# Patient Record
Sex: Male | Born: 1990 | Race: Black or African American | Hispanic: No | Marital: Single | Smoking: Former smoker
Health system: Southern US, Community
[De-identification: ages and names within clinical notes are randomized; demographics above are authoritative.]

## PROBLEM LIST (undated history)

## (undated) ENCOUNTER — Emergency Department (HOSPITAL_COMMUNITY): Admission: EM | Payer: No Typology Code available for payment source

## (undated) ENCOUNTER — Emergency Department (HOSPITAL_COMMUNITY): Admission: EM | Discharge status: Against Medical Advice | Payer: Medicaid Other

## (undated) DIAGNOSIS — J45909 Unspecified asthma, uncomplicated: Secondary | ICD-10-CM

## (undated) DIAGNOSIS — Z87828 Personal history of other (healed) physical injury and trauma: Secondary | ICD-10-CM

## (undated) DIAGNOSIS — E119 Type 2 diabetes mellitus without complications: Secondary | ICD-10-CM

## (undated) DIAGNOSIS — I10 Essential (primary) hypertension: Secondary | ICD-10-CM

## (undated) DIAGNOSIS — T148XXA Other injury of unspecified body region, initial encounter: Secondary | ICD-10-CM

## (undated) DIAGNOSIS — W3400XA Accidental discharge from unspecified firearms or gun, initial encounter: Secondary | ICD-10-CM

## (undated) DIAGNOSIS — H409 Unspecified glaucoma: Secondary | ICD-10-CM

## (undated) DIAGNOSIS — S065X9A Traumatic subdural hemorrhage with loss of consciousness of unspecified duration, initial encounter: Secondary | ICD-10-CM

## (undated) DIAGNOSIS — G35 Multiple sclerosis: Secondary | ICD-10-CM

---
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
## 2011-04-25 LAB — GLUCOSE, CAPILLARY: Glucose-Capillary: 94

## 2011-04-26 LAB — RAPID STREP SCREEN (MED CTR MEBANE ONLY): Streptococcus, Group A Screen (Direct): NEGATIVE

## 2012-04-26 ENCOUNTER — Encounter (HOSPITAL_COMMUNITY): Payer: Self-pay | Admitting: *Deleted

## 2012-04-26 ENCOUNTER — Emergency Department (HOSPITAL_COMMUNITY)
Admission: EM | Admit: 2012-04-26 | Discharge: 2012-04-26 | Discharge status: Against Medical Advice | Payer: Medicaid Other | Attending: Emergency Medicine | Admitting: Emergency Medicine

## 2012-04-26 DIAGNOSIS — R209 Unspecified disturbances of skin sensation: Secondary | ICD-10-CM | POA: Insufficient documentation

## 2012-04-26 DIAGNOSIS — S51809A Unspecified open wound of unspecified forearm, initial encounter: Secondary | ICD-10-CM | POA: Insufficient documentation

## 2012-04-26 DIAGNOSIS — E119 Type 2 diabetes mellitus without complications: Secondary | ICD-10-CM | POA: Insufficient documentation

## 2012-04-26 DIAGNOSIS — IMO0002 Reserved for concepts with insufficient information to code with codable children: Secondary | ICD-10-CM

## 2012-04-26 MED ORDER — LIDOCAINE-EPINEPHRINE-TETRACAINE (LET) SOLUTION
3.0000 mL | Freq: Once | NASAL | Status: DC
  Filled 2012-04-26: qty 3

## 2012-04-26 NOTE — ED Provider Notes (Signed)
Medical screening examination/treatment/procedure(s) were performed by non-physician practitioner and as supervising physician I was immediately available for consultation/collaboration.   Vickye Astorino Y. Gavino Fouch, MD 04/26/12 1411 
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
## 2012-04-26 NOTE — ED Notes (Signed)
Pt walked out of room. Stating he does not want to wait for papers. Provider aware. Pt amb without problem. Left with no signs of distress.
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
## 2012-04-30 ENCOUNTER — Encounter (HOSPITAL_COMMUNITY): Payer: Self-pay | Admitting: *Deleted

## 2012-04-30 ENCOUNTER — Emergency Department (INDEPENDENT_AMBULATORY_CARE_PROVIDER_SITE_OTHER)
Admission: EM | Admit: 2012-04-30 | Discharge: 2012-04-30 | Disposition: A | Discharge status: Home / Self Care | Payer: Medicaid Other | Source: Home / Self Care | Attending: Family Medicine | Admitting: Family Medicine

## 2012-04-30 ENCOUNTER — Emergency Department (HOSPITAL_COMMUNITY)
Admission: EM | Admit: 2012-04-30 | Discharge: 2012-04-30 | Discharge status: Absent without leave | Payer: Self-pay | Source: Home / Self Care

## 2012-04-30 DIAGNOSIS — S41109A Unspecified open wound of unspecified upper arm, initial encounter: Secondary | ICD-10-CM

## 2012-04-30 DIAGNOSIS — S41119A Laceration without foreign body of unspecified upper arm, initial encounter: Secondary | ICD-10-CM

## 2012-04-30 HISTORY — DX: Essential (primary) hypertension: I10

## 2012-04-30 HISTORY — DX: Unspecified glaucoma: H40.9

## 2012-04-30 NOTE — ED Notes (Signed)
Skin flap from avulsion removed per Dr. Artis Flock after local anesthetic administration.
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
## 2012-05-03 ENCOUNTER — Encounter (HOSPITAL_COMMUNITY): Payer: Self-pay | Admitting: Radiology

## 2012-05-03 ENCOUNTER — Emergency Department (HOSPITAL_COMMUNITY): Payer: Medicaid Other

## 2012-05-03 ENCOUNTER — Other Ambulatory Visit: Payer: Self-pay

## 2012-05-03 ENCOUNTER — Emergency Department (HOSPITAL_COMMUNITY)
Admission: EM | Admit: 2012-05-03 | Discharge: 2012-05-04 | Disposition: A | Discharge status: Home / Self Care | Payer: Medicaid Other | Attending: Emergency Medicine | Admitting: Emergency Medicine

## 2012-05-03 DIAGNOSIS — M542 Cervicalgia: Secondary | ICD-10-CM | POA: Insufficient documentation

## 2012-05-03 DIAGNOSIS — M549 Dorsalgia, unspecified: Secondary | ICD-10-CM | POA: Insufficient documentation

## 2012-05-03 DIAGNOSIS — Z23 Encounter for immunization: Secondary | ICD-10-CM | POA: Insufficient documentation

## 2012-05-03 DIAGNOSIS — E119 Type 2 diabetes mellitus without complications: Secondary | ICD-10-CM | POA: Insufficient documentation

## 2012-05-03 DIAGNOSIS — I10 Essential (primary) hypertension: Secondary | ICD-10-CM | POA: Insufficient documentation

## 2012-05-03 DIAGNOSIS — M25519 Pain in unspecified shoulder: Secondary | ICD-10-CM | POA: Insufficient documentation

## 2012-05-03 DIAGNOSIS — T07XXXA Unspecified multiple injuries, initial encounter: Secondary | ICD-10-CM

## 2012-05-03 LAB — GLUCOSE, CAPILLARY: Glucose-Capillary: 68 mg/dL — ABNORMAL LOW (ref 70–99)

## 2012-05-03 LAB — COMPREHENSIVE METABOLIC PANEL
ALT: 19 U/L (ref 0–53)
AST: 24 U/L (ref 0–37)
Albumin: 4.6 g/dL (ref 3.5–5.2)
CO2: 26 mEq/L (ref 19–32)
Chloride: 103 mEq/L (ref 96–112)
Glucose, Bld: 77 mg/dL (ref 70–99)
Total Protein: 8.1 g/dL (ref 6.0–8.3)

## 2012-05-03 LAB — POCT I-STAT, CHEM 8
Calcium, Ion: 1.16 mmol/L (ref 1.12–1.23)
Creatinine, Ser: 1.2 mg/dL (ref 0.50–1.35)
Glucose, Bld: 78 mg/dL (ref 70–99)
Hemoglobin: 18.4 g/dL — ABNORMAL HIGH (ref 13.0–17.0)
Sodium: 142 mEq/L (ref 135–145)
TCO2: 28 mmol/L (ref 0–100)

## 2012-05-03 LAB — PROTIME-INR
INR: 1.28 (ref 0.00–1.49)
Prothrombin Time: 15.7 seconds — ABNORMAL HIGH (ref 11.6–15.2)

## 2012-05-03 LAB — CBC
MCH: 25.6 pg — ABNORMAL LOW (ref 26.0–34.0)
MCV: 77.6 fL — ABNORMAL LOW (ref 78.0–100.0)
RDW: 13.9 % (ref 11.5–15.5)
WBC: 13.6 10*3/uL — ABNORMAL HIGH (ref 4.0–10.5)

## 2012-05-03 LAB — SAMPLE TO BLOOD BANK

## 2012-05-03 LAB — LACTIC ACID, PLASMA: Lactic Acid, Venous: 1.5 mmol/L (ref 0.5–2.2)

## 2012-05-03 MED ORDER — TETANUS-DIPHTHERIA TOXOIDS TD 5-2 LFU IM INJ
0.5000 mL | INJECTION | Freq: Once | INTRAMUSCULAR | Status: AC
  Administered 2012-05-03: 0.5 mL via INTRAMUSCULAR
  Filled 2012-05-03: qty 0.5

## 2012-05-03 MED ORDER — CEPHALEXIN 500 MG PO CAPS: 500.0000 mg | ORAL_CAPSULE | Freq: Two times a day (BID) | ORAL | Status: AC

## 2012-05-03 MED ORDER — ONDANSETRON HCL 4 MG/2ML IJ SOLN
4.0000 mg | Freq: Once | INTRAMUSCULAR | Status: AC
  Administered 2012-05-03: 4 mg via INTRAVENOUS
  Filled 2012-05-03: qty 2

## 2012-05-03 MED ORDER — IOHEXOL 300 MG/ML  SOLN
80.0000 mL | Freq: Once | INTRAMUSCULAR | Status: AC | PRN
  Administered 2012-05-03: 80 mL via INTRAVENOUS

## 2012-05-03 MED ORDER — TRAMADOL HCL 50 MG PO TABS: 50.0000 mg | ORAL_TABLET | Freq: Four times a day (QID) | ORAL | Status: DC | PRN

## 2012-05-03 MED ORDER — FENTANYL CITRATE 0.05 MG/ML IJ SOLN
25.0000 ug | Freq: Once | INTRAMUSCULAR | Status: AC
  Administered 2012-05-03: 25 ug via INTRAVENOUS
  Filled 2012-05-03: qty 2

## 2012-05-03 MED ORDER — CEFAZOLIN SODIUM 1-5 GM-% IV SOLN
1.0000 g | Freq: Once | INTRAVENOUS | Status: AC
  Administered 2012-05-03: 1 g via INTRAVENOUS
  Filled 2012-05-03: qty 50

## 2012-05-03 NOTE — ED Notes (Signed)
RN assisted pt to CT

## 2012-05-03 NOTE — Progress Notes (Signed)
Responded to trauma page. Determined that chaplain support was not needed at this time.
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
## 2012-05-04 LAB — CDS SEROLOGY

## 2012-05-05 ENCOUNTER — Emergency Department (INDEPENDENT_AMBULATORY_CARE_PROVIDER_SITE_OTHER)
Admission: EM | Admit: 2012-05-05 | Discharge: 2012-05-05 | Disposition: A | Discharge status: Home / Self Care | Payer: Medicaid Other | Source: Home / Self Care

## 2012-05-05 ENCOUNTER — Encounter (HOSPITAL_COMMUNITY): Payer: Self-pay | Admitting: *Deleted

## 2012-05-05 DIAGNOSIS — S21209A Unspecified open wound of unspecified back wall of thorax without penetration into thoracic cavity, initial encounter: Secondary | ICD-10-CM

## 2012-05-05 DIAGNOSIS — S21219A Laceration without foreign body of unspecified back wall of thorax without penetration into thoracic cavity, initial encounter: Secondary | ICD-10-CM

## 2012-05-05 HISTORY — DX: Other injury of unspecified body region, initial encounter: T14.8XXA

## 2012-05-05 MED ORDER — HYDROCODONE-ACETAMINOPHEN 5-325 MG PO TABS: 2.0000 | ORAL_TABLET | ORAL | Status: DC | PRN
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
## 2012-05-05 NOTE — ED Provider Notes (Signed)
Medical screening examination/treatment/procedure(s) were performed by resident physician or non-physician practitioner and as supervising physician I was immediately available for consultation/collaboration.   Bless Lisenby DOUGLAS MD.    Dhilan Brauer D Aviyanna Colbaugh, MD 05/05/12 1737 
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
## 2012-07-26 ENCOUNTER — Emergency Department (HOSPITAL_COMMUNITY)
Admission: EM | Admit: 2012-07-26 | Discharge: 2012-07-26 | Disposition: A | Discharge status: Home / Self Care | Payer: Medicaid Other | Attending: Emergency Medicine | Admitting: Emergency Medicine

## 2012-07-26 ENCOUNTER — Encounter (HOSPITAL_COMMUNITY): Payer: Self-pay | Admitting: Adult Health

## 2012-07-26 DIAGNOSIS — IMO0002 Reserved for concepts with insufficient information to code with codable children: Secondary | ICD-10-CM

## 2012-07-26 DIAGNOSIS — W268XXA Contact with other sharp object(s), not elsewhere classified, initial encounter: Secondary | ICD-10-CM | POA: Insufficient documentation

## 2012-07-26 DIAGNOSIS — F17200 Nicotine dependence, unspecified, uncomplicated: Secondary | ICD-10-CM | POA: Insufficient documentation

## 2012-07-26 DIAGNOSIS — E119 Type 2 diabetes mellitus without complications: Secondary | ICD-10-CM | POA: Insufficient documentation

## 2012-07-26 DIAGNOSIS — S61409A Unspecified open wound of unspecified hand, initial encounter: Secondary | ICD-10-CM | POA: Insufficient documentation

## 2012-07-26 DIAGNOSIS — Y929 Unspecified place or not applicable: Secondary | ICD-10-CM | POA: Insufficient documentation

## 2012-07-26 DIAGNOSIS — Y9339 Activity, other involving climbing, rappelling and jumping off: Secondary | ICD-10-CM | POA: Insufficient documentation

## 2012-07-26 DIAGNOSIS — I10 Essential (primary) hypertension: Secondary | ICD-10-CM | POA: Insufficient documentation

## 2012-07-26 DIAGNOSIS — Z79899 Other long term (current) drug therapy: Secondary | ICD-10-CM | POA: Insufficient documentation

## 2012-07-26 MED ORDER — IBUPROFEN 800 MG PO TABS
800.0000 mg | ORAL_TABLET | Freq: Once | ORAL | Status: AC
  Administered 2012-07-26: 800 mg via ORAL
  Filled 2012-07-26: qty 1

## 2012-07-26 MED ORDER — CEPHALEXIN 500 MG PO CAPS: 500.0000 mg | ORAL_CAPSULE | Freq: Four times a day (QID) | ORAL | Status: DC
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
## 2013-04-03 ENCOUNTER — Encounter (HOSPITAL_COMMUNITY): Payer: Self-pay | Admitting: Emergency Medicine

## 2013-04-03 ENCOUNTER — Emergency Department (HOSPITAL_COMMUNITY)
Admission: EM | Admit: 2013-04-03 | Discharge: 2013-04-04 | Disposition: A | Discharge status: Home / Self Care | Payer: Medicaid Other | Attending: Emergency Medicine | Admitting: Emergency Medicine

## 2013-04-03 DIAGNOSIS — F17200 Nicotine dependence, unspecified, uncomplicated: Secondary | ICD-10-CM | POA: Insufficient documentation

## 2013-04-03 DIAGNOSIS — I10 Essential (primary) hypertension: Secondary | ICD-10-CM | POA: Insufficient documentation

## 2013-04-03 DIAGNOSIS — Z8669 Personal history of other diseases of the nervous system and sense organs: Secondary | ICD-10-CM | POA: Insufficient documentation

## 2013-04-03 DIAGNOSIS — E119 Type 2 diabetes mellitus without complications: Secondary | ICD-10-CM | POA: Insufficient documentation

## 2013-04-03 DIAGNOSIS — Z87828 Personal history of other (healed) physical injury and trauma: Secondary | ICD-10-CM | POA: Insufficient documentation

## 2013-04-03 DIAGNOSIS — M25569 Pain in unspecified knee: Secondary | ICD-10-CM | POA: Insufficient documentation

## 2013-04-03 DIAGNOSIS — Z79899 Other long term (current) drug therapy: Secondary | ICD-10-CM | POA: Insufficient documentation

## 2013-04-03 DIAGNOSIS — R531 Weakness: Secondary | ICD-10-CM

## 2013-04-03 DIAGNOSIS — M25561 Pain in right knee: Secondary | ICD-10-CM

## 2013-04-03 MED ORDER — IBUPROFEN 600 MG PO TABS: 600.0000 mg | ORAL_TABLET | Freq: Three times a day (TID) | ORAL | Status: DC | PRN

## 2013-04-03 NOTE — ED Notes (Signed)
Physician d/c patient but patient didn't wait around to sign documentation, patient wanted medication other than ibuprofen 600 mg,
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
## 2013-04-27 ENCOUNTER — Emergency Department (HOSPITAL_COMMUNITY)
Admission: EM | Admit: 2013-04-27 | Discharge: 2013-04-27 | Disposition: A | Discharge status: Home / Self Care | Payer: Medicaid Other | Attending: Emergency Medicine | Admitting: Emergency Medicine

## 2013-04-27 ENCOUNTER — Encounter (HOSPITAL_COMMUNITY): Payer: Self-pay | Admitting: *Deleted

## 2013-04-27 DIAGNOSIS — F17200 Nicotine dependence, unspecified, uncomplicated: Secondary | ICD-10-CM | POA: Insufficient documentation

## 2013-04-27 DIAGNOSIS — Z8669 Personal history of other diseases of the nervous system and sense organs: Secondary | ICD-10-CM | POA: Insufficient documentation

## 2013-04-27 DIAGNOSIS — E119 Type 2 diabetes mellitus without complications: Secondary | ICD-10-CM | POA: Insufficient documentation

## 2013-04-27 DIAGNOSIS — Z79899 Other long term (current) drug therapy: Secondary | ICD-10-CM | POA: Insufficient documentation

## 2013-04-27 DIAGNOSIS — IMO0001 Reserved for inherently not codable concepts without codable children: Secondary | ICD-10-CM | POA: Insufficient documentation

## 2013-04-27 DIAGNOSIS — I10 Essential (primary) hypertension: Secondary | ICD-10-CM | POA: Insufficient documentation

## 2013-04-27 DIAGNOSIS — M25561 Pain in right knee: Secondary | ICD-10-CM

## 2013-04-27 DIAGNOSIS — Z87828 Personal history of other (healed) physical injury and trauma: Secondary | ICD-10-CM | POA: Insufficient documentation

## 2013-04-27 DIAGNOSIS — G8911 Acute pain due to trauma: Secondary | ICD-10-CM | POA: Insufficient documentation

## 2013-04-27 DIAGNOSIS — M25569 Pain in unspecified knee: Secondary | ICD-10-CM | POA: Insufficient documentation

## 2013-04-27 MED ORDER — MELOXICAM 7.5 MG PO TABS: 15.0000 mg | ORAL_TABLET | Freq: Every day | ORAL | Status: DC

## 2013-04-27 MED ORDER — KETOROLAC TROMETHAMINE 60 MG/2ML IM SOLN
60.0000 mg | Freq: Once | INTRAMUSCULAR | Status: AC
  Administered 2013-04-27: 60 mg via INTRAMUSCULAR
  Filled 2013-04-27: qty 2
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
## 2013-04-28 NOTE — ED Provider Notes (Signed)
Medical screening examination/treatment/procedure(s) were performed by non-physician practitioner and as supervising physician I was immediately available for consultation/collaboration.   [employer]an Base M Justice Milliron, MD 04/28/13 1239 

## 2013-09-22 DIAGNOSIS — W3400XA Accidental discharge from unspecified firearms or gun, initial encounter: Secondary | ICD-10-CM

## 2013-09-22 HISTORY — PX: FEMORAL-POPLITEAL BYPASS GRAFT: SHX937

## 2013-09-22 HISTORY — PX: FASCIOTOMY: SHX132

## 2013-09-22 HISTORY — DX: Accidental discharge from unspecified firearms or gun, initial encounter: W34.00XA

## 2013-09-22 HISTORY — PX: FASCIOTOMY CLOSURE: SHX5829

## 2013-09-24 ENCOUNTER — Inpatient Hospital Stay (HOSPITAL_COMMUNITY)
Admission: EM | Admit: 2013-09-24 | Discharge: 2013-10-03 | DRG: 908 | Disposition: A | Discharge status: Home / Self Care | Payer: Medicaid Other | Attending: General Surgery | Admitting: General Surgery

## 2013-09-24 ENCOUNTER — Encounter (HOSPITAL_COMMUNITY): Payer: Self-pay | Admitting: Emergency Medicine

## 2013-09-24 ENCOUNTER — Encounter (HOSPITAL_COMMUNITY): Admission: EM | Disposition: A | Discharge status: Home / Self Care | Payer: Self-pay | Source: Home / Self Care

## 2013-09-24 ENCOUNTER — Inpatient Hospital Stay (HOSPITAL_COMMUNITY): Payer: Medicaid Other | Admitting: Anesthesiology

## 2013-09-24 ENCOUNTER — Emergency Department (HOSPITAL_COMMUNITY): Payer: Medicaid Other

## 2013-09-24 ENCOUNTER — Encounter (HOSPITAL_COMMUNITY): Payer: Medicaid Other | Admitting: Anesthesiology

## 2013-09-24 DIAGNOSIS — S71109A Unspecified open wound, unspecified thigh, initial encounter: Secondary | ICD-10-CM

## 2013-09-24 DIAGNOSIS — D62 Acute posthemorrhagic anemia: Secondary | ICD-10-CM | POA: Diagnosis not present

## 2013-09-24 DIAGNOSIS — F17200 Nicotine dependence, unspecified, uncomplicated: Secondary | ICD-10-CM | POA: Diagnosis present

## 2013-09-24 DIAGNOSIS — E119 Type 2 diabetes mellitus without complications: Secondary | ICD-10-CM | POA: Diagnosis present

## 2013-09-24 DIAGNOSIS — W3400XA Accidental discharge from unspecified firearms or gun, initial encounter: Secondary | ICD-10-CM

## 2013-09-24 DIAGNOSIS — S75009A Unspecified injury of femoral artery, unspecified leg, initial encounter: Principal | ICD-10-CM | POA: Diagnosis present

## 2013-09-24 DIAGNOSIS — F209 Schizophrenia, unspecified: Secondary | ICD-10-CM | POA: Diagnosis present

## 2013-09-24 DIAGNOSIS — T148XXA Other injury of unspecified body region, initial encounter: Secondary | ICD-10-CM | POA: Diagnosis present

## 2013-09-24 DIAGNOSIS — S71009A Unspecified open wound, unspecified hip, initial encounter: Secondary | ICD-10-CM

## 2013-09-24 DIAGNOSIS — F1210 Cannabis abuse, uncomplicated: Secondary | ICD-10-CM | POA: Diagnosis present

## 2013-09-24 DIAGNOSIS — G5791 Unspecified mononeuropathy of right lower limb: Secondary | ICD-10-CM | POA: Diagnosis present

## 2013-09-24 DIAGNOSIS — I10 Essential (primary) hypertension: Secondary | ICD-10-CM | POA: Diagnosis present

## 2013-09-24 HISTORY — PX: FEMORAL-POPLITEAL BYPASS GRAFT: SHX937

## 2013-09-24 HISTORY — PX: FASCIOTOMY: SHX132

## 2013-09-24 HISTORY — DX: Accidental discharge from unspecified firearms or gun, initial encounter: W34.00XA

## 2013-09-24 LAB — PROTIME-INR
INR: 1.48 (ref 0.00–1.49)
Prothrombin Time: 17.5 seconds — ABNORMAL HIGH (ref 11.6–15.2)

## 2013-09-24 LAB — CBG MONITORING, ED: Glucose-Capillary: 106 mg/dL — ABNORMAL HIGH (ref 70–99)

## 2013-09-24 LAB — CBC
HCT: 44.4 % (ref 39.0–52.0)
Hemoglobin: 15 g/dL (ref 13.0–17.0)
MCH: 26.6 pg (ref 26.0–34.0)
MCHC: 33.8 g/dL (ref 30.0–36.0)
MCV: 78.9 fL (ref 78.0–100.0)
PLATELETS: 345 10*3/uL (ref 150–400)
RBC: 5.63 MIL/uL (ref 4.22–5.81)
RDW: 13.5 % (ref 11.5–15.5)
WBC: 13.3 10*3/uL — ABNORMAL HIGH (ref 4.0–10.5)

## 2013-09-24 LAB — COMPREHENSIVE METABOLIC PANEL
ALBUMIN: 4.1 g/dL (ref 3.5–5.2)
ALK PHOS: 59 U/L (ref 39–117)
ALT: 16 U/L (ref 0–53)
AST: 24 U/L (ref 0–37)
BILIRUBIN TOTAL: 0.4 mg/dL (ref 0.3–1.2)
BUN: 9 mg/dL (ref 6–23)
CHLORIDE: 105 meq/L (ref 96–112)
CO2: 23 meq/L (ref 19–32)
Calcium: 9.2 mg/dL (ref 8.4–10.5)
Creatinine, Ser: 1.19 mg/dL (ref 0.50–1.35)
GFR calc Af Amer: >90 mL/min (ref >90)
GFR calc non Af Amer: 86 mL/min — ABNORMAL LOW (ref >90)
Glucose, Bld: 134 mg/dL — ABNORMAL HIGH (ref 70–99)
POTASSIUM: 3.9 meq/L (ref 3.7–5.3)
Sodium: 144 mEq/L (ref 137–147)
Total Protein: 7.2 g/dL (ref 6.0–8.3)

## 2013-09-24 LAB — CDS SEROLOGY

## 2013-09-24 LAB — SAMPLE TO BLOOD BANK

## 2013-09-24 SURGERY — BYPASS GRAFT FEMORAL-POPLITEAL ARTERY
Anesthesia: General | Site: Leg Upper | Laterality: Right

## 2013-09-24 MED ORDER — SODIUM CHLORIDE 0.9 % IV SOLN
1000.0000 mL | INTRAVENOUS | Status: DC
  Administered 2013-09-24: 1000 mL via INTRAVENOUS

## 2013-09-24 MED ORDER — NEOSTIGMINE METHYLSULFATE 1 MG/ML IJ SOLN
INTRAMUSCULAR | Status: AC
  Filled 2013-09-24: qty 10

## 2013-09-24 MED ORDER — ONDANSETRON HCL 4 MG/2ML IJ SOLN: 4.0000 mg | Freq: Four times a day (QID) | INTRAMUSCULAR | Status: DC | PRN

## 2013-09-24 MED ORDER — HEPARIN SODIUM (PORCINE) 5000 UNIT/ML IJ SOLN
INTRAMUSCULAR | Status: DC | PRN
  Administered 2013-09-24: 22:00:00

## 2013-09-24 MED ORDER — MIDAZOLAM HCL 5 MG/5ML IJ SOLN
INTRAMUSCULAR | Status: DC | PRN
  Administered 2013-09-24: 2 mg via INTRAVENOUS

## 2013-09-24 MED ORDER — ARTIFICIAL TEARS OP OINT
TOPICAL_OINTMENT | OPHTHALMIC | Status: DC | PRN
  Administered 2013-09-24: 1 via OPHTHALMIC

## 2013-09-24 MED ORDER — 0.9 % SODIUM CHLORIDE (POUR BTL) OPTIME
TOPICAL | Status: DC | PRN
  Administered 2013-09-24: 1000 mL

## 2013-09-24 MED ORDER — SODIUM CHLORIDE 0.9 % IV SOLN
INTRAVENOUS | Status: DC | PRN
  Administered 2013-09-24: 22:00:00 via INTRAVENOUS

## 2013-09-24 MED ORDER — DEXTROSE 5 % IV SOLN
INTRAVENOUS | Status: DC | PRN
  Administered 2013-09-24: 22:00:00 via INTRAVENOUS

## 2013-09-24 MED ORDER — PHENYLEPHRINE 40 MCG/ML (10ML) SYRINGE FOR IV PUSH (FOR BLOOD PRESSURE SUPPORT)
PREFILLED_SYRINGE | INTRAVENOUS | Status: AC
  Filled 2013-09-24: qty 10

## 2013-09-24 MED ORDER — HYDROMORPHONE 0.3 MG/ML IV SOLN
INTRAVENOUS | Status: DC
  Administered 2013-09-25: 03:00:00 via INTRAVENOUS
  Administered 2013-09-25: 2.7 mg via INTRAVENOUS
  Administered 2013-09-25: 18:00:00 via INTRAVENOUS
  Administered 2013-09-25: 1.5 mg via INTRAVENOUS
  Administered 2013-09-25: 2.1 mg via INTRAVENOUS
  Administered 2013-09-25: 0.9 mg via INTRAVENOUS
  Administered 2013-09-26: 3.9 mg via INTRAVENOUS
  Administered 2013-09-26: 05:00:00 via INTRAVENOUS
  Administered 2013-09-26: 2.1 mg via INTRAVENOUS
  Filled 2013-09-24 (×2): qty 25

## 2013-09-24 MED ORDER — CEFAZOLIN SODIUM-DEXTROSE 2-3 GM-% IV SOLR
INTRAVENOUS | Status: DC | PRN
  Administered 2013-09-24: 2 g via INTRAVENOUS

## 2013-09-24 MED ORDER — LACTATED RINGERS IV SOLN
INTRAVENOUS | Status: DC | PRN
  Administered 2013-09-24 – 2013-09-25 (×2): via INTRAVENOUS

## 2013-09-24 MED ORDER — SODIUM CHLORIDE 0.9 % IV SOLN
10.0000 mg | INTRAVENOUS | Status: DC | PRN
  Administered 2013-09-24: 15 ug/min via INTRAVENOUS

## 2013-09-24 MED ORDER — SODIUM CHLORIDE 0.9 % IJ SOLN: 9.0000 mL | INTRAMUSCULAR | Status: DC | PRN

## 2013-09-24 MED ORDER — LIDOCAINE HCL (CARDIAC) 20 MG/ML IV SOLN
INTRAVENOUS | Status: AC
  Filled 2013-09-24: qty 5

## 2013-09-24 MED ORDER — FENTANYL CITRATE 0.05 MG/ML IJ SOLN
50.0000 ug | Freq: Once | INTRAMUSCULAR | Status: AC
  Administered 2013-09-24: 50 ug via INTRAVENOUS
  Filled 2013-09-24: qty 2

## 2013-09-24 MED ORDER — PROPOFOL 10 MG/ML IV BOLUS
INTRAVENOUS | Status: DC | PRN
  Administered 2013-09-24: 200 mg via INTRAVENOUS

## 2013-09-24 MED ORDER — ALBUMIN HUMAN 5 % IV SOLN
INTRAVENOUS | Status: DC | PRN
  Administered 2013-09-24: 23:00:00 via INTRAVENOUS

## 2013-09-24 MED ORDER — FENTANYL CITRATE 0.05 MG/ML IJ SOLN
INTRAMUSCULAR | Status: AC
  Filled 2013-09-24: qty 5

## 2013-09-24 MED ORDER — THROMBIN 20000 UNITS EX SOLR
CUTANEOUS | Status: AC
  Filled 2013-09-24: qty 20000

## 2013-09-24 MED ORDER — PANTOPRAZOLE SODIUM 40 MG PO TBEC
40.0000 mg | DELAYED_RELEASE_TABLET | Freq: Every day | ORAL | Status: DC
  Administered 2013-09-25 – 2013-09-26 (×2): 40 mg via ORAL
  Filled 2013-09-24 (×2): qty 1

## 2013-09-24 MED ORDER — SODIUM CHLORIDE 0.9 % IV SOLN
1000.0000 mL | Freq: Once | INTRAVENOUS | Status: AC
  Administered 2013-09-24: 1000 mL via INTRAVENOUS

## 2013-09-24 MED ORDER — ONDANSETRON HCL 4 MG/2ML IJ SOLN
INTRAMUSCULAR | Status: DC | PRN
  Administered 2013-09-24: 4 mg via INTRAVENOUS

## 2013-09-24 MED ORDER — FENTANYL CITRATE 0.05 MG/ML IJ SOLN
INTRAMUSCULAR | Status: DC | PRN
Start: 2013-09-24 — End: 2013-09-25
  Administered 2013-09-24: 100 ug via INTRAVENOUS
  Administered 2013-09-24 – 2013-09-25 (×6): 50 ug via INTRAVENOUS

## 2013-09-24 MED ORDER — NALOXONE HCL 0.4 MG/ML IJ SOLN: 0.4000 mg | INTRAMUSCULAR | Status: DC | PRN

## 2013-09-24 MED ORDER — LACTATED RINGERS IV SOLN: INTRAVENOUS | Status: DC | PRN

## 2013-09-24 MED ORDER — GLYCOPYRROLATE 0.2 MG/ML IJ SOLN
INTRAMUSCULAR | Status: AC
  Filled 2013-09-24: qty 3

## 2013-09-24 MED ORDER — ENOXAPARIN SODIUM 40 MG/0.4ML ~~LOC~~ SOLN
40.0000 mg | SUBCUTANEOUS | Status: DC
  Administered 2013-09-25 – 2013-09-26 (×2): 40 mg via SUBCUTANEOUS
  Filled 2013-09-24 (×3): qty 0.4

## 2013-09-24 MED ORDER — HEPARIN SODIUM (PORCINE) 1000 UNIT/ML IJ SOLN
INTRAMUSCULAR | Status: DC | PRN
  Administered 2013-09-24: 2000 [IU] via INTRAVENOUS
  Administered 2013-09-24: 7000 [IU] via INTRAVENOUS

## 2013-09-24 MED ORDER — TETANUS-DIPHTH-ACELL PERTUSSIS 5-2.5-18.5 LF-MCG/0.5 IM SUSP
0.5000 mL | Freq: Once | INTRAMUSCULAR | Status: DC
  Filled 2013-09-24: qty 0.5

## 2013-09-24 MED ORDER — DEXTROSE-NACL 5-0.9 % IV SOLN
INTRAVENOUS | Status: DC
  Administered 2013-09-25: 05:00:00 via INTRAVENOUS

## 2013-09-24 MED ORDER — FENTANYL CITRATE 0.05 MG/ML IJ SOLN: 50.0000 ug | Freq: Once | INTRAMUSCULAR | Status: DC

## 2013-09-24 MED ORDER — ONDANSETRON HCL 4 MG PO TABS: 4.0000 mg | ORAL_TABLET | Freq: Four times a day (QID) | ORAL | Status: DC | PRN

## 2013-09-24 MED ORDER — CEFAZOLIN SODIUM-DEXTROSE 2-3 GM-% IV SOLR
INTRAVENOUS | Status: AC
  Filled 2013-09-24: qty 50

## 2013-09-24 MED ORDER — ARTIFICIAL TEARS OP OINT
TOPICAL_OINTMENT | OPHTHALMIC | Status: AC
  Filled 2013-09-24: qty 3.5

## 2013-09-24 MED ORDER — PANTOPRAZOLE SODIUM 40 MG IV SOLR
40.0000 mg | Freq: Every day | INTRAVENOUS | Status: DC
  Filled 2013-09-24 (×3): qty 40

## 2013-09-24 MED ORDER — MIDAZOLAM HCL 2 MG/2ML IJ SOLN
INTRAMUSCULAR | Status: AC
  Filled 2013-09-24: qty 2

## 2013-09-24 MED ORDER — IOHEXOL 350 MG/ML SOLN
100.0000 mL | Freq: Once | INTRAVENOUS | Status: AC | PRN
  Administered 2013-09-24: 100 mL via INTRAVENOUS

## 2013-09-24 MED ORDER — SUCCINYLCHOLINE CHLORIDE 20 MG/ML IJ SOLN
INTRAMUSCULAR | Status: DC | PRN
  Administered 2013-09-24: 120 mg via INTRAVENOUS

## 2013-09-24 MED ORDER — ROCURONIUM BROMIDE 100 MG/10ML IV SOLN
INTRAVENOUS | Status: DC | PRN
  Administered 2013-09-24 (×2): 20 mg via INTRAVENOUS
  Administered 2013-09-24: 30 mg via INTRAVENOUS
  Administered 2013-09-24: 20 mg via INTRAVENOUS

## 2013-09-24 SURGICAL SUPPLY — 84 items
ADH SKN CLS APL DERMABOND .7 (GAUZE/BANDAGES/DRESSINGS) ×2
ADH SKN CLS LQ APL DERMABOND (GAUZE/BANDAGES/DRESSINGS) ×4
BAG ISL DRAPE 18X18 STRL (DRAPES) ×2
BAG ISOLATION DRAPE 18X18 (DRAPES) IMPLANT
BANDAGE ELASTIC 4 VELCRO ST LF (GAUZE/BANDAGES/DRESSINGS) IMPLANT
BANDAGE ESMARK 6X9 LF (GAUZE/BANDAGES/DRESSINGS) IMPLANT
BNDG CMPR 9X6 STRL LF SNTH (GAUZE/BANDAGES/DRESSINGS)
BNDG ESMARK 6X9 LF (GAUZE/BANDAGES/DRESSINGS)
CANISTER SUCTION 2500CC (MISCELLANEOUS) ×4 IMPLANT
CATH EMB 3FR 80CM (CATHETERS) ×4 IMPLANT
CATH EMB 4FR 80CM (CATHETERS) ×2 IMPLANT
CLIP TI MEDIUM 24 (CLIP) ×4 IMPLANT
CLIP TI WIDE RED SMALL 24 (CLIP) ×4 IMPLANT
CONNECTOR Y ATS VAC SYSTEM (MISCELLANEOUS) ×2 IMPLANT
COVER PROBE W GEL 5X96 (DRAPES) ×2 IMPLANT
COVER SURGICAL LIGHT HANDLE (MISCELLANEOUS) ×4 IMPLANT
CUFF TOURNIQUET SINGLE 24IN (TOURNIQUET CUFF) IMPLANT
CUFF TOURNIQUET SINGLE 34IN LL (TOURNIQUET CUFF) IMPLANT
CUFF TOURNIQUET SINGLE 44IN (TOURNIQUET CUFF) IMPLANT
DERMABOND ADHESIVE PROPEN (GAUZE/BANDAGES/DRESSINGS) ×4
DERMABOND ADVANCED (GAUZE/BANDAGES/DRESSINGS) ×2
DERMABOND ADVANCED .7 DNX12 (GAUZE/BANDAGES/DRESSINGS) ×2 IMPLANT
DERMABOND ADVANCED .7 DNX6 (GAUZE/BANDAGES/DRESSINGS) ×4 IMPLANT
DRAIN CHANNEL 15F RND FF W/TCR (WOUND CARE) IMPLANT
DRAPE INCISE IOBAN 66X45 STRL (DRAPES) ×2 IMPLANT
DRAPE ISOLATION BAG 18X18 (DRAPES) ×2
DRAPE WARM FLUID 44X44 (DRAPE) ×4 IMPLANT
DRAPE X-RAY CASS 24X20 (DRAPES) IMPLANT
DRSG COVADERM 4X10 (GAUZE/BANDAGES/DRESSINGS) IMPLANT
DRSG COVADERM 4X8 (GAUZE/BANDAGES/DRESSINGS) ×4 IMPLANT
DRSG VAC ATS LRG SENSATRAC (GAUZE/BANDAGES/DRESSINGS) ×2 IMPLANT
DRSG VAC ATS MED SENSATRAC (GAUZE/BANDAGES/DRESSINGS) ×3 IMPLANT
ELECT REM PT RETURN 9FT ADLT (ELECTROSURGICAL) ×4
ELECTRODE REM PT RTRN 9FT ADLT (ELECTROSURGICAL) ×2 IMPLANT
EVACUATOR SILICONE 100CC (DRAIN) IMPLANT
GLOVE BIO SURGEON STRL SZ 6.5 (GLOVE) ×6 IMPLANT
GLOVE BIO SURGEON STRL SZ7.5 (GLOVE) ×2 IMPLANT
GLOVE BIO SURGEONS STRL SZ 6.5 (GLOVE) ×3
GLOVE BIOGEL PI IND STRL 6.5 (GLOVE) IMPLANT
GLOVE BIOGEL PI IND STRL 7.0 (GLOVE) IMPLANT
GLOVE BIOGEL PI IND STRL 7.5 (GLOVE) ×2 IMPLANT
GLOVE BIOGEL PI INDICATOR 6.5 (GLOVE) ×2
GLOVE BIOGEL PI INDICATOR 7.0 (GLOVE) ×4
GLOVE BIOGEL PI INDICATOR 7.5 (GLOVE) ×10
GLOVE SURG SS PI 7.5 STRL IVOR (GLOVE) ×10 IMPLANT
GOWN STRL REUS W/ TWL LRG LVL3 (GOWN DISPOSABLE) ×4 IMPLANT
GOWN STRL REUS W/ TWL XL LVL3 (GOWN DISPOSABLE) ×2 IMPLANT
GOWN STRL REUS W/TWL LRG LVL3 (GOWN DISPOSABLE) ×8
GOWN STRL REUS W/TWL XL LVL3 (GOWN DISPOSABLE) ×4
HEMOSTAT SNOW SURGICEL 2X4 (HEMOSTASIS) IMPLANT
KIT BASIN OR (CUSTOM PROCEDURE TRAY) ×4 IMPLANT
KIT ROOM TURNOVER OR (KITS) ×4 IMPLANT
MARKER GRAFT CORONARY BYPASS (MISCELLANEOUS) IMPLANT
NS IRRIG 1000ML POUR BTL (IV SOLUTION) ×8 IMPLANT
PACK PERIPHERAL VASCULAR (CUSTOM PROCEDURE TRAY) ×4 IMPLANT
PAD ARMBOARD 7.5X6 YLW CONV (MISCELLANEOUS) ×8 IMPLANT
PAD NEG PRESSURE SENSATRAC (MISCELLANEOUS) ×4 IMPLANT
PADDING CAST COTTON 6X4 STRL (CAST SUPPLIES) ×2 IMPLANT
SET COLLECT BLD 21X3/4 12 (NEEDLE) IMPLANT
SPONGE LAP 18X18 X RAY DECT (DISPOSABLE) ×4 IMPLANT
STAPLER VISISTAT (STAPLE) ×2 IMPLANT
STOPCOCK 4 WAY LG BORE MALE ST (IV SETS) IMPLANT
SUT ETHILON 3 0 PS 1 (SUTURE) IMPLANT
SUT PROLENE 5 0 C 1 24 (SUTURE) ×4 IMPLANT
SUT PROLENE 6 0 BV (SUTURE) ×14 IMPLANT
SUT PROLENE 7 0 BV 1 (SUTURE) IMPLANT
SUT SILK 2 0 SH (SUTURE) ×4 IMPLANT
SUT SILK 3 0 (SUTURE) ×4
SUT SILK 3-0 18XBRD TIE 12 (SUTURE) IMPLANT
SUT VIC AB 2-0 CT1 27 (SUTURE) ×16
SUT VIC AB 2-0 CT1 TAPERPNT 27 (SUTURE) ×4 IMPLANT
SUT VIC AB 3-0 SH 27 (SUTURE) ×8
SUT VIC AB 3-0 SH 27X BRD (SUTURE) ×4 IMPLANT
SUT VICRYL 4-0 PS2 18IN ABS (SUTURE) ×11 IMPLANT
SYR 3ML LL SCALE MARK (SYRINGE) ×2 IMPLANT
SYR 5ML LL (SYRINGE) ×2 IMPLANT
SYR TB 1ML LUER SLIP (SYRINGE) ×2 IMPLANT
TOWEL OR 17X24 6PK STRL BLUE (TOWEL DISPOSABLE) ×8 IMPLANT
TOWEL OR 17X26 10 PK STRL BLUE (TOWEL DISPOSABLE) ×8 IMPLANT
TRAY FOLEY CATH 16FRSI W/METER (SET/KITS/TRAYS/PACK) ×4 IMPLANT
TUBING EXTENTION W/L.L. (IV SETS) IMPLANT
UNDERPAD 30X30 INCONTINENT (UNDERPADS AND DIAPERS) ×4 IMPLANT
WATER STERILE IRR 1000ML POUR (IV SOLUTION) ×4 IMPLANT
YANKAUER SUCT BULB TIP NO VENT (SUCTIONS) ×3 IMPLANT

## 2013-09-24 NOTE — H&P (Signed)
a 

## 2013-09-24 NOTE — ED Notes (Signed)
Pt to OR.

## 2013-09-24 NOTE — Progress Notes (Signed)
Discussed case with Dr Myra Gianotti and Dr Lajoyce Corners. Pt getting CTA of RLE.  May have nerve injury as well by exam.  Will need nerve conduction studies at a later time to see if nerve contused  Or disrupted.  If disrupted,  Would need repair at later time by hand surgeon or eqivalent.  Will take 6 months to heal if contusion.
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
## 2013-09-24 NOTE — Anesthesia Procedure Notes (Signed)
Procedure Name: Intubation Date/Time: 09/24/2013 9:41 PM Performed by: Wray KearnsFOLEY, Khalis Hittle A Pre-anesthesia Checklist: Patient identified, Timeout performed, Emergency Drugs available, Suction available and Patient being monitored Patient Re-evaluated:Patient Re-evaluated prior to inductionOxygen Delivery Method: Circle system utilized Preoxygenation: Pre-oxygenation with 100% oxygen Intubation Type: IV induction, Rapid sequence and Cricoid Pressure applied Laryngoscope Size: Mac and 4 Grade View: Grade I Tube type: Oral Tube size: 7.5 mm Number of attempts: 1 Airway Equipment and Method: Stylet Placement Confirmation: ETT inserted through vocal cords under direct vision,  breath sounds checked- equal and bilateral and positive ETCO2 Secured at: 23 cm Tube secured with: Tape Dental Injury: Teeth and Oropharynx as per pre-operative assessment
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
## 2013-09-24 NOTE — Anesthesia Preprocedure Evaluation (Addendum)
Anesthesia Evaluation  Patient identified by MRN, date of birth, ID band Patient awake  General Assessment Comment:Pt beligerent  Reviewed: Allergy & Precautions, H&P , NPO status , Patient's Chart, lab work & pertinent test results  History of Anesthesia Complications Negative for: history of anesthetic complications  Airway Mallampati: II TM Distance: >3 FB Neck ROM: Full    Dental  (+) Teeth Intact, Dental Advisory Given   Pulmonary Current Smoker,  breath sounds clear to auscultation  Pulmonary exam normal       Cardiovascular hypertension, Pt. on medications Rhythm:Regular Rate:Normal     Neuro/Psych negative neurological ROS     GI/Hepatic negative GI ROS, (+)     substance abuse  alcohol use and marijuana use,   Endo/Other  diabetes (glu 106), Oral Hypoglycemic AgentsMorbid obesity  Renal/GU negative Renal ROS     Musculoskeletal   Abdominal (+) + obese,   Peds  Hematology   Anesthesia Other Findings   Reproductive/Obstetrics                          Anesthesia Physical Anesthesia Plan  ASA: III and emergent  Anesthesia Plan: General   Post-op Pain Management:    Induction: Intravenous, Rapid sequence and Cricoid pressure planned  Airway Management Planned: Oral ETT  Additional Equipment:   Intra-op Plan:   Post-operative Plan: Extubation in OR  Informed Consent: I have reviewed the patients History and Physical, chart, labs and discussed the procedure including the risks, benefits and alternatives for the proposed anesthesia with the patient or authorized representative who has indicated his/her understanding and acceptance.   Dental advisory given  Plan Discussed with: CRNA and Surgeon  Anesthesia Plan Comments: (Plan routine monitors, GETA with RSI)        Anesthesia Quick Evaluation
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
## 2013-09-24 NOTE — ED Notes (Signed)
Pt return from ct scan. Pt alert x4 respirations easy non labored
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
## 2013-09-25 DIAGNOSIS — I10 Essential (primary) hypertension: Secondary | ICD-10-CM

## 2013-09-25 DIAGNOSIS — E119 Type 2 diabetes mellitus without complications: Secondary | ICD-10-CM

## 2013-09-25 LAB — GLUCOSE, CAPILLARY
Glucose-Capillary: 92 mg/dL (ref 70–99)
Glucose-Capillary: 105 mg/dL — ABNORMAL HIGH (ref 70–99)
Glucose-Capillary: 102 mg/dL — ABNORMAL HIGH (ref 70–99)
Glucose-Capillary: 101 mg/dL — ABNORMAL HIGH (ref 70–99)

## 2013-09-25 LAB — COMPREHENSIVE METABOLIC PANEL
ALBUMIN: 3.2 g/dL — AB (ref 3.5–5.2)
ALT: 10 U/L (ref 0–53)
AST: 19 U/L (ref 0–37)
Alkaline Phosphatase: 40 U/L (ref 39–117)
BILIRUBIN TOTAL: 0.5 mg/dL (ref 0.3–1.2)
BUN: 8 mg/dL (ref 6–23)
CHLORIDE: 107 meq/L (ref 96–112)
CO2: 26 mEq/L (ref 19–32)
CREATININE: 0.96 mg/dL (ref 0.50–1.35)
Calcium: 8.3 mg/dL — ABNORMAL LOW (ref 8.4–10.5)
GFR calc Af Amer: >90 mL/min (ref >90)
Glucose, Bld: 115 mg/dL — ABNORMAL HIGH (ref 70–99)
Potassium: 4.3 mEq/L (ref 3.7–5.3)
Sodium: 143 mEq/L (ref 137–147)
Total Protein: 5.3 g/dL — ABNORMAL LOW (ref 6.0–8.3)

## 2013-09-25 LAB — CBC
HEMATOCRIT: 30.8 % — AB (ref 39.0–52.0)
Hemoglobin: 10.3 g/dL — ABNORMAL LOW (ref 13.0–17.0)
MCH: 26.3 pg (ref 26.0–34.0)
MCHC: 33.4 g/dL (ref 30.0–36.0)
MCV: 78.6 fL (ref 78.0–100.0)
Platelets: 216 10*3/uL (ref 150–400)
RBC: 3.92 MIL/uL — ABNORMAL LOW (ref 4.22–5.81)
RDW: 13.4 % (ref 11.5–15.5)
WBC: 17.1 10*3/uL — AB (ref 4.0–10.5)

## 2013-09-25 LAB — MRSA PCR SCREENING: MRSA by PCR: NEGATIVE

## 2013-09-25 MED ORDER — GLYCOPYRROLATE 0.2 MG/ML IJ SOLN
INTRAMUSCULAR | Status: DC | PRN
  Administered 2013-09-25: 0.6 mg via INTRAVENOUS

## 2013-09-25 MED ORDER — MEPERIDINE HCL 25 MG/ML IJ SOLN: 6.2500 mg | INTRAMUSCULAR | Status: DC | PRN

## 2013-09-25 MED ORDER — HYDROMORPHONE HCL PF 1 MG/ML IJ SOLN: 0.2500 mg | INTRAMUSCULAR | Status: DC | PRN

## 2013-09-25 MED ORDER — OXYCODONE HCL 5 MG/5ML PO SOLN: 5.0000 mg | Freq: Once | ORAL | Status: AC | PRN

## 2013-09-25 MED ORDER — SODIUM CHLORIDE 0.9 % IV SOLN
1000.0000 mL | INTRAVENOUS | Status: DC
  Administered 2013-09-25 – 2013-09-26 (×3): 1000 mL via INTRAVENOUS

## 2013-09-25 MED ORDER — HYDROMORPHONE 0.3 MG/ML IV SOLN
INTRAVENOUS | Status: AC
  Filled 2013-09-25: qty 25

## 2013-09-25 MED ORDER — PROMETHAZINE HCL 25 MG/ML IJ SOLN: 6.2500 mg | INTRAMUSCULAR | Status: DC | PRN

## 2013-09-25 MED ORDER — OXYCODONE HCL 5 MG PO TABS
10.0000 mg | ORAL_TABLET | ORAL | Status: DC | PRN
  Administered 2013-09-25 – 2013-09-27 (×8): 10 mg via ORAL
  Filled 2013-09-25 (×8): qty 2

## 2013-09-25 MED ORDER — OXYCODONE HCL 5 MG PO TABS
5.0000 mg | ORAL_TABLET | Freq: Once | ORAL | Status: AC | PRN
  Administered 2013-09-25: 5 mg via ORAL
  Filled 2013-09-25: qty 1

## 2013-09-25 MED ORDER — MIDAZOLAM HCL 2 MG/2ML IJ SOLN: 0.5000 mg | Freq: Once | INTRAMUSCULAR | Status: DC | PRN

## 2013-09-25 MED ORDER — NEOSTIGMINE METHYLSULFATE 1 MG/ML IJ SOLN
INTRAMUSCULAR | Status: DC | PRN
  Administered 2013-09-25: 5 mg via INTRAVENOUS

## 2013-09-25 MED ORDER — LIDOCAINE HCL (CARDIAC) 20 MG/ML IV SOLN
INTRAVENOUS | Status: AC
  Filled 2013-09-25: qty 5

## 2013-09-25 NOTE — Progress Notes (Signed)
UR completed.  Gavriela Cashin, RN BSN M[facility] CCM Trauma/Neuro ICU Case Manager [phone]  
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
## 2013-09-25 NOTE — Anesthesia Postprocedure Evaluation (Signed)
  Anesthesia Post-op Note  Patient: Victor [name]  Procedure(s) Performed: Procedure(s) with comments: BYPASS GRAFT FEMORAL-POPLITEAL ARTERY (Right) - Exposure of right common Femoral Artery, Harvesting of left saphenous Vein.  Right Superficial Artery Bypass with vein. FASCIOTOMY (Right) - four compartment Fasciotomy.  Patient Location: PACU  Anesthesia Type:General  Level of Consciousness: sedated, patient cooperative and responds to stimulation  Airway and Oxygen Therapy: Patient Spontanous Breathing and Patient connected to nasal cannula oxygen  Post-op Pain: none  Post-op Assessment: Post-op Vital signs reviewed, Patient's Cardiovascular Status Stable, Respiratory Function Stable, Patent Airway, No signs of Nausea or vomiting and Pain level controlled  Post-op Vital Signs: Reviewed and stable  Complications: No apparent anesthesia complications
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
## 2013-09-25 NOTE — Transfer of Care (Signed)
Immediate Anesthesia Transfer of Care Note  Patient: Victor [name]  Procedure(s) Performed: Procedure(s) with comments: BYPASS GRAFT FEMORAL-POPLITEAL ARTERY (Right) - Exposure of right common Femoral Artery, Harvesting of left saphenous Vein.  Right Superficial Artery Bypass with vein. FASCIOTOMY (Right) - four compartment Fasciotomy.  Patient Location: PACU  Anesthesia Type:General  Level of Consciousness: sedated, patient cooperative and responds to stimulation  Airway & Oxygen Therapy: Patient Spontanous Breathing and Patient connected to face mask oxygen  Post-op Assessment: Report given to PACU RN and Post -op Vital signs reviewed and stable  Post vital signs: Reviewed and stable  Complications: No apparent anesthesia complications
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
## 2013-09-26 ENCOUNTER — Encounter (HOSPITAL_COMMUNITY): Payer: Self-pay | Admitting: Surgery

## 2013-09-26 DIAGNOSIS — S75009A Unspecified injury of femoral artery, unspecified leg, initial encounter: Secondary | ICD-10-CM

## 2013-09-26 DIAGNOSIS — D62 Acute posthemorrhagic anemia: Secondary | ICD-10-CM

## 2013-09-26 DIAGNOSIS — R7309 Other abnormal glucose: Secondary | ICD-10-CM

## 2013-09-26 LAB — GLUCOSE, CAPILLARY
GLUCOSE-CAPILLARY: 102 mg/dL — AB (ref 70–99)
GLUCOSE-CAPILLARY: 106 mg/dL — AB (ref 70–99)
GLUCOSE-CAPILLARY: 114 mg/dL — AB (ref 70–99)
Glucose-Capillary: 90 mg/dL (ref 70–99)
Glucose-Capillary: 104 mg/dL — ABNORMAL HIGH (ref 70–99)

## 2013-09-26 LAB — POCT I-STAT 7, (LYTES, BLD GAS, ICA,H+H)
Acid-base deficit: 1 mmol/L (ref 0.0–2.0)
Bicarbonate: 25.1 mEq/L — ABNORMAL HIGH (ref 20.0–24.0)
Calcium, Ion: 1.22 mmol/L (ref 1.12–1.23)
HEMATOCRIT: 38 % — AB (ref 39.0–52.0)
HEMOGLOBIN: 12.9 g/dL — AB (ref 13.0–17.0)
O2 Saturation: 100 %
PCO2 ART: 43.9 mmHg (ref 35.0–45.0)
PH ART: 7.362 (ref 7.350–7.450)
PO2 ART: 189 mmHg — AB (ref 80.0–100.0)
Potassium: 4 meq/L (ref 3.7–5.3)
SODIUM: 141 meq/L (ref 137–147)
TCO2: 26 mmol/L (ref 0–100)

## 2013-09-26 LAB — POCT I-STAT GLUCOSE
GLUCOSE: 107 mg/dL — AB (ref 70–99)
OPERATOR ID: 147011

## 2013-09-26 MED ORDER — HYDROMORPHONE HCL PF 1 MG/ML IJ SOLN
0.5000 mg | INTRAMUSCULAR | Status: DC | PRN
  Administered 2013-09-26 (×2): 0.5 mg via INTRAVENOUS
  Filled 2013-09-26 (×4): qty 1

## 2013-09-26 MED ORDER — INSULIN ASPART 100 UNIT/ML ~~LOC~~ SOLN
0.0000 [IU] | Freq: Three times a day (TID) | SUBCUTANEOUS | Status: DC
  Administered 2013-09-27: 2 [IU] via SUBCUTANEOUS

## 2013-09-26 MED ORDER — TIZANIDINE HCL 4 MG PO TABS
4.0000 mg | ORAL_TABLET | Freq: Three times a day (TID) | ORAL | Status: DC | PRN
  Administered 2013-09-27 – 2013-09-29 (×3): 4 mg via ORAL
  Filled 2013-09-26 (×3): qty 1

## 2013-09-26 MED ORDER — LORAZEPAM 2 MG/ML IJ SOLN
1.0000 mg | Freq: Four times a day (QID) | INTRAMUSCULAR | Status: DC | PRN
  Administered 2013-09-27 – 2013-09-30 (×2): 1 mg via INTRAVENOUS
  Filled 2013-09-26 (×2): qty 1

## 2013-09-26 MED ORDER — SODIUM CHLORIDE 0.9 % IV SOLN
1000.0000 mL | INTRAVENOUS | Status: DC
Start: 2013-09-26 — End: 2013-09-27

## 2013-09-26 MED ORDER — HYDROMORPHONE HCL PF 1 MG/ML IJ SOLN
1.0000 mg | INTRAMUSCULAR | Status: DC | PRN
  Administered 2013-09-26 – 2013-09-27 (×2): 1 mg via INTRAVENOUS
  Filled 2013-09-26 (×2): qty 1

## 2013-09-26 MED ORDER — INSULIN ASPART 100 UNIT/ML ~~LOC~~ SOLN
0.0000 [IU] | Freq: Every day | SUBCUTANEOUS | Status: DC
Start: 2013-09-26 — End: 2013-09-30
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
## 2013-09-26 NOTE — Progress Notes (Signed)
1035: DC Dilaudid PCA. Last received dosage is 2.1mg  on patient history. Waste in syringe of 19.34ml placed in sink and syringe placed in sharps, witnessed by Domingo Dimes, RN. Pt given 2 tab oxy prior to DC per pt request. Will continue to monitor patient.
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
## 2013-09-26 NOTE — ED Provider Notes (Signed)
I saw and evaluated the patient, reviewed the resident's note and I agree with the findings and plan.   EKG Interpretation   Date/Time:  Tuesday September 24 2013 18:48:21 EST Ventricular Rate:  90 PR Interval:  175 QRS Duration: 87 QT Interval:  331 QTC Calculation: 405 R Axis:   82 Text Interpretation:  Sinus rhythm Probable left atrial enlargement  Borderline T abnormalities, inferior leads Borderline ST elevation,  anterior leads ED PHYSICIAN INTERPRETATION AVAILABLE IN [facility]  Confirmed by TEST, Record ([id number]) on 09/26/2013 7:31:14 AM        Candyce Churn III, MD 09/26/13 [phone]

## 2013-09-27 ENCOUNTER — Encounter (HOSPITAL_COMMUNITY): Payer: Self-pay | Admitting: General Practice

## 2013-09-27 DIAGNOSIS — I10 Essential (primary) hypertension: Secondary | ICD-10-CM | POA: Insufficient documentation

## 2013-09-27 DIAGNOSIS — F209 Schizophrenia, unspecified: Secondary | ICD-10-CM | POA: Insufficient documentation

## 2013-09-27 DIAGNOSIS — E119 Type 2 diabetes mellitus without complications: Secondary | ICD-10-CM | POA: Insufficient documentation

## 2013-09-27 LAB — CBC
HCT: 27.8 % — ABNORMAL LOW (ref 39.0–52.0)
HEMOGLOBIN: 9.3 g/dL — AB (ref 13.0–17.0)
MCH: 26.2 pg (ref 26.0–34.0)
MCHC: 33.5 g/dL (ref 30.0–36.0)
MCV: 78.3 fL (ref 78.0–100.0)
Platelets: 177 10*3/uL (ref 150–400)
RBC: 3.55 MIL/uL — ABNORMAL LOW (ref 4.22–5.81)
RDW: 13 % (ref 11.5–15.5)
WBC: 12.5 10*3/uL — ABNORMAL HIGH (ref 4.0–10.5)

## 2013-09-27 LAB — GLUCOSE, CAPILLARY
GLUCOSE-CAPILLARY: 103 mg/dL — AB (ref 70–99)
GLUCOSE-CAPILLARY: 105 mg/dL — AB (ref 70–99)
Glucose-Capillary: 90 mg/dL (ref 70–99)
Glucose-Capillary: 196 mg/dL — ABNORMAL HIGH (ref 70–99)

## 2013-09-27 LAB — BASIC METABOLIC PANEL
BUN: 3 mg/dL — ABNORMAL LOW (ref 6–23)
CO2: 28 mEq/L (ref 19–32)
Calcium: 8.6 mg/dL (ref 8.4–10.5)
Chloride: 101 mEq/L (ref 96–112)
Creatinine, Ser: 0.92 mg/dL (ref 0.50–1.35)
GLUCOSE: 89 mg/dL (ref 70–99)
Potassium: 3.6 mEq/L — ABNORMAL LOW (ref 3.7–5.3)
SODIUM: 139 meq/L (ref 137–147)

## 2013-09-27 MED ORDER — OXYCODONE HCL 5 MG PO TABS
10.0000 mg | ORAL_TABLET | ORAL | Status: DC | PRN
  Administered 2013-09-27 – 2013-10-03 (×25): 20 mg via ORAL
  Filled 2013-09-27 (×25): qty 4

## 2013-09-27 MED ORDER — HYDROMORPHONE HCL PF 1 MG/ML IJ SOLN
0.5000 mg | INTRAMUSCULAR | Status: DC | PRN
  Administered 2013-09-28 – 2013-10-02 (×14): 0.5 mg via INTRAVENOUS
  Filled 2013-09-27 (×15): qty 1

## 2013-09-27 MED ORDER — PREGABALIN 75 MG PO CAPS
75.0000 mg | ORAL_CAPSULE | Freq: Two times a day (BID) | ORAL | Status: DC
  Administered 2013-09-27 – 2013-10-03 (×12): 75 mg via ORAL
  Filled 2013-09-27: qty 3
  Filled 2013-09-27: qty 1
  Filled 2013-09-27 (×10): qty 3

## 2013-09-27 MED ORDER — DOCUSATE SODIUM 100 MG PO CAPS
100.0000 mg | ORAL_CAPSULE | Freq: Two times a day (BID) | ORAL | Status: DC
  Administered 2013-09-27 – 2013-09-29 (×4): 100 mg via ORAL
  Filled 2013-09-27 (×5): qty 1

## 2013-09-27 MED ORDER — NAPROXEN 500 MG PO TABS
500.0000 mg | ORAL_TABLET | Freq: Two times a day (BID) | ORAL | Status: DC
  Administered 2013-09-27 – 2013-10-03 (×13): 500 mg via ORAL
  Filled 2013-09-27 (×17): qty 1

## 2013-09-27 MED ORDER — POLYETHYLENE GLYCOL 3350 17 G PO PACK
17.0000 g | PACK | Freq: Every day | ORAL | Status: DC
  Administered 2013-09-28 – 2013-09-30 (×3): 17 g via ORAL
  Filled 2013-09-27 (×8): qty 1

## 2013-09-27 MED ORDER — PALIPERIDONE ER 6 MG PO TB24
6.0000 mg | ORAL_TABLET | Freq: Every day | ORAL | Status: DC
  Administered 2013-09-27 – 2013-10-03 (×7): 6 mg via ORAL
  Filled 2013-09-27 (×7): qty 1

## 2013-09-27 MED ORDER — HYDROMORPHONE HCL PF 1 MG/ML IJ SOLN
1.0000 mg | Freq: Once | INTRAMUSCULAR | Status: AC
  Administered 2013-09-27: 1 mg via INTRAVENOUS

## 2013-09-27 MED ORDER — ENOXAPARIN SODIUM 30 MG/0.3ML ~~LOC~~ SOLN
30.0000 mg | Freq: Two times a day (BID) | SUBCUTANEOUS | Status: DC
  Administered 2013-09-27 – 2013-10-03 (×11): 30 mg via SUBCUTANEOUS
  Filled 2013-09-27 (×17): qty 0.3
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
## 2013-09-27 NOTE — Progress Notes (Signed)
Patient has good pulses in his right foot.  Leg is swollen.    This patient has been seen and I agree with the findings and treatment plan.  Marta LamasJames O. Gae BonWyatt, III, MD, FACS [phone] (pager) [phone] (direct pager) Trauma Surgeon
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
## 2013-09-27 NOTE — H&P (Signed)
Patient refusing to finish History as he states he is tired

## 2013-09-27 NOTE — Progress Notes (Signed)
PT Cancellation Note  Patient Details Name: Victor [name] MRN: [id number] DOB: 11-23-1990   Cancelled Treatment:    Reason Eval/Treat Not Completed: Pain limiting ability to participate. Patient refused therapy this session despite multiple attempts to encourage patient. Patient stated that he was in too much pain. Pain meds already given. MD made aware   Fredrich Birks 09/27/2013, 11:05 AM
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
## 2013-09-27 NOTE — Consult Note (Addendum)
WOC wound consult note Reason for Consult: Consult requested to change vac dressing to 2 fasciotomy sites on right leg with VVS.  Arranged time of 1130 for procedure and pt medicated at 1100.  Pt states he will not allow dressing to be removed or any procedure to be performed "until he receives a lot more pain medicine."  Physician held up in OR and WOC awaiting rescheduling of event.   Wound type: Full thickness to medial and lateral right leg post-op fasciotomy sites. Drainage (amount, consistency, odor) Large amt red drainage in 2 separate vac cannisters Cammie Mcgee MSN, RN, Rober Minion, [place], CNS [phone]  VVS team arrived at 1330.  Pt medicated with extra pain meds.  Reluctant to allow dressing change but it was performed successfully once medication was effective.  Large amt pain as evidenced by pt yelling.   Medial wound beefy red with exposed muscles and tendons; 27X7X1cm Lateral wound beefy red with exposed muscles and tendons; 27X6X1.5cm One vac cannister with total of 400cc, another vac cannister with total of 250cc.  New canisters applied to both vac machines. Periwound: Generalized edema to right leg Dressing procedure/placement/frequency: Applied Mepitel contact layer and 3 pieces of black foam to each site to 61mm cont suction.  Previous  Dressing had some old clotted blood.  Good seal obtained to all areas.  Pt does not appear uncomfortable after dressing change completed, on the phone and talking without distress.  VVS team at bedside to visualize wounds, plan to close in OR early next week. Please re-consult if further assistance is needed or another vac dressing needs to be performed before pt goes to the OR.  Thank-you,  Cammie Mcgee MSN, RN, CWOCN, [place], CNS [phone]

## 2013-09-27 NOTE — Progress Notes (Signed)
2330 Patient c/o severe pain in right leg unrelieved by oxycodone given 2247. Dr. Janee Morn notify and orders received. 2341 Dilaudid 1mg  given. Will continue to monitor.

## 2013-09-28 LAB — CBC
HEMATOCRIT: 26.2 % — AB (ref 39.0–52.0)
Hemoglobin: 8.7 g/dL — ABNORMAL LOW (ref 13.0–17.0)
MCH: 26.1 pg (ref 26.0–34.0)
MCHC: 33.2 g/dL (ref 30.0–36.0)
MCV: 78.7 fL (ref 78.0–100.0)
PLATELETS: 194 10*3/uL (ref 150–400)
RBC: 3.33 MIL/uL — AB (ref 4.22–5.81)
RDW: 13.4 % (ref 11.5–15.5)
WBC: 7.5 10*3/uL (ref 4.0–10.5)

## 2013-09-28 LAB — GLUCOSE, CAPILLARY
GLUCOSE-CAPILLARY: 93 mg/dL (ref 70–99)
Glucose-Capillary: 98 mg/dL (ref 70–99)
Glucose-Capillary: 109 mg/dL — ABNORMAL HIGH (ref 70–99)

## 2013-09-28 LAB — HEMOGLOBIN A1C
Hgb A1c MFr Bld: 5.2 % (ref <5.7)
Mean Plasma Glucose: 103 mg/dL (ref <117)
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
## 2013-09-28 NOTE — Progress Notes (Signed)
Patient ID: Victor [name], male   DOB: [birth year], 23 y.o.   MRN: [id number] Palpable right dorsalis pedis pulse. VAC dressings intact. States he was more pain medication. Seems very comfortable.

## 2013-09-29 LAB — CBC
HEMATOCRIT: 28.9 % — AB (ref 39.0–52.0)
Hemoglobin: 9.3 g/dL — ABNORMAL LOW (ref 13.0–17.0)
MCH: 25.6 pg — ABNORMAL LOW (ref 26.0–34.0)
MCHC: 32.2 g/dL (ref 30.0–36.0)
MCV: 79.6 fL (ref 78.0–100.0)
PLATELETS: 285 10*3/uL (ref 150–400)
RBC: 3.63 MIL/uL — ABNORMAL LOW (ref 4.22–5.81)
RDW: 13.5 % (ref 11.5–15.5)
WBC: 9.3 10*3/uL (ref 4.0–10.5)

## 2013-09-29 LAB — GLUCOSE, CAPILLARY
Glucose-Capillary: 92 mg/dL (ref 70–99)
Glucose-Capillary: 89 mg/dL (ref 70–99)
Glucose-Capillary: 106 mg/dL — ABNORMAL HIGH (ref 70–99)
Glucose-Capillary: 102 mg/dL — ABNORMAL HIGH (ref 70–99)

## 2013-09-29 NOTE — Progress Notes (Signed)
Pt refused all 2200 medications.  He also refused to have his blood sugar check and vitals taken.  States "I am doing things on my own time".
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
## 2013-09-30 DIAGNOSIS — S81809A Unspecified open wound, unspecified lower leg, initial encounter: Secondary | ICD-10-CM

## 2013-09-30 DIAGNOSIS — S81009A Unspecified open wound, unspecified knee, initial encounter: Secondary | ICD-10-CM

## 2013-09-30 DIAGNOSIS — S91009A Unspecified open wound, unspecified ankle, initial encounter: Secondary | ICD-10-CM

## 2013-09-30 LAB — GLUCOSE, CAPILLARY
GLUCOSE-CAPILLARY: 86 mg/dL (ref 70–99)
Glucose-Capillary: 113 mg/dL — ABNORMAL HIGH (ref 70–99)

## 2013-09-30 MED ORDER — BISACODYL 5 MG PO TBEC
10.0000 mg | DELAYED_RELEASE_TABLET | Freq: Every day | ORAL | Status: DC
  Administered 2013-09-30 – 2013-10-02 (×2): 10 mg via ORAL
  Filled 2013-09-30 (×5): qty 2

## 2013-09-30 MED ORDER — MORPHINE SULFATE ER 15 MG PO TBCR
15.0000 mg | EXTENDED_RELEASE_TABLET | Freq: Two times a day (BID) | ORAL | Status: DC
  Administered 2013-09-30 – 2013-10-01 (×3): 15 mg via ORAL
  Filled 2013-09-30 (×3): qty 1

## 2013-09-30 MED ORDER — DOCUSATE SODIUM 100 MG PO CAPS
200.0000 mg | ORAL_CAPSULE | Freq: Two times a day (BID) | ORAL | Status: DC
  Administered 2013-09-30 – 2013-10-02 (×4): 200 mg via ORAL
  Filled 2013-09-30 (×6): qty 2
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
## 2013-09-30 NOTE — Clinical Social Work Note (Signed)
Clinical Social Worker had signed off, however new consult was placed stating that patient was now homeless.  CSW spoke with patient at bedside providing options for homeless shelters and the ability to remain inside with 24 hour supervision.  Patient states "oh no, I will not be going to a homeless shelter."  Patient states that he will be contacting several family members tonight to make arrangements.  CSW to follow up with patient tomorrow following the OR to determine patient plans.  CSW remains available for support as needed.  Macario GoldsJesse Sharon Rubis, [place] [phone]

## 2013-10-01 ENCOUNTER — Inpatient Hospital Stay (HOSPITAL_COMMUNITY): Payer: Medicaid Other | Admitting: Anesthesiology

## 2013-10-01 ENCOUNTER — Encounter (HOSPITAL_COMMUNITY): Payer: Medicaid Other | Admitting: Anesthesiology

## 2013-10-01 ENCOUNTER — Encounter (HOSPITAL_COMMUNITY): Payer: Self-pay | Admitting: Anesthesiology

## 2013-10-01 ENCOUNTER — Encounter (HOSPITAL_COMMUNITY): Admission: EM | Disposition: A | Discharge status: Home / Self Care | Payer: Self-pay | Source: Home / Self Care

## 2013-10-01 DIAGNOSIS — G5791 Unspecified mononeuropathy of right lower limb: Secondary | ICD-10-CM | POA: Diagnosis present

## 2013-10-01 HISTORY — PX: COMPLEX WOUND CLOSURE: SHX6446

## 2013-10-01 LAB — CREATININE, SERUM: Creatinine, Ser: 0.93 mg/dL (ref 0.50–1.35)

## 2013-10-01 SURGERY — COMPLEX CLOSURE, WOUND
Anesthesia: General | Site: Leg Lower | Laterality: Right

## 2013-10-01 MED ORDER — ARTIFICIAL TEARS OP OINT
TOPICAL_OINTMENT | OPHTHALMIC | Status: DC | PRN
  Administered 2013-10-01: 1 via OPHTHALMIC

## 2013-10-01 MED ORDER — PROMETHAZINE HCL 25 MG/ML IJ SOLN: 6.2500 mg | INTRAMUSCULAR | Status: DC | PRN

## 2013-10-01 MED ORDER — MIDAZOLAM HCL 5 MG/5ML IJ SOLN
INTRAMUSCULAR | Status: DC | PRN
  Administered 2013-10-01: 2 mg via INTRAVENOUS

## 2013-10-01 MED ORDER — FENTANYL CITRATE 0.05 MG/ML IJ SOLN
INTRAMUSCULAR | Status: AC
  Filled 2013-10-01: qty 5

## 2013-10-01 MED ORDER — PROPOFOL 10 MG/ML IV BOLUS
INTRAVENOUS | Status: AC
  Filled 2013-10-01: qty 20

## 2013-10-01 MED ORDER — LIDOCAINE HCL (CARDIAC) 20 MG/ML IV SOLN
INTRAVENOUS | Status: AC
  Filled 2013-10-01: qty 5

## 2013-10-01 MED ORDER — SUCCINYLCHOLINE CHLORIDE 20 MG/ML IJ SOLN
INTRAMUSCULAR | Status: DC | PRN
  Administered 2013-10-01: 110 mg via INTRAVENOUS

## 2013-10-01 MED ORDER — HYDROMORPHONE HCL PF 1 MG/ML IJ SOLN
INTRAMUSCULAR | Status: AC
  Filled 2013-10-01: qty 1

## 2013-10-01 MED ORDER — ROCURONIUM BROMIDE 50 MG/5ML IV SOLN
INTRAVENOUS | Status: AC
  Filled 2013-10-01: qty 1

## 2013-10-01 MED ORDER — PROPOFOL 10 MG/ML IV BOLUS
INTRAVENOUS | Status: DC | PRN
  Administered 2013-10-01: 200 mg via INTRAVENOUS

## 2013-10-01 MED ORDER — 0.9 % SODIUM CHLORIDE (POUR BTL) OPTIME
TOPICAL | Status: DC | PRN
  Administered 2013-10-01: 1000 mL

## 2013-10-01 MED ORDER — ONDANSETRON HCL 4 MG/2ML IJ SOLN
INTRAMUSCULAR | Status: DC | PRN
  Administered 2013-10-01: 4 mg via INTRAVENOUS

## 2013-10-01 MED ORDER — MEPERIDINE HCL 25 MG/ML IJ SOLN
6.2500 mg | INTRAMUSCULAR | Status: DC | PRN
Start: 2013-10-01 — End: 2013-10-01

## 2013-10-01 MED ORDER — LIDOCAINE HCL (CARDIAC) 20 MG/ML IV SOLN
INTRAVENOUS | Status: DC | PRN
  Administered 2013-10-01: 100 mg via INTRAVENOUS

## 2013-10-01 MED ORDER — LACTATED RINGERS IV SOLN
INTRAVENOUS | Status: DC
  Administered 2013-10-01: 13:00:00 via INTRAVENOUS

## 2013-10-01 MED ORDER — BACITRACIN ZINC 500 UNIT/GM EX OINT
TOPICAL_OINTMENT | CUTANEOUS | Status: AC
  Filled 2013-10-01: qty 15

## 2013-10-01 MED ORDER — KETOROLAC TROMETHAMINE 30 MG/ML IJ SOLN: 15.0000 mg | Freq: Once | INTRAMUSCULAR | Status: DC | PRN

## 2013-10-01 MED ORDER — FENTANYL CITRATE 0.05 MG/ML IJ SOLN
INTRAMUSCULAR | Status: DC | PRN
Start: 2013-10-01 — End: 2013-10-01
  Administered 2013-10-01: 100 ug via INTRAVENOUS

## 2013-10-01 MED ORDER — CEFAZOLIN SODIUM-DEXTROSE 2-3 GM-% IV SOLR
2.0000 g | Freq: Once | INTRAVENOUS | Status: AC
  Administered 2013-10-01: 2 g via INTRAVENOUS
  Filled 2013-10-01 (×2): qty 50

## 2013-10-01 MED ORDER — LACTATED RINGERS IV SOLN
INTRAVENOUS | Status: DC | PRN
  Administered 2013-10-01: 13:00:00 via INTRAVENOUS

## 2013-10-01 MED ORDER — MIDAZOLAM HCL 2 MG/2ML IJ SOLN
INTRAMUSCULAR | Status: AC
  Filled 2013-10-01: qty 2

## 2013-10-01 MED ORDER — FENTANYL CITRATE 0.05 MG/ML IJ SOLN
25.0000 ug | INTRAMUSCULAR | Status: DC | PRN
  Administered 2013-10-01: 50 ug via INTRAVENOUS

## 2013-10-01 MED ORDER — MORPHINE SULFATE ER 30 MG PO TBCR
30.0000 mg | EXTENDED_RELEASE_TABLET | Freq: Two times a day (BID) | ORAL | Status: DC
  Administered 2013-10-01: 30 mg via ORAL
  Filled 2013-10-01: qty 2

## 2013-10-01 MED ORDER — MAGNESIUM CITRATE PO SOLN
1.0000 | Freq: Once | ORAL | Status: AC
  Administered 2013-10-02: 1 via ORAL
  Filled 2013-10-01: qty 296

## 2013-10-01 MED ORDER — FENTANYL CITRATE 0.05 MG/ML IJ SOLN
INTRAMUSCULAR | Status: AC
  Filled 2013-10-01: qty 2

## 2013-10-01 MED ORDER — BACITRACIN ZINC 500 UNIT/GM EX OINT
TOPICAL_OINTMENT | CUTANEOUS | Status: DC | PRN
  Administered 2013-10-01: 1 via TOPICAL

## 2013-10-01 MED ORDER — MIDAZOLAM HCL 2 MG/2ML IJ SOLN: 0.5000 mg | Freq: Once | INTRAMUSCULAR | Status: DC | PRN

## 2013-10-01 SURGICAL SUPPLY — 33 items
BANDAGE GAUZE ELAST BULKY 4 IN (GAUZE/BANDAGES/DRESSINGS) ×4 IMPLANT
CANISTER SUCTION 2500CC (MISCELLANEOUS) ×3 IMPLANT
CHLORAPREP W/TINT 26ML (MISCELLANEOUS) ×1 IMPLANT
COVER SURGICAL LIGHT HANDLE (MISCELLANEOUS) ×3 IMPLANT
DRAPE EXTREMITY T 121X128X90 (DRAPE) ×3 IMPLANT
DRAPE PROXIMA HALF (DRAPES) ×3 IMPLANT
DRAPE UTILITY 15X26 W/TAPE STR (DRAPE) ×6 IMPLANT
ELECT REM PT RETURN 9FT ADLT (ELECTROSURGICAL) ×3
ELECTRODE REM PT RTRN 9FT ADLT (ELECTROSURGICAL) ×1 IMPLANT
GLOVE BIO SURGEON STRL SZ8 (GLOVE) ×5 IMPLANT
GLOVE BIOGEL PI IND STRL 6.5 (GLOVE) ×1 IMPLANT
GLOVE BIOGEL PI IND STRL 8 (GLOVE) ×2 IMPLANT
GLOVE BIOGEL PI INDICATOR 6.5 (GLOVE) ×2
GLOVE BIOGEL PI INDICATOR 8 (GLOVE) ×4
GLOVE SURG SS PI 6.5 STRL IVOR (GLOVE) ×2 IMPLANT
GOWN STRL REUS W/ TWL LRG LVL3 (GOWN DISPOSABLE) ×1 IMPLANT
GOWN STRL REUS W/ TWL XL LVL3 (GOWN DISPOSABLE) ×1 IMPLANT
GOWN STRL REUS W/TWL LRG LVL3 (GOWN DISPOSABLE) ×3
GOWN STRL REUS W/TWL XL LVL3 (GOWN DISPOSABLE) ×6
KIT BASIN OR (CUSTOM PROCEDURE TRAY) ×3 IMPLANT
KIT ROOM TURNOVER OR (KITS) ×3 IMPLANT
MARKER SKIN DUAL TIP RULER LAB (MISCELLANEOUS) ×3 IMPLANT
NS IRRIG 1000ML POUR BTL (IV SOLUTION) ×3 IMPLANT
PACK GENERAL/GYN (CUSTOM PROCEDURE TRAY) ×3 IMPLANT
PAD ARMBOARD 7.5X6 YLW CONV (MISCELLANEOUS) ×6 IMPLANT
SPONGE GAUZE 4X4 12PLY (GAUZE/BANDAGES/DRESSINGS) ×9 IMPLANT
STOCKINETTE IMPERVIOUS 9X36 MD (GAUZE/BANDAGES/DRESSINGS) ×1 IMPLANT
SUT ETHILON 2 0 PSLX (SUTURE) ×18 IMPLANT
TAPE CLOTH SURG 4X10 WHT LF (GAUZE/BANDAGES/DRESSINGS) ×3 IMPLANT
TOWEL OR 17X24 6PK STRL BLUE (TOWEL DISPOSABLE) ×3 IMPLANT
TOWEL OR 17X26 10 PK STRL BLUE (TOWEL DISPOSABLE) ×3 IMPLANT
UNDERPAD 30X30 INCONTINENT (UNDERPADS AND DIAPERS) ×3 IMPLANT
WATER STERILE IRR 1000ML POUR (IV SOLUTION) IMPLANT

## 2013-10-01 NOTE — Progress Notes (Signed)
Pt refusing all laxatives and stool softeners. States, "I don't want to go to the bathroom. My leg hurts."

## 2013-10-01 NOTE — Progress Notes (Signed)
Report given to angel rn as caregiver 

## 2013-10-01 NOTE — Anesthesia Postprocedure Evaluation (Signed)
Anesthesia Post Note  Patient: Victor [name]  Procedure(s) Performed: Procedure(s) (LRB): COMPLETE CLOSURE OF RLE FASIOTOMIES (Right)  Anesthesia type: GA  Patient location: PACU  Post pain: Pain level controlled  Post assessment: Post-op Vital signs reviewed  Last Vitals:  Filed Vitals:   10/01/13 1500  BP: 141/72  Pulse: 113  Temp: 36.7 C  Resp: 14    Post vital signs: Reviewed  Level of consciousness: sedated  Complications: No apparent anesthesia complications
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
## 2013-10-01 NOTE — Preoperative (Signed)
Beta Blockers   Reason not to administer Beta Blockers:Not Applicable 

## 2013-10-01 NOTE — Progress Notes (Signed)
To OR for attempted closure of RLE fasciotomies. Procedure/risks/benefits D/W him and he agrees. He relates he will be staying with his aunt at D/C. Patient examined and I agree with the assessment and plan  Violeta Gelinas, MD, MPH, FACS Trauma: [phone] General Surgery: [phone]  10/01/2013 10:55 AM

## 2013-10-01 NOTE — Anesthesia Preprocedure Evaluation (Signed)
Anesthesia Evaluation  Patient identified by MRN, date of birth, ID band Patient awake  General Assessment Comment:Pt beligerent  Reviewed: Allergy & Precautions, H&P , NPO status , Patient's Chart, lab work & pertinent test results  History of Anesthesia Complications Negative for: history of anesthetic complications  Airway Mallampati: II TM Distance: >3 FB Neck ROM: Full    Dental  (+) Teeth Intact, Dental Advisory Given   Pulmonary Current Smoker,  breath sounds clear to auscultation  Pulmonary exam normal       Cardiovascular hypertension, Pt. on medications + Peripheral Vascular Disease Rhythm:Regular Rate:Normal     Neuro/Psych negative neurological ROS     GI/Hepatic negative GI ROS, (+)     substance abuse  alcohol use and marijuana use,   Endo/Other  diabetes, Oral Hypoglycemic AgentsMorbid obesity  Renal/GU negative Renal ROS     Musculoskeletal   Abdominal (+) + obese,   Peds  Hematology  (+) anemia ,   Anesthesia Other Findings   Reproductive/Obstetrics                           Anesthesia Physical  Anesthesia Plan  ASA: III and emergent  Anesthesia Plan: General   Post-op Pain Management:    Induction: Intravenous, Rapid sequence and Cricoid pressure planned  Airway Management Planned: Oral ETT  Additional Equipment:   Intra-op Plan:   Post-operative Plan: Extubation in OR  Informed Consent: I have reviewed the patients History and Physical, chart, labs and discussed the procedure including the risks, benefits and alternatives for the proposed anesthesia with the patient or authorized representative who has indicated his/her understanding and acceptance.   Dental advisory given  Plan Discussed with: CRNA and Surgeon  Anesthesia Plan Comments: (Plan routine monitors, GETA with RSI)        Anesthesia Quick Evaluation
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
## 2013-10-01 NOTE — Transfer of Care (Signed)
Immediate Anesthesia Transfer of Care Note  Patient: Victor [name]  Procedure(s) Performed: Procedure(s) with comments: COMPLETE CLOSURE OF RLE FASIOTOMIES (Right) - removal of staples to right upper thigh  Patient Location: PACU  Anesthesia Type:General  Level of Consciousness: oriented, sedated, patient cooperative and responds to stimulation  Airway & Oxygen Therapy: Patient Spontanous Breathing and Patient connected to nasal cannula oxygen  Post-op Assessment: Report given to PACU RN, Post -op Vital signs reviewed and stable, Patient moving all extremities and Patient moving all extremities X 4  Post vital signs: Reviewed and stable  Complications: No apparent anesthesia complications

## 2013-10-01 NOTE — Progress Notes (Signed)
Pt refusing IVF. NSL.

## 2013-10-02 DIAGNOSIS — T79A29A Traumatic compartment syndrome of unspecified lower extremity, initial encounter: Secondary | ICD-10-CM

## 2013-10-02 MED ORDER — MORPHINE SULFATE ER 30 MG PO TBCR
30.0000 mg | EXTENDED_RELEASE_TABLET | Freq: Three times a day (TID) | ORAL | Status: DC
  Administered 2013-10-02 – 2013-10-03 (×4): 30 mg via ORAL
  Filled 2013-10-02 (×4): qty 2

## 2013-10-02 MED ORDER — BACITRACIN ZINC 500 UNIT/GM EX OINT
TOPICAL_OINTMENT | Freq: Two times a day (BID) | CUTANEOUS | Status: DC
  Administered 2013-10-02: 15.5556 via TOPICAL
  Administered 2013-10-02 – 2013-10-03 (×2): via TOPICAL
  Filled 2013-10-02: qty 28.35
  Filled 2013-10-02: qty 15

## 2013-10-02 NOTE — Progress Notes (Signed)
PT Cancellation Note  Patient Details Name: Victor [name] MRN: [id number] DOB: 01-Sep-1990   Cancelled Treatment:    Reason Eval/Treat Not Completed: Other (comment) (patient just finished with OT, will see patient in early am)   Fabio Asa 10/02/2013, 4:21 PM Charlotte Crumb, PT DPT  [phone]

## 2013-10-02 NOTE — Progress Notes (Signed)
Doing better. Adjusting pain meds. Possibly will be able to go home with aunt later today vs in AM. Patient examined and I agree with the assessment and plan  Violeta Gelinas, MD, MPH, FACS Trauma: [phone] General Surgery: [phone]  10/02/2013 11:11 AM
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
## 2013-10-02 NOTE — Progress Notes (Signed)
UR completed. No HH needs anticipated. Pt with Medicaid coverage so should be able to get all prescribed medications.   Carlyle Lipa, RN BSN MHA CCM Trauma/Neuro ICU Case Manager [phone]
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
## 2013-10-02 NOTE — Progress Notes (Addendum)
[facility]  Subjective  - Stil painful, but feels better when it is elevated.   Objective 129/64 93 98 F (36.7 C) (Oral) 19 98%  Intake/Output Summary (Last 24 hours) at 10/02/13 1123 Last data filed at 10/02/13 0900  Gross per 24 hour  Intake    860 ml  Output   1750 ml  Net   -890 ml    Palpable DP pulses Decreased edema.  AROM of toes and ankle minimal secondary to pain. Groin wounds are healing well. Dry dressing in place over calf.  Assessment/Planning: POD #8 #1: Right common femoral artery exposure  #2: Repair of right femoral artery injury with interposition contralateral reverse saphenous vein graft  #3: Thrombectomy right femoral-popliteal artery  #4: 4 compartment fasciotomy  Liz MaladyBurke E Thompson, MD POD #1 DELAYED PRIMARY CLOSURE OF RLE FASIOTOMIES    Clinton GallantCOLLINS, EMMA [facility] Hospital[facility] 10/02/2013 11:23 AM --  Laboratory Lab Results: No results found for this basename: WBC, HGB, HCT, PLT,  in the last 72 hours BMET  Recent Labs  10/01/13 0324  CREATININE 0.93    COAG Lab Results  Component Value Date   INR 1.48 09/24/2013   INR 1.28 05/03/2012   No results found for this basename: PTT      I agree with the above.  I appreciate general surgery's assistance with fasciotomy closure yesterday, as well as the staple removal.  The patient will possibly be discharged home today.  He'll have followup in my office in one month with a ultrasound to evaluate his bypass.  Durene CalWells Komal Stangelo

## 2013-10-03 ENCOUNTER — Encounter (HOSPITAL_COMMUNITY): Payer: Self-pay | Admitting: General Surgery

## 2013-10-03 ENCOUNTER — Telehealth: Payer: Self-pay | Admitting: Surgery

## 2013-10-03 MED ORDER — PALIPERIDONE ER 6 MG PO TB24: 6.0000 mg | ORAL_TABLET | ORAL | Status: DC

## 2013-10-03 MED ORDER — BISACODYL 5 MG PO TBEC: 10.0000 mg | DELAYED_RELEASE_TABLET | Freq: Every day | ORAL | Status: DC

## 2013-10-03 MED ORDER — PREGABALIN 150 MG PO CAPS: 150.0000 mg | ORAL_CAPSULE | Freq: Two times a day (BID) | ORAL | Status: DC

## 2013-10-03 MED ORDER — OXYCODONE-ACETAMINOPHEN 10-325 MG PO TABS: 1.0000 | ORAL_TABLET | ORAL | Status: DC | PRN

## 2013-10-03 MED ORDER — NAPROXEN 500 MG PO TABS: 500.0000 mg | ORAL_TABLET | Freq: Two times a day (BID) | ORAL | Status: DC

## 2013-10-03 MED ORDER — DOCUSATE SODIUM 250 MG PO CAPS: 250.0000 mg | ORAL_CAPSULE | Freq: Two times a day (BID) | ORAL | Status: DC

## 2013-10-03 MED ORDER — MORPHINE SULFATE ER 30 MG PO TBCR: 30.0000 mg | EXTENDED_RELEASE_TABLET | Freq: Three times a day (TID) | ORAL | Status: DC

## 2013-10-03 MED ORDER — BACITRACIN ZINC 500 UNIT/GM EX OINT: TOPICAL_OINTMENT | Freq: Two times a day (BID) | CUTANEOUS | Status: DC

## 2013-10-03 MED ORDER — POLYETHYLENE GLYCOL 3350 17 G PO PACK: 17.0000 g | PACK | Freq: Every day | ORAL | Status: DC

## 2013-10-03 NOTE — Discharge Instructions (Signed)
Wash wounds daily in shower with soap and water. Do not soak. Apply antibiotic ointment twice daily and as needed to keep moist. Cover with dry dressing.
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
## 2013-10-03 NOTE — Progress Notes (Signed)
Spoke with patient yesterday regarding need for DME at d/c. He did not wish to have a 3-in-1 BSC but felt that he would need the rolling walker. It was ordered and called into [facility] for delivery to pt room for 10/03/2013 discharge. He confirmed to me again that his aunt was going to be taking him home at d/c.   Was notified today by PA that pt wants to have a HHRN to check his wounds now and assist as able with dressing change. When I went to pt to discuss choice, agency selection and confirm his discharge location, he said that the dressing change "can't be that hard" and then pt was able to repeat to me the instructions for his wound care without any prompting. I discussed signs and symptoms of infection with him and advised him to call the MD if he noticed any of these.  He stated understanding.  I then updated the PA that the pt had declined the [facility]  Within approximately an hour, the pt's RN came to me to state that the patient is once again saying that he has nowhere to go at discharge and that he needs the [facility] to assist him. I returned to the room and confirmed that he would not be discharging with his aunt as previously discussed.  He said that this was true.  I explained to him that we would provide transportation for him to a local shelter and arrange for the [facility] to see him there. He asked if they would be able to accomodate him with his injury and I told him they would.  [facility] currently is full, but [facility] in [place] can accept the patient and they have an elevator in the facility that will allow pt easier mobility.  Pt must arrive by 3pm.  Will provide pt with a letter documenting that he will have to be allowed to remain indoors during the day because of his injury.
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
## 2013-10-03 NOTE — Telephone Encounter (Signed)
Message copied by Fredrich BirksMILLIKAN, DANA P on Thu Oct 03, 2013  3:20 PM ------      Message from: Lorin MercyMCCHESNEY, MARILYN K      Created: Thu Oct 03, 2013 10:45 AM      Regarding: schedule                   ----- Message -----         From: Dara LordsSamantha J Rhyne, PA-C         Sent: 10/03/2013  10:38 AM           To: Vvs Charge Pool            Make that 4 weeks with Dr. Myra GianottiBrabham with an arterial duplex of bypass.            Thanks,      Lelon MastSamantha ------

## 2013-10-03 NOTE — Progress Notes (Signed)
D/C Patient examined and I agree with the assessment and plan  Violeta Gelinas, MD, MPH, FACS Trauma: [phone] General Surgery: [phone]  10/03/2013 12:15 PM
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
## 2013-10-03 NOTE — Clinical Social Work Note (Signed)
Clinical Social Worker was following for potential homelessness.  Patient has made arrangements to discharge home with his aunt today.  CM working with patient regarding medications and equipment.  No further social work needs identified.   Clinical Social Worker will sign off for now as social work intervention is no longer needed. Please consult [place] again if new need arises.  Macario Golds, [place] [phone]
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
## 2013-10-03 NOTE — Progress Notes (Signed)
Discharge instructions reviewed with pt and pt's aunt at the bedside. All medications reviewed and Rx's given. Care of the surgical site and signs and symptoms of infection reviewed. Pt ready for discharge.

## 2013-10-03 NOTE — Progress Notes (Signed)
Message left for Victor [name] Case Manager that pt's Victor [name] has arrived on the unit and now wants to take him home with her. Pt no longer wants to go to [employer].  Obtained this information from the Aunt:   [facility] [address]  [name] [place]  [phone]  Also in the home Paula Compton

## 2013-10-03 NOTE — Telephone Encounter (Signed)
I have tried to reach this patient regarding his follow up appointment on home, cell and aunts home #. Cannot reach by phone.

## 2013-10-03 NOTE — Discharge Summary (Signed)
Lynasia Meloche, MD, MPH, FACS Trauma: [phone] General Surgery: [phone]  

## 2013-10-09 ENCOUNTER — Ambulatory Visit (INDEPENDENT_AMBULATORY_CARE_PROVIDER_SITE_OTHER): Payer: Medicaid Other | Admitting: Orthopedic Surgery

## 2013-10-09 VITALS — BP 134/82 | HR 142 | Temp 99.0°F | Resp 18

## 2013-10-09 DIAGNOSIS — S75009A Unspecified injury of femoral artery, unspecified leg, initial encounter: Secondary | ICD-10-CM

## 2013-10-09 MED ORDER — OXYCODONE-ACETAMINOPHEN 10-325 MG PO TABS: 1.0000 | ORAL_TABLET | ORAL | Status: DC | PRN
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
## 2013-10-16 ENCOUNTER — Ambulatory Visit (INDEPENDENT_AMBULATORY_CARE_PROVIDER_SITE_OTHER): Payer: Medicaid Other | Admitting: General Surgery

## 2013-10-16 ENCOUNTER — Encounter (INDEPENDENT_AMBULATORY_CARE_PROVIDER_SITE_OTHER): Payer: Self-pay

## 2013-10-16 VITALS — BP 138/80 | HR 78 | Temp 98.5°F | Ht 68.0 in | Wt 227.0 lb

## 2013-10-16 DIAGNOSIS — S75009A Unspecified injury of femoral artery, unspecified leg, initial encounter: Secondary | ICD-10-CM

## 2013-10-16 MED ORDER — OXYCODONE-ACETAMINOPHEN 10-325 MG PO TABS: 1.0000 | ORAL_TABLET | Freq: Four times a day (QID) | ORAL | Status: DC | PRN

## 2013-10-16 MED ORDER — GABAPENTIN 100 MG PO CAPS: 100.0000 mg | ORAL_CAPSULE | Freq: Three times a day (TID) | ORAL | Status: DC

## 2013-10-16 NOTE — Progress Notes (Signed)
Subjective: wound check and suture removal     Patient ID: Victor [name], male   DOB: [birth year], 23 y.o.   MRN: [id number]  HPI Victor [name] presents today for a wound check and suture removal.  He tells me he is due to see Dr. Myra GianottiBrabham on the 13th of April.  He continues to have pain to RLE.  His insurance will not cover the lyrica. He has been taking percocet for pain.  He is nearly out of Rx.  Last Rx was  Given 1 week ago.  He has not been showering and cleansing the wound. The dressing is being changed every other day.  Review of Systems  Constitutional: Negative for fever and chills.  Skin: Positive for wound. Negative for color change, pallor and rash.       Objective:   Physical Exam  Skin: Skin is warm and dry. No rash noted. No erythema. No pallor.  RLQ medial and lateral wound, sutures were removed with difficulties.  There was yellow slough and mild swelling.  No erythema or purulent drainage.         Assessment:     S/p closure of RLE fasciotomies     Plan:     i will start him on gapapentin 100mg  po tid.  Medication risks, benefits and therapeutic alternatives were discussed.  i have refilled his pain medication, decreased to 40 tablets.  He was advised to take naproxen and tylenol and minimize use of narcotics.  He was advised to cleanse the wound daily in the shower with mild soap and water.  He was asked to start daily dressing changes with non adherent dressing.  Follow up in 3 weeks for a wound check.  Ermelinda Eckert, ANP-BC

## 2013-10-16 NOTE — Patient Instructions (Signed)
Shower every day and cleanse the leg with mild soap and water  Change dressing once daily

## 2013-10-21 ENCOUNTER — Other Ambulatory Visit: Payer: Self-pay | Admitting: *Deleted

## 2013-10-21 DIAGNOSIS — Z48812 Encounter for surgical aftercare following surgery on the circulatory system: Secondary | ICD-10-CM

## 2013-10-21 DIAGNOSIS — W3400XA Accidental discharge from unspecified firearms or gun, initial encounter: Secondary | ICD-10-CM

## 2013-10-29 ENCOUNTER — Telehealth (HOSPITAL_COMMUNITY): Payer: Self-pay

## 2013-10-29 MED ORDER — OXYCODONE-ACETAMINOPHEN 5-325 MG PO TABS: 1.0000 | ORAL_TABLET | ORAL | Status: DC | PRN

## 2013-10-29 NOTE — Telephone Encounter (Signed)
Discussed case with Dr. Janee Morn who approved 1 more rx at lower dose, wrote for Perc 5, #60. Left message on VM of HHRN with instructions where to pick up.

## 2013-10-31 ENCOUNTER — Other Ambulatory Visit: Payer: Self-pay | Admitting: Surgery

## 2013-10-31 DIAGNOSIS — S75009A Unspecified injury of femoral artery, unspecified leg, initial encounter: Secondary | ICD-10-CM

## 2013-10-31 DIAGNOSIS — Z48812 Encounter for surgical aftercare following surgery on the circulatory system: Secondary | ICD-10-CM

## 2013-11-01 ENCOUNTER — Telehealth (INDEPENDENT_AMBULATORY_CARE_PROVIDER_SITE_OTHER): Payer: Self-pay

## 2013-11-01 ENCOUNTER — Encounter: Payer: Self-pay | Admitting: Surgery

## 2013-11-01 NOTE — Telephone Encounter (Signed)
[facility] called to get verbal order for them to go out today for one more wound check and to makes sure patient has any supplies he needs for dressing changes. They will call back if any other HH visits need to be arranged.

## 2013-11-04 ENCOUNTER — Encounter: Payer: Self-pay | Admitting: Surgery

## 2013-11-04 ENCOUNTER — Ambulatory Visit (HOSPITAL_COMMUNITY)
Admission: RE | Admit: 2013-11-04 | Discharge: 2013-11-04 | Disposition: A | Discharge status: Home / Self Care | Payer: Medicaid Other | Source: Ambulatory Visit | Attending: Surgery | Admitting: Surgery

## 2013-11-04 ENCOUNTER — Ambulatory Visit (INDEPENDENT_AMBULATORY_CARE_PROVIDER_SITE_OTHER): Payer: Medicaid Other | Admitting: Surgery

## 2013-11-04 VITALS — BP 140/78 | HR 56 | Ht 68.0 in | Wt 231.0 lb

## 2013-11-04 DIAGNOSIS — T148XXA Other injury of unspecified body region, initial encounter: Secondary | ICD-10-CM

## 2013-11-04 DIAGNOSIS — W3400XA Accidental discharge from unspecified firearms or gun, initial encounter: Secondary | ICD-10-CM

## 2013-11-04 DIAGNOSIS — Z48812 Encounter for surgical aftercare following surgery on the circulatory system: Secondary | ICD-10-CM | POA: Insufficient documentation

## 2013-11-04 DIAGNOSIS — S75009A Unspecified injury of femoral artery, unspecified leg, initial encounter: Secondary | ICD-10-CM | POA: Insufficient documentation

## 2013-11-04 DIAGNOSIS — X58XXXA Exposure to other specified factors, initial encounter: Secondary | ICD-10-CM | POA: Insufficient documentation

## 2013-11-04 NOTE — Addendum Note (Signed)
Addended by: Adria Dill L on: 11/04/2013 12:38 PM   Modules accepted: Orders
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
## 2013-11-06 ENCOUNTER — Other Ambulatory Visit (INDEPENDENT_AMBULATORY_CARE_PROVIDER_SITE_OTHER): Payer: Self-pay | Admitting: General Surgery

## 2013-11-06 ENCOUNTER — Encounter (INDEPENDENT_AMBULATORY_CARE_PROVIDER_SITE_OTHER): Payer: Self-pay

## 2013-11-06 ENCOUNTER — Ambulatory Visit (INDEPENDENT_AMBULATORY_CARE_PROVIDER_SITE_OTHER): Payer: Medicaid Other | Admitting: General Surgery

## 2013-11-06 VITALS — BP 128/80 | HR 78 | Temp 97.4°F | Resp 16 | Ht 68.0 in | Wt 231.0 lb

## 2013-11-06 DIAGNOSIS — S75009A Unspecified injury of femoral artery, unspecified leg, initial encounter: Secondary | ICD-10-CM

## 2013-11-06 DIAGNOSIS — S81809A Unspecified open wound, unspecified lower leg, initial encounter: Secondary | ICD-10-CM

## 2013-11-06 DIAGNOSIS — S81801A Unspecified open wound, right lower leg, initial encounter: Secondary | ICD-10-CM

## 2013-11-06 DIAGNOSIS — Z9889 Other specified postprocedural states: Secondary | ICD-10-CM

## 2013-11-06 DIAGNOSIS — W3400XA Accidental discharge from unspecified firearms or gun, initial encounter: Secondary | ICD-10-CM

## 2013-11-06 DIAGNOSIS — T148XXA Other injury of unspecified body region, initial encounter: Secondary | ICD-10-CM

## 2013-11-06 MED ORDER — TRAMADOL HCL 50 MG PO TABS: 50.0000 mg | ORAL_TABLET | Freq: Three times a day (TID) | ORAL | Status: DC | PRN

## 2013-11-06 MED ORDER — NAPROXEN 500 MG PO TABS: 500.0000 mg | ORAL_TABLET | Freq: Two times a day (BID) | ORAL | Status: DC

## 2013-11-06 MED ORDER — GABAPENTIN 100 MG PO CAPS: 100.0000 mg | ORAL_CAPSULE | Freq: Three times a day (TID) | ORAL | Status: DC

## 2013-11-06 NOTE — Patient Instructions (Signed)
You should increase your Neurontin from 100mg  three times a day to 200mg  three times a day for 1 week.  After 1 week increase your Neurontin to 300mg  three times a day (for a total of 900mg  daily).  This will help with your nerves and pain.  Also please continue to take your Naproxen.  Will allow low dose Ultram pain medication for pain only as needed.    You are encouraged to get in the shower and clean the wounds well.  Change the dressing everyday.
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
## 2013-11-08 ENCOUNTER — Telehealth (HOSPITAL_COMMUNITY): Payer: Self-pay

## 2013-11-08 NOTE — Telephone Encounter (Signed)
The patient states that he has medicaid and therefore we can provide a Rx for crutches.  He stated he wants a yes or no question and did not allow for further questioning such as, does he want the prescription and how to pick up.  He can call back at his convenience as he wishes.    Lisle Skillman, ANP-BC

## 2013-11-12 ENCOUNTER — Telehealth (INDEPENDENT_AMBULATORY_CARE_PROVIDER_SITE_OTHER): Payer: Self-pay
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
## 2013-11-13 ENCOUNTER — Telehealth (HOSPITAL_COMMUNITY): Payer: Self-pay

## 2013-11-13 NOTE — Telephone Encounter (Signed)
No answer, could not leave message. 

## 2013-11-20 ENCOUNTER — Telehealth (INDEPENDENT_AMBULATORY_CARE_PROVIDER_SITE_OTHER): Payer: Self-pay | Admitting: *Deleted

## 2013-11-20 NOTE — Telephone Encounter (Signed)
LM for pt to return my call.  Michael left a RX with me for pt's crutches and pt just needs to be advised to come here to the office and pick it up!  Thanks!  Victorino Dike

## 2013-11-21 ENCOUNTER — Telehealth (INDEPENDENT_AMBULATORY_CARE_PROVIDER_SITE_OTHER): Payer: Self-pay | Admitting: *Deleted

## 2013-11-21 NOTE — Telephone Encounter (Signed)
I finally got in touch with pt and he is aware of the message below.  Thanks!  Victor [name]

## 2013-11-21 NOTE — Telephone Encounter (Signed)
Wrote rx for crutches, CMA to call patient to have him pick up at [facility].
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
## 2013-11-27 ENCOUNTER — Ambulatory Visit (INDEPENDENT_AMBULATORY_CARE_PROVIDER_SITE_OTHER): Payer: Medicaid Other | Admitting: General Surgery

## 2013-11-27 ENCOUNTER — Encounter (INDEPENDENT_AMBULATORY_CARE_PROVIDER_SITE_OTHER): Payer: Self-pay

## 2013-11-27 VITALS — BP 130/74 | HR 80 | Temp 97.8°F | Resp 16 | Ht 68.0 in | Wt 223.0 lb

## 2013-11-27 DIAGNOSIS — T8189XA Other complications of procedures, not elsewhere classified, initial encounter: Secondary | ICD-10-CM

## 2013-11-27 NOTE — Progress Notes (Signed)
Subjective: wound check   Patient ID: Victor [name], male DOB: 1990/10/29, 23 y.o. MRN: [id number]   HPI  Mr. Mor presents today for a wound check.  He continues to have pain to RLE.  He does not have any wound care supplies, he was provided with some until he can [facility].  He is mobilizing.  He complains of drainage to RLE, lateral aspect.  Non purulent.     Review of Systems  Constitutional: Negative for fever and chills.  Skin: Positive for wound. Negative for color change, pallor and rash.   Objective:   Physical Exam  Skin: Skin is warm and dry. No rash noted. No erythema. No pallor.  RLQ medial wound is healing quite nicely.  RLE lateral wound, the inferior aspect remains open, crusting and clear drainage is noted.  He has edema without calf tenderness.      Assessment:   S/p closure of RLE fasciotomies    Plan:   He understands narcotics will no longer be prescribed.  He may continue to use naproxen and gabapentin.  He will likely have ongoing pain and needs to establish care with a PCP, which we have discussed in the past.  The medial wound he may leave open to air.  The lateral wound, he will continue daily dressing changes.  I would recommend compression stockings to help with the swelling.  Follow up in 6 weeks for a wound check  Jabez Molner, ANP-BC

## 2013-11-27 NOTE — Patient Instructions (Signed)
The wounds are healing, it is going to take some time for the to close up due to the amount of swelling. It would help if you bought compression stockings that go up to your knees to help with the swelling. You may continue to experience pain in the extremity.  You were taking oxycodone after your surgery.  This is taken short term and therefore you were taken off this.  Now, you may take naproxen twice daily as needed for the pain.  I would recommend you taking gabapentin 2 tablets(100mg ) 3 times per day for the nerve type pain.  This medication can be taken for extended periods.  We cannot continue to prescribe this for you.  You need to establish a primary care doctor who can manage your pain.  Wound care: Continue to cleanse your leg in the shower with soap and warm water.  Apply a dressing once daily Wear compression stockings as much as you can tolerate to help minimize the swelling.

## 2013-12-07 ENCOUNTER — Encounter (HOSPITAL_COMMUNITY): Payer: Self-pay | Admitting: Emergency Medicine

## 2013-12-07 ENCOUNTER — Emergency Department (HOSPITAL_COMMUNITY)
Admission: EM | Admit: 2013-12-07 | Discharge: 2013-12-08 | Disposition: A | Discharge status: Home / Self Care | Payer: Medicaid Other | Source: Home / Self Care | Attending: Emergency Medicine | Admitting: Emergency Medicine

## 2013-12-07 DIAGNOSIS — R112 Nausea with vomiting, unspecified: Secondary | ICD-10-CM

## 2013-12-07 LAB — COMPREHENSIVE METABOLIC PANEL
ALBUMIN: 4 g/dL (ref 3.5–5.2)
ALT: 10 U/L (ref 0–53)
AST: 16 U/L (ref 0–37)
Alkaline Phosphatase: 89 U/L (ref 39–117)
BILIRUBIN TOTAL: 0.2 mg/dL — AB (ref 0.3–1.2)
BUN: 10 mg/dL (ref 6–23)
CO2: 24 mEq/L (ref 19–32)
Calcium: 9.9 mg/dL (ref 8.4–10.5)
Chloride: 102 mEq/L (ref 96–112)
Creatinine, Ser: 0.91 mg/dL (ref 0.50–1.35)
GFR calc Af Amer: >90 mL/min (ref >90)
GFR calc non Af Amer: >90 mL/min (ref >90)
Glucose, Bld: 109 mg/dL — ABNORMAL HIGH (ref 70–99)
Potassium: 3.6 mEq/L — ABNORMAL LOW (ref 3.7–5.3)
SODIUM: 144 meq/L (ref 137–147)
TOTAL PROTEIN: 7.9 g/dL (ref 6.0–8.3)

## 2013-12-07 LAB — CBC WITH DIFFERENTIAL/PLATELET
BASOS PCT: 0 % (ref 0–1)
Basophils Absolute: 0 10*3/uL (ref 0.0–0.1)
EOS ABS: 0.1 10*3/uL (ref 0.0–0.7)
Eosinophils Relative: 1 % (ref 0–5)
HCT: 46.7 % (ref 39.0–52.0)
HEMOGLOBIN: 14.8 g/dL (ref 13.0–17.0)
LYMPHS PCT: 24 % (ref 12–46)
Lymphs Abs: 1.6 10*3/uL (ref 0.7–4.0)
MCH: 23.6 pg — AB (ref 26.0–34.0)
MCHC: 31.7 g/dL (ref 30.0–36.0)
MCV: 74.6 fL — ABNORMAL LOW (ref 78.0–100.0)
MONO ABS: 0.3 10*3/uL (ref 0.1–1.0)
Monocytes Relative: 5 % (ref 3–12)
NEUTROS PCT: 70 % (ref 43–77)
Neutro Abs: 4.8 10*3/uL (ref 1.7–7.7)
PLATELETS: 268 10*3/uL (ref 150–400)
RBC: 6.26 MIL/uL — ABNORMAL HIGH (ref 4.22–5.81)
RDW: 15 % (ref 11.5–15.5)
WBC: 6.8 10*3/uL (ref 4.0–10.5)

## 2013-12-07 LAB — LIPASE, BLOOD: Lipase: 25 U/L (ref 11–59)

## 2013-12-07 MED ORDER — ONDANSETRON HCL 4 MG/2ML IJ SOLN
4.0000 mg | Freq: Once | INTRAMUSCULAR | Status: AC
  Administered 2013-12-08: 4 mg via INTRAVENOUS
  Filled 2013-12-07: qty 2

## 2013-12-07 MED ORDER — SODIUM CHLORIDE 0.9 % IV BOLUS (SEPSIS)
1000.0000 mL | Freq: Once | INTRAVENOUS | Status: AC
  Administered 2013-12-08: 1000 mL via INTRAVENOUS

## 2013-12-07 MED ORDER — ONDANSETRON 4 MG PO TBDP
8.0000 mg | ORAL_TABLET | Freq: Once | ORAL | Status: AC
  Administered 2013-12-07: 8 mg via ORAL
  Filled 2013-12-07: qty 2

## 2013-12-07 NOTE — ED Notes (Signed)
Pt reports vomiting for two days. Pt is unable to keep anything in stomach. Pt denies fevers at home, denies diarrhea

## 2013-12-07 NOTE — ED Notes (Signed)
Patient states that he is unable to give urine sample at this time. 
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
## 2013-12-08 ENCOUNTER — Emergency Department (HOSPITAL_COMMUNITY): Payer: Medicaid Other

## 2013-12-08 ENCOUNTER — Encounter (HOSPITAL_COMMUNITY): Payer: Self-pay | Admitting: Emergency Medicine

## 2013-12-08 ENCOUNTER — Encounter (HOSPITAL_COMMUNITY): Payer: Self-pay | Admitting: *Deleted

## 2013-12-08 ENCOUNTER — Emergency Department (HOSPITAL_COMMUNITY)
Admission: EM | Admit: 2013-12-08 | Discharge: 2013-12-08 | Disposition: A | Discharge status: Home / Self Care | Payer: Medicaid Other | Source: Home / Self Care | Attending: Emergency Medicine | Admitting: Emergency Medicine

## 2013-12-08 DIAGNOSIS — R112 Nausea with vomiting, unspecified: Secondary | ICD-10-CM

## 2013-12-08 LAB — URINALYSIS, ROUTINE W REFLEX MICROSCOPIC
GLUCOSE, UA: NEGATIVE mg/dL
HGB URINE DIPSTICK: NEGATIVE
KETONES UR: 40 mg/dL — AB
Leukocytes, UA: NEGATIVE
Nitrite: NEGATIVE
PH: 6 (ref 5.0–8.0)
Protein, ur: 30 mg/dL — AB
SPECIFIC GRAVITY, URINE: 1.029 (ref 1.005–1.030)
Urobilinogen, UA: 1 mg/dL (ref 0.0–1.0)

## 2013-12-08 LAB — URINE MICROSCOPIC-ADD ON

## 2013-12-08 LAB — I-STAT CG4 LACTIC ACID, ED: Lactic Acid, Venous: 1.51 mmol/L (ref 0.5–2.2)

## 2013-12-08 MED ORDER — CEPHALEXIN 500 MG PO CAPS: 500.0000 mg | ORAL_CAPSULE | Freq: Four times a day (QID) | ORAL | Status: DC

## 2013-12-08 MED ORDER — DEXTROSE 5 % IV SOLN
1.0000 g | Freq: Once | INTRAVENOUS | Status: AC
  Administered 2013-12-08: 1 g via INTRAVENOUS
  Filled 2013-12-08: qty 10

## 2013-12-08 MED ORDER — PROMETHAZINE HCL 25 MG PO TABS
25.0000 mg | ORAL_TABLET | Freq: Once | ORAL | Status: AC
  Administered 2013-12-08: 25 mg via ORAL
  Filled 2013-12-08: qty 1

## 2013-12-08 MED ORDER — ONDANSETRON HCL 4 MG PO TABS: 4.0000 mg | ORAL_TABLET | Freq: Four times a day (QID) | ORAL | Status: DC

## 2013-12-08 MED ORDER — POTASSIUM CHLORIDE CRYS ER 20 MEQ PO TBCR
40.0000 meq | EXTENDED_RELEASE_TABLET | Freq: Once | ORAL | Status: AC
  Administered 2013-12-08: 40 meq via ORAL
  Filled 2013-12-08: qty 2

## 2013-12-08 MED ORDER — PROMETHAZINE HCL 25 MG PO TABS: 25.0000 mg | ORAL_TABLET | Freq: Four times a day (QID) | ORAL | Status: DC | PRN

## 2013-12-08 NOTE — ED Notes (Signed)
Pt sleeping then woke up to tell me that he wanted some gingerale and that his chest hurts.  Dr Dierdre Highman just walked out of the room

## 2013-12-08 NOTE — ED Notes (Addendum)
Patient just discharged from ED at 0800 today, seen for nausea and vomiting, presenting again with same and states he feels weak and feels like he cant walk straight

## 2013-12-08 NOTE — ED Notes (Signed)
Pt. Refusing rectal temperature. Dr. Dierdre Highman notified
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
## 2013-12-08 NOTE — Discharge Instructions (Signed)
Nausea and Vomiting Nausea means you feel sick to your stomach. Throwing up (vomiting) is a reflex where stomach contents come out of your mouth. HOME CARE   Take medicine as told by your doctor.  Do not force yourself to eat. However, you do need to drink fluids.  If you feel like eating, eat a normal diet as told by your doctor.  Eat rice, wheat, potatoes, bread, lean meats, yogurt, fruits, and vegetables.  Avoid high-fat foods.  Drink enough fluids to keep your pee (urine) clear or pale yellow.  Ask your doctor how to replace body fluid losses (rehydrate). Signs of body fluid loss (dehydration) include:  Feeling very thirsty.  Dry lips and mouth.  Feeling dizzy.  Dark pee.  Peeing less than normal.  Feeling confused.  Fast breathing or heart rate. GET HELP RIGHT AWAY IF:   You have blood in your throw up.  You have black or bloody poop (stool).  You have a bad headache or stiff neck.  You feel confused.  You have bad belly (abdominal) pain.  You have chest pain or trouble breathing.  You do not pee at least once every 8 hours.  You have cold, clammy skin.  You keep throwing up after 24 to 48 hours.  You have a fever. MAKE SURE YOU:   Understand these instructions.  Will watch your condition.  Will get help right away if you are not doing well or get worse. Document Released: 12/28/2007 Document Revised: 10/03/2011 Document Reviewed: 12/10/2010 [facility] Patient Information 2014 [place], [place].  Take Phenergan along with the Zofran. Followup with your regular Dr. in the next few days. Return for any new or worse symptoms.

## 2013-12-08 NOTE — ED Notes (Signed)
Patient states he is now having chest pain and cant hardly walk straight

## 2013-12-08 NOTE — ED Notes (Signed)
Dr. Zackowski at bedside  

## 2013-12-08 NOTE — Discharge Instructions (Signed)

## 2013-12-08 NOTE — ED Notes (Signed)
Pt c/o chest pain 
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
## 2013-12-08 NOTE — ED Notes (Signed)
Pt. Tolerating oral fluids.

## 2013-12-08 NOTE — ED Notes (Signed)
Pt refused rectal temperature 

## 2013-12-10 ENCOUNTER — Encounter (HOSPITAL_COMMUNITY): Payer: Self-pay | Admitting: Emergency Medicine

## 2013-12-10 ENCOUNTER — Observation Stay (HOSPITAL_COMMUNITY)
Admission: EM | Admit: 2013-12-10 | Discharge: 2013-12-11 | Disposition: A | Discharge status: Home / Self Care | Payer: Medicaid Other | Source: Home / Self Care | Attending: Emergency Medicine | Admitting: Emergency Medicine

## 2013-12-10 DIAGNOSIS — R1110 Vomiting, unspecified: Secondary | ICD-10-CM | POA: Diagnosis present

## 2013-12-10 DIAGNOSIS — R1115 Cyclical vomiting syndrome unrelated to migraine: Secondary | ICD-10-CM

## 2013-12-10 DIAGNOSIS — F1210 Cannabis abuse, uncomplicated: Secondary | ICD-10-CM

## 2013-12-10 LAB — COMPREHENSIVE METABOLIC PANEL
ALBUMIN: 4.3 g/dL (ref 3.5–5.2)
ALK PHOS: 95 U/L (ref 39–117)
ALT: 11 U/L (ref 0–53)
AST: 17 U/L (ref 0–37)
BUN: 9 mg/dL (ref 6–23)
CO2: 26 mEq/L (ref 19–32)
Calcium: 9.9 mg/dL (ref 8.4–10.5)
Chloride: 99 mEq/L (ref 96–112)
Creatinine, Ser: 1.03 mg/dL (ref 0.50–1.35)
GFR calc Af Amer: >90 mL/min (ref >90)
GFR calc non Af Amer: >90 mL/min (ref >90)
Glucose, Bld: 84 mg/dL (ref 70–99)
POTASSIUM: 3.8 meq/L (ref 3.7–5.3)
SODIUM: 143 meq/L (ref 137–147)
TOTAL PROTEIN: 8.3 g/dL (ref 6.0–8.3)
Total Bilirubin: 0.4 mg/dL (ref 0.3–1.2)

## 2013-12-10 LAB — CBC WITH DIFFERENTIAL/PLATELET
BASOS PCT: 1 % (ref 0–1)
Basophils Absolute: 0 10*3/uL (ref 0.0–0.1)
Eosinophils Absolute: 0.2 10*3/uL (ref 0.0–0.7)
Eosinophils Relative: 3 % (ref 0–5)
HEMATOCRIT: 49.2 % (ref 39.0–52.0)
HEMOGLOBIN: 15.7 g/dL (ref 13.0–17.0)
LYMPHS PCT: 39 % (ref 12–46)
Lymphs Abs: 2.9 10*3/uL (ref 0.7–4.0)
MCH: 23.8 pg — ABNORMAL LOW (ref 26.0–34.0)
MCHC: 31.9 g/dL (ref 30.0–36.0)
MCV: 74.5 fL — ABNORMAL LOW (ref 78.0–100.0)
MONO ABS: 0.8 10*3/uL (ref 0.1–1.0)
MONOS PCT: 10 % (ref 3–12)
NEUTROS ABS: 3.6 10*3/uL (ref 1.7–7.7)
Neutrophils Relative %: 48 % (ref 43–77)
Platelets: 290 10*3/uL (ref 150–400)
RBC: 6.6 MIL/uL — ABNORMAL HIGH (ref 4.22–5.81)
RDW: 15.2 % (ref 11.5–15.5)
WBC: 7.5 10*3/uL (ref 4.0–10.5)

## 2013-12-10 MED ORDER — SODIUM CHLORIDE 0.9 % IV BOLUS (SEPSIS)
1000.0000 mL | INTRAVENOUS | Status: AC
  Administered 2013-12-10: 1000 mL via INTRAVENOUS

## 2013-12-10 MED ORDER — ONDANSETRON 4 MG PO TBDP
8.0000 mg | ORAL_TABLET | Freq: Once | ORAL | Status: AC
  Administered 2013-12-10: 8 mg via ORAL
  Filled 2013-12-10: qty 2

## 2013-12-10 MED ORDER — PROMETHAZINE HCL 25 MG/ML IJ SOLN
25.0000 mg | Freq: Once | INTRAMUSCULAR | Status: AC
  Administered 2013-12-10: 25 mg via INTRAVENOUS
  Filled 2013-12-10 (×2): qty 1

## 2013-12-10 NOTE — ED Notes (Signed)
Pt presents with ongoing generalized abdominal pain. PT has been seen here twice for the same. Denies pain or symptoms have changed since 5/17.  States he didn't fill prescriptions. When asked why not pt states "well I don't think I can afford the co pay. I never went though to check." Pt states pain is 7/10. Pt denies any relief with Zofran ODT., states "it dissolved under my tongue and then I just threw it up." VSS on monitor. Pt in NAD. Mucous membranes moist.

## 2013-12-10 NOTE — ED Notes (Addendum)
He states hes been vomiting for 6 days and can not tolerate any oral intake at home. Denies pain. He states he was just here for the same complaint but he doesn't remember what they told him to do after discharge
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
## 2013-12-11 ENCOUNTER — Observation Stay (HOSPITAL_COMMUNITY): Payer: Medicaid Other

## 2013-12-11 ENCOUNTER — Inpatient Hospital Stay (HOSPITAL_COMMUNITY)
Admission: EM | Admit: 2013-12-11 | Discharge: 2013-12-12 | DRG: 885 | Disposition: A | Discharge status: Home / Self Care | Payer: Medicaid Other | Attending: Emergency Medicine | Admitting: Emergency Medicine

## 2013-12-11 ENCOUNTER — Emergency Department (HOSPITAL_COMMUNITY): Payer: Medicaid Other

## 2013-12-11 ENCOUNTER — Encounter (HOSPITAL_COMMUNITY): Payer: Self-pay | Admitting: Emergency Medicine

## 2013-12-11 DIAGNOSIS — H409 Unspecified glaucoma: Secondary | ICD-10-CM | POA: Diagnosis present

## 2013-12-11 DIAGNOSIS — F209 Schizophrenia, unspecified: Secondary | ICD-10-CM

## 2013-12-11 DIAGNOSIS — F29 Unspecified psychosis not due to a substance or known physiological condition: Secondary | ICD-10-CM

## 2013-12-11 DIAGNOSIS — F259 Schizoaffective disorder, unspecified: Secondary | ICD-10-CM | POA: Diagnosis not present

## 2013-12-11 DIAGNOSIS — F411 Generalized anxiety disorder: Secondary | ICD-10-CM | POA: Diagnosis present

## 2013-12-11 DIAGNOSIS — I10 Essential (primary) hypertension: Secondary | ICD-10-CM | POA: Diagnosis present

## 2013-12-11 DIAGNOSIS — E119 Type 2 diabetes mellitus without complications: Secondary | ICD-10-CM | POA: Diagnosis present

## 2013-12-11 DIAGNOSIS — Z888 Allergy status to other drugs, medicaments and biological substances status: Secondary | ICD-10-CM | POA: Diagnosis not present

## 2013-12-11 DIAGNOSIS — R1110 Vomiting, unspecified: Secondary | ICD-10-CM | POA: Diagnosis present

## 2013-12-11 DIAGNOSIS — F4320 Adjustment disorder, unspecified: Secondary | ICD-10-CM | POA: Diagnosis present

## 2013-12-11 DIAGNOSIS — F3289 Other specified depressive episodes: Secondary | ICD-10-CM | POA: Diagnosis present

## 2013-12-11 DIAGNOSIS — G589 Mononeuropathy, unspecified: Secondary | ICD-10-CM | POA: Diagnosis present

## 2013-12-11 DIAGNOSIS — S81009A Unspecified open wound, unspecified knee, initial encounter: Secondary | ICD-10-CM

## 2013-12-11 DIAGNOSIS — F17200 Nicotine dependence, unspecified, uncomplicated: Secondary | ICD-10-CM | POA: Diagnosis present

## 2013-12-11 DIAGNOSIS — F329 Major depressive disorder, single episode, unspecified: Secondary | ICD-10-CM | POA: Diagnosis present

## 2013-12-11 DIAGNOSIS — R45851 Suicidal ideations: Secondary | ICD-10-CM

## 2013-12-11 DIAGNOSIS — R443 Hallucinations, unspecified: Secondary | ICD-10-CM | POA: Diagnosis not present

## 2013-12-11 DIAGNOSIS — R45850 Homicidal ideations: Secondary | ICD-10-CM | POA: Diagnosis not present

## 2013-12-11 DIAGNOSIS — Z79899 Other long term (current) drug therapy: Secondary | ICD-10-CM

## 2013-12-11 DIAGNOSIS — L0390 Cellulitis, unspecified: Secondary | ICD-10-CM | POA: Insufficient documentation

## 2013-12-11 DIAGNOSIS — F4325 Adjustment disorder with mixed disturbance of emotions and conduct: Secondary | ICD-10-CM

## 2013-12-11 DIAGNOSIS — R112 Nausea with vomiting, unspecified: Secondary | ICD-10-CM

## 2013-12-11 DIAGNOSIS — R440 Auditory hallucinations: Secondary | ICD-10-CM

## 2013-12-11 DIAGNOSIS — S81809A Unspecified open wound, unspecified lower leg, initial encounter: Secondary | ICD-10-CM | POA: Diagnosis present

## 2013-12-11 DIAGNOSIS — D649 Anemia, unspecified: Secondary | ICD-10-CM

## 2013-12-11 DIAGNOSIS — S91009A Unspecified open wound, unspecified ankle, initial encounter: Secondary | ICD-10-CM

## 2013-12-11 LAB — RAPID URINE DRUG SCREEN, HOSP PERFORMED
AMPHETAMINES: NOT DETECTED
AMPHETAMINES: NOT DETECTED
BARBITURATES: NOT DETECTED
BENZODIAZEPINES: NOT DETECTED
Barbiturates: NOT DETECTED
Benzodiazepines: NOT DETECTED
Cocaine: NOT DETECTED
Cocaine: NOT DETECTED
OPIATES: NOT DETECTED
Opiates: NOT DETECTED
TETRAHYDROCANNABINOL: POSITIVE — AB
Tetrahydrocannabinol: POSITIVE — AB

## 2013-12-11 LAB — URINE MICROSCOPIC-ADD ON

## 2013-12-11 LAB — COMPREHENSIVE METABOLIC PANEL
ALBUMIN: 3.5 g/dL (ref 3.5–5.2)
ALT: 9 U/L (ref 0–53)
AST: 16 U/L (ref 0–37)
Alkaline Phosphatase: 80 U/L (ref 39–117)
BUN: 11 mg/dL (ref 6–23)
CALCIUM: 9.1 mg/dL (ref 8.4–10.5)
CO2: 28 mEq/L (ref 19–32)
Chloride: 104 mEq/L (ref 96–112)
Creatinine, Ser: 0.98 mg/dL (ref 0.50–1.35)
GFR calc Af Amer: >90 mL/min (ref >90)
GFR calc non Af Amer: >90 mL/min (ref >90)
Glucose, Bld: 81 mg/dL (ref 70–99)
Potassium: 3.8 mEq/L (ref 3.7–5.3)
Sodium: 143 mEq/L (ref 137–147)
Total Bilirubin: 0.3 mg/dL (ref 0.3–1.2)
Total Protein: 7.1 g/dL (ref 6.0–8.3)

## 2013-12-11 LAB — ETHANOL: Alcohol, Ethyl (B): <11 mg/dL (ref 0–11)

## 2013-12-11 LAB — CBC
HCT: 44.5 % (ref 39.0–52.0)
Hemoglobin: 13.8 g/dL (ref 13.0–17.0)
MCH: 22.8 pg — AB (ref 26.0–34.0)
MCHC: 31 g/dL (ref 30.0–36.0)
MCV: 73.6 fL — ABNORMAL LOW (ref 78.0–100.0)
PLATELETS: 256 10*3/uL (ref 150–400)
RBC: 6.05 MIL/uL — ABNORMAL HIGH (ref 4.22–5.81)
RDW: 14.9 % (ref 11.5–15.5)
WBC: 6.2 10*3/uL (ref 4.0–10.5)

## 2013-12-11 LAB — CBG MONITORING, ED: GLUCOSE-CAPILLARY: 84 mg/dL (ref 70–99)

## 2013-12-11 LAB — SALICYLATE LEVEL: Salicylate Lvl: <2 mg/dL — ABNORMAL LOW (ref 2.8–20.0)

## 2013-12-11 LAB — URINALYSIS, ROUTINE W REFLEX MICROSCOPIC
GLUCOSE, UA: NEGATIVE mg/dL
Hgb urine dipstick: NEGATIVE
Ketones, ur: 40 mg/dL — AB
Leukocytes, UA: NEGATIVE
Nitrite: NEGATIVE
PH: 6.5 (ref 5.0–8.0)
Protein, ur: 30 mg/dL — AB
Specific Gravity, Urine: 1.031 — ABNORMAL HIGH (ref 1.005–1.030)
Urobilinogen, UA: 1 mg/dL (ref 0.0–1.0)

## 2013-12-11 LAB — ACETAMINOPHEN LEVEL: Acetaminophen (Tylenol), Serum: <15 ug/mL (ref 10–30)

## 2013-12-11 MED ORDER — KETOROLAC TROMETHAMINE 30 MG/ML IJ SOLN
30.0000 mg | Freq: Once | INTRAMUSCULAR | Status: AC
  Administered 2013-12-11: 30 mg via INTRAVENOUS
  Filled 2013-12-11: qty 1

## 2013-12-11 MED ORDER — ONDANSETRON HCL 4 MG/2ML IJ SOLN
4.0000 mg | Freq: Once | INTRAMUSCULAR | Status: AC
  Administered 2013-12-11: 4 mg via INTRAVENOUS
  Filled 2013-12-11: qty 2

## 2013-12-11 MED ORDER — LORAZEPAM 2 MG/ML IJ SOLN
INTRAMUSCULAR | Status: AC
  Administered 2013-12-11: 1 mg via INTRAVENOUS
  Filled 2013-12-11: qty 1

## 2013-12-11 MED ORDER — GABAPENTIN 100 MG PO CAPS
100.0000 mg | ORAL_CAPSULE | Freq: Three times a day (TID) | ORAL | Status: DC
  Administered 2013-12-11 (×2): 100 mg via ORAL
  Filled 2013-12-11 (×5): qty 1

## 2013-12-11 MED ORDER — SODIUM CHLORIDE 0.9 % IV SOLN
INTRAVENOUS | Status: DC
Start: 2013-12-11 — End: 2013-12-11
  Administered 2013-12-11: 03:00:00 via INTRAVENOUS

## 2013-12-11 MED ORDER — STERILE WATER FOR INJECTION IJ SOLN
INTRAMUSCULAR | Status: AC
  Administered 2013-12-11: 11:00:00
  Filled 2013-12-11: qty 10

## 2013-12-11 MED ORDER — LORAZEPAM 2 MG/ML IJ SOLN
1.0000 mg | Freq: Once | INTRAMUSCULAR | Status: AC
  Administered 2013-12-11: 1 mg via INTRAVENOUS

## 2013-12-11 MED ORDER — ZIPRASIDONE MESYLATE 20 MG IM SOLR
20.0000 mg | Freq: Once | INTRAMUSCULAR | Status: AC
  Administered 2013-12-11: 20 mg via INTRAMUSCULAR

## 2013-12-11 MED ORDER — PALIPERIDONE ER 6 MG PO TB24
6.0000 mg | ORAL_TABLET | ORAL | Status: DC
  Administered 2013-12-12: 6 mg via ORAL
  Filled 2013-12-11 (×2): qty 1

## 2013-12-11 MED ORDER — ZIPRASIDONE MESYLATE 20 MG IM SOLR: 20.0000 mg | INTRAMUSCULAR | Status: DC | PRN

## 2013-12-11 MED ORDER — ONDANSETRON HCL 4 MG PO TABS: 4.0000 mg | ORAL_TABLET | Freq: Four times a day (QID) | ORAL | Status: DC

## 2013-12-11 MED ORDER — ZIPRASIDONE MESYLATE 20 MG IM SOLR
INTRAMUSCULAR | Status: AC
  Administered 2013-12-11: 20 mg via INTRAMUSCULAR
  Filled 2013-12-11: qty 20

## 2013-12-11 MED ORDER — PROMETHAZINE HCL 25 MG PO TABS: 25.0000 mg | ORAL_TABLET | Freq: Four times a day (QID) | ORAL | Status: DC | PRN

## 2013-12-11 MED ORDER — IBUPROFEN 800 MG PO TABS
800.0000 mg | ORAL_TABLET | Freq: Four times a day (QID) | ORAL | Status: DC | PRN
  Filled 2013-12-11: qty 1

## 2013-12-11 MED ORDER — ACETAMINOPHEN 325 MG PO TABS: 650.0000 mg | ORAL_TABLET | Freq: Four times a day (QID) | ORAL | Status: DC | PRN

## 2013-12-11 MED ORDER — LORAZEPAM 2 MG/ML IJ SOLN: 1.0000 mg | Freq: Once | INTRAMUSCULAR | Status: DC
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
## 2013-12-11 NOTE — Discharge Instructions (Signed)
Marijuana Abuse and Chemical Dependency WHEN IS DRUG USE A PROBLEM? Problems related to drug use usually begin with abuse of the substance and lead to dependency.  Abuse is repeated use of a drug with recurrent and significant negative consequences. Abuse happens anytime drug use is interfering with normal living activities including:   Failure to fulfill major obligations at work, school or home (poor work Systems analyst, missing work or school and/or neglecting children and home).  Engaging in activities that are physically dangerous (driving a car or doing recreational activities such as swimming or rock climbing) while under the effects of the drug.  Recurrent drug-related legal problems (arrests for disorderly conduct or assault and battery).  Recurrent social or interpersonal problems caused or increased by the effects of the drug (arguments with family or friends, or physical fights). Dependency has two parts.   You first develop an emotional/psychological dependence. Psychological dependence develops when your mind tells you that the drug is needed. You come to believe it helps you cope with life.  This is usually followed by physical dependence which has developed when continuing increases of drugs are required to get the same feeling or "high." This may result in:  Withdrawal symptoms such as shakes or tremors.  The substance being over a longer period of time than intended.  An ongoing desire, or unsuccessful effort to, cut down or control the use.  Greater amounts of time spent getting the drug, using the drug or recovering from the effects of the drug.  Important social, work or interests and activities are given up or reduced because or drug use.  Substance is used despite knowledge of ongoing physical (ulcers) or psychological (depression) problems. SIGNS OF CHEMICAL DEPENDENCY:  Friends or family say there is a problem.  Fighting when using drugs.  Having blackouts  (not remembering what you do while using).  Feel sick from using drugs but continue using.  Lie about use or amounts of drugs used.  Need drugs to get you going.  Need drugs to relate to people or feel comfortable in social situations.  Use drugs to forget problems. A "yes" answered to any of the above signs of chemical dependency indicates there are problems. The longer the use of drugs continues, the greater the problems will become. If there is a family history of drug or alcohol use it is best not to experiment with drugs. Experimentation leads to tolerance. Addiction is followed by dependency where drugs are now needed not just to get high but to feel normal. Addiction cannot be cured but it can be stopped. This often requires outside help and the care of professionals. Treatment centers are listed in the yellow pages under: Cocaine, Narcotics, and Alcoholics anonymous. Most hospitals and clinics can refer you to a specialized care center. WHAT IS MARIJUANA? Marijuana is a plant which grows wild all over the world. The plant contains many chemicals but the active ingredient of the plant is THC (tetrahydrocannabinol). This is responsible for the "high" perceived by people using the drug. HOW IS MARIJUANA USED? Marijuana is smoked, eaten in brownies or any other food, and drank as a tea. WHAT ARE THE EFFECTS OF MARIJUANA? Marijuana is a nervous system depressant which slows the thinking process. Because of this effect, users think marijuana has a calming effect. Actually what happens is the air carrying tubules in the lung become relaxed and allow more oxygen to enter. This causes the user to feel high. The blood pressure falls so less blood  reaches the brain and the heart speeds up. As the effects wear off the user becomes depressed. Some people become very paranoid during use. They feel as though people are out to get them. Periodic use can interfere with performance at school or work.  Generally Marijuana use does not develop into a physical dependence, but it is very habit forming. Marijuana is also seen as a gateway to use of harder drugs. Strong habits such as using Marijuana, as with all drugs and addictions, can only be helped by stopping use of all chemicals. This is hard but may save your life.  OTHER HEALTH RISKS OF MARIJUANA AND DRUG USE ARE: The increased possibility of getting AIDS or hepatitis (liver inflammation).  HOW TO STAY DRUG FREE ONCE YOU HAVE QUIT USING:  Develop healthy activities and form friends who do not use drugs.  Stay away from the drug scene.  Tell the those who want you to use drugs you have other, better things to do.  Have ready excuses available about why you cannot use.  Attend 12-Step Meetings for support from other recovering people. FOR MORE HELP OR INFORMATION CONTACT YOUR LOCAL CAREGIVER, CLINIC, [place]. Document Released: 07/08/2000 Document Revised: 11/05/2012 Document Reviewed: 08/08/2007 [facility] Patient Information 2014 [place].  Cyclic Vomiting Syndrome Cyclic vomiting syndrome is a benign condition in which patients experience bouts or cycles of severe nausea and vomiting that last for hours or even days. The bouts of nausea and vomiting alternate with longer periods of no symptoms and generally good health. Cyclic vomiting syndrome occurs mostly in children, but can affect adults. CAUSES  [facility] has no known cause. Each episode is typically similar to the previous ones. The episodes tend to:   Start at about the same time of day.  Last the same length of time.  Present the same symptoms at the same level of intensity. Cyclic vomiting syndrome can begin at any age in children and adults. Cyclic vomiting syndrome usually starts between the ages of 3 and 7 years. In adults, episodes tend to occur less often than they do in children, but they last longer. Furthermore, the events or situations that trigger episodes  in adults cannot always be pinpointed as easily as they can in children. There are 4 phases of cyclic vomiting syndrome: 1. Prodrome. The prodrome phase signals that an episode of nausea and vomiting is about to begin. This phase can last from just a few minutes to several hours. This phase is often marked by belly (abdominal) pain. Sometimes taking medicine early in the prodrome phase can stop an episode in progress. However, sometimes there is no warning. A person may simply wake up in the middle of the night or early morning and begin vomiting. 2. Episode. The episode phase consists of:  Severe vomiting.  Nausea.  Gagging (retching). 3. Recovery. The recovery phase begins when the nausea and vomiting stop. Healthy color, appetite, and energy return. 4. Symptom-free interval. The symptom-free interval phase is the period between episodes when no symptoms are present. TRIGGERS Episodes can be triggered by an infection or event. Examples of triggers include:  Infections.  Colds, allergies, sinus problems, and the flu.  Eating certain foods such as chocolate or cheese.  Foods with monosodium glutamate (MSG) or preservatives.  Fast foods.  Pre-packaged foods.  Foods with low nutritional value (junk foods).  Overeating.  Eating just before going to bed.  Hot weather.  Dehydration.  Not enough sleep or poor sleep quality.  Physical  exhaustion.  Menstruation.  Motion sickness.  Emotional stress (school or home difficulties).  Excitement or stress. SYMPTOMS  The main symptoms of cyclic vomiting syndrome are:  Severe vomiting.  Nausea.  Gagging (retching). Episodes usually begin at night or the first thing in the morning. Episodes may include vomiting or retching up to 5 or 6 times an hour during the worst of the episode. Episodes usually last anywhere from 1 to 4 days. Episodes can last for up to 10 days. Other symptoms  include:  Paleness.  Exhaustion.  Listlessness.  Abdominal pain.  Loose stools or diarrhea. Sometimes the nausea and vomiting are so severe that a person appears to be almost unconscious. Sensitivity to light, headache, fever, dizziness, may also accompany an episode. In addition, the vomiting may cause drooling and excessive thirst. Drinking water usually leads to more vomiting, though the water can dilute the acid in the vomit, making the episode a little less painful. Continuous vomiting can lead to dehydration, which means that the body has lost excessive water and salts. DIAGNOSIS  Cyclic vomiting syndrome is hard to diagnose because there are no clear tests to identify it. A caregiver must diagnose cyclic vomiting syndrome by looking at symptoms and medical history. A caregiver must exclude more common diseases or disorders that can also cause nausea and vomiting. Also, diagnosis takes time because caregivers need to identify a pattern or cycle to the vomiting. TREATMENT  Cyclic vomiting syndrome cannot be cured. Treatment varies, but people with cyclic vomiting syndrome should get plenty of rest and sleep and take medications that prevent, stop, or lessen the vomiting episodes and other symptoms. People whose episodes are frequent and long-lasting may be treated during the symptom-free intervals in an effort to prevent or ease future episodes. The symptom-free phase is a good time to eliminate anything known to trigger an episode. For example, if episodes are brought on by stress or excitement, this period is the time to find ways to reduce stress and stay calm. If sinus problems or allergies cause episodes, those conditions should be treated. The triggers listed above should be avoided or prevented. Because of the similarities between migraine and cyclic vomiting syndrome, caregivers treat some people with severe cyclic vomiting syndrome with drugs that are also used for migraine headaches.  The drugs are designed to:  Prevent episodes.  Reduce their frequency.  Lessen their severity. HOME CARE INSTRUCTIONS Once a vomiting episode begins, treatment is supportive. It helps to stay in bed and sleep in a dark, quiet room. Severe nausea and vomiting may require hospitalization and intravenous (IV) fluids to prevent dehydration. Relaxing medications (sedatives) may help if the nausea continues. Sometimes, during the prodrome phase, it is possible to stop an episode from happening altogether. Only take over-the-counter or prescription medicines for pain, discomfort or fever as directed by your caregiver. Do not give aspirin to children. During the recovery phase, drinking water and replacing lost electrolytes (salts in the blood) are very important. Electrolytes are salts that the body needs to function well and stay healthy. Symptoms during the recovery phase can vary. Some people find that their appetites return to normal immediately, while others need to begin by drinking clear liquids and then move slowly to solid food. RELATED COMPLICATIONS The severe vomiting that defines cyclic vomiting syndrome is a risk factor for several complications:  Dehydration Vomiting causes the body to lose water quickly.  Electrolyte imbalance Vomiting also causes the body to lose the important salts it needs to  keep working properly. °· Peptic esophagitis The tube that connects the mouth to the stomach (esophagus) becomes injured from the stomach acid that comes up with the vomit. °· Hematemesis The esophagus becomes irritated and bleeds, so blood mixes with the vomit. °· Mallory-Weiss tear The lower end of the esophagus may tear open or the stomach may bruise from vomiting or retching. °· Tooth decay The acid in the vomit can hurt the teeth by corroding the tooth enamel. °SEEK MEDICAL CARE IF: °You have questions or problems. °Document Released: 09/19/2001 Document Revised: 10/03/2011 Document Reviewed:  10/18/2010 °[employer]® Patient Information ©2014 [employer]. ° °

## 2013-12-11 NOTE — ED Notes (Signed)
While patient was awake this tech reminded patient about urine sample. Patient acknowledged this tech and stated he would try. Urnal at the bedside.

## 2013-12-11 NOTE — Consult Note (Signed)
ATTENDING ADDENDUM:  I personally reviewed patient's record, examined the patient, and formulated the following assessment and plan:  Inferior aspect of L lateral leg wound with open granulation tissue bed.  No sign of wound infection or cellulitis.  Wash wound with soap and water.  Cover with xeroform gauze and a dry dressing.  Change BID.
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
## 2013-12-11 NOTE — ED Notes (Addendum)
Pt very upset yelling and screaming at staff Dr. Blinda Leatherwood is giving med to pt to allow pt to calm down will attempt to collect blood when pt is calm

## 2013-12-11 NOTE — ED Provider Notes (Addendum)
Medical screening examination/treatment/procedure(s) were performed by non-physician practitioner and as supervising physician I was immediately available for consultation/collaboration.   EKG Interpretation None       Sunnie Nielsen, MD 12/11/13 0257  PT still symptomatic, IV zofran repeated, discussed with hospitalist on call, DR Toniann Fail, requesting Ct brain and will evaluate for admit.  After evaluation, recommended if tolerates oral fluids and CT scan negative, does not require admission.   Symptoms may be due to marijuana abuse/cyclic vomiting syndrome. Patient has not filled prescriptions from previous visits. He was encouraged to fill Phenergan on the 4 dollar list and followup with his primary care physician.   Medical screening examination/treatment/procedure(s) were conducted as a shared visit with non-physician practitioner(s) and myself.  I personally evaluated the patient during the encounter.    Sunnie Nielsen, MD 12/11/13 0700

## 2013-12-11 NOTE — ED Notes (Signed)
Patient transported to X-ray 

## 2013-12-11 NOTE — ED Notes (Signed)
Pt was made aware of urine sample. States he can not go at this time. Will encourage pt to try again. Rn notifited

## 2013-12-11 NOTE — ED Notes (Signed)
Pt became very angry when asked for blood and to change.  Pt yelling and threatening.  Dr. Blinda LeatherwoodPollina brought into room and verbal orders for 20 mg of geodon and 1 mg of ativan given.

## 2013-12-11 NOTE — ED Notes (Signed)
Patient is alsleep and sitter at the bedside. This tech will attempt to gain urine when patient wakes.

## 2013-12-11 NOTE — ED Notes (Signed)
Admitting physician at bedside

## 2013-12-11 NOTE — ED Notes (Signed)
Pt sleeping will attempt to obtain urine when pt wakes up
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
## 2013-12-11 NOTE — ED Notes (Signed)
Per ED secretary Clydie BraunKaren Pt not to be admitted.

## 2013-12-11 NOTE — ED Notes (Signed)
Patient belongings are in locker 26 in [place], Patient has been wanded by security along with belongings.
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
## 2013-12-11 NOTE — ED Notes (Signed)
Surgical PA reports patient does not need surgery or to be admitted. No saline lock IV needed at this time and has not been inserted.

## 2013-12-11 NOTE — ED Notes (Signed)
Pt resting.

## 2013-12-12 NOTE — [facility] Counselor (Addendum)
TC to family friend Margaretmary Dys [phone]. Quintella Reichert says that pt has been well behaved while living w/ [place] for the past month. She says, "Aayden has been a little depressed because he feels passed around". Quintella Reichert says pt hasn't mentioned SI. She is unsure whether pt on probation or parole but she sts she hasn't been contacted by any type of officer. She reports she took him to [employer] and [employer] stated pt has had four different payees. She says K[place]on hasn't seen his guardian since Oct and that guardian moved out of town. Aiken sts she doesn't know the name of guardian or payee. Aiken sts she is encouraging pt to get his GED. She reports DSS can't be his payee anymore unless DSS is court ordered. She sts DSS reports he should have new Medicaid card in the next 7 days. Aiken sts pt can come back and live with him.   Evette Cristal, [place] Assessment Counselor

## 2013-12-12 NOTE — Consult Note (Signed)
Patient discussed and I agree with this note

## 2013-12-17 ENCOUNTER — Telehealth (HOSPITAL_COMMUNITY): Payer: Self-pay

## 2013-12-20 ENCOUNTER — Other Ambulatory Visit: Payer: Medicaid Other

## 2013-12-31 ENCOUNTER — Emergency Department (HOSPITAL_COMMUNITY): Payer: Medicaid Other

## 2013-12-31 ENCOUNTER — Inpatient Hospital Stay (HOSPITAL_COMMUNITY): Payer: Medicaid Other | Admitting: Anesthesiology

## 2013-12-31 ENCOUNTER — Encounter (HOSPITAL_COMMUNITY): Payer: Medicaid Other | Admitting: Anesthesiology

## 2013-12-31 ENCOUNTER — Inpatient Hospital Stay (HOSPITAL_COMMUNITY)
Admission: EM | Admit: 2013-12-31 | Discharge: 2014-01-27 | DRG: 003 | Disposition: A | Discharge status: Skilled Nursing Facility | Payer: Medicaid Other | Attending: Neurosurgery | Admitting: Neurosurgery

## 2013-12-31 ENCOUNTER — Encounter (HOSPITAL_COMMUNITY): Admission: EM | Disposition: A | Discharge status: Skilled Nursing Facility | Payer: Self-pay | Source: Home / Self Care

## 2013-12-31 ENCOUNTER — Inpatient Hospital Stay (HOSPITAL_COMMUNITY): Payer: Medicaid Other

## 2013-12-31 ENCOUNTER — Encounter (HOSPITAL_COMMUNITY): Payer: Self-pay | Admitting: Radiology

## 2013-12-31 DIAGNOSIS — B952 Enterococcus as the cause of diseases classified elsewhere: Secondary | ICD-10-CM | POA: Diagnosis present

## 2013-12-31 DIAGNOSIS — E861 Hypovolemia: Secondary | ICD-10-CM | POA: Diagnosis not present

## 2013-12-31 DIAGNOSIS — S82209A Unspecified fracture of shaft of unspecified tibia, initial encounter for closed fracture: Secondary | ICD-10-CM | POA: Diagnosis present

## 2013-12-31 DIAGNOSIS — I10 Essential (primary) hypertension: Secondary | ICD-10-CM | POA: Diagnosis present

## 2013-12-31 DIAGNOSIS — I498 Other specified cardiac arrhythmias: Secondary | ICD-10-CM | POA: Diagnosis present

## 2013-12-31 DIAGNOSIS — IMO0002 Reserved for concepts with insufficient information to code with codable children: Secondary | ICD-10-CM | POA: Diagnosis present

## 2013-12-31 DIAGNOSIS — D62 Acute posthemorrhagic anemia: Secondary | ICD-10-CM | POA: Diagnosis present

## 2013-12-31 DIAGNOSIS — R4020 Unspecified coma: Secondary | ICD-10-CM | POA: Diagnosis not present

## 2013-12-31 DIAGNOSIS — S0280XA Fracture of other specified skull and facial bones, unspecified side, initial encounter for closed fracture: Secondary | ICD-10-CM | POA: Diagnosis present

## 2013-12-31 DIAGNOSIS — S2232XA Fracture of one rib, left side, initial encounter for closed fracture: Secondary | ICD-10-CM

## 2013-12-31 DIAGNOSIS — G9340 Encephalopathy, unspecified: Secondary | ICD-10-CM | POA: Diagnosis present

## 2013-12-31 DIAGNOSIS — E872 Acidosis, unspecified: Secondary | ICD-10-CM | POA: Diagnosis present

## 2013-12-31 DIAGNOSIS — S12600A Unspecified displaced fracture of seventh cervical vertebra, initial encounter for closed fracture: Secondary | ICD-10-CM | POA: Diagnosis present

## 2013-12-31 DIAGNOSIS — I4729 Other ventricular tachycardia: Secondary | ICD-10-CM | POA: Diagnosis not present

## 2013-12-31 DIAGNOSIS — I472 Ventricular tachycardia, unspecified: Secondary | ICD-10-CM | POA: Diagnosis not present

## 2013-12-31 DIAGNOSIS — S065X9A Traumatic subdural hemorrhage with loss of consciousness of unspecified duration, initial encounter: Secondary | ICD-10-CM | POA: Diagnosis not present

## 2013-12-31 DIAGNOSIS — E119 Type 2 diabetes mellitus without complications: Secondary | ICD-10-CM | POA: Diagnosis present

## 2013-12-31 DIAGNOSIS — R45851 Suicidal ideations: Secondary | ICD-10-CM | POA: Diagnosis not present

## 2013-12-31 DIAGNOSIS — S27329A Contusion of lung, unspecified, initial encounter: Secondary | ICD-10-CM | POA: Diagnosis present

## 2013-12-31 DIAGNOSIS — F17200 Nicotine dependence, unspecified, uncomplicated: Secondary | ICD-10-CM | POA: Diagnosis present

## 2013-12-31 DIAGNOSIS — S065XAA Traumatic subdural hemorrhage with loss of consciousness status unknown, initial encounter: Secondary | ICD-10-CM | POA: Diagnosis present

## 2013-12-31 DIAGNOSIS — J9600 Acute respiratory failure, unspecified whether with hypoxia or hypercapnia: Secondary | ICD-10-CM | POA: Diagnosis present

## 2013-12-31 DIAGNOSIS — S02609A Fracture of mandible, unspecified, initial encounter for closed fracture: Secondary | ICD-10-CM

## 2013-12-31 DIAGNOSIS — S0266XA Fracture of symphysis of mandible, initial encounter for closed fracture: Secondary | ICD-10-CM | POA: Diagnosis present

## 2013-12-31 DIAGNOSIS — Z9911 Dependence on respirator [ventilator] status: Secondary | ICD-10-CM | POA: Diagnosis not present

## 2013-12-31 DIAGNOSIS — I6200 Nontraumatic subdural hemorrhage, unspecified: Secondary | ICD-10-CM | POA: Diagnosis present

## 2013-12-31 DIAGNOSIS — G936 Cerebral edema: Secondary | ICD-10-CM | POA: Diagnosis present

## 2013-12-31 DIAGNOSIS — S82202A Unspecified fracture of shaft of left tibia, initial encounter for closed fracture: Secondary | ICD-10-CM

## 2013-12-31 DIAGNOSIS — S2239XA Fracture of one rib, unspecified side, initial encounter for closed fracture: Secondary | ICD-10-CM | POA: Diagnosis present

## 2013-12-31 DIAGNOSIS — N390 Urinary tract infection, site not specified: Secondary | ICD-10-CM | POA: Diagnosis not present

## 2013-12-31 DIAGNOSIS — S82402A Unspecified fracture of shaft of left fibula, initial encounter for closed fracture: Secondary | ICD-10-CM

## 2013-12-31 DIAGNOSIS — S0292XA Unspecified fracture of facial bones, initial encounter for closed fracture: Secondary | ICD-10-CM

## 2013-12-31 DIAGNOSIS — F209 Schizophrenia, unspecified: Secondary | ICD-10-CM | POA: Diagnosis present

## 2013-12-31 HISTORY — DX: Personal history of other (healed) physical injury and trauma: Z87.828

## 2013-12-31 HISTORY — DX: Traumatic subdural hemorrhage with loss of consciousness of unspecified duration, initial encounter: S06.5X9A

## 2013-12-31 HISTORY — DX: Essential (primary) hypertension: I10

## 2013-12-31 HISTORY — DX: Type 2 diabetes mellitus without complications: E11.9

## 2013-12-31 HISTORY — PX: CRANIOTOMY: SHX93

## 2013-12-31 HISTORY — DX: Traumatic subdural hemorrhage with loss of consciousness status unknown, initial encounter: S06.5XAA

## 2013-12-31 LAB — BLOOD GAS, ARTERIAL
ACID-BASE DEFICIT: 2.5 mmol/L — AB (ref 0.0–2.0)
ACID-BASE DEFICIT: 0.3 mmol/L (ref 0.0–2.0)
Acid-base deficit: 3.6 mmol/L — ABNORMAL HIGH (ref 0.0–2.0)
BICARBONATE: 21.4 meq/L (ref 20.0–24.0)
Bicarbonate: 22 mEq/L (ref 20.0–24.0)
Bicarbonate: 23.6 mEq/L (ref 20.0–24.0)
DRAWN BY: 24480
DRAWN BY: 244801
DRAWN BY: 244801
FIO2: 0.6 %
FIO2: 0.5 %
FIO2: 0.5 %
LHR: 14 {breaths}/min
LHR: 14 {breaths}/min
MECHVT: 500 mL
O2 SAT: 99.5 %
O2 Saturation: 99 %
O2 Saturation: 99.7 %
PATIENT TEMPERATURE: 98.6
PCO2 ART: 38.7 mmHg (ref 35.0–45.0)
PCO2 ART: 42.8 mmHg (ref 35.0–45.0)
PEEP/CPAP: 5 cmH2O
PEEP: 5 cmH2O
PEEP: 5 cmH2O
PH ART: 7.321 — AB (ref 7.350–7.450)
PO2 ART: 163 mmHg — AB (ref 80.0–100.0)
Patient temperature: 98.6
Patient temperature: 98.6
RATE: 16 resp/min
TCO2: 23.1 mmol/L (ref 0–100)
TCO2: 22.8 mmol/L (ref 0–100)
TCO2: 24.8 mmol/L (ref 0–100)
VT: 620 mL
VT: 500 mL
pCO2 arterial: 37.5 mmHg (ref 35.0–45.0)
pH, Arterial: 7.373 (ref 7.350–7.450)
pH, Arterial: 7.416 (ref 7.350–7.450)
pO2, Arterial: 105 mmHg — ABNORMAL HIGH (ref 80.0–100.0)
pO2, Arterial: 200 mmHg — ABNORMAL HIGH (ref 80.0–100.0)

## 2013-12-31 LAB — I-STAT CHEM 8, ED
BUN: 10 mg/dL (ref 6–23)
CREATININE: 1.1 mg/dL (ref 0.50–1.35)
Calcium, Ion: 1.15 mmol/L (ref 1.12–1.23)
Chloride: 103 mEq/L (ref 96–112)
GLUCOSE: 119 mg/dL — AB (ref 70–99)
HCT: 50 % (ref 39.0–52.0)
Hemoglobin: 17 g/dL (ref 13.0–17.0)
Potassium: 3.8 mEq/L (ref 3.7–5.3)
Sodium: 144 mEq/L (ref 137–147)
TCO2: 26 mmol/L (ref 0–100)

## 2013-12-31 LAB — CBC
HCT: 39.8 % (ref 39.0–52.0)
HCT: 34.7 % — ABNORMAL LOW (ref 39.0–52.0)
Hemoglobin: 12.8 g/dL — ABNORMAL LOW (ref 13.0–17.0)
Hemoglobin: 11.1 g/dL — ABNORMAL LOW (ref 13.0–17.0)
MCH: 23.6 pg — AB (ref 26.0–34.0)
MCH: 23.1 pg — ABNORMAL LOW (ref 26.0–34.0)
MCHC: 32.2 g/dL (ref 30.0–36.0)
MCHC: 32 g/dL (ref 30.0–36.0)
MCV: 73.3 fL — ABNORMAL LOW (ref 78.0–100.0)
MCV: 72.3 fL — AB (ref 78.0–100.0)
PLATELETS: 238 10*3/uL (ref 150–400)
Platelets: 211 10*3/uL (ref 150–400)
RBC: 5.43 MIL/uL (ref 4.22–5.81)
RBC: 4.8 MIL/uL (ref 4.22–5.81)
RDW: 15.5 % (ref 11.5–15.5)
RDW: 15.4 % (ref 11.5–15.5)
WBC: 21.3 10*3/uL — ABNORMAL HIGH (ref 4.0–10.5)
WBC: 18.6 10*3/uL — AB (ref 4.0–10.5)

## 2013-12-31 LAB — COMPREHENSIVE METABOLIC PANEL
ALT: 46 U/L (ref 0–53)
AST: 97 U/L — ABNORMAL HIGH (ref 0–37)
Albumin: 3.6 g/dL (ref 3.5–5.2)
Alkaline Phosphatase: 81 U/L (ref 39–117)
BILIRUBIN TOTAL: 0.2 mg/dL — AB (ref 0.3–1.2)
BUN: 10 mg/dL (ref 6–23)
CALCIUM: 8.5 mg/dL (ref 8.4–10.5)
CO2: 18 meq/L — AB (ref 19–32)
CREATININE: 1.03 mg/dL (ref 0.50–1.35)
Chloride: 101 mEq/L (ref 96–112)
GFR calc Af Amer: >90 mL/min (ref >90)
GLUCOSE: 162 mg/dL — AB (ref 70–99)
Potassium: 4.1 mEq/L (ref 3.7–5.3)
Sodium: 137 mEq/L (ref 137–147)
Total Protein: 6.6 g/dL (ref 6.0–8.3)

## 2013-12-31 LAB — BASIC METABOLIC PANEL
BUN: 9 mg/dL (ref 6–23)
CHLORIDE: 105 meq/L (ref 96–112)
CO2: 21 mEq/L (ref 19–32)
Calcium: 8.3 mg/dL — ABNORMAL LOW (ref 8.4–10.5)
Creatinine, Ser: 1 mg/dL (ref 0.50–1.35)
GFR calc Af Amer: >90 mL/min (ref >90)
GLUCOSE: 193 mg/dL — AB (ref 70–99)
Potassium: 2.9 mEq/L — CL (ref 3.7–5.3)
Sodium: 141 mEq/L (ref 137–147)

## 2013-12-31 LAB — TYPE AND SCREEN
ABO/RH(D): B POS
ANTIBODY SCREEN: NEGATIVE
UNIT DIVISION: 0
Unit division: 0

## 2013-12-31 LAB — PREPARE FRESH FROZEN PLASMA
UNIT DIVISION: 0
Unit division: 0

## 2013-12-31 LAB — I-STAT ARTERIAL BLOOD GAS, ED
Acid-base deficit: 2 mmol/L (ref 0.0–2.0)
BICARBONATE: 24.8 meq/L — AB (ref 20.0–24.0)
O2 Saturation: 100 %
PH ART: 7.298 — AB (ref 7.350–7.450)
PO2 ART: 248 mmHg — AB (ref 80.0–100.0)
Patient temperature: 98.6
TCO2: 26 mmol/L (ref 0–100)
pCO2 arterial: 50.6 mmHg — ABNORMAL HIGH (ref 35.0–45.0)

## 2013-12-31 LAB — CDS SEROLOGY

## 2013-12-31 LAB — PROTIME-INR
INR: 1.59 — ABNORMAL HIGH (ref 0.00–1.49)
Prothrombin Time: 18.5 seconds — ABNORMAL HIGH (ref 11.6–15.2)

## 2013-12-31 LAB — ABO/RH: ABO/RH(D): B POS

## 2013-12-31 LAB — I-STAT CG4 LACTIC ACID, ED: Lactic Acid, Venous: 3.14 mmol/L — ABNORMAL HIGH (ref 0.5–2.2)

## 2013-12-31 LAB — MRSA PCR SCREENING: MRSA by PCR: NEGATIVE

## 2013-12-31 LAB — ETHANOL

## 2013-12-31 SURGERY — CRANIOTOMY HEMATOMA EVACUATION SUBDURAL
Anesthesia: General | Site: Head

## 2013-12-31 MED ORDER — FENTANYL CITRATE 0.05 MG/ML IJ SOLN
INTRAMUSCULAR | Status: AC
  Filled 2013-12-31: qty 5

## 2013-12-31 MED ORDER — POTASSIUM CHLORIDE 10 MEQ/100ML IV SOLN
10.0000 meq | INTRAVENOUS | Status: AC
  Administered 2013-12-31 (×4): 10 meq via INTRAVENOUS
  Filled 2013-12-31 (×4): qty 100

## 2013-12-31 MED ORDER — BACITRACIN ZINC 500 UNIT/GM EX OINT
TOPICAL_OINTMENT | CUTANEOUS | Status: DC | PRN
  Administered 2013-12-31: 1 via TOPICAL

## 2013-12-31 MED ORDER — ONDANSETRON HCL 4 MG/2ML IJ SOLN
4.0000 mg | INTRAMUSCULAR | Status: DC | PRN
  Administered 2014-01-16: 4 mg via INTRAVENOUS
  Filled 2013-12-31: qty 2

## 2013-12-31 MED ORDER — MIDAZOLAM HCL 2 MG/2ML IJ SOLN
INTRAMUSCULAR | Status: AC
  Filled 2013-12-31: qty 6

## 2013-12-31 MED ORDER — CEFAZOLIN SODIUM-DEXTROSE 2-3 GM-% IV SOLR
2.0000 g | Freq: Three times a day (TID) | INTRAVENOUS | Status: AC
  Administered 2013-12-31 (×2): 2 g via INTRAVENOUS
  Filled 2013-12-31 (×2): qty 50

## 2013-12-31 MED ORDER — 0.9 % SODIUM CHLORIDE (POUR BTL) OPTIME
TOPICAL | Status: DC | PRN
  Administered 2013-12-31: 1000 mL

## 2013-12-31 MED ORDER — ROCURONIUM BROMIDE 100 MG/10ML IV SOLN
INTRAVENOUS | Status: DC | PRN
  Administered 2013-12-31 (×2): 50 mg via INTRAVENOUS

## 2013-12-31 MED ORDER — PHENYLEPHRINE HCL 10 MG/ML IJ SOLN
10.0000 mg | INTRAVENOUS | Status: DC | PRN
  Administered 2013-12-31: 25 ug/min via INTRAVENOUS

## 2013-12-31 MED ORDER — FENTANYL CITRATE 0.05 MG/ML IJ SOLN
INTRAMUSCULAR | Status: DC | PRN
  Administered 2013-12-31: 50 ug via INTRAVENOUS
  Administered 2013-12-31 (×2): 100 ug via INTRAVENOUS

## 2013-12-31 MED ORDER — SUCCINYLCHOLINE CHLORIDE 20 MG/ML IJ SOLN
INTRAMUSCULAR | Status: AC | PRN
  Administered 2013-12-31: 150 mg via INTRAVENOUS

## 2013-12-31 MED ORDER — CHLORHEXIDINE GLUCONATE 0.12 % MT SOLN
15.0000 mL | Freq: Two times a day (BID) | OROMUCOSAL | Status: DC
Start: 2013-12-31 — End: 2014-01-27
  Administered 2013-12-31 – 2014-01-27 (×54): 15 mL via OROMUCOSAL
  Filled 2013-12-31 (×56): qty 15

## 2013-12-31 MED ORDER — PANTOPRAZOLE SODIUM 40 MG IV SOLR
40.0000 mg | Freq: Every day | INTRAVENOUS | Status: DC
  Administered 2013-12-31 – 2014-01-09 (×8): 40 mg via INTRAVENOUS
  Filled 2013-12-31 (×12): qty 40

## 2013-12-31 MED ORDER — ROCURONIUM BROMIDE 50 MG/5ML IV SOLN
INTRAVENOUS | Status: AC | PRN
  Administered 2013-12-31: 50 mg via INTRAVENOUS

## 2013-12-31 MED ORDER — FENTANYL CITRATE 0.05 MG/ML IJ SOLN
INTRAMUSCULAR | Status: AC
  Filled 2013-12-31: qty 4

## 2013-12-31 MED ORDER — ETOMIDATE 2 MG/ML IV SOLN
INTRAVENOUS | Status: AC
  Filled 2013-12-31: qty 20

## 2013-12-31 MED ORDER — PROPOFOL 10 MG/ML IV EMUL
5.0000 ug/kg/min | INTRAVENOUS | Status: DC
  Administered 2013-12-31 (×3): 30 ug/kg/min via INTRAVENOUS
  Administered 2013-12-31 – 2014-01-01 (×2): 27 ug/kg/min via INTRAVENOUS
  Administered 2014-01-01 (×2): 20 ug/kg/min via INTRAVENOUS
  Administered 2014-01-02 – 2014-01-03 (×3): 10 ug/kg/min via INTRAVENOUS
  Administered 2014-01-03 – 2014-01-04 (×2): 15 ug/kg/min via INTRAVENOUS
  Filled 2013-12-31 (×12): qty 100

## 2013-12-31 MED ORDER — PANTOPRAZOLE SODIUM 40 MG IV SOLR: 40.0000 mg | Freq: Every day | INTRAVENOUS | Status: DC

## 2013-12-31 MED ORDER — ETOMIDATE 2 MG/ML IV SOLN
INTRAVENOUS | Status: AC | PRN
  Administered 2013-12-31: 30 mg via INTRAVENOUS

## 2013-12-31 MED ORDER — SODIUM CHLORIDE 0.9 % IV SOLN
500.0000 mg | Freq: Two times a day (BID) | INTRAVENOUS | Status: DC
  Administered 2013-12-31 – 2014-01-09 (×19): 500 mg via INTRAVENOUS
  Filled 2013-12-31 (×21): qty 5

## 2013-12-31 MED ORDER — MANNITOL 25 % IV SOLN
25.0000 g | Freq: Once | INTRAVENOUS | Status: AC
  Administered 2013-12-31: 25 g via INTRAVENOUS
  Filled 2013-12-31: qty 100

## 2013-12-31 MED ORDER — ONDANSETRON HCL 4 MG PO TABS: 4.0000 mg | ORAL_TABLET | Freq: Four times a day (QID) | ORAL | Status: DC | PRN

## 2013-12-31 MED ORDER — SODIUM CHLORIDE 0.9 % IV SOLN
INTRAVENOUS | Status: DC | PRN
  Administered 2013-12-31 (×2): via INTRAVENOUS

## 2013-12-31 MED ORDER — BIOTENE DRY MOUTH MT LIQD
15.0000 mL | Freq: Four times a day (QID) | OROMUCOSAL | Status: DC
  Administered 2013-12-31 – 2014-01-27 (×106): 15 mL via OROMUCOSAL

## 2013-12-31 MED ORDER — PANTOPRAZOLE SODIUM 40 MG PO TBEC: 40.0000 mg | DELAYED_RELEASE_TABLET | Freq: Every day | ORAL | Status: DC

## 2013-12-31 MED ORDER — LABETALOL HCL 5 MG/ML IV SOLN: 10.0000 mg | INTRAVENOUS | Status: DC | PRN

## 2013-12-31 MED ORDER — CEFAZOLIN SODIUM-DEXTROSE 2-3 GM-% IV SOLR
INTRAVENOUS | Status: DC | PRN
  Administered 2013-12-31: 2 g via INTRAVENOUS

## 2013-12-31 MED ORDER — SODIUM CHLORIDE 0.9 % IV SOLN
INTRAVENOUS | Status: DC | PRN
  Administered 2013-12-31: 05:00:00 via INTRAVENOUS

## 2013-12-31 MED ORDER — MICROFIBRILLAR COLL HEMOSTAT EX PADS
MEDICATED_PAD | CUTANEOUS | Status: DC | PRN
  Administered 2013-12-31: 1 via TOPICAL

## 2013-12-31 MED ORDER — SODIUM CHLORIDE 0.9 % IV SOLN: INTRAVENOUS | Status: DC

## 2013-12-31 MED ORDER — LIDOCAINE-EPINEPHRINE 1 %-1:100000 IJ SOLN
INTRAMUSCULAR | Status: DC | PRN
Start: 2013-12-31 — End: 2013-12-31
  Administered 2013-12-31: 18 mL via INTRADERMAL

## 2013-12-31 MED ORDER — IOHEXOL 300 MG/ML  SOLN
100.0000 mL | Freq: Once | INTRAMUSCULAR | Status: AC | PRN
  Administered 2013-12-31: 100 mL via INTRAVENOUS

## 2013-12-31 MED ORDER — ONDANSETRON HCL 4 MG/2ML IJ SOLN: 4.0000 mg | Freq: Four times a day (QID) | INTRAMUSCULAR | Status: DC | PRN

## 2013-12-31 MED ORDER — ONDANSETRON HCL 4 MG PO TABS: 4.0000 mg | ORAL_TABLET | ORAL | Status: DC | PRN

## 2013-12-31 MED ORDER — MORPHINE SULFATE 4 MG/ML IJ SOLN: 4.0000 mg | INTRAMUSCULAR | Status: DC | PRN

## 2013-12-31 MED ORDER — METOPROLOL TARTRATE 1 MG/ML IV SOLN
5.0000 mg | INTRAVENOUS | Status: DC | PRN
  Administered 2013-12-31: 5 mg via INTRAVENOUS
  Filled 2013-12-31: qty 5

## 2013-12-31 MED ORDER — GELATIN ABSORBABLE MT POWD
OROMUCOSAL | Status: DC | PRN
  Administered 2013-12-31 (×2): via TOPICAL

## 2013-12-31 MED ORDER — PROMETHAZINE HCL 25 MG PO TABS
12.5000 mg | ORAL_TABLET | ORAL | Status: DC | PRN
  Administered 2014-01-11: 25 mg via ORAL
  Filled 2013-12-31: qty 1

## 2013-12-31 MED ORDER — ACETAMINOPHEN 160 MG/5ML PO SOLN
650.0000 mg | Freq: Four times a day (QID) | ORAL | Status: DC | PRN
  Administered 2013-12-31 – 2014-01-26 (×6): 650 mg
  Filled 2013-12-31 (×6): qty 20.3

## 2013-12-31 MED ORDER — POTASSIUM CHLORIDE IN NACL 20-0.9 MEQ/L-% IV SOLN
INTRAVENOUS | Status: DC
Start: 2013-12-31 — End: 2014-01-06
  Administered 2013-12-31 – 2014-01-06 (×15): via INTRAVENOUS
  Filled 2013-12-31 (×18): qty 1000

## 2013-12-31 MED ORDER — PROPOFOL 10 MG/ML IV BOLUS
INTRAVENOUS | Status: AC
  Filled 2013-12-31: qty 20

## 2013-12-31 MED ORDER — LIDOCAINE HCL (CARDIAC) 20 MG/ML IV SOLN
INTRAVENOUS | Status: AC
  Filled 2013-12-31: qty 5

## 2013-12-31 MED ORDER — MANNITOL 25 % IV SOLN
INTRAVENOUS | Status: DC | PRN
  Administered 2013-12-31: 25 g via INTRAVENOUS

## 2013-12-31 MED ORDER — SUCCINYLCHOLINE CHLORIDE 20 MG/ML IJ SOLN
INTRAMUSCULAR | Status: AC
  Filled 2013-12-31: qty 1

## 2013-12-31 MED ORDER — THROMBIN 20000 UNITS EX KIT
PACK | CUTANEOUS | Status: DC | PRN
  Administered 2013-12-31: 06:00:00 via TOPICAL

## 2013-12-31 MED ORDER — ROCURONIUM BROMIDE 50 MG/5ML IV SOLN
INTRAVENOUS | Status: AC
  Filled 2013-12-31: qty 2

## 2013-12-31 SURGICAL SUPPLY — 66 items
BANDAGE GAUZE 4  KLING STR (GAUZE/BANDAGES/DRESSINGS) IMPLANT
BIT DRILL WIRE PASS 1.3MM (BIT) IMPLANT
BLADE 10 SAFETY STRL DISP (BLADE) ×3 IMPLANT
BLADE SURG ROTATE 9660 (MISCELLANEOUS) ×2 IMPLANT
BRUSH SCRUB EZ PLAIN DRY (MISCELLANEOUS) ×1 IMPLANT
BUR ACORN 6.0 PRECISION (BURR) ×2 IMPLANT
BUR ACORN 6.0MM PRECISION (BURR) ×1
BUR ROUTER D-58 CRANI (BURR) ×2 IMPLANT
CANISTER SUCT 3000ML (MISCELLANEOUS) ×3 IMPLANT
CONT SPEC 4OZ CLIKSEAL STRL BL (MISCELLANEOUS) ×3 IMPLANT
CORDS BIPOLAR (ELECTRODE) ×3 IMPLANT
DRAIN SNY WOU 7FLT (WOUND CARE) IMPLANT
DRAPE SURG 17X23 STRL (DRAPES) ×2 IMPLANT
DRAPE SURG IRRIG POUCH 19X23 (DRAPES) IMPLANT
DRAPE WARM FLUID 44X44 (DRAPE) ×3 IMPLANT
DRILL WIRE PASS 1.3MM (BIT)
DURAPREP 6ML APPLICATOR 50/CS (WOUND CARE) ×3 IMPLANT
ELECT CAUTERY BLADE 6.4 (BLADE) ×3 IMPLANT
ELECT REM PT RETURN 9FT ADLT (ELECTROSURGICAL) ×3
ELECTRODE REM PT RTRN 9FT ADLT (ELECTROSURGICAL) ×1 IMPLANT
EVACUATOR 1/8 PVC DRAIN (DRAIN) IMPLANT
EVACUATOR SILICONE 100CC (DRAIN) IMPLANT
GAUZE SPONGE 4X4 16PLY XRAY LF (GAUZE/BANDAGES/DRESSINGS) IMPLANT
GLOVE BIO SURGEON STRL SZ7 (GLOVE) ×2 IMPLANT
GLOVE BIOGEL M 8.0 STRL (GLOVE) ×5 IMPLANT
GLOVE EXAM NITRILE LRG STRL (GLOVE) IMPLANT
GLOVE EXAM NITRILE MD LF STRL (GLOVE) IMPLANT
GLOVE EXAM NITRILE XL STR (GLOVE) IMPLANT
GLOVE EXAM NITRILE XS STR PU (GLOVE) IMPLANT
GLOVE INDICATOR 7.5 STRL GRN (GLOVE) ×2 IMPLANT
GOWN STRL REUS W/ TWL LRG LVL3 (GOWN DISPOSABLE) ×3 IMPLANT
GOWN STRL REUS W/ TWL XL LVL3 (GOWN DISPOSABLE) IMPLANT
GOWN STRL REUS W/TWL 2XL LVL3 (GOWN DISPOSABLE) IMPLANT
GOWN STRL REUS W/TWL LRG LVL3 (GOWN DISPOSABLE) ×9
GOWN STRL REUS W/TWL XL LVL3 (GOWN DISPOSABLE)
GRAFT DURAGEN MATRIX 2WX2L ×2 IMPLANT
HEMOSTAT SURGICEL 2X14 (HEMOSTASIS) ×3 IMPLANT
HOOK DURA (MISCELLANEOUS) ×3 IMPLANT
KIT BASIN OR (CUSTOM PROCEDURE TRAY) ×3 IMPLANT
KIT ROOM TURNOVER OR (KITS) ×3 IMPLANT
NDL HYPO 25X1 1.5 SAFETY (NEEDLE) IMPLANT
NEEDLE HYPO 25X1 1.5 SAFETY (NEEDLE) ×3 IMPLANT
NS IRRIG 1000ML POUR BTL (IV SOLUTION) ×3 IMPLANT
PACK CRANIOTOMY (CUSTOM PROCEDURE TRAY) ×3 IMPLANT
PAD ABD 8X10 STRL (GAUZE/BANDAGES/DRESSINGS) IMPLANT
PAD ARMBOARD 7.5X6 YLW CONV (MISCELLANEOUS) ×3 IMPLANT
PATTIES SURGICAL .5 X.5 (GAUZE/BANDAGES/DRESSINGS) IMPLANT
PATTIES SURGICAL .5 X3 (DISPOSABLE) IMPLANT
PATTIES SURGICAL 1X1 (DISPOSABLE) IMPLANT
PIN MAYFIELD SKULL DISP (PIN) IMPLANT
SPONGE GAUZE 4X4 12PLY (GAUZE/BANDAGES/DRESSINGS) ×4 IMPLANT
SPONGE NEURO XRAY DETECT 1X3 (DISPOSABLE) IMPLANT
SPONGE SURGIFOAM ABS GEL 100 (HEMOSTASIS) ×3 IMPLANT
STAPLER SKIN PROX WIDE 3.9 (STAPLE) ×3 IMPLANT
SUT NURALON 4 0 TR CR/8 (SUTURE) ×4 IMPLANT
SUT VIC AB 2-0 CP2 18 (SUTURE) ×6 IMPLANT
SYR 20ML ECCENTRIC (SYRINGE) ×3 IMPLANT
SYR CONTROL 10ML LL (SYRINGE) ×2 IMPLANT
TAPE CLOTH SURG 4X10 WHT LF (GAUZE/BANDAGES/DRESSINGS) ×4 IMPLANT
TOWEL OR 17X24 6PK STRL BLUE (TOWEL DISPOSABLE) ×3 IMPLANT
TOWEL OR 17X26 10 PK STRL BLUE (TOWEL DISPOSABLE) ×3 IMPLANT
TRAY FOLEY CATH 14FRSI W/METER (CATHETERS) IMPLANT
TUBE CONNECTING 12'X1/4 (SUCTIONS) ×1
TUBE CONNECTING 12X1/4 (SUCTIONS) ×2 IMPLANT
UNDERPAD 30X30 INCONTINENT (UNDERPADS AND DIAPERS) IMPLANT
WATER STERILE IRR 1000ML POUR (IV SOLUTION) ×3 IMPLANT
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
## 2013-12-31 NOTE — Progress Notes (Signed)
Patient ID: Victor [name], male   DOB: February 27, 1991, 23 y.o.   MRN: [id number] Sedated. Pupils . Both subdural drains working well. Has a fracture ot the lateral transverse process which DO NOT require surgical intervention
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
## 2013-12-31 NOTE — Plan of Care (Signed)
Problem: Consults Goal: Severe Traumatic Brain Injury Patient Education See Patient Education Module for education specifics. Outcome: Completed/Met Date Met:  12/31/13 Information provided to family (uncle and mother)

## 2013-12-31 NOTE — ED Notes (Signed)
Pt transported to CT ?
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
## 2013-12-31 NOTE — ED Notes (Signed)
Uncle contacted regarding pt being present in ED. States he is Enroute to ED
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
## 2013-12-31 NOTE — Progress Notes (Signed)
Patient transported to CT and back to ER without any complications.   RT will continue to monitor.

## 2013-12-31 NOTE — Anesthesia Postprocedure Evaluation (Signed)
  Anesthesia Post-op Note  Patient: Victor [name]  Procedure(s) Performed: Procedure(s): CRANIECTOMY HEMATOMA EVACUATION SUBDURAL, BONE FLAP PLACED IN ABDOMEN (N/A)  Patient Location: PACU and NICU  Anesthesia Type:General  Level of Consciousness: Patient remains intubated per anesthesia plan  Airway and Oxygen Therapy: Patient remains intubated per anesthesia plan  Post-op Pain: mild  Post-op Assessment: Post-op Vital signs reviewed  Post-op Vital Signs: Reviewed  Last Vitals:  Filed Vitals:   12/31/13 0748  BP: 135/62  Pulse: 77  Temp:   Resp: 15    Complications: No apparent anesthesia complications
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
## 2013-12-31 NOTE — Progress Notes (Addendum)
CRITICAL VALUE ALERT  Critical value received:  K = 2.9  Date of notification:  12/31/13  Time of notification:  1030.  Critical value read back: yes  Nurse who received alert:  Amie Critchley, RN  MD notified (1st page): Dr. Janee Morn  Time of first page:  1035.  MD notified (2nd page):  Time of second page:  Responding MD:  Dr. Janee Morn  Time MD responded:  1036.
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
## 2013-12-31 NOTE — Transfer of Care (Signed)
Immediate Anesthesia Transfer of Care Note  Patient: Victor [name]  Procedure(s) Performed: Procedure(s): CRANIECTOMY HEMATOMA EVACUATION SUBDURAL, BONE FLAP PLACED IN ABDOMEN (N/A)  Patient Location: PACU and NICU  Anesthesia Type:General  Level of Consciousness: obtunded  Airway & Oxygen Therapy: Patient remains intubated per anesthesia plan and Patient placed on Ventilator (see vital sign flow sheet for setting)  Post-op Assessment: Report given to PACU RN and Post -op Vital signs reviewed and stable  Post vital signs: Reviewed and stable  Complications: No apparent anesthesia complications
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
## 2013-12-31 NOTE — Progress Notes (Signed)
Patient transported to Neuro OR without any complications.

## 2013-12-31 NOTE — Progress Notes (Signed)
Patient ID: Victor [name], male   DOB: August 23, 1990, 23 y.o.   MRN: [id number] I spoke with his mother by phone and explained his injuries and condition. Violeta Gelinas, MD, MPH, FACS Trauma: [phone] General Surgery: [phone]

## 2013-12-31 NOTE — ED Notes (Signed)
Pt returned from CT °
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
## 2013-12-31 NOTE — Progress Notes (Signed)
UR completed.  Luara Faye, RN BSN MHA CCM Trauma/Neuro ICU Case Manager [phone]  

## 2013-12-31 NOTE — Progress Notes (Signed)
Orthopedic Tech Progress Note Patient Details:  Victor [name] 1991/04/12 [id number]  Ortho Devices Type of Ortho Device: Long leg splint;Stirrup splint Ortho Device/Splint Interventions: Application   Mickie Bail Cammer 12/31/2013, 12:20 PM

## 2014-01-01 ENCOUNTER — Inpatient Hospital Stay (HOSPITAL_COMMUNITY): Payer: Medicaid Other

## 2014-01-01 LAB — BASIC METABOLIC PANEL
BUN: 8 mg/dL (ref 6–23)
CALCIUM: 8.3 mg/dL — AB (ref 8.4–10.5)
CO2: 23 mEq/L (ref 19–32)
Chloride: 112 mEq/L (ref 96–112)
Creatinine, Ser: 1.06 mg/dL (ref 0.50–1.35)
GFR calc non Af Amer: >90 mL/min (ref >90)
GLUCOSE: 99 mg/dL (ref 70–99)
Potassium: 4.1 mEq/L (ref 3.7–5.3)
SODIUM: 148 meq/L — AB (ref 137–147)

## 2014-01-01 LAB — POCT I-STAT 7, (LYTES, BLD GAS, ICA,H+H)
ACID-BASE DEFICIT: 1 mmol/L (ref 0.0–2.0)
Bicarbonate: 22.2 mEq/L (ref 20.0–24.0)
Calcium, Ion: 1.09 mmol/L — ABNORMAL LOW (ref 1.12–1.23)
HEMATOCRIT: 37 % — AB (ref 39.0–52.0)
Hemoglobin: 12.6 g/dL — ABNORMAL LOW (ref 13.0–17.0)
O2 Saturation: 100 %
PH ART: 7.476 — AB (ref 7.350–7.450)
POTASSIUM: 2.8 meq/L — AB (ref 3.7–5.3)
Patient temperature: 35.7
SODIUM: 139 meq/L (ref 137–147)
TCO2: 23 mmol/L (ref 0–100)
pCO2 arterial: 29.8 mmHg — ABNORMAL LOW (ref 35.0–45.0)
pO2, Arterial: 429 mmHg — ABNORMAL HIGH (ref 80.0–100.0)

## 2014-01-01 LAB — BLOOD PRODUCT ORDER (VERBAL) VERIFICATION

## 2014-01-01 LAB — CBC
HEMATOCRIT: 30.7 % — AB (ref 39.0–52.0)
HEMOGLOBIN: 9.8 g/dL — AB (ref 13.0–17.0)
MCH: 23.3 pg — AB (ref 26.0–34.0)
MCHC: 31.9 g/dL (ref 30.0–36.0)
MCV: 72.9 fL — ABNORMAL LOW (ref 78.0–100.0)
Platelets: 168 10*3/uL (ref 150–400)
RBC: 4.21 MIL/uL — ABNORMAL LOW (ref 4.22–5.81)
RDW: 15.7 % — ABNORMAL HIGH (ref 11.5–15.5)
WBC: 13.4 10*3/uL — ABNORMAL HIGH (ref 4.0–10.5)

## 2014-01-01 LAB — BLOOD GAS, ARTERIAL
Acid-Base Excess: 0.1 mmol/L (ref 0.0–2.0)
Bicarbonate: 24.4 mEq/L — ABNORMAL HIGH (ref 20.0–24.0)
Drawn by: 331761
FIO2: 0.4 %
MECHVT: 500 mL
O2 Saturation: 98.8 %
PEEP/CPAP: 5 cmH2O
PO2 ART: 135 mmHg — AB (ref 80.0–100.0)
Patient temperature: 100.6
RATE: 14 resp/min
TCO2: 25.7 mmol/L (ref 0–100)
pCO2 arterial: 43.6 mmHg (ref 35.0–45.0)
pH, Arterial: 7.373 (ref 7.350–7.450)

## 2014-01-01 MED ORDER — FENTANYL CITRATE 0.05 MG/ML IJ SOLN: 50.0000 ug | Freq: Once | INTRAMUSCULAR | Status: DC

## 2014-01-01 MED ORDER — SODIUM CHLORIDE 0.9 % IJ SOLN
10.0000 mL | INTRAMUSCULAR | Status: DC | PRN
  Administered 2014-01-04: 10 mL

## 2014-01-01 MED ORDER — CEFAZOLIN SODIUM-DEXTROSE 2-3 GM-% IV SOLR
2.0000 g | Freq: Once | INTRAVENOUS | Status: AC
  Administered 2014-01-02: 2 g via INTRAVENOUS
  Filled 2014-01-01: qty 50

## 2014-01-01 MED ORDER — SODIUM CHLORIDE 0.9 % IV SOLN
500.0000 mL | Freq: Once | INTRAVENOUS | Status: AC
  Administered 2014-01-01: 500 mL via INTRAVENOUS

## 2014-01-01 MED ORDER — FENTANYL BOLUS VIA INFUSION
50.0000 ug | INTRAVENOUS | Status: DC | PRN
  Filled 2014-01-01: qty 100

## 2014-01-01 MED ORDER — PIVOT 1.5 CAL PO LIQD
1000.0000 mL | ORAL | Status: DC
  Administered 2014-01-01 – 2014-01-04 (×4): 1000 mL
  Filled 2014-01-01 (×6): qty 1000

## 2014-01-01 MED ORDER — SODIUM CHLORIDE 0.9 % IV SOLN
0.0000 ug/h | INTRAVENOUS | Status: DC
  Administered 2014-01-01 – 2014-01-04 (×3): 50 ug/h via INTRAVENOUS
  Administered 2014-01-07 – 2014-01-08 (×2): 100 ug/h via INTRAVENOUS
  Filled 2014-01-01 (×7): qty 50

## 2014-01-01 MED ORDER — SODIUM CHLORIDE 0.9 % IJ SOLN
10.0000 mL | Freq: Two times a day (BID) | INTRAMUSCULAR | Status: DC
  Administered 2014-01-01 – 2014-01-02 (×2): 10 mL
  Administered 2014-01-03 – 2014-01-04 (×2): 20 mL
  Administered 2014-01-05 – 2014-01-07 (×4): 10 mL
  Administered 2014-01-07: 20 mL
  Administered 2014-01-08 (×2): 10 mL
  Administered 2014-01-09: 15 mL
  Administered 2014-01-09 – 2014-01-10 (×2): 10 mL
  Administered 2014-01-10: 20 mL
  Administered 2014-01-11 – 2014-01-13 (×3): 10 mL

## 2014-01-01 NOTE — Progress Notes (Signed)
Patient ID: Victor [name], male   DOB: 09-28-1990, 23 y.o.   MRN: [id number] Both drains working. Decerebrate posture with downward gaze. Continue on the respitator. Plan ct head. 1i will be out of town. My partners to f/u

## 2014-01-01 NOTE — Progress Notes (Signed)
Follow up - Trauma and Critical Care  Patient Details:    Victor [name] is an 23 y.[name]. male.  Lines/tubes : Airway 8 mm (Active)  Secured at (cm) 26 cm 01/01/2014  8:16 AM  Measured From Lips 01/01/2014  8:16 AM  Secured Location Right 01/01/2014  8:16 AM  Secured By [employer] 01/01/2014  8:16 AM  Tube Holder Repositioned Yes 01/01/2014  8:16 AM  Cuff Pressure (cm H2O) 25 cm H2O 12/31/2013  3:25 PM  Site Condition Dry 01/01/2014  8:16 AM     Arterial Line 12/31/13 Left Radial (Active)  Site Assessment Clean;Dry;Intact 12/31/2013  8:00 PM  Line Status Pulsatile blood flow 12/31/2013  8:00 PM  Art Line Waveform Appropriate 12/31/2013  8:00 PM  Art Line Interventions Zeroed and calibrated;Leveled;Connections checked and tightened 12/31/2013  8:00 PM  Color/Movement/Sensation Capillary refill less than 3 sec 12/31/2013  8:00 PM     Closed System Drain 1 Right Other (Comment) Accordion (Hemovac) 10 Fr. (Active)  Site Description Unable to view 01/01/2014  8:00 AM  Dressing Status Clean;Dry 01/01/2014  8:00 AM  Drainage Appearance Bloody 01/01/2014  8:00 AM  Status To suction (Charged) 01/01/2014  8:00 AM  Intake (mL) 100 ml 12/31/2013  4:00 PM  Output (mL) 95 mL 01/01/2014  5:00 AM     Closed System Drain 2 Right Other (Comment) Accordion (Hemovac) 10 Fr. (Active)  Site Description Unable to view 01/01/2014  8:00 AM  Dressing Status Clean;Dry 01/01/2014  8:00 AM  Drainage Appearance Bloody 01/01/2014  8:00 AM  Status To suction (Charged) 01/01/2014  8:00 AM  Intake (mL) 25 ml 12/31/2013  4:00 PM  Output (mL) 20 mL 01/01/2014  5:00 AM     NG/OG Tube Orogastric 16 Fr. Right mouth (Active)  Placement Verification Auscultation 01/01/2014  8:00 AM  Site Assessment Clean;Dry 01/01/2014  8:00 AM  Status Suction-low intermittent 01/01/2014  8:00 AM  Drainage Appearance Brown 01/01/2014  8:00 AM  Output (mL) 175 mL 01/01/2014  5:00 AM     Urethral Catheter Les NT Straight-tip;Temperature probe 16 Fr. (Active)   Indication for Insertion or Continuance of Catheter Unstable critical patients (first 24-48 hours) 01/01/2014  8:00 AM  Site Assessment Clean;Intact;Dry 01/01/2014  8:00 AM  Catheter Maintenance Bag below level of bladder;Catheter secured;Drainage bag/tubing not touching floor;Insertion date on drainage bag;No dependent loops;Seal intact 01/01/2014  8:00 AM  Collection Container Standard drainage bag 12/31/2013  8:00 PM  Securement Method Leg strap 01/01/2014  8:00 AM  Urinary Catheter Interventions Unclamped 01/01/2014  8:00 AM  Output (mL) 50 mL 01/01/2014  6:00 AM    Microbiology/Sepsis markers: Results for orders placed during the hospital encounter of 12/31/13  MRSA PCR SCREENING     Status: None   Collection Time    12/31/13  7:35 AM      Result Value Ref Range Status   MRSA by PCR NEGATIVE  NEGATIVE Final   Comment:            The GeneXpert MRSA Assay (FDA     approved for NASAL specimens     only), is one component of a     comprehensive MRSA colonization     surveillance program. It is not     intended to diagnose MRSA     infection nor to guide or     monitor treatment for     MRSA infections.    Anti-infectives:  Anti-infectives   Start     Dose/Rate Route  Frequency Ordered Stop   12/31/13 0800  ceFAZolin (ANCEF) IVPB 2 g/50 mL premix     2 g 100 mL/hr over 30 Minutes Intravenous 3 times per day 12/31/13 0729 12/31/13 1656      Best Practice/Protocols:  VTE Prophylaxis: Mechanical GI Prophylaxis: Proton Pump Inhibitor Continous Sedation  Consults: Treatment Team:  Karn Cassis, MD Budd Palmer, MD    Events:  Subjective:    Overnight Issues: Patient posturing throughout the evening.  Objective:  Vital signs for last 24 hours: Temp:  [99.7 F (37.6 C)-102.4 F (39.1 C)] 100.4 F (38 C) (06/10 0800) Pulse Rate:  [95-124] 103 (06/10 0816) Resp:  [14-34] 17 (06/10 0816) BP: (90-142)/(44-84) 134/65 mmHg (06/10 0816) SpO2:  [89 %-100 %] 100 %  (06/10 0816) Arterial Line BP: (83-176)/(54-94) 127/63 mmHg (06/10 0800) FiO2 (%):  [40 %-50 %] 40 % (06/10 0816)  Hemodynamic parameters for last 24 hours:    Intake/Output from previous day: 06/09 0701 - 06/10 0700 In: 3911.9 [I.V.:3076.9; IV Piggyback:710] Out: 2760 [Urine:2270; Emesis/NG output:375; Drains:115]  Intake/Output this shift: Total I/[name] In: 112 [I.V.:112] Out: 40 [Urine:40]  Vent settings for last 24 hours: Vent Mode:  [-] PRVC FiO2 (%):  [40 %-50 %] 40 % Set Rate:  [14 bmp] 14 bmp Vt Set:  [500 mL] 500 mL PEEP:  [5 cmH20] 5 cmH20 Plateau Pressure:  [15 cmH20-18 cmH20] 16 cmH20  Physical Exam:  Neuro: posturing, eyes gazed downward and  Resp: clear to auscultation bilaterally GI: soft, nontender, BS WNL, no r/g Extremities: Injury to right leg is fine, and just needs to be cleaned.  Results for orders placed during the hospital encounter of 12/31/13 (from the past 24 hour(s))  CBC     Status: Abnormal   Collection Time    12/31/13 10:10 AM      Result Value Ref Range   WBC 18.6 (*) 4.0 - 10.5 K/uL   RBC 4.80  4.22 - 5.81 MIL/uL   Hemoglobin 11.1 (*) 13.0 - 17.0 g/dL   HCT 91.4 (*) 78.2 - 95.6 %   MCV 72.3 (*) 78.0 - 100.0 fL   MCH 23.1 (*) 26.0 - 34.0 pg   MCHC 32.0  30.0 - 36.0 g/dL   RDW 21.3  08.6 - 57.8 %   Platelets 211  150 - 400 K/uL  BLOOD GAS, ARTERIAL     Status: Abnormal   Collection Time    12/31/13 12:15 PM      Result Value Ref Range   FIO2 0.50     Delivery systems VENTILATOR     Mode PRESSURE REGULATED VOLUME CONTROL     VT 500.00     Rate 14.0     Peep/cpap 5.0     pH, Arterial 7.416  7.350 - 7.450   pCO2 arterial 37.5  35.0 - 45.0 mmHg   pO2, Arterial 200.0 (*) 80.0 - 100.0 mmHg   Bicarbonate 23.6  20.0 - 24.0 mEq/L   TCO2 24.8  0 - 100 mmol/L   Acid-base deficit 0.3  0.0 - 2.0 mmol/L   O2 Saturation 99.5     Patient temperature 98.6     Collection site A-LINE     Drawn by [phone]     Sample type ARTERIAL DRAW     Allens  test (pass/fail) PASS  PASS  BLOOD PRODUCT ORDER (VERBAL) VERIFICATION     Status: None   Collection Time    01/01/14 12:30 AM  Result Value Ref Range   Blood product order confirm MD AUTHORIZATION REQUESTED    BLOOD PRODUCT ORDER (VERBAL) VERIFICATION     Status: None   Collection Time    01/01/14 12:30 AM      Result Value Ref Range   Blood product order confirm MD AUTHORIZATION REQUESTED    CBC     Status: Abnormal   Collection Time    01/01/14  4:27 AM      Result Value Ref Range   WBC 13.4 (*) 4.0 - 10.5 K/uL   RBC 4.21 (*) 4.22 - 5.81 MIL/uL   Hemoglobin 9.8 (*) 13.0 - 17.0 g/dL   HCT 62.130.7 (*) 30.839.0 - 65.752.0 %   MCV 72.9 (*) 78.0 - 100.0 fL   MCH 23.3 (*) 26.0 - 34.0 pg   MCHC 31.9  30.0 - 36.0 g/dL   RDW 84.615.7 (*) 96.211.5 - 95.215.5 %   Platelets 168  150 - 400 K/uL  BASIC METABOLIC PANEL     Status: Abnormal   Collection Time    01/01/14  4:27 AM      Result Value Ref Range   Sodium 148 (*) 137 - 147 mEq/L   Potassium 4.1  3.7 - 5.3 mEq/L   Chloride 112  96 - 112 mEq/L   CO2 23  19 - 32 mEq/L   Glucose, Bld 99  70 - 99 mg/dL   BUN 8  6 - 23 mg/dL   Creatinine, Ser 8.411.06  0.50 - 1.35 mg/dL   Calcium 8.3 (*) 8.4 - 10.5 mg/dL   GFR calc non Af Amer >90  >90 mL/min   GFR calc Af Amer >90  >90 mL/min  BLOOD GAS, ARTERIAL     Status: Abnormal   Collection Time    01/01/14  5:01 AM      Result Value Ref Range   FIO2 0.40     Delivery systems VENTILATOR     Mode PRESSURE REGULATED VOLUME CONTROL     VT 500     Rate 14     Peep/cpap 5.0     pH, Arterial 7.373  7.350 - 7.450   pCO2 arterial 43.6  35.0 - 45.0 mmHg   pO2, Arterial 135.0 (*) 80.0 - 100.0 mmHg   Bicarbonate 24.4 (*) 20.0 - 24.0 mEq/L   TCO2 25.7  0 - 100 mmol/L   Acid-Base Excess 0.1  0.0 - 2.0 mmol/L   O2 Saturation 98.8     Patient temperature 100.6     Collection site ARTERIAL LINE     Drawn by [phone]     Sample type ARTERIAL DRAW       Assessment/Plan:   NEURO  Altered Mental Status:  coma and  posturing Trauma-CNS:  intracranial injury   Plan: CPM, add sedation.  PULM  No focal infiltrates.   Plan: CPM  CARDIO  Ventricular Tachycardia (non-sustained) and Sinus Tachycardia   Plan: No specific treatment outside of giving more potassium  RENAL  Oliguria (probably hypovolemia and mild)   Plan: BOlus with saline and possibly albumin  GI  No specific issues, start tube feedings.   Plan: Start trickle tube feedings  ID  No known infectious sources   Plan: CPM  HEME  Anemia acute blood loss anemia)   Plan: No blood for now, may need some for ORIF tomorrow of left leg  ENDO No known issues   Plan: CPM  Global Issues  Bad TBI.  Getting portable CT head  today, start trickle tube feedings today, to go to the OR tomorrow for left leg.  Bolus normal saline.    LOS: 1 day   Additional comments:I reviewed the patient's new clinical lab test results. cbc/bmet and I reviewed the patients new imaging test results. cxr  Critical Care Total Time*: 30 Minutes  Victor [name] 01/01/2014  *Care during the described time interval was provided by me and/or other providers on the critical care team.  I have reviewed this patient's available data, including medical history, events of note, physical examination and test results as part of my evaluation.

## 2014-01-01 NOTE — Anesthesia Postprocedure Evaluation (Signed)
  Anesthesia Post-op Note  Patient: Victor [name]  Procedure(s) Performed: Procedure(s): CRANIECTOMY HEMATOMA EVACUATION SUBDURAL, BONE FLAP PLACED IN ABDOMEN (N/A)  Patient Location: PACU and NICU  Anesthesia Type:General  Level of Consciousness: Patient remains intubated per anesthesia plan  Airway and Oxygen Therapy: Patient remains intubated per anesthesia plan  Post-op Pain: mild  Post-op Assessment: Post-op Vital signs reviewed  Post-op Vital Signs: Reviewed  Last Vitals:  Filed Vitals:   01/01/14 1400  BP: 114/60  Pulse: 93  Temp: 38 C  Resp: 15    Complications: No apparent anesthesia complications
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
## 2014-01-02 ENCOUNTER — Inpatient Hospital Stay (HOSPITAL_COMMUNITY): Payer: Medicaid Other

## 2014-01-02 ENCOUNTER — Encounter (HOSPITAL_COMMUNITY): Payer: Medicaid Other | Admitting: Anesthesiology

## 2014-01-02 ENCOUNTER — Encounter (HOSPITAL_COMMUNITY): Admission: EM | Disposition: A | Discharge status: Skilled Nursing Facility | Payer: Self-pay | Source: Home / Self Care

## 2014-01-02 ENCOUNTER — Inpatient Hospital Stay (HOSPITAL_COMMUNITY): Payer: Medicaid Other | Admitting: Anesthesiology

## 2014-01-02 ENCOUNTER — Encounter (HOSPITAL_COMMUNITY): Payer: Self-pay | Admitting: Anesthesiology

## 2014-01-02 DIAGNOSIS — D62 Acute posthemorrhagic anemia: Secondary | ICD-10-CM

## 2014-01-02 DIAGNOSIS — S27329A Contusion of lung, unspecified, initial encounter: Secondary | ICD-10-CM

## 2014-01-02 DIAGNOSIS — E46 Unspecified protein-calorie malnutrition: Secondary | ICD-10-CM

## 2014-01-02 DIAGNOSIS — J95821 Acute postprocedural respiratory failure: Secondary | ICD-10-CM

## 2014-01-02 DIAGNOSIS — S065XAA Traumatic subdural hemorrhage with loss of consciousness status unknown, initial encounter: Secondary | ICD-10-CM

## 2014-01-02 DIAGNOSIS — S065X9A Traumatic subdural hemorrhage with loss of consciousness of unspecified duration, initial encounter: Secondary | ICD-10-CM

## 2014-01-02 HISTORY — PX: TIBIA IM NAIL INSERTION: SHX2516

## 2014-01-02 LAB — CBC WITH DIFFERENTIAL/PLATELET
BASOS ABS: 0 10*3/uL (ref 0.0–0.1)
Basophils Relative: 0 % (ref 0–1)
EOS ABS: 0.5 10*3/uL (ref 0.0–0.7)
Eosinophils Relative: 4 % (ref 0–5)
HCT: 26.9 % — ABNORMAL LOW (ref 39.0–52.0)
Hemoglobin: 8.3 g/dL — ABNORMAL LOW (ref 13.0–17.0)
LYMPHS ABS: 1.1 10*3/uL (ref 0.7–4.0)
Lymphocytes Relative: 9 % — ABNORMAL LOW (ref 12–46)
MCH: 23.1 pg — ABNORMAL LOW (ref 26.0–34.0)
MCHC: 30.9 g/dL (ref 30.0–36.0)
MCV: 74.9 fL — AB (ref 78.0–100.0)
MONOS PCT: 7 % (ref 3–12)
Monocytes Absolute: 0.8 10*3/uL (ref 0.1–1.0)
NEUTROS ABS: 9.4 10*3/uL — AB (ref 1.7–7.7)
Neutrophils Relative %: 80 % — ABNORMAL HIGH (ref 43–77)
Platelets: 123 10*3/uL — ABNORMAL LOW (ref 150–400)
RBC: 3.59 MIL/uL — ABNORMAL LOW (ref 4.22–5.81)
RDW: 16.3 % — AB (ref 11.5–15.5)
WBC: 11.8 10*3/uL — ABNORMAL HIGH (ref 4.0–10.5)

## 2014-01-02 LAB — BASIC METABOLIC PANEL
BUN: 9 mg/dL (ref 6–23)
CALCIUM: 8.2 mg/dL — AB (ref 8.4–10.5)
CO2: 24 mEq/L (ref 19–32)
Chloride: 109 mEq/L (ref 96–112)
Creatinine, Ser: 0.98 mg/dL (ref 0.50–1.35)
GFR calc Af Amer: >90 mL/min (ref >90)
Glucose, Bld: 97 mg/dL (ref 70–99)
Potassium: 4.2 mEq/L (ref 3.7–5.3)
SODIUM: 145 meq/L (ref 137–147)

## 2014-01-02 LAB — TRIGLYCERIDES: Triglycerides: 109 mg/dL (ref <150)

## 2014-01-02 LAB — GLUCOSE, CAPILLARY
GLUCOSE-CAPILLARY: 97 mg/dL (ref 70–99)
Glucose-Capillary: 80 mg/dL (ref 70–99)

## 2014-01-02 SURGERY — INSERTION, INTRAMEDULLARY ROD, TIBIA
Anesthesia: General | Site: Leg Lower | Laterality: Left

## 2014-01-02 MED ORDER — SODIUM CHLORIDE 0.9 % IV SOLN
2500.0000 ug | INTRAVENOUS | Status: DC | PRN
  Administered 2014-01-02: 35 ug/h via INTRAVENOUS

## 2014-01-02 MED ORDER — HYDROMORPHONE HCL PF 1 MG/ML IJ SOLN: 0.2500 mg | INTRAMUSCULAR | Status: DC | PRN

## 2014-01-02 MED ORDER — FENTANYL CITRATE 0.05 MG/ML IJ SOLN
INTRAMUSCULAR | Status: DC | PRN
  Administered 2014-01-02 (×4): 50 ug via INTRAVENOUS
  Administered 2014-01-02: 100 ug via INTRAVENOUS
  Administered 2014-01-02: 50 ug via INTRAVENOUS

## 2014-01-02 MED ORDER — CEFAZOLIN SODIUM-DEXTROSE 2-3 GM-% IV SOLR
INTRAVENOUS | Status: DC | PRN
  Administered 2014-01-02: 2 g via INTRAVENOUS

## 2014-01-02 MED ORDER — ESMOLOL HCL 10 MG/ML IV SOLN
INTRAVENOUS | Status: DC | PRN
  Administered 2014-01-02: 20 mg via INTRAVENOUS

## 2014-01-02 MED ORDER — 0.9 % SODIUM CHLORIDE (POUR BTL) OPTIME
TOPICAL | Status: DC | PRN
  Administered 2014-01-02: 1000 mL

## 2014-01-02 MED ORDER — LACTATED RINGERS IV SOLN
INTRAVENOUS | Status: DC | PRN
  Administered 2014-01-02: 08:00:00 via INTRAVENOUS

## 2014-01-02 MED ORDER — PROMETHAZINE HCL 25 MG/ML IJ SOLN: 6.2500 mg | INTRAMUSCULAR | Status: DC | PRN

## 2014-01-02 MED ORDER — PROPOFOL INFUSION 10 MG/ML OPTIME
INTRAVENOUS | Status: DC | PRN
  Administered 2014-01-02: 10 ug/kg/min via INTRAVENOUS

## 2014-01-02 MED ORDER — ROCURONIUM BROMIDE 100 MG/10ML IV SOLN
INTRAVENOUS | Status: DC | PRN
  Administered 2014-01-02: 30 mg via INTRAVENOUS
  Administered 2014-01-02: 50 mg via INTRAVENOUS
  Administered 2014-01-02: 20 mg via INTRAVENOUS

## 2014-01-02 SURGICAL SUPPLY — 55 items
BANDAGE ELASTIC 4 VELCRO ST LF (GAUZE/BANDAGES/DRESSINGS) ×3 IMPLANT
BANDAGE ELASTIC 6 VELCRO ST LF (GAUZE/BANDAGES/DRESSINGS) ×3 IMPLANT
BANDAGE GAUZE ELAST BULKY 4 IN (GAUZE/BANDAGES/DRESSINGS) ×3 IMPLANT
BLADE SURG 10 STRL SS (BLADE) ×2 IMPLANT
BRUSH SCRUB DISP (MISCELLANEOUS) ×3 IMPLANT
CLOSURE WOUND 1/2 X4 (GAUZE/BANDAGES/DRESSINGS) ×1
COVER SURGICAL LIGHT HANDLE (MISCELLANEOUS) ×4 IMPLANT
DRAPE C-ARM 42X72 X-RAY (DRAPES) ×3 IMPLANT
DRAPE C-ARMOR (DRAPES) ×3 IMPLANT
DRAPE EXTREMITY T 121X128X90 (DRAPE) ×4 IMPLANT
DRAPE INCISE IOBAN 66X45 STRL (DRAPES) IMPLANT
DRAPE PROXIMA HALF (DRAPES) ×6 IMPLANT
DRAPE U-SHAPE 47X51 STRL (DRAPES) ×3 IMPLANT
DRSG ADAPTIC 3X8 NADH LF (GAUZE/BANDAGES/DRESSINGS) ×3 IMPLANT
ELECT REM PT RETURN 9FT ADLT (ELECTROSURGICAL) ×3
ELECTRODE REM PT RTRN 9FT ADLT (ELECTROSURGICAL) ×1 IMPLANT
EVACUATOR 1/8 PVC DRAIN (DRAIN) IMPLANT
GLOVE BIO SURGEON STRL SZ7.5 (GLOVE) ×3 IMPLANT
GLOVE BIO SURGEON STRL SZ8 (GLOVE) ×3 IMPLANT
GLOVE BIO SURGEON STRL SZ8.5 (GLOVE) ×3 IMPLANT
GLOVE BIOGEL PI IND STRL 7.5 (GLOVE) ×1 IMPLANT
GLOVE BIOGEL PI IND STRL 8 (GLOVE) ×1 IMPLANT
GLOVE BIOGEL PI INDICATOR 7.5 (GLOVE) ×2
GLOVE BIOGEL PI INDICATOR 8 (GLOVE) ×2
GOWN STRL REUS W/ TWL LRG LVL3 (GOWN DISPOSABLE) ×2 IMPLANT
GOWN STRL REUS W/ TWL XL LVL3 (GOWN DISPOSABLE) ×1 IMPLANT
GOWN STRL REUS W/TWL LRG LVL3 (GOWN DISPOSABLE) ×6
GOWN STRL REUS W/TWL XL LVL3 (GOWN DISPOSABLE) ×3
GUIDEWIRE BALL NOSE 80CM (WIRE) ×2 IMPLANT
KIT BASIN OR (CUSTOM PROCEDURE TRAY) ×3 IMPLANT
KIT ROOM TURNOVER OR (KITS) ×3 IMPLANT
NAIL TIBIAL 9MMX37.5CM (Nail) ×2 IMPLANT
PACK GENERAL/GYN (CUSTOM PROCEDURE TRAY) ×3 IMPLANT
PAD ABD 8X10 STRL (GAUZE/BANDAGES/DRESSINGS) ×3 IMPLANT
PAD ARMBOARD 7.5X6 YLW CONV (MISCELLANEOUS) ×4 IMPLANT
SCREW ACECAP 36MM (Screw) ×2 IMPLANT
SCREW ACECAP 42MM (Screw) ×4 IMPLANT
SCREW ACECAP 46MM (Screw) ×3 IMPLANT
SCREW PROXIMAL 4.5MMX18MM (Screw) ×2 IMPLANT
SCREW PROXIMAL DEPUY (Screw) ×6 IMPLANT
SCREW PRXML FT 55X5.5XNS TIB (Screw) ×1 IMPLANT
SCREW PRXML FT 65X5.5XNS CORT (Screw) IMPLANT
SPONGE GAUZE 4X4 12PLY (GAUZE/BANDAGES/DRESSINGS) ×3 IMPLANT
STAPLER VISISTAT 35W (STAPLE) ×3 IMPLANT
STRIP CLOSURE SKIN 1/2X4 (GAUZE/BANDAGES/DRESSINGS) ×2 IMPLANT
SUT ETHILON 3 0 PS 1 (SUTURE) ×4 IMPLANT
SUT VIC AB 0 CT1 27 (SUTURE) ×3
SUT VIC AB 0 CT1 27XBRD ANBCTR (SUTURE) IMPLANT
SUT VIC AB 2-0 CT1 27 (SUTURE) ×3
SUT VIC AB 2-0 CT1 TAPERPNT 27 (SUTURE) IMPLANT
SUT VIC AB 2-0 CT3 27 (SUTURE) IMPLANT
TOWEL OR 17X24 6PK STRL BLUE (TOWEL DISPOSABLE) ×3 IMPLANT
TOWEL OR 17X26 10 PK STRL BLUE (TOWEL DISPOSABLE) ×6 IMPLANT
TRAY FOLEY CATH 16FRSI W/METER (SET/KITS/TRAYS/PACK) ×1 IMPLANT
YANKAUER SUCT BULB TIP NO VENT (SUCTIONS) IMPLANT
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
## 2014-01-02 NOTE — Transfer of Care (Signed)
Immediate Anesthesia Transfer of Care Note  Patient: Victor [name]  Procedure(s) Performed: Procedure(s): INTRAMEDULLARY (IM) NAIL TIBIAL (Left)  Patient Location: PACU and ICU  Anesthesia Type:General  Level of Consciousness: unresponsive and Patient remains intubated per anesthesia plan  Airway & Oxygen Therapy: Patient remains intubated per anesthesia plan and Patient placed on Ventilator (see vital sign flow sheet for setting)  Post-op Assessment: Report given to PACU RN and Post -op Vital signs reviewed and stable  Post vital signs: Reviewed and stable  Complications: No apparent anesthesia complications
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
## 2014-01-02 NOTE — Progress Notes (Signed)
Orthopedic Tech Progress Note Patient Details:  Victor [name] 03-Jul-1991 [id number] Prafo Boot delivered to OR desk as ordered Ortho Devices Type of Ortho Device: Other (comment) Ortho Device/Splint Interventions: Ordered   [place] R Thompson 01/02/2014, 10:25 AM

## 2014-01-02 NOTE — Progress Notes (Signed)
Around 0300 nurse not able to get any movements on bilateral upper extremity to painful stimuli. Patient had posturing on all extremities before. Pupils are sluggish, unchanged from before. Vitals remained stable. MD paged. About half and hour later able to see some posturing on upper extrimities. MD made aware. No new orders at this time. Will continue to monitor.

## 2014-01-02 NOTE — Brief Op Note (Signed)
12/31/2013 - 01/02/2014  10:55 AM  PATIENT:  Victor [name]  23 y.o. male  PRE-OPERATIVE DIAGNOSIS:  Left Tibia Fracture  POST-OPERATIVE DIAGNOSIS:  Left Tibia Fracture  PROCEDURE:  Procedure(s): INTRAMEDULLARY (IM) NAIL TIBIAL (Left) with Biomet Versanail 9 x 375 mm statically locked nail and blocking screw  SURGEON:  Surgeon(s) and Role:    * Budd Palmer, MD - Primary  PHYSICIAN ASSISTANT: Montez Morita, PA-C  ANESTHESIA:   general  I/O:  Total I/O In: 600 [I.V.:600] Out: 255 [Urine:225; Blood:30]  SPECIMEN:  No Specimen  TOURNIQUET:  * No tourniquets in log *  DICTATION: .Other Dictation: Dictation Number [id number]

## 2014-01-02 NOTE — Progress Notes (Signed)
I have seen and examined the patient. I agree with the findings above.  Budd Palmer, MD 01/01/2014 9:34 AM

## 2014-01-02 NOTE — Progress Notes (Signed)
Report given to CRNA, patient escorted to surgery by OR team.

## 2014-01-02 NOTE — Consult Note (Signed)
I have seen and examined the patient. I agree with the findings above.  Left tibial shaft fracture with apex anterior and valgus. Head injury, extensor posturing. Good perfusion distally, compartments soft  Plan for IM nailing of the left tibia.  Have spoken twice with trauma service who recommends [place] proceeding with bone repair at this time, and they have discussed directly with Dr. Jeral Fruit.  Budd Palmer, MD 01/02/2014 10:52 AM

## 2014-01-02 NOTE — Anesthesia Preprocedure Evaluation (Addendum)
Anesthesia Evaluation  Patient identified by MRN, date of birth, ID band Patient awake    Reviewed: Allergy & Precautions, H&P , NPO status , Patient's Chart, lab work & pertinent test results  Airway Mallampati: II TM Distance: >3 FB Neck ROM: Limited    Dental no notable dental hx.    Pulmonary Current Smoker,  breath sounds clear to auscultation  Pulmonary exam normal       Cardiovascular hypertension, Rhythm:Regular Rate:Normal     Neuro/Psych negative neurological ROS  negative psych ROS   GI/Hepatic negative GI ROS, Neg liver ROS,   Endo/Other  diabetes  Renal/GU negative Renal ROS  negative genitourinary   Musculoskeletal negative musculoskeletal ROS (+)   Abdominal   Peds negative pediatric ROS (+)  Hematology  (+) anemia ,   Anesthesia Other Findings   Reproductive/Obstetrics negative OB ROS                          Anesthesia Physical Anesthesia Plan  ASA: III  Anesthesia Plan: General   Post-op Pain Management:    Induction: Intravenous  Airway Management Planned: Oral ETT  Additional Equipment:   Intra-op Plan:   Post-operative Plan: Possible Post-op intubation/ventilation  Informed Consent: I have reviewed the patients History and Physical, chart, labs and discussed the procedure including the risks, benefits and alternatives for the proposed anesthesia with the patient or authorized representative who has indicated his/her understanding and acceptance.   Dental advisory given  Plan Discussed with: CRNA and Surgeon  Anesthesia Plan Comments: (Patient currently intubated will return to ICU intubated)       Anesthesia Quick Evaluation

## 2014-01-02 NOTE — Progress Notes (Signed)
I attempted to speak with family postoperatively but not present in hospital in surgical waiting nor neuro ICU waiting.  Called numbers for both Victor [name], uncle and first contact at [phone], and also mother, Victor [name], at [phone].  My intent was to let them know that surgery went very well and that there were no complications.    Myrene GalasMichael Johnica Armwood, MD [facility] [phone] (p)

## 2014-01-03 ENCOUNTER — Inpatient Hospital Stay (HOSPITAL_COMMUNITY): Payer: Medicaid Other

## 2014-01-03 DIAGNOSIS — J9600 Acute respiratory failure, unspecified whether with hypoxia or hypercapnia: Secondary | ICD-10-CM | POA: Diagnosis present

## 2014-01-03 DIAGNOSIS — I6200 Nontraumatic subdural hemorrhage, unspecified: Secondary | ICD-10-CM

## 2014-01-03 LAB — BLOOD GAS, ARTERIAL
Acid-Base Excess: 2.5 mmol/L — ABNORMAL HIGH (ref 0.0–2.0)
Acid-Base Excess: 3 mmol/L — ABNORMAL HIGH (ref 0.0–2.0)
Bicarbonate: 27.2 mEq/L — ABNORMAL HIGH (ref 20.0–24.0)
Bicarbonate: 27.4 mEq/L — ABNORMAL HIGH (ref 20.0–24.0)
Drawn by: 41308
FIO2: 40 %
FIO2: 0.4 %
LHR: 14 {breaths}/min
LHR: 14 {breaths}/min
MECHVT: 500 mL
O2 SAT: 100 %
O2 Saturation: 100 %
PCO2 ART: 44.9 mmHg (ref 35.0–45.0)
PEEP/CPAP: 5 cmH2O
PEEP: 5 cmH2O
PH ART: 7.382 (ref 7.350–7.450)
PO2 ART: 164 mmHg — AB (ref 80.0–100.0)
Patient temperature: 98.6
Patient temperature: 98.6
TCO2: 28.6 mmol/L (ref 0–100)
TCO2: 28.8 mmol/L (ref 0–100)
VT: 550 mL
pCO2 arterial: 46.8 mmHg — ABNORMAL HIGH (ref 35.0–45.0)
pH, Arterial: 7.403 (ref 7.350–7.450)
pO2, Arterial: 156 mmHg — ABNORMAL HIGH (ref 80.0–100.0)

## 2014-01-03 LAB — CBC
HCT: 24.5 % — ABNORMAL LOW (ref 39.0–52.0)
HEMOGLOBIN: 7.7 g/dL — AB (ref 13.0–17.0)
MCH: 23.2 pg — AB (ref 26.0–34.0)
MCHC: 31.4 g/dL (ref 30.0–36.0)
MCV: 73.8 fL — AB (ref 78.0–100.0)
Platelets: 156 10*3/uL (ref 150–400)
RBC: 3.32 MIL/uL — ABNORMAL LOW (ref 4.22–5.81)
RDW: 16.1 % — ABNORMAL HIGH (ref 11.5–15.5)
WBC: 10.3 10*3/uL (ref 4.0–10.5)

## 2014-01-03 LAB — BASIC METABOLIC PANEL
BUN: 7 mg/dL (ref 6–23)
CALCIUM: 8.1 mg/dL — AB (ref 8.4–10.5)
CO2: 26 meq/L (ref 19–32)
CREATININE: 0.76 mg/dL (ref 0.50–1.35)
Chloride: 109 mEq/L (ref 96–112)
GFR calc Af Amer: >90 mL/min (ref >90)
GFR calc non Af Amer: >90 mL/min (ref >90)
GLUCOSE: 89 mg/dL (ref 70–99)
Potassium: 4.1 mEq/L (ref 3.7–5.3)
SODIUM: 144 meq/L (ref 137–147)

## 2014-01-03 LAB — GLUCOSE, CAPILLARY
GLUCOSE-CAPILLARY: 97 mg/dL (ref 70–99)
GLUCOSE-CAPILLARY: 97 mg/dL (ref 70–99)
Glucose-Capillary: 99 mg/dL (ref 70–99)
Glucose-Capillary: 99 mg/dL (ref 70–99)
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
## 2014-01-03 NOTE — Progress Notes (Signed)
UR completed.  Chiann Goffredo, RN BSN MHA CCM Trauma/Neuro ICU Case Manager [phone]  
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
## 2014-01-04 LAB — GLUCOSE, CAPILLARY
GLUCOSE-CAPILLARY: 107 mg/dL — AB (ref 70–99)
Glucose-Capillary: 106 mg/dL — ABNORMAL HIGH (ref 70–99)
Glucose-Capillary: 108 mg/dL — ABNORMAL HIGH (ref 70–99)

## 2014-01-04 LAB — CBC WITH DIFFERENTIAL/PLATELET
BASOS ABS: 0 10*3/uL (ref 0.0–0.1)
Basophils Relative: 0 % (ref 0–1)
EOS ABS: 0.6 10*3/uL (ref 0.0–0.7)
Eosinophils Relative: 7 % — ABNORMAL HIGH (ref 0–5)
HEMATOCRIT: 23.6 % — AB (ref 39.0–52.0)
Hemoglobin: 7.3 g/dL — ABNORMAL LOW (ref 13.0–17.0)
Lymphocytes Relative: 16 % (ref 12–46)
Lymphs Abs: 1.4 10*3/uL (ref 0.7–4.0)
MCH: 22.8 pg — ABNORMAL LOW (ref 26.0–34.0)
MCHC: 30.9 g/dL (ref 30.0–36.0)
MCV: 73.8 fL — ABNORMAL LOW (ref 78.0–100.0)
Monocytes Absolute: 0.7 10*3/uL (ref 0.1–1.0)
Monocytes Relative: 8 % (ref 3–12)
NEUTROS ABS: 5.9 10*3/uL (ref 1.7–7.7)
Neutrophils Relative %: 69 % (ref 43–77)
Platelets: 183 10*3/uL (ref 150–400)
RBC: 3.2 MIL/uL — ABNORMAL LOW (ref 4.22–5.81)
RDW: 16 % — ABNORMAL HIGH (ref 11.5–15.5)
WBC: 8.6 10*3/uL (ref 4.0–10.5)

## 2014-01-04 LAB — BASIC METABOLIC PANEL
BUN: 7 mg/dL (ref 6–23)
CO2: 28 mEq/L (ref 19–32)
CREATININE: 0.72 mg/dL (ref 0.50–1.35)
Calcium: 8.3 mg/dL — ABNORMAL LOW (ref 8.4–10.5)
Chloride: 108 mEq/L (ref 96–112)
GFR calc Af Amer: >90 mL/min (ref >90)
Glucose, Bld: 110 mg/dL — ABNORMAL HIGH (ref 70–99)
POTASSIUM: 3.8 meq/L (ref 3.7–5.3)
Sodium: 146 mEq/L (ref 137–147)

## 2014-01-04 MED ORDER — HYDRALAZINE HCL 20 MG/ML IJ SOLN: 10.0000 mg | Freq: Four times a day (QID) | INTRAMUSCULAR | Status: DC | PRN

## 2014-01-04 MED ORDER — METOPROLOL TARTRATE 25 MG/10 ML ORAL SUSPENSION
25.0000 mg | Freq: Two times a day (BID) | ORAL | Status: DC
  Administered 2014-01-04 – 2014-01-13 (×18): 25 mg
  Filled 2014-01-04 (×20): qty 10

## 2014-01-04 NOTE — Progress Notes (Signed)
Patient ID: Victor [name], male   DOB: June 16, 1991, 23 y.o.   MRN: [id number] Afeb, vss. No new neuro issues. Continues with posturing. Drains removed today. Continue present rx.
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
## 2014-01-04 NOTE — Anesthesia Postprocedure Evaluation (Signed)
  Anesthesia Post-op Note  Patient: Victor [name]  Procedure(s) Performed: Procedure(s) (LRB): INTRAMEDULLARY (IM) NAIL TIBIAL (Left)  Patient Location: PACU  Anesthesia Type: General  Level of Consciousness:   Airway and Oxygen Therapy:   Post-op Pain: mild  Post-op Assessment: Post-op Vital signs reviewed, Patient's Cardiovascular Status Stable, Respiratory Function Stable, taken to ICU intubated and sedated  Last Vitals:  Filed Vitals:   01/04/14 0700  BP: 135/71  Pulse: 78  Temp: 38 C  Resp: 14    Post-op Vital Signs: stable   Complications: No apparent anesthesia complications

## 2014-01-04 NOTE — Progress Notes (Signed)
     Subjective:  Patient intubated, sedated, GCS of 5 max per nursing.    Objective:   VITALS:   Filed Vitals:   01/04/14 0743 01/04/14 0800 01/04/14 0830 01/04/14 0900  BP: 136/72 139/79 149/83 142/82  Pulse: 81 81    Temp:   100 F (37.8 C) 100 F (37.8 C)  TempSrc:      Resp: 14 14 14 14   Height:      Weight:      SpO2: 100% 100%      Left leg brace intact.  Compartments soft. Foot in neutral dorsiflexion.    Lab Results  Component Value Date   WBC 8.6 01/04/2014   HGB 7.3* 01/04/2014   HCT 23.6* 01/04/2014   MCV 73.8* 01/04/2014   PLT 183 01/04/2014     Assessment/Plan: 2 Days Post-Op   Active Problems:   SDH (subdural hematoma)   Acute subdural hematoma   Acute respiratory failure   Left tibia fracture, s/p IM nail.  Mgmt per CCM.  Peg trach planned, and future jaw surgery.  NWB LLE, Handy to return on Monday.  Please call with questions.   Lysette Lindenbaum P 01/04/2014, 9:23 AM  Cell [phone]

## 2014-01-05 LAB — CBC
HCT: 24.9 % — ABNORMAL LOW (ref 39.0–52.0)
HEMOGLOBIN: 7.5 g/dL — AB (ref 13.0–17.0)
MCH: 22.3 pg — ABNORMAL LOW (ref 26.0–34.0)
MCHC: 30.1 g/dL (ref 30.0–36.0)
MCV: 73.9 fL — ABNORMAL LOW (ref 78.0–100.0)
Platelets: 217 10*3/uL (ref 150–400)
RBC: 3.37 MIL/uL — AB (ref 4.22–5.81)
RDW: 16.3 % — ABNORMAL HIGH (ref 11.5–15.5)
WBC: 10.6 10*3/uL — ABNORMAL HIGH (ref 4.0–10.5)

## 2014-01-05 LAB — PHOSPHORUS: PHOSPHORUS: 3.1 mg/dL (ref 2.3–4.6)

## 2014-01-05 LAB — BASIC METABOLIC PANEL
BUN: 9 mg/dL (ref 6–23)
CHLORIDE: 106 meq/L (ref 96–112)
CO2: 27 meq/L (ref 19–32)
Calcium: 8.6 mg/dL (ref 8.4–10.5)
Creatinine, Ser: 0.66 mg/dL (ref 0.50–1.35)
GFR calc Af Amer: >90 mL/min (ref >90)
GFR calc non Af Amer: >90 mL/min (ref >90)
Glucose, Bld: 108 mg/dL — ABNORMAL HIGH (ref 70–99)
Potassium: 4 mEq/L (ref 3.7–5.3)
SODIUM: 145 meq/L (ref 137–147)

## 2014-01-05 LAB — GLUCOSE, CAPILLARY
Glucose-Capillary: 109 mg/dL — ABNORMAL HIGH (ref 70–99)
Glucose-Capillary: 102 mg/dL — ABNORMAL HIGH (ref 70–99)
Glucose-Capillary: 107 mg/dL — ABNORMAL HIGH (ref 70–99)
Glucose-Capillary: 96 mg/dL (ref 70–99)

## 2014-01-05 LAB — MAGNESIUM: Magnesium: 1.8 mg/dL (ref 1.5–2.5)

## 2014-01-05 MED ORDER — PIVOT 1.5 CAL PO LIQD
1000.0000 mL | ORAL | Status: DC
  Administered 2014-01-05: 1000 mL
  Filled 2014-01-05 (×2): qty 1000
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
## 2014-01-05 NOTE — Progress Notes (Signed)
No new issues or events overnight.  Patient with slight low-grade fever. Otherwise vitals stable. Urine output good.  On exam patient will open his eyes to stimulus area and he does not appear aware at present. Pupils are equal and 4 mm bilaterally. They react symmetrically. Cough and gag reflex intact. Patient strongly purposeful with right upper and lower extremity. Left upper extremity extends to pain. Wounds clean and dry.  Making significant improvement. Continue supportive care. No new recommendations.
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
## 2014-01-06 ENCOUNTER — Inpatient Hospital Stay (HOSPITAL_COMMUNITY): Payer: Medicaid Other

## 2014-01-06 ENCOUNTER — Encounter (HOSPITAL_COMMUNITY): Payer: Self-pay | Admitting: Orthopedic Surgery

## 2014-01-06 DIAGNOSIS — IMO0002 Reserved for concepts with insufficient information to code with codable children: Secondary | ICD-10-CM | POA: Diagnosis present

## 2014-01-06 LAB — CBC
HEMATOCRIT: 24.3 % — AB (ref 39.0–52.0)
Hemoglobin: 7.5 g/dL — ABNORMAL LOW (ref 13.0–17.0)
MCH: 22.7 pg — ABNORMAL LOW (ref 26.0–34.0)
MCHC: 30.9 g/dL (ref 30.0–36.0)
MCV: 73.4 fL — AB (ref 78.0–100.0)
Platelets: 287 10*3/uL (ref 150–400)
RBC: 3.31 MIL/uL — ABNORMAL LOW (ref 4.22–5.81)
RDW: 16.4 % — ABNORMAL HIGH (ref 11.5–15.5)
WBC: 11.9 10*3/uL — ABNORMAL HIGH (ref 4.0–10.5)

## 2014-01-06 LAB — BASIC METABOLIC PANEL
BUN: 11 mg/dL (ref 6–23)
CALCIUM: 9.3 mg/dL (ref 8.4–10.5)
CHLORIDE: 105 meq/L (ref 96–112)
CO2: 29 meq/L (ref 19–32)
CREATININE: 0.68 mg/dL (ref 0.50–1.35)
GFR calc Af Amer: >90 mL/min (ref >90)
GFR calc non Af Amer: >90 mL/min (ref >90)
GLUCOSE: 117 mg/dL — AB (ref 70–99)
Potassium: 5.2 mEq/L (ref 3.7–5.3)
Sodium: 145 mEq/L (ref 137–147)

## 2014-01-06 MED ORDER — FREE WATER
200.0000 mL | Freq: Three times a day (TID) | Status: DC
  Administered 2014-01-06 – 2014-01-26 (×56): 200 mL

## 2014-01-06 MED ORDER — PIVOT 1.5 CAL PO LIQD
1000.0000 mL | ORAL | Status: DC
  Administered 2014-01-08 – 2014-01-09 (×3): 1000 mL
  Filled 2014-01-06 (×5): qty 1000

## 2014-01-06 MED ORDER — CEFAZOLIN SODIUM-DEXTROSE 2-3 GM-% IV SOLR
2.0000 g | INTRAVENOUS | Status: AC
  Administered 2014-01-07: 2 g via INTRAVENOUS
  Filled 2014-01-06: qty 50

## 2014-01-06 MED ORDER — PRO-STAT SUGAR FREE PO LIQD
30.0000 mL | Freq: Three times a day (TID) | ORAL | Status: DC
  Administered 2014-01-06 – 2014-01-08 (×4): 30 mL
  Administered 2014-01-08: 22:00:00
  Administered 2014-01-09 – 2014-01-10 (×4): 30 mL
  Filled 2014-01-06 (×13): qty 30

## 2014-01-06 MED ORDER — SODIUM CHLORIDE 0.9 % IV SOLN
INTRAVENOUS | Status: DC
  Administered 2014-01-06 – 2014-01-10 (×2): via INTRAVENOUS
  Filled 2014-01-06: qty 1000
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
## 2014-01-06 NOTE — Progress Notes (Signed)
Patient ID: Victor [name], male   DOB: 11-24-1990, 23 y.o.   MRN: [id number] I met with his uncle and updated him on the plan of care. I also discussed tracheostomy and EGD/PEG placement including the risks and benefits. He is agreeable and consent was obtained. Will hold TF at MN.  Georganna Skeans, MD, MPH, FACS Trauma: [phone] General Surgery: [phone]  01/06/2014 11:07 AM

## 2014-01-06 NOTE — Progress Notes (Signed)
Patient ID: Victor EvangelistDave XXXShaw, male   DOB: 1991/04/26, 23 y.o.   MRN: [id number] Afeb, vss No new neuro issues. Exam unchanged vs this past weekend.  Will recheck CT tomorrow am.

## 2014-01-07 ENCOUNTER — Encounter (HOSPITAL_COMMUNITY): Admission: EM | Disposition: A | Discharge status: Skilled Nursing Facility | Payer: Self-pay | Source: Home / Self Care

## 2014-01-07 ENCOUNTER — Inpatient Hospital Stay (HOSPITAL_COMMUNITY): Payer: Medicaid Other

## 2014-01-07 DIAGNOSIS — R633 Feeding difficulties, unspecified: Secondary | ICD-10-CM

## 2014-01-07 HISTORY — PX: PEG PLACEMENT: SHX5437

## 2014-01-07 HISTORY — PX: PERCUTANEOUS TRACHEOSTOMY: SHX5288

## 2014-01-07 LAB — CBC
HCT: 25.7 % — ABNORMAL LOW (ref 39.0–52.0)
Hemoglobin: 8 g/dL — ABNORMAL LOW (ref 13.0–17.0)
MCH: 22.7 pg — AB (ref 26.0–34.0)
MCHC: 31.1 g/dL (ref 30.0–36.0)
MCV: 73 fL — ABNORMAL LOW (ref 78.0–100.0)
PLATELETS: 358 10*3/uL (ref 150–400)
RBC: 3.52 MIL/uL — AB (ref 4.22–5.81)
RDW: 16.3 % — ABNORMAL HIGH (ref 11.5–15.5)
WBC: 15.5 10*3/uL — ABNORMAL HIGH (ref 4.0–10.5)

## 2014-01-07 LAB — BASIC METABOLIC PANEL
BUN: 13 mg/dL (ref 6–23)
CALCIUM: 9.2 mg/dL (ref 8.4–10.5)
CO2: 29 mEq/L (ref 19–32)
CREATININE: 0.61 mg/dL (ref 0.50–1.35)
Chloride: 100 mEq/L (ref 96–112)
GFR calc Af Amer: >90 mL/min (ref >90)
GLUCOSE: 102 mg/dL — AB (ref 70–99)
Potassium: 4.3 mEq/L (ref 3.7–5.3)
Sodium: 141 mEq/L (ref 137–147)

## 2014-01-07 SURGERY — CREATION, TRACHEOSTOMY, PERCUTANEOUS
Anesthesia: Choice | Site: Neck

## 2014-01-07 SURGERY — INSERTION, PEG TUBE
Anesthesia: Moderate Sedation | Laterality: Bilateral

## 2014-01-07 MED ORDER — MIDAZOLAM HCL 2 MG/2ML IJ SOLN
INTRAMUSCULAR | Status: AC
  Administered 2014-01-07: 8 mg via INTRAVENOUS
  Filled 2014-01-07: qty 6

## 2014-01-07 MED ORDER — MIDAZOLAM HCL 2 MG/2ML IJ SOLN
8.0000 mg | Freq: Once | INTRAMUSCULAR | Status: AC
  Administered 2014-01-07: 8 mg via INTRAVENOUS

## 2014-01-07 MED ORDER — LIDOCAINE-EPINEPHRINE 1.5 %-1:200,000 OPTIME - NO CHARGE
INTRAMUSCULAR | Status: DC | PRN
  Administered 2014-01-07: 3 mL via SUBCUTANEOUS

## 2014-01-07 MED ORDER — VECURONIUM BROMIDE 10 MG IV SOLR
20.0000 mg | Freq: Once | INTRAVENOUS | Status: AC
  Administered 2014-01-07: 20 mg via INTRAVENOUS
  Filled 2014-01-07: qty 20

## 2014-01-07 MED ORDER — VECURONIUM BROMIDE 10 MG IV SOLR
INTRAVENOUS | Status: AC
  Filled 2014-01-07: qty 10

## 2014-01-07 MED ORDER — VECURONIUM BROMIDE 10 MG IV SOLR
INTRAVENOUS | Status: AC
  Administered 2014-01-07: 20 mg via INTRAVENOUS
  Filled 2014-01-07: qty 10

## 2014-01-07 MED ORDER — MIDAZOLAM HCL 2 MG/2ML IJ SOLN
INTRAMUSCULAR | Status: AC
  Filled 2014-01-07: qty 2

## 2014-01-07 MED ORDER — SODIUM CHLORIDE 0.9 % IJ SOLN
INTRAMUSCULAR | Status: DC | PRN
  Administered 2014-01-07 (×2): 10 mL

## 2014-01-07 SURGICAL SUPPLY — 26 items
DRAPE PROXIMA HALF (DRAPES) ×6 IMPLANT
DRAPE UTILITY 15X26 W/TAPE STR (DRAPE) ×6 IMPLANT
ELECT CAUTERY BLADE 6.4 (BLADE) ×3 IMPLANT
ELECT REM PT RETURN 9FT ADLT (ELECTROSURGICAL) ×3
ELECTRODE REM PT RTRN 9FT ADLT (ELECTROSURGICAL) ×1 IMPLANT
GAUZE SPONGE 4X4 16PLY XRAY LF (GAUZE/BANDAGES/DRESSINGS) ×3 IMPLANT
GLOVE BIO SURGEON STRL SZ7.5 (GLOVE) ×3 IMPLANT
GLOVE BIO SURGEON STRL SZ8 (GLOVE) ×6 IMPLANT
GLOVE BIOGEL PI IND STRL 7.5 (GLOVE) IMPLANT
GLOVE BIOGEL PI IND STRL 8 (GLOVE) ×2 IMPLANT
GLOVE BIOGEL PI INDICATOR 7.5 (GLOVE) ×2
GLOVE BIOGEL PI INDICATOR 8 (GLOVE) ×4
GOWN STRL REUS W/ TWL LRG LVL3 (GOWN DISPOSABLE) ×1 IMPLANT
GOWN STRL REUS W/ TWL XL LVL3 (GOWN DISPOSABLE) ×1 IMPLANT
GOWN STRL REUS W/TWL LRG LVL3 (GOWN DISPOSABLE) ×3
GOWN STRL REUS W/TWL XL LVL3 (GOWN DISPOSABLE) ×6
INTRODUCER TRACH BLUE RHINO 6F (TUBING) ×3 IMPLANT
INTRODUCER TRACH BLUE RHINO 8F (TUBING) IMPLANT
PENCIL BUTTON HOLSTER BLD 10FT (ELECTRODE) ×3 IMPLANT
SPONGE INTESTINAL PEANUT (DISPOSABLE) ×5 IMPLANT
SUT SILK 3 0 SH CR/8 (SUTURE) ×3 IMPLANT
SUT VICRYL AB 3 0 TIES (SUTURE) ×3 IMPLANT
TOWEL OR 17X24 6PK STRL BLUE (TOWEL DISPOSABLE) ×3 IMPLANT
TUBE CONNECTING 12'X1/4 (SUCTIONS) ×1
TUBE CONNECTING 12X1/4 (SUCTIONS) ×2 IMPLANT
YANKAUER SUCT BULB TIP NO VENT (SUCTIONS) ×3 IMPLANT

## 2014-01-07 SURGICAL SUPPLY — 1 items: peg (Peg) ×2 IMPLANT

## 2014-01-07 NOTE — Progress Notes (Signed)
Patient ID: Victor [name], male   DOB: Oct 24, 1990, 23 y.o.   MRN: [id number] I spoke with his uncle at the bedside. Violeta Gelinas, MD, MPH, FACS Trauma: [phone] General Surgery: [phone]
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
## 2014-01-07 NOTE — Progress Notes (Signed)
Patient ID: Victor [name], male   DOB: 16-Mar-1991, 23 y.o.   MRN: [id number] Stable, some movement to pain. Last ct head was seen. Will remove the drains in am
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
## 2014-01-08 ENCOUNTER — Inpatient Hospital Stay (HOSPITAL_COMMUNITY): Payer: Medicaid Other

## 2014-01-08 ENCOUNTER — Encounter (INDEPENDENT_AMBULATORY_CARE_PROVIDER_SITE_OTHER): Payer: Medicaid Other

## 2014-01-08 ENCOUNTER — Encounter (HOSPITAL_COMMUNITY): Payer: Self-pay | Admitting: General Surgery

## 2014-01-08 LAB — BASIC METABOLIC PANEL
BUN: 12 mg/dL (ref 6–23)
CHLORIDE: 99 meq/L (ref 96–112)
CO2: 31 mEq/L (ref 19–32)
Calcium: 9 mg/dL (ref 8.4–10.5)
Creatinine, Ser: 0.59 mg/dL (ref 0.50–1.35)
GFR calc Af Amer: >90 mL/min (ref >90)
GFR calc non Af Amer: >90 mL/min (ref >90)
GLUCOSE: 98 mg/dL (ref 70–99)
Potassium: 4.2 mEq/L (ref 3.7–5.3)
Sodium: 139 mEq/L (ref 137–147)

## 2014-01-08 LAB — CBC
HCT: 25.8 % — ABNORMAL LOW (ref 39.0–52.0)
Hemoglobin: 7.8 g/dL — ABNORMAL LOW (ref 13.0–17.0)
MCH: 22 pg — ABNORMAL LOW (ref 26.0–34.0)
MCHC: 30.2 g/dL (ref 30.0–36.0)
MCV: 72.7 fL — ABNORMAL LOW (ref 78.0–100.0)
PLATELETS: 419 10*3/uL — AB (ref 150–400)
RBC: 3.55 MIL/uL — AB (ref 4.22–5.81)
RDW: 16.1 % — ABNORMAL HIGH (ref 11.5–15.5)
WBC: 13.5 10*3/uL — AB (ref 4.0–10.5)

## 2014-01-08 MED ORDER — CLONAZEPAM 0.1 MG/ML ORAL SUSPENSION: 0.2500 mg | Freq: Two times a day (BID) | ORAL | Status: DC

## 2014-01-08 MED ORDER — QUETIAPINE FUMARATE 25 MG PO TABS
25.0000 mg | ORAL_TABLET | Freq: Two times a day (BID) | ORAL | Status: DC
  Administered 2014-01-08 (×2): 25 mg
  Filled 2014-01-08 (×4): qty 1

## 2014-01-08 MED ORDER — CLONAZEPAM 0.5 MG PO TABS
0.2500 mg | ORAL_TABLET | Freq: Two times a day (BID) | ORAL | Status: DC
  Administered 2014-01-08 (×2): 0.25 mg
  Filled 2014-01-08 (×2): qty 1

## 2014-01-08 MED ORDER — HYDROCODONE-ACETAMINOPHEN 7.5-325 MG/15ML PO SOLN
10.0000 mL | ORAL | Status: DC | PRN
  Administered 2014-01-09 – 2014-01-13 (×12): 10 mL
  Filled 2014-01-08 (×12): qty 15
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
## 2014-01-08 NOTE — Progress Notes (Signed)
Craniotomy site with less swelling. Pulsatil. Sedated.ct head over the weekend

## 2014-01-08 NOTE — Progress Notes (Signed)
Transported pt to ct scan.  When finished placed pt back on same vent setting,  Uneventful transport
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
## 2014-01-09 LAB — BASIC METABOLIC PANEL
BUN: 15 mg/dL (ref 6–23)
CO2: 32 meq/L (ref 19–32)
Calcium: 9 mg/dL (ref 8.4–10.5)
Chloride: 101 mEq/L (ref 96–112)
Creatinine, Ser: 0.62 mg/dL (ref 0.50–1.35)
GFR calc Af Amer: >90 mL/min (ref >90)
GFR calc non Af Amer: >90 mL/min (ref >90)
GLUCOSE: 108 mg/dL — AB (ref 70–99)
POTASSIUM: 4.2 meq/L (ref 3.7–5.3)
SODIUM: 142 meq/L (ref 137–147)

## 2014-01-09 MED ORDER — QUETIAPINE FUMARATE 25 MG PO TABS
25.0000 mg | ORAL_TABLET | Freq: Three times a day (TID) | ORAL | Status: DC
  Administered 2014-01-09 – 2014-01-11 (×6): 25 mg
  Filled 2014-01-09 (×9): qty 1

## 2014-01-09 MED ORDER — FENTANYL CITRATE 0.05 MG/ML IJ SOLN
INTRAMUSCULAR | Status: AC
  Filled 2014-01-09: qty 2

## 2014-01-09 MED ORDER — LEVETIRACETAM 100 MG/ML PO SOLN
500.0000 mg | Freq: Two times a day (BID) | ORAL | Status: DC
  Administered 2014-01-09 – 2014-01-23 (×29): 500 mg
  Filled 2014-01-09 (×30): qty 5

## 2014-01-09 MED ORDER — CLONAZEPAM 0.5 MG PO TABS
0.2500 mg | ORAL_TABLET | Freq: Three times a day (TID) | ORAL | Status: DC
  Administered 2014-01-09 – 2014-01-11 (×6): 0.25 mg
  Filled 2014-01-09 (×7): qty 1

## 2014-01-09 MED ORDER — FENTANYL CITRATE 0.05 MG/ML IJ SOLN
50.0000 ug | INTRAMUSCULAR | Status: DC | PRN
  Administered 2014-01-09: 50 ug via INTRAVENOUS
  Administered 2014-01-10: 100 ug via INTRAVENOUS
  Administered 2014-01-10: 50 ug via INTRAVENOUS
  Administered 2014-01-10 (×3): 100 ug via INTRAVENOUS
  Administered 2014-01-10: 50 ug via INTRAVENOUS
  Administered 2014-01-10 – 2014-01-13 (×23): 100 ug via INTRAVENOUS
  Filled 2014-01-09 (×31): qty 2

## 2014-01-09 NOTE — Progress Notes (Signed)
Patient ID: Victor [name], male   DOB: 11-30-1990, 23 y.o.   MRN: [id number] Neuro unchanged. Not f/c. Ct head tomorrow

## 2014-01-09 NOTE — Progress Notes (Signed)
Patient ID: Victor [name], male   DOB: Dec 01, 1990, 23 y.o.   MRN: [id number] I updated his uncle at the bedside. Violeta Gelinas, MD, MPH, FACS Trauma: [phone] General Surgery: [phone]
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
## 2014-01-10 ENCOUNTER — Inpatient Hospital Stay (HOSPITAL_COMMUNITY): Payer: Medicaid Other

## 2014-01-10 DIAGNOSIS — R509 Fever, unspecified: Secondary | ICD-10-CM

## 2014-01-10 LAB — CBC
HCT: 26.8 % — ABNORMAL LOW (ref 39.0–52.0)
Hemoglobin: 8.1 g/dL — ABNORMAL LOW (ref 13.0–17.0)
MCH: 22 pg — AB (ref 26.0–34.0)
MCHC: 30.2 g/dL (ref 30.0–36.0)
MCV: 72.8 fL — ABNORMAL LOW (ref 78.0–100.0)
Platelets: 523 10*3/uL — ABNORMAL HIGH (ref 150–400)
RBC: 3.68 MIL/uL — ABNORMAL LOW (ref 4.22–5.81)
RDW: 16.1 % — ABNORMAL HIGH (ref 11.5–15.5)
WBC: 19.3 10*3/uL — ABNORMAL HIGH (ref 4.0–10.5)

## 2014-01-10 MED ORDER — POLYETHYLENE GLYCOL 3350 17 G PO PACK
17.0000 g | PACK | Freq: Every day | ORAL | Status: DC
  Administered 2014-01-10 – 2014-01-13 (×4): 17 g
  Filled 2014-01-10 (×14): qty 1

## 2014-01-10 MED ORDER — PANTOPRAZOLE SODIUM 40 MG PO PACK
40.0000 mg | PACK | Freq: Every day | ORAL | Status: DC
  Administered 2014-01-10 – 2014-01-27 (×18): 40 mg
  Filled 2014-01-10 (×19): qty 20

## 2014-01-10 MED ORDER — PIPERACILLIN-TAZOBACTAM 3.375 G IVPB
3.3750 g | Freq: Three times a day (TID) | INTRAVENOUS | Status: DC
  Administered 2014-01-10 – 2014-01-13 (×9): 3.375 g via INTRAVENOUS
  Filled 2014-01-10 (×12): qty 50

## 2014-01-10 MED ORDER — ENOXAPARIN SODIUM 40 MG/0.4ML ~~LOC~~ SOLN
40.0000 mg | SUBCUTANEOUS | Status: DC
  Administered 2014-01-10 – 2014-01-27 (×16): 40 mg via SUBCUTANEOUS
  Filled 2014-01-10 (×16): qty 0.4

## 2014-01-10 MED ORDER — VANCOMYCIN HCL IN DEXTROSE 1-5 GM/200ML-% IV SOLN
1000.0000 mg | Freq: Three times a day (TID) | INTRAVENOUS | Status: DC
  Administered 2014-01-10 – 2014-01-11 (×3): 1000 mg via INTRAVENOUS
  Filled 2014-01-10 (×5): qty 200

## 2014-01-10 MED ORDER — PIVOT 1.5 CAL PO LIQD
1000.0000 mL | ORAL | Status: DC
  Administered 2014-01-10 – 2014-01-13 (×5): 1000 mL
  Filled 2014-01-10 (×8): qty 1000

## 2014-01-10 MED ORDER — VANCOMYCIN HCL 10 G IV SOLR
2000.0000 mg | Freq: Once | INTRAVENOUS | Status: AC
  Administered 2014-01-10: 2000 mg via INTRAVENOUS
  Filled 2014-01-10 (×2): qty 2000

## 2014-01-10 NOTE — Progress Notes (Signed)
Patient ID: Victor [name], male   DOB: Nov 26, 1990, 23 y.o.   MRN: [id number] Ct postop, no new bleeding. Abdominal incision dry but he had some drainage. Ok to transfer to a step down unit

## 2014-01-10 NOTE — Progress Notes (Signed)
Received pre-screen request from OT for pt and have reviewed pt's case. At this time, pt's behavior is consistent with a Rancho Level II. He is not an appropriate candidate for acute rehab at this point. However, if pt shows behaviors consistent with Rancho Level III to IV, then could consider possible rehab consult.   Please contact an admission coordinator as pt progresses with therapies and if his Rancho Level improves. We will also follow pt's status in trauma rounds.  Thanks.  Juliann Mule, PT Rehabilitation Admissions Coordinator [phone]'
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
## 2014-01-10 NOTE — Progress Notes (Signed)
Patient ID: Victor [name] XXXShaw, male   DOB: August 14, 1990, 23 y.o.   MRN: [id number] OK for Lovenox per Dr. Jeral FruitBotero. Violeta GelinasBurke Dahna Hattabaugh, MD, MPH, FACS Trauma: [phone] General Surgery: [phone]
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
## 2014-01-10 NOTE — Progress Notes (Signed)
Patient tolerating trach collar well at this time.  RT will continue to allow patient to wean on trach collar as tolerated throughout the night.

## 2014-01-11 ENCOUNTER — Inpatient Hospital Stay (HOSPITAL_COMMUNITY): Payer: Medicaid Other

## 2014-01-11 LAB — BASIC METABOLIC PANEL
BUN: 16 mg/dL (ref 6–23)
CHLORIDE: 101 meq/L (ref 96–112)
CO2: 32 mEq/L (ref 19–32)
Calcium: 9.3 mg/dL (ref 8.4–10.5)
Creatinine, Ser: 0.58 mg/dL (ref 0.50–1.35)
GFR calc Af Amer: >90 mL/min (ref >90)
Glucose, Bld: 104 mg/dL — ABNORMAL HIGH (ref 70–99)
POTASSIUM: 4.5 meq/L (ref 3.7–5.3)
Sodium: 142 mEq/L (ref 137–147)

## 2014-01-11 LAB — CBC WITH DIFFERENTIAL/PLATELET
BASOS ABS: 0 10*3/uL (ref 0.0–0.1)
Basophils Relative: 0 % (ref 0–1)
EOS PCT: 5 % (ref 0–5)
Eosinophils Absolute: 0.9 10*3/uL — ABNORMAL HIGH (ref 0.0–0.7)
HCT: 28 % — ABNORMAL LOW (ref 39.0–52.0)
Hemoglobin: 8.5 g/dL — ABNORMAL LOW (ref 13.0–17.0)
Lymphocytes Relative: 9 % — ABNORMAL LOW (ref 12–46)
Lymphs Abs: 1.7 10*3/uL (ref 0.7–4.0)
MCH: 21.9 pg — ABNORMAL LOW (ref 26.0–34.0)
MCHC: 30.4 g/dL (ref 30.0–36.0)
MCV: 72.2 fL — ABNORMAL LOW (ref 78.0–100.0)
MONOS PCT: 9 % (ref 3–12)
Monocytes Absolute: 1.7 10*3/uL — ABNORMAL HIGH (ref 0.1–1.0)
Neutro Abs: 14.2 10*3/uL — ABNORMAL HIGH (ref 1.7–7.7)
Neutrophils Relative %: 77 % (ref 43–77)
PLATELETS: 619 10*3/uL — AB (ref 150–400)
RBC: 3.88 MIL/uL — AB (ref 4.22–5.81)
RDW: 16.1 % — ABNORMAL HIGH (ref 11.5–15.5)
WBC: 18.5 10*3/uL — AB (ref 4.0–10.5)

## 2014-01-11 LAB — VANCOMYCIN, TROUGH: VANCOMYCIN TR: 6.1 ug/mL — AB (ref 10.0–20.0)

## 2014-01-11 MED ORDER — QUETIAPINE FUMARATE 50 MG PO TABS
50.0000 mg | ORAL_TABLET | Freq: Three times a day (TID) | ORAL | Status: DC
  Administered 2014-01-11 – 2014-01-13 (×6): 50 mg
  Filled 2014-01-11 (×9): qty 1

## 2014-01-11 MED ORDER — VANCOMYCIN HCL IN DEXTROSE 1-5 GM/200ML-% IV SOLN
1000.0000 mg | Freq: Four times a day (QID) | INTRAVENOUS | Status: DC
  Administered 2014-01-11 – 2014-01-13 (×6): 1000 mg via INTRAVENOUS
  Filled 2014-01-11 (×10): qty 200

## 2014-01-11 MED ORDER — CLONAZEPAM 0.5 MG PO TABS
0.5000 mg | ORAL_TABLET | Freq: Three times a day (TID) | ORAL | Status: DC
  Administered 2014-01-11 – 2014-01-20 (×26): 0.5 mg
  Filled 2014-01-11 (×26): qty 1
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
## 2014-01-11 NOTE — Progress Notes (Signed)
ANTIBIOTIC CONSULT NOTE - FOLLOW UP  Pharmacy Consult for vancomycin Indication: empiric (fever, leukocytosis)  No Known Allergies  Patient Measurements: Height: 6' (182.9 cm) Weight: 210 lb 4.8 oz (95.391 kg) IBW/kg (Calculated) : 77.6   Vital Signs: Temp: 98 F (36.7 C) (06/20 2020) Temp src: Axillary (06/20 2020) BP: 150/89 mmHg (06/20 2020) Pulse Rate: 140 (06/20 2020) Intake/Output from previous day: 06/19 0701 - 06/20 0700 In: 4795.6 [I.V.:1220; NG/GT:2313.1; IV Piggyback:1012.5] Out: 2530 [Urine:2530] Intake/Output from this shift: Total I/O In: -  Out: 300 [Urine:300]  Labs:  Recent Labs  01/09/14 0615 01/10/14 0544 01/11/14 1050  WBC  --  19.3* 18.5*  HGB  --  8.1* 8.5*  PLT  --  523* 619*  CREATININE 0.62  --  0.58   Estimated Creatinine Clearance: 173.5 ml/min (by C-G formula based on Cr of 0.58).  Recent Labs  01/11/14 2000  VANCOTROUGH 6.1*     Microbiology: Recent Results (from the past 720 hour(s))  MRSA PCR SCREENING     Status: None   Collection Time    12/31/13  7:35 AM      Result Value Ref Range Status   MRSA by PCR NEGATIVE  NEGATIVE Final   Comment:            The GeneXpert MRSA Assay (FDA     approved for NASAL specimens     only), is one component of a     comprehensive MRSA colonization     surveillance program. It is not     intended to diagnose MRSA     infection nor to guide or     monitor treatment for     MRSA infections.  CULTURE, RESPIRATORY (NON-EXPECTORATED)     Status: None   Collection Time    01/10/14  8:55 AM      Result Value Ref Range Status   Specimen Description TRACHEAL ASPIRATE   Final   Special Requests Normal   Final   Gram Stain     Final   Value: ABUNDANT WBC PRESENT, PREDOMINANTLY PMN     FEW SQUAMOUS EPITHELIAL CELLS PRESENT     FEW GRAM POSITIVE RODS     FEW GRAM POSITIVE COCCI     IN PAIRS RARE GRAM NEGATIVE RODS   Culture     Final   Value: Culture reincubated for better growth   Performed at [facility]   Report Status PENDING   Incomplete  URINE CULTURE     Status: None   Collection Time    01/10/14  9:18 AM      Result Value Ref Range Status   Specimen Description URINE, RANDOM   Final   Special Requests Normal   Final   Culture  Setup Time     Final   Value: 01/10/2014 14:06     Performed at [facility] Count     Final   Value: >=100,000 COLONIES/ML     Performed at [facility]   Culture     Final   Value: GRAM NEGATIVE RODS     Performed at [facility]   Report Status PENDING   Incomplete  CULTURE, BLOOD (ROUTINE X 2)     Status: None   Collection Time    01/10/14 11:30 AM      Result Value Ref Range Status   Specimen Description BLOOD LEFT HAND   Final   Special Requests BOTTLES DRAWN AEROBIC AND ANAEROBIC  [facility]   Final   Culture  Setup Time     Final   Value: 01/10/2014 14:00     Performed at [facility]   Culture     Final   Value: GRAM POSITIVE COCCI IN CLUSTERS     Note: Gram Stain Report Called to,Read Back By and Verified With: Lily PeerHOMAS LILLEY 01/11/14 0640A FULKC     Performed at [facility]   Report Status PENDING   Incomplete  CULTURE, BLOOD (ROUTINE X 2)     Status: None   Collection Time    01/10/14 11:40 AM      Result Value Ref Range Status   Specimen Description BLOOD LEFT FOREARM   Final   Special Requests BOTTLES DRAWN AEROBIC AND ANAEROBIC 5CC   Final   Culture  Setup Time     Final   Value: 01/10/2014 14:00     Performed at [facility]   Culture     Final   Value:        BLOOD CULTURE RECEIVED NO GROWTH TO DATE CULTURE WILL BE HELD FOR 5 DAYS BEFORE ISSUING A FINAL NEGATIVE REPORT     Performed at [facility]   Report Status PENDING   Incomplete    Anti-infectives   Start     Dose/Rate Route Frequency Ordered Stop   01/10/14 2000  vancomycin (VANCOCIN) IVPB 1000 mg/200 mL premix     1,000 mg 200 mL/hr over 60 Minutes Intravenous Every 8 hours  01/10/14 0954     01/10/14 1100  vancomycin (VANCOCIN) 2,000 mg in sodium chloride 0.9 % 500 mL IVPB     2,000 mg 250 mL/hr over 120 Minutes Intravenous  Once 01/10/14 0950 01/10/14 1517   01/10/14 0930  piperacillin-tazobactam (ZOSYN) IVPB 3.375 g     3.375 g 12.5 mL/hr over 240 Minutes Intravenous Every 8 hours 01/10/14 0842     01/07/14 1000  ceFAZolin (ANCEF) IVPB 2 g/50 mL premix     2 g 100 mL/hr over 30 Minutes Intravenous 60 min pre-op 01/06/14 1109 01/07/14 1026   01/02/14 0800  [MAR Hold]  ceFAZolin (ANCEF) IVPB 2 g/50 mL premix     (On MAR Hold since 01/02/14 0819)   2 g 100 mL/hr over 30 Minutes Intravenous  Once 01/01/14 0935 01/02/14 0825   12/31/13 0800  ceFAZolin (ANCEF) IVPB 2 g/50 mL premix     2 g 100 mL/hr over 30 Minutes Intravenous 3 times per day 12/31/13 0729 12/31/13 1656      Assessment: Patient is a 23 y.o M on vancomycin and zosyn for broad coverage.  Vancomycin trough level now back subtherapeutic at 6.1.  Vanc 6/19 >> Zosyn 6/19 >>  6/19 BCx >>1/2 GPC 6/19 Ucx >> GNR 6/19 TA>> few GPR, GPC, GNR   Goal of Therapy:  Vancomycin trough level = 15-20  Plan:  1) change vancomycin to 1gm IV q6h 2) will recheck another level at steady state  Pham, Anh P 01/11/2014,9:05 PM

## 2014-01-11 NOTE — Progress Notes (Signed)
CRITICAL VALUE ALERT  Critical value received:  Gram Positive Cocci in Clusters cultured in anaerobic Blood Culture  Date of notification:  01/11/14  Time of notification:  0640  Critical value read back: Yes  Nurse who received alert: Lily Peerhomas Lilley   MD notified (1st page):  Violeta GelinasBurke Thompson  Time of first page:  [phone]  Responding MD:  Violeta GelinasBurke Thompson  Time MD responded:  [phone]

## 2014-01-12 LAB — CULTURE, RESPIRATORY W GRAM STAIN

## 2014-01-12 LAB — URINE CULTURE: SPECIAL REQUESTS: NORMAL

## 2014-01-12 LAB — URINALYSIS, ROUTINE W REFLEX MICROSCOPIC
Bilirubin Urine: NEGATIVE
Glucose, UA: NEGATIVE mg/dL
Hgb urine dipstick: NEGATIVE
Ketones, ur: NEGATIVE mg/dL
Leukocytes, UA: NEGATIVE
Nitrite: NEGATIVE
Protein, ur: NEGATIVE mg/dL
SPECIFIC GRAVITY, URINE: 1.028 (ref 1.005–1.030)
UROBILINOGEN UA: 1 mg/dL (ref 0.0–1.0)
pH: 7 (ref 5.0–8.0)

## 2014-01-12 LAB — CULTURE, BLOOD (ROUTINE X 2)

## 2014-01-12 LAB — BASIC METABOLIC PANEL
BUN: 18 mg/dL (ref 6–23)
CO2: 30 mEq/L (ref 19–32)
Calcium: 10.4 mg/dL (ref 8.4–10.5)
Chloride: 100 mEq/L (ref 96–112)
Creatinine, Ser: 0.73 mg/dL (ref 0.50–1.35)
GFR calc non Af Amer: >90 mL/min (ref >90)
GLUCOSE: 104 mg/dL — AB (ref 70–99)
POTASSIUM: 5.2 meq/L (ref 3.7–5.3)
Sodium: 144 mEq/L (ref 137–147)

## 2014-01-12 LAB — CBC
HCT: 32.9 % — ABNORMAL LOW (ref 39.0–52.0)
HEMOGLOBIN: 10 g/dL — AB (ref 13.0–17.0)
MCH: 22.3 pg — ABNORMAL LOW (ref 26.0–34.0)
MCHC: 30.4 g/dL (ref 30.0–36.0)
MCV: 73.4 fL — ABNORMAL LOW (ref 78.0–100.0)
Platelets: 826 10*3/uL — ABNORMAL HIGH (ref 150–400)
RBC: 4.48 MIL/uL (ref 4.22–5.81)
RDW: 16.5 % — ABNORMAL HIGH (ref 11.5–15.5)
WBC: 24.9 10*3/uL — ABNORMAL HIGH (ref 4.0–10.5)

## 2014-01-12 LAB — CULTURE, RESPIRATORY: SPECIAL REQUESTS: NORMAL

## 2014-01-12 NOTE — Progress Notes (Signed)
PICC D/Cd. Checking U/A. He F/C to squeeze hand and move thumb on R. Patient examined and I agree with the assessment and plan  Violeta Gelinas, MD, MPH, FACS Trauma: [phone] General Surgery: [phone]  01/12/2014 11:38 AM
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
## 2014-01-13 ENCOUNTER — Encounter (HOSPITAL_COMMUNITY): Payer: Self-pay | Admitting: *Deleted

## 2014-01-13 LAB — COMPREHENSIVE METABOLIC PANEL
ALK PHOS: 160 U/L — AB (ref 39–117)
ALT: 103 U/L — ABNORMAL HIGH (ref 0–53)
AST: 65 U/L — ABNORMAL HIGH (ref 0–37)
Albumin: 2.9 g/dL — ABNORMAL LOW (ref 3.5–5.2)
BUN: 18 mg/dL (ref 6–23)
CO2: 28 meq/L (ref 19–32)
Calcium: 9.5 mg/dL (ref 8.4–10.5)
Chloride: 98 mEq/L (ref 96–112)
Creatinine, Ser: 0.71 mg/dL (ref 0.50–1.35)
Glucose, Bld: 103 mg/dL — ABNORMAL HIGH (ref 70–99)
Potassium: 4.3 mEq/L (ref 3.7–5.3)
Sodium: 139 mEq/L (ref 137–147)
TOTAL PROTEIN: 8.3 g/dL (ref 6.0–8.3)
Total Bilirubin: 0.6 mg/dL (ref 0.3–1.2)

## 2014-01-13 LAB — CBC
HEMATOCRIT: 30.6 % — AB (ref 39.0–52.0)
HEMOGLOBIN: 9.4 g/dL — AB (ref 13.0–17.0)
MCH: 22.2 pg — AB (ref 26.0–34.0)
MCHC: 30.7 g/dL (ref 30.0–36.0)
MCV: 72.3 fL — ABNORMAL LOW (ref 78.0–100.0)
Platelets: 744 10*3/uL — ABNORMAL HIGH (ref 150–400)
RBC: 4.23 MIL/uL (ref 4.22–5.81)
RDW: 16.5 % — ABNORMAL HIGH (ref 11.5–15.5)
WBC: 16.3 10*3/uL — ABNORMAL HIGH (ref 4.0–10.5)

## 2014-01-13 LAB — VANCOMYCIN, TROUGH
VANCOMYCIN TR: 14.1 ug/mL (ref 10.0–20.0)
Vancomycin Tr: 25.8 ug/mL (ref 10.0–20.0)

## 2014-01-13 MED ORDER — QUETIAPINE FUMARATE 100 MG PO TABS
100.0000 mg | ORAL_TABLET | Freq: Three times a day (TID) | ORAL | Status: DC
  Administered 2014-01-13 – 2014-01-20 (×20): 100 mg
  Filled 2014-01-13 (×25): qty 1

## 2014-01-13 MED ORDER — HYDROCODONE-ACETAMINOPHEN 7.5-325 MG/15ML PO SOLN
20.0000 mL | ORAL | Status: DC | PRN
  Administered 2014-01-13 – 2014-01-25 (×20): 20 mL
  Filled 2014-01-13 (×22): qty 30

## 2014-01-13 MED ORDER — MORPHINE SULFATE 2 MG/ML IJ SOLN
2.0000 mg | INTRAMUSCULAR | Status: DC | PRN
  Administered 2014-01-16 – 2014-01-20 (×2): 2 mg via INTRAVENOUS
  Filled 2014-01-13 (×2): qty 1

## 2014-01-13 MED ORDER — BACITRACIN ZINC 500 UNIT/GM EX OINT
TOPICAL_OINTMENT | Freq: Two times a day (BID) | CUTANEOUS | Status: DC
  Administered 2014-01-13 – 2014-01-18 (×11): via TOPICAL
  Administered 2014-01-18: 1 via TOPICAL
  Administered 2014-01-19 (×2): via TOPICAL
  Administered 2014-01-20: 1 via TOPICAL
  Filled 2014-01-13: qty 28.35

## 2014-01-13 MED ORDER — PROPRANOLOL HCL 20 MG/5ML PO SOLN
40.0000 mg | Freq: Four times a day (QID) | ORAL | Status: DC
  Administered 2014-01-13 – 2014-01-27 (×56): 40 mg
  Filled 2014-01-13 (×64): qty 10

## 2014-01-13 MED ORDER — CIPROFLOXACIN IN D5W 400 MG/200ML IV SOLN
400.0000 mg | Freq: Two times a day (BID) | INTRAVENOUS | Status: DC
  Administered 2014-01-13 – 2014-01-20 (×14): 400 mg via INTRAVENOUS
  Filled 2014-01-13 (×16): qty 200

## 2014-01-13 NOTE — Progress Notes (Signed)
Critical Vanc trough called to pharmacy,  They will reorder Redraw

## 2014-01-13 NOTE — Progress Notes (Signed)
ANTIBIOTIC CONSULT NOTE - FOLLOW UP  Pharmacy Consult for vancomycin Indication: empiric (fever, leukocytosis)  No Known Allergies  Patient Measurements: Height: 6' (182.9 cm) Weight: 210 lb 4.8 oz (95.391 kg) IBW/kg (Calculated) : 77.6   Vital Signs: Temp: 98.4 F (36.9 C) (06/22 0351) Temp src: Oral (06/22 0351) BP: 137/89 mmHg (06/22 0450) Pulse Rate: 87 (06/22 0450) Intake/Output from previous day: 06/21 0701 - 06/22 0700 In: 2325 [I.V.:850; NG/GT:1475] Out: 2250 [Urine:2250] Intake/Output from this shift: Total I/O In: 865 [I.V.:250; NG/GT:615] Out: 975 [Urine:975]  Labs:  Recent Labs  01/11/14 1050 01/12/14 0330 01/13/14 0210  WBC 18.5* 24.9* 16.3*  HGB 8.5* 10.0* 9.4*  PLT 619* 826* 744*  CREATININE 0.58 0.73 0.71   Estimated Creatinine Clearance: 173.5 ml/min (by C-G formula based on Cr of 0.71).  Recent Labs  01/13/14 0210 01/13/14 0530  VANCOTROUGH 25.8* 14.1     Microbiology: Recent Results (from the past 720 hour(s))  MRSA PCR SCREENING     Status: None   Collection Time    12/31/13  7:35 AM      Result Value Ref Range Status   MRSA by PCR NEGATIVE  NEGATIVE Final   Comment:            The GeneXpert MRSA Assay (FDA     approved for NASAL specimens     only), is one component of a     comprehensive MRSA colonization     surveillance program. It is not     intended to diagnose MRSA     infection nor to guide or     monitor treatment for     MRSA infections.  CULTURE, RESPIRATORY (NON-EXPECTORATED)     Status: None   Collection Time    01/10/14  8:55 AM      Result Value Ref Range Status   Specimen Description TRACHEAL ASPIRATE   Final   Special Requests Normal   Final   Gram Stain     Final   Value: ABUNDANT WBC PRESENT, PREDOMINANTLY PMN     FEW SQUAMOUS EPITHELIAL CELLS PRESENT     FEW GRAM POSITIVE RODS     FEW GRAM POSITIVE COCCI     IN PAIRS RARE GRAM NEGATIVE RODS   Culture     Final   Value: MODERATE STREPTOCOCCUS,BETA  HEMOLYTIC NOT GROUP A     Performed at [facility]   Report Status 01/12/2014 FINAL   Final  URINE CULTURE     Status: None   Collection Time    01/10/14  9:18 AM      Result Value Ref Range Status   Specimen Description URINE, RANDOM   Final   Special Requests Normal   Final   Culture  Setup Time     Final   Value: 01/10/2014 14:06     Performed at [facility]   Colony Count     Final   Value: >=100,000 COLONIES/ML     Performed at [facility]   Culture     Final   Value: ENTEROBACTER CLOACAE     Performed at [facility]   Report Status 01/12/2014 FINAL   Final   Organism ID, Bacteria ENTEROBACTER CLOACAE   Final  CULTURE, BLOOD (ROUTINE X 2)     Status: None   Collection Time    01/10/14 11:30 AM      Result Value Ref Range Status   Specimen Description BLOOD LEFT HAND   Final  Special Requests BOTTLES DRAWN AEROBIC AND ANAEROBIC 2CC   Final   Culture  Setup Time     Final   Value: 01/10/2014 14:00     Performed at [facility]   Culture     Final   Value: STAPHYLOCOCCUS SPECIES (COAGULASE NEGATIVE)     Note: THE SIGNIFICANCE OF ISOLATING THIS ORGANISM FROM A SINGLE SET OF BLOOD CULTURES WHEN MULTIPLE SETS ARE DRAWN IS UNCERTAIN. PLEASE NOTIFY THE MICROBIOLOGY DEPARTMENT WITHIN ONE WEEK IF SPECIATION AND SENSITIVITIES ARE REQUIRED.     Note: Gram Stain Report Called to,Read Back By and Verified With: Lily Peer 01/11/14 0640A FULKC     Performed at [facility]   Report Status 01/12/2014 FINAL   Final  CULTURE, BLOOD (ROUTINE X 2)     Status: None   Collection Time    01/10/14 11:40 AM      Result Value Ref Range Status   Specimen Description BLOOD LEFT FOREARM   Final   Special Requests BOTTLES DRAWN AEROBIC AND ANAEROBIC 5CC   Final   Culture  Setup Time     Final   Value: 01/10/2014 14:00     Performed at [facility]   Culture     Final   Value:        BLOOD CULTURE RECEIVED NO GROWTH TO DATE CULTURE  WILL BE HELD FOR 5 DAYS BEFORE ISSUING A FINAL NEGATIVE REPORT     Performed at [facility]   Report Status PENDING   Incomplete    Anti-infectives   Start     Dose/Rate Route Frequency Ordered Stop   01/11/14 2200  vancomycin (VANCOCIN) IVPB 1000 mg/200 mL premix     1,000 mg 200 mL/hr over 60 Minutes Intravenous Every 6 hours 01/11/14 2121     01/10/14 2000  vancomycin (VANCOCIN) IVPB 1000 mg/200 mL premix  Status:  Discontinued     1,000 mg 200 mL/hr over 60 Minutes Intravenous Every 8 hours 01/10/14 0954 01/11/14 2121   01/10/14 1100  vancomycin (VANCOCIN) 2,000 mg in sodium chloride 0.9 % 500 mL IVPB     2,000 mg 250 mL/hr over 120 Minutes Intravenous  Once 01/10/14 0950 01/10/14 1517   01/10/14 0930  piperacillin-tazobactam (ZOSYN) IVPB 3.375 g     3.375 g 12.5 mL/hr over 240 Minutes Intravenous Every 8 hours 01/10/14 0842     01/07/14 1000  ceFAZolin (ANCEF) IVPB 2 g/50 mL premix     2 g 100 mL/hr over 30 Minutes Intravenous 60 min pre-op 01/06/14 1109 01/07/14 1026   01/02/14 0800  [MAR Hold]  ceFAZolin (ANCEF) IVPB 2 g/50 mL premix     (On MAR Hold since 01/02/14 0819)   2 g 100 mL/hr over 30 Minutes Intravenous  Once 01/01/14 0935 01/02/14 0825   12/31/13 0800  ceFAZolin (ANCEF) IVPB 2 g/50 mL premix     2 g 100 mL/hr over 30 Minutes Intravenous 3 times per day 12/31/13 0729 12/31/13 1656      Assessment: Patient is a 23 y.o M on vancomycin and zosyn for broad coverage.  Vancomycin trough level reported as 25.8 mg/L, was drawn early.  Repeat lab drawn at correct time 14.1 mg/L  Vanc 6/19 >> Zosyn 6/19 >>  6/19 BCx >>1/2 GPC 6/19 Ucx >> GNR 6/19 TA>> few GPR, GPC, GNR   Goal of Therapy:  Vancomycin trough level = 15-20  Plan:  1) cont vancomycin to 1gm IV q6h   Seay, Lora  Poteet 01/13/2014,6:17 AM

## 2014-01-13 NOTE — Progress Notes (Signed)
Patient more of a Rancho III today.  Will not respond to anything for me.  Stable and not storming currently.  This patient has been seen and I agree with the findings and treatment plan.  Marta Lamas. Gae Bon, MD, FACS [phone] (pager) [phone] (direct pager) Trauma Surgeon
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
## 2014-01-13 NOTE — Progress Notes (Signed)
Removed sutures from left leg per md orders
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
## 2014-01-13 NOTE — Progress Notes (Signed)
Neuro stable. Continue with the therapy  staples out in am
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
## 2014-01-14 ENCOUNTER — Encounter (HOSPITAL_COMMUNITY): Payer: Self-pay | Admitting: Certified Registered Nurse Anesthetist

## 2014-01-14 ENCOUNTER — Inpatient Hospital Stay (HOSPITAL_COMMUNITY): Payer: Medicaid Other | Admitting: Anesthesiology

## 2014-01-14 ENCOUNTER — Encounter (HOSPITAL_COMMUNITY): Payer: Medicaid Other | Admitting: Anesthesiology

## 2014-01-14 ENCOUNTER — Encounter (HOSPITAL_COMMUNITY): Admission: EM | Disposition: A | Discharge status: Skilled Nursing Facility | Payer: Self-pay | Source: Home / Self Care

## 2014-01-14 HISTORY — PX: ORIF MANDIBULAR FRACTURE: SHX2127

## 2014-01-14 LAB — CBC
HEMATOCRIT: 31.4 % — AB (ref 39.0–52.0)
Hemoglobin: 9.5 g/dL — ABNORMAL LOW (ref 13.0–17.0)
MCH: 22 pg — ABNORMAL LOW (ref 26.0–34.0)
MCHC: 30.3 g/dL (ref 30.0–36.0)
MCV: 72.9 fL — ABNORMAL LOW (ref 78.0–100.0)
PLATELETS: 684 10*3/uL — AB (ref 150–400)
RBC: 4.31 MIL/uL (ref 4.22–5.81)
RDW: 16.6 % — ABNORMAL HIGH (ref 11.5–15.5)
WBC: 13.8 10*3/uL — AB (ref 4.0–10.5)

## 2014-01-14 LAB — GLUCOSE, CAPILLARY
Glucose-Capillary: 94 mg/dL (ref 70–99)
Glucose-Capillary: 96 mg/dL (ref 70–99)

## 2014-01-14 SURGERY — OPEN REDUCTION INTERNAL FIXATION (ORIF) MANDIBULAR FRACTURE
Anesthesia: General | Site: Mouth | Laterality: Left

## 2014-01-14 MED ORDER — LIDOCAINE-EPINEPHRINE 1 %-1:100000 IJ SOLN
INTRAMUSCULAR | Status: DC | PRN
  Administered 2014-01-14: 10 mL

## 2014-01-14 MED ORDER — LACTATED RINGERS IV SOLN
INTRAVENOUS | Status: DC
  Administered 2014-01-16 – 2014-01-19 (×3): via INTRAVENOUS

## 2014-01-14 MED ORDER — 0.9 % SODIUM CHLORIDE (POUR BTL) OPTIME
TOPICAL | Status: DC | PRN
  Administered 2014-01-14: 1000 mL

## 2014-01-14 MED ORDER — LIDOCAINE-EPINEPHRINE 1 %-1:100000 IJ SOLN
INTRAMUSCULAR | Status: AC
  Filled 2014-01-14: qty 1

## 2014-01-14 MED ORDER — ONDANSETRON HCL 4 MG/2ML IJ SOLN: 4.0000 mg | Freq: Four times a day (QID) | INTRAMUSCULAR | Status: DC | PRN

## 2014-01-14 MED ORDER — ONDANSETRON HCL 4 MG/2ML IJ SOLN
INTRAMUSCULAR | Status: DC | PRN
  Administered 2014-01-14: 4 mg via INTRAVENOUS

## 2014-01-14 MED ORDER — FENTANYL CITRATE 0.05 MG/ML IJ SOLN
INTRAMUSCULAR | Status: DC | PRN
  Administered 2014-01-14 (×4): 25 ug via INTRAVENOUS
  Administered 2014-01-14: 50 ug via INTRAVENOUS

## 2014-01-14 MED ORDER — PHENYLEPHRINE HCL 10 MG/ML IJ SOLN
INTRAMUSCULAR | Status: DC | PRN
  Administered 2014-01-14: 80 ug via INTRAVENOUS
  Administered 2014-01-14 (×2): 40 ug via INTRAVENOUS
  Administered 2014-01-14: 80 ug via INTRAVENOUS
  Administered 2014-01-14: 120 ug via INTRAVENOUS

## 2014-01-14 MED ORDER — GLYCOPYRROLATE 0.2 MG/ML IJ SOLN
INTRAMUSCULAR | Status: DC | PRN
  Administered 2014-01-14: 0.4 mg via INTRAVENOUS

## 2014-01-14 MED ORDER — LACTATED RINGERS IV SOLN
INTRAVENOUS | Status: DC | PRN
  Administered 2014-01-14 (×2): via INTRAVENOUS

## 2014-01-14 MED ORDER — ONDANSETRON HCL 4 MG/2ML IJ SOLN
INTRAMUSCULAR | Status: AC
  Filled 2014-01-14: qty 2

## 2014-01-14 MED ORDER — ROCURONIUM BROMIDE 100 MG/10ML IV SOLN
INTRAVENOUS | Status: DC | PRN
  Administered 2014-01-14: 25 mg via INTRAVENOUS

## 2014-01-14 MED ORDER — NEOSTIGMINE METHYLSULFATE 10 MG/10ML IV SOLN
INTRAVENOUS | Status: DC | PRN
  Administered 2014-01-14: 3 mg via INTRAVENOUS

## 2014-01-14 MED ORDER — NEOSTIGMINE METHYLSULFATE 10 MG/10ML IV SOLN
INTRAVENOUS | Status: AC
  Filled 2014-01-14: qty 1

## 2014-01-14 MED ORDER — HYDROMORPHONE HCL PF 1 MG/ML IJ SOLN: 0.2500 mg | INTRAMUSCULAR | Status: DC | PRN

## 2014-01-14 MED ORDER — GLYCOPYRROLATE 0.2 MG/ML IJ SOLN
INTRAMUSCULAR | Status: AC
  Filled 2014-01-14: qty 2

## 2014-01-14 MED ORDER — OXYCODONE HCL 5 MG PO TABS
5.0000 mg | ORAL_TABLET | Freq: Once | ORAL | Status: DC | PRN
Start: 2014-01-14 — End: 2014-01-14

## 2014-01-14 MED ORDER — FENTANYL CITRATE 0.05 MG/ML IJ SOLN
INTRAMUSCULAR | Status: AC
  Filled 2014-01-14: qty 5

## 2014-01-14 MED ORDER — OXYCODONE HCL 5 MG/5ML PO SOLN: 5.0000 mg | Freq: Once | ORAL | Status: DC | PRN

## 2014-01-14 MED ORDER — BUPIVACAINE-EPINEPHRINE (PF) 0.5% -1:200000 IJ SOLN
INTRAMUSCULAR | Status: AC
Start: 2014-01-14 — End: 2014-01-14
  Filled 2014-01-14: qty 30

## 2014-01-14 MED ORDER — ROCURONIUM BROMIDE 50 MG/5ML IV SOLN
INTRAVENOUS | Status: AC
  Filled 2014-01-14: qty 1

## 2014-01-14 MED ORDER — LIDOCAINE HCL (CARDIAC) 20 MG/ML IV SOLN
INTRAVENOUS | Status: DC | PRN
  Administered 2014-01-14: 60 mg via INTRAVENOUS

## 2014-01-14 MED ORDER — OXYMETAZOLINE HCL 0.05 % NA SOLN
NASAL | Status: AC
  Filled 2014-01-14: qty 15

## 2014-01-14 SURGICAL SUPPLY — 59 items
ATTRACTOMAT 16X20 MAGNETIC DRP (DRAPES) ×3 IMPLANT
BAND DENTAL 1/4IN PULL MED (MISCELLANEOUS) IMPLANT
BAND DENTAL 3/16IN PULL HEAVY (MISCELLANEOUS) ×3 IMPLANT
BLADE 10 SAFETY STRL DISP (BLADE) ×3 IMPLANT
BLADE SURG 15 STRL LF DISP TIS (BLADE) ×2 IMPLANT
BLADE SURG 15 STRL SS (BLADE) ×6
BUR CROSS CUT (BURR)
BUR CROSS CUT FISSURE 1.6 (BURR) IMPLANT
BUR CROSS CUT FISSURE 1.6MM (BURR)
BUR RND FLUTED 2.5 (BURR) IMPLANT
BUR SRG MED 1.2XXCUT FSSR (BURR) IMPLANT
BUR SRG MED 1.6XXCUT FSSR (BURR) IMPLANT
BUR SURG 4X8 MED (BURR) IMPLANT
BURR SRG MED 1.2XXCUT FSSR (BURR)
BURR SRG MED 1.6XXCUT FSSR (BURR)
BURR SURG 4MMX8MM MEDIUM (BURR)
BURR SURG 4X8 MED (BURR)
CANISTER SUCTION 2500CC (MISCELLANEOUS) ×3 IMPLANT
CLEANER TIP ELECTROSURG 2X2 (MISCELLANEOUS) IMPLANT
COVER SURGICAL LIGHT HANDLE (MISCELLANEOUS) ×3 IMPLANT
DRESSING TELFA 8X3 (GAUZE/BANDAGES/DRESSINGS) IMPLANT
ELECT COATED BLADE 2.86 ST (ELECTRODE) ×3 IMPLANT
ELECT REM PT RETURN 9FT ADLT (ELECTROSURGICAL)
ELECTRODE REM PT RTRN 9FT ADLT (ELECTROSURGICAL) IMPLANT
GAUZE PACKING FOLDED 2  STR (GAUZE/BANDAGES/DRESSINGS) ×2
GAUZE PACKING FOLDED 2 STR (GAUZE/BANDAGES/DRESSINGS) ×1 IMPLANT
GLOVE BIOGEL PI IND STRL 6 (GLOVE) ×1 IMPLANT
GLOVE BIOGEL PI IND STRL 6.5 (GLOVE) IMPLANT
GLOVE BIOGEL PI IND STRL 7.5 (GLOVE) ×1 IMPLANT
GLOVE BIOGEL PI INDICATOR 6 (GLOVE) ×2
GLOVE BIOGEL PI INDICATOR 6.5 (GLOVE) ×2
GLOVE BIOGEL PI INDICATOR 7.5 (GLOVE) ×2
GLOVE ORTHO TXT STRL SZ7.5 (GLOVE) ×3 IMPLANT
GOWN STRL REUS W/ TWL LRG LVL3 (GOWN DISPOSABLE) ×2 IMPLANT
GOWN STRL REUS W/TWL LRG LVL3 (GOWN DISPOSABLE) ×6
KIT BASIN OR (CUSTOM PROCEDURE TRAY) ×3 IMPLANT
KIT ROOM TURNOVER OR (KITS) ×3 IMPLANT
NDL BLUNT 16X1.5 OR ONLY (NEEDLE) ×1 IMPLANT
NEEDLE BLUNT 16X1.5 OR ONLY (NEEDLE) ×3 IMPLANT
NEEDLE DENTAL 27 LONG (NEEDLE) ×6 IMPLANT
NS IRRIG 1000ML POUR BTL (IV SOLUTION) ×3 IMPLANT
PAD ARMBOARD 7.5X6 YLW CONV (MISCELLANEOUS) ×6 IMPLANT
PENCIL BUTTON HOLSTER BLD 10FT (ELECTRODE) IMPLANT
PLATE HYBRID MMF SM (Plate) ×4 IMPLANT
SCISSORS WIRE ANG 4 3/4 DISP (INSTRUMENTS) IMPLANT
SCREW LOCK SELFDRIL 2.0X8M MMF (Screw) ×34 IMPLANT
SPONGE GAUZE 4X4 12PLY (GAUZE/BANDAGES/DRESSINGS) IMPLANT
SUT CHROMIC 3 0 PS 2 (SUTURE) IMPLANT
SUT STEEL 0 (SUTURE)
SUT STEEL 0 18XMFL TIE 17 (SUTURE) ×1 IMPLANT
SUT STEEL 2 (SUTURE) ×3 IMPLANT
SYR 50ML SLIP (SYRINGE) ×3 IMPLANT
TOOTHBRUSH ADULT (PERSONAL CARE ITEMS) ×3 IMPLANT
TOWEL OR 17X24 6PK STRL BLUE (TOWEL DISPOSABLE) ×3 IMPLANT
TOWEL OR 17X26 10 PK STRL BLUE (TOWEL DISPOSABLE) ×3 IMPLANT
TRAY ENT MC OR (CUSTOM PROCEDURE TRAY) ×3 IMPLANT
TUBING IRRIGATION (MISCELLANEOUS) IMPLANT
WATER STERILE IRR 1000ML POUR (IV SOLUTION) ×3 IMPLANT
YANKAUER SUCT BULB TIP NO VENT (SUCTIONS) ×3 IMPLANT
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
## 2014-01-14 NOTE — Progress Notes (Signed)
Physical medicine and rehabilitation consult requested. Chart has been reviewed. Await completion of ORIF mandibular fracture today and follow up with formal rehabilitation consult
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
## 2014-01-14 NOTE — Anesthesia Procedure Notes (Signed)
Date/Time: 01/14/2014 6:10 PM Performed by: Elberta Leatherwood Pre-anesthesia Checklist: Patient identified, Emergency Drugs available, Patient being monitored and Suction available Patient Re-evaluated:Patient Re-evaluated prior to inductionOxygen Delivery Method: Circle system utilized Preoxygenation: Pre-oxygenation with 100% oxygen Intubation Type: Inhalational induction Ventilation: Mask ventilation without difficulty Tube size: 6.0 mm Airway Equipment and Method: Tracheostomy Placement Confirmation: positive ETCO2 and breath sounds checked- equal and bilateral Dental Injury: Teeth and Oropharynx as per pre-operative assessment
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
## 2014-01-14 NOTE — Progress Notes (Signed)
UR completed. CIR vs SNF at d/c.  Carlyle Lipa, RN BSN MHA CCM Trauma/Neuro ICU Case Manager [phone]

## 2014-01-14 NOTE — Interval H&P Note (Signed)
History and Physical Interval Note:  01/14/2014 5:43 PM  Victor Evangelistave XXXShaw  has presented today for surgery, with the diagnosis of LEFT SUBCONDYLAR PARASYMPHASIS FRACTURE  The various methods of treatment have been discussed with the patient and family. After consideration of risks, benefits and other options for treatment, the patient has consented to  Procedure(s): LEFT OPEN REDUCTION INTERNAL FIXATION (ORIF) MAXILLARY MANDIBULAR FIXATION (Left) as a surgical intervention .  The patient's history has been reviewed, patient examined, no change in status, stable for surgery.  I have reviewed the patient's chart and labs.  Questions were answered to the patient's satisfaction.     [place],Victor [name]

## 2014-01-14 NOTE — OR Nursing (Addendum)
Throat pack in at 1836-1920, Dr. Jeanice Limurham positioned the head with 1000 bag saline on each side of head and taped across forehead and secured the tape to table.  Front part of cervical collar was then removed and then reapplied at surgical procedure end.   Jaw is wired together with 24ga wire and rubber bands.  Wire scissors and rubber bands sent with patient.

## 2014-01-14 NOTE — Transfer of Care (Signed)
Immediate Anesthesia Transfer of Care Note  Patient: Victor [name]  Procedure(s) Performed: Procedure(s): LEFT OPEN REDUCTION INTERNAL FIXATION (ORIF) MAXILLARY MANDIBULAR FIXATION (Left)  Patient Location: PACU  Anesthesia Type:General  Level of Consciousness: responds to stimulation  Airway & Oxygen Therapy: Patient Spontanous Breathing and Patient connected to T-piece oxygen  Post-op Assessment: Report given to PACU RN and Post -op Vital signs reviewed and stable  Post vital signs: Reviewed and stable  Complications: No apparent anesthesia complications
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
## 2014-01-15 DIAGNOSIS — S065X9A Traumatic subdural hemorrhage with loss of consciousness of unspecified duration, initial encounter: Secondary | ICD-10-CM

## 2014-01-15 DIAGNOSIS — S82109A Unspecified fracture of upper end of unspecified tibia, initial encounter for closed fracture: Secondary | ICD-10-CM

## 2014-01-15 DIAGNOSIS — S065XAA Traumatic subdural hemorrhage with loss of consciousness status unknown, initial encounter: Secondary | ICD-10-CM

## 2014-01-15 LAB — CBC
HEMATOCRIT: 32.3 % — AB (ref 39.0–52.0)
HEMOGLOBIN: 9.8 g/dL — AB (ref 13.0–17.0)
MCH: 22.1 pg — ABNORMAL LOW (ref 26.0–34.0)
MCHC: 30.3 g/dL (ref 30.0–36.0)
MCV: 72.7 fL — ABNORMAL LOW (ref 78.0–100.0)
Platelets: 761 10*3/uL — ABNORMAL HIGH (ref 150–400)
RBC: 4.44 MIL/uL (ref 4.22–5.81)
RDW: 16.9 % — ABNORMAL HIGH (ref 11.5–15.5)
WBC: 12.3 10*3/uL — ABNORMAL HIGH (ref 4.0–10.5)

## 2014-01-15 MED ORDER — PIVOT 1.5 CAL PO LIQD
1000.0000 mL | ORAL | Status: DC
  Administered 2014-01-15 – 2014-01-22 (×8): 1000 mL
  Filled 2014-01-15 (×16): qty 1000

## 2014-01-15 MED ORDER — PIVOT 1.5 CAL PO LIQD: 1000.0000 mL | ORAL | Status: DC
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
## 2014-01-15 NOTE — Progress Notes (Signed)
Patient ID: Victor [name], male   DOB: 1990/08/28, 23 y.o.   MRN: [id number] Neuro unchanged. Wounds dry
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
## 2014-01-15 NOTE — Anesthesia Postprocedure Evaluation (Signed)
  Anesthesia Post-op Note  Patient: Victor [name]  Procedure(s) Performed: Procedure(s): LEFT OPEN REDUCTION INTERNAL FIXATION (ORIF) MAXILLARY MANDIBULAR FIXATION (Left)  Patient Location: PACU and ICU  Anesthesia Type:General  Level of Consciousness: Patient remains intubated per anesthesia plan  Airway and Oxygen Therapy: Patient remains intubated per anesthesia plan and Patient placed on Ventilator (see vital sign flow sheet for setting)  Post-op Pain: mild  Post-op Assessment: Post-op Vital signs reviewed  Post-op Vital Signs: Reviewed  Last Vitals:  Filed Vitals:   01/15/14 0306  BP: 118/68  Pulse: 80  Temp: 37.4 C  Resp: 19    Complications: No apparent anesthesia complications
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
## 2014-01-15 NOTE — Progress Notes (Signed)
Trauma Service Note  Subjective: Patient not following commands for me this AM.  Not storming.  Tube feedings to be restarted.  Objective: Vital signs in last 24 hours: Temp:  [96 F (35.6 C)-99.4 F (37.4 C)] 98.9 F (37.2 C) (06/24 0807) Pulse Rate:  [77-102] 96 (06/24 0807) Resp:  [15-23] 19 (06/24 0807) BP: (118-154)/(65-105) 123/74 mmHg (06/24 0807) SpO2:  [99 %-100 %] 100 % (06/24 0807) FiO2 (%):  [28 %] 28 % (06/24 0807) Last BM Date: 01/14/14  Intake/Output from previous day: 06/23 0701 - 06/24 0700 In: 1700 [I.V.:1300; IV Piggyback:400] Out: 900 [Urine:900] Intake/Output this shift: Total I/O In: -  Out: 250 [Urine:250]  General: No acute distress.  Not responding  Lungs: Clear to auscultation  Abd: Soft, nontender, flat  Extremities: No changes  Neuro: GCS 7-8 (V-1, E-3, M-3/4)  Lab Results: CBC   Recent Labs  01/14/14 0240 01/15/14 0240  WBC 13.8* 12.3*  HGB 9.5* 9.8*  HCT 31.4* 32.3*  PLT 684* 761*   BMET  Recent Labs  01/13/14 0210  NA 139  K 4.3  CL 98  CO2 28  GLUCOSE 103*  BUN 18  CREATININE 0.71  CALCIUM 9.5   PT/INR No results found for this basename: LABPROT, INR,  in the last 72 hours ABG No results found for this basename: PHART, PCO2, PO2, HCO3,  in the last 72 hours  Studies/Results: No results found.  Anti-infectives: Anti-infectives   Start     Dose/Rate Route Frequency Ordered Stop   01/13/14 1100  ciprofloxacin (CIPRO) IVPB 400 mg     400 mg 200 mL/hr over 60 Minutes Intravenous Every 12 hours 01/13/14 1054     01/11/14 2200  vancomycin (VANCOCIN) IVPB 1000 mg/200 mL premix  Status:  Discontinued     1,000 mg 200 mL/hr over 60 Minutes Intravenous Every 6 hours 01/11/14 2121 01/13/14 1054   01/10/14 2000  vancomycin (VANCOCIN) IVPB 1000 mg/200 mL premix  Status:  Discontinued     1,000 mg 200 mL/hr over 60 Minutes Intravenous Every 8 hours 01/10/14 0954 01/11/14 2121   01/10/14 1100  vancomycin (VANCOCIN)  2,000 mg in sodium chloride 0.9 % 500 mL IVPB     2,000 mg 250 mL/hr over 120 Minutes Intravenous  Once 01/10/14 0950 01/10/14 1517   01/10/14 0930  piperacillin-tazobactam (ZOSYN) IVPB 3.375 g  Status:  Discontinued     3.375 g 12.5 mL/hr over 240 Minutes Intravenous Every 8 hours 01/10/14 0842 01/13/14 1054   01/07/14 1000  ceFAZolin (ANCEF) IVPB 2 g/50 mL premix     2 g 100 mL/hr over 30 Minutes Intravenous 60 min pre-op 01/06/14 1109 01/07/14 1026   01/02/14 0800  [MAR Hold]  ceFAZolin (ANCEF) IVPB 2 g/50 mL premix     (On MAR Hold since 01/02/14 0819)   2 g 100 mL/hr over 30 Minutes Intravenous  Once 01/01/14 0935 01/02/14 0825   12/31/13 0800  ceFAZolin (ANCEF) IVPB 2 g/50 mL premix     2 g 100 mL/hr over 30 Minutes Intravenous 3 times per day 12/31/13 0729 12/31/13 1656      Assessment/Plan: s/p Procedure(s): LEFT OPEN REDUCTION INTERNAL FIXATION (ORIF) MAXILLARY MANDIBULAR FIXATION Advance diet Restart tube feedings. Being considered for inpatient Rehab  LOS: 15 days   Marta Lamas. Gae Bon, MD, FACS [phone] Trauma Surgeon 01/15/2014
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
## 2014-01-16 ENCOUNTER — Inpatient Hospital Stay (HOSPITAL_COMMUNITY): Payer: Medicaid Other

## 2014-01-16 ENCOUNTER — Encounter (HOSPITAL_COMMUNITY): Payer: Self-pay | Admitting: Oral and Maxillofacial Surgery

## 2014-01-16 DIAGNOSIS — R197 Diarrhea, unspecified: Secondary | ICD-10-CM

## 2014-01-16 LAB — CULTURE, BLOOD (ROUTINE X 2): CULTURE: NO GROWTH

## 2014-01-16 LAB — CLOSTRIDIUM DIFFICILE BY PCR: CDIFFPCR: NEGATIVE

## 2014-01-16 NOTE — Progress Notes (Signed)
Rehab admissions - We are following pt's case in follow up to rehab MD consult. Per rehab MD, pt is not demonstrating the ability to participate in our rehab program at this time.  Per latest therapy note on 01-15-14: "Pt not following commands today. He demonstrates behaviors in [place] level II with some emerging level III behaviors. Question if meds or surgery yesterday may be impacting function this date."  We will continue to follow pt's progress and consider possible inpatient stay when pt consistently demonstrates behaviors of Rancho level III to IV.  Thank you.  Juliann Mule, PT Rehabilitation Admissions Coordinator [phone]
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
## 2014-01-16 NOTE — Significant Event (Signed)
Found patient with what appeared to be sputum and tube feeding coming out of mouth and trach. Stopped tube feed, elevated HOB from 30 to 60. Suctioned mouth and trach. Lung sounds more congested and coarse than before. Gave 4mg  of Zofran IV. RT in room, reinvlated cuff, 02 Sats stable 95-100% on 28% 5 L. Spoke with Dr Janee Morn, Ordered to hold tube feeds and obtain chest xray.

## 2014-01-17 ENCOUNTER — Inpatient Hospital Stay (HOSPITAL_COMMUNITY): Payer: Medicaid Other

## 2014-01-17 DIAGNOSIS — J690 Pneumonitis due to inhalation of food and vomit: Secondary | ICD-10-CM

## 2014-01-17 LAB — BASIC METABOLIC PANEL
BUN: 22 mg/dL (ref 6–23)
CALCIUM: 9.5 mg/dL (ref 8.4–10.5)
CO2: 25 mEq/L (ref 19–32)
Chloride: 102 mEq/L (ref 96–112)
Creatinine, Ser: 0.72 mg/dL (ref 0.50–1.35)
Glucose, Bld: 117 mg/dL — ABNORMAL HIGH (ref 70–99)
Potassium: 4.4 mEq/L (ref 3.7–5.3)
SODIUM: 143 meq/L (ref 137–147)

## 2014-01-17 LAB — CBC
HCT: 38.5 % — ABNORMAL LOW (ref 39.0–52.0)
Hemoglobin: 11.5 g/dL — ABNORMAL LOW (ref 13.0–17.0)
MCH: 22.3 pg — AB (ref 26.0–34.0)
MCHC: 29.9 g/dL — AB (ref 30.0–36.0)
MCV: 74.6 fL — ABNORMAL LOW (ref 78.0–100.0)
PLATELETS: 853 10*3/uL — AB (ref 150–400)
RBC: 5.16 MIL/uL (ref 4.22–5.81)
RDW: 17.1 % — ABNORMAL HIGH (ref 11.5–15.5)
WBC: 14.4 10*3/uL — ABNORMAL HIGH (ref 4.0–10.5)

## 2014-01-17 LAB — GLUCOSE, CAPILLARY
Glucose-Capillary: 102 mg/dL — ABNORMAL HIGH (ref 70–99)
Glucose-Capillary: 92 mg/dL (ref 70–99)

## 2014-01-17 MED ORDER — DEXTROSE 5 % IV SOLN
1.0000 g | INTRAVENOUS | Status: DC
  Administered 2014-01-17 – 2014-01-19 (×3): 1 g via INTRAVENOUS
  Filled 2014-01-17 (×4): qty 10
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
## 2014-01-17 NOTE — Clinical Social Work Note (Signed)
Clinical Social Worker continuing to follow patient and family for support and discharge planning needs.  CSW was able to obtain a 90 PASARR for patient once placed at SNF.  CSW has submitted referral and updated clinical information to all facilities that accept Medicaid only within 50 mile radius of [place].  CSW awaits return call from 3 facilities regarding possible placement options.  CSW to follow up with hopeful accepting facilities on Monday for possible placement next week.  CSW remains available for support and to facilitate patient discharge needs once medically stable and bed available.  Macario Golds, [place] [phone]

## 2014-01-17 NOTE — Progress Notes (Signed)
Obtained sputum sample and sent to lab

## 2014-01-17 NOTE — Progress Notes (Signed)
Trauma Service Note  Subjective: Patient seems comfortable.  Not a lot of vomitus recorded.  Tube feedings are off.  Lots of diarrhea.  Objective: Vital signs in last 24 hours: Temp:  [97.7 F (36.5 C)-99.7 F (37.6 C)] 99.7 F (37.6 C) (06/26 0736) Pulse Rate:  [92-111] 98 (06/26 0434) Resp:  [19-33] 25 (06/26 0736) BP: (112-139)/(66-94) 112/78 mmHg (06/26 0736) SpO2:  [100 %] 100 % (06/26 0736) FiO2 (%):  [28 %] 28 % (06/26 0736) Last BM Date: 01/17/14  Intake/Output from previous day: 06/25 0701 - 06/26 0700 In: 1007.5 [I.V.:380; NG/GT:537.5] Out: 1825 [Urine:1825] Intake/Output this shift:    General: No distress but not following commands  Lungs: Clear  Abd: Soft, good bowel sounds.  Extremities: No clinical signs or symptoms of DVT  Neuro: Somnolent  Lab Results: CBC   Recent Labs  01/15/14 0240 01/17/14 0015  WBC 12.3* 14.4*  HGB 9.8* 11.5*  HCT 32.3* 38.5*  PLT 761* 853*   BMET  Recent Labs  01/17/14 0015  NA 143  K 4.4  CL 102  CO2 25  GLUCOSE 117*  BUN 22  CREATININE 0.72  CALCIUM 9.5   PT/INR No results found for this basename: LABPROT, INR,  in the last 72 hours ABG No results found for this basename: PHART, PCO2, PO2, HCO3,  in the last 72 hours  Studies/Results: Ct Head Wo Contrast  01/16/2014   CLINICAL DATA:  Followup subdural hematoma  EXAM: CT HEAD WITHOUT CONTRAST  TECHNIQUE: Contiguous axial images were obtained from the base of the skull through the vertex without intravenous contrast.  COMPARISON:  01/10/2014.  FINDINGS: Stable postoperative changes with a large craniotomy defect. The ventricles are in the midline without mass effect or shift. No new/acute intracranial abnormality. The gray-white differentiation is maintained. The brainstem and cerebellum are grossly normal.  IMPRESSION: Stable surgical changes with a large right craniotomy defect.  No mass effect or shift of the midline structures and no new/acute intracranial  findings.   Electronically Signed   By: Loralie Champagne M.D.   On: 01/16/2014 16:49   Dg Chest Port 1 View  01/17/2014   CLINICAL DATA:  Possible aspiration  EXAM: PORTABLE CHEST - 1 VIEW  COMPARISON:  01/11/2014  FINDINGS: There has been interval removal of the right-sided PICC line. The tracheostomy tube is in satisfactory position. There are low lung volumes. There is no focal parenchymal opacity, pleural effusion, or pneumothorax. The heart and mediastinal contours are unremarkable.  The osseous structures are unremarkable.  IMPRESSION: No active disease.   Electronically Signed   By: Elige Ko   On: 01/17/2014 08:13   Dg Tibia/fibula Left Port  01/16/2014   CLINICAL DATA:  Tibial fracture, followup  EXAM: PORTABLE LEFT TIBIA AND FIBULA - 2 VIEW  COMPARISON:  Portable exam 0836 hr compared to 01/02/2014  FINDINGS: Intra medullary nail with proximal and distal locking screws identified within LEFT tibia post ORIF of a comminuted proximal tibial diaphyseal fracture.  Mild lateral and posterior offset at the fracture is unchanged.  Tibial appears intact.  Knee and ankle joint alignments normal.  IMPRESSION: Post nailing of LEFT tibial diaphyseal fracture.   Electronically Signed   By: Ulyses Southward M.D.   On: 01/16/2014 08:53    Anti-infectives: Anti-infectives   Start     Dose/Rate Route Frequency Ordered Stop   01/13/14 1100  ciprofloxacin (CIPRO) IVPB 400 mg     400 mg 200 mL/hr over 60 Minutes Intravenous Every  12 hours 01/13/14 1054     01/11/14 2200  vancomycin (VANCOCIN) IVPB 1000 mg/200 mL premix  Status:  Discontinued     1,000 mg 200 mL/hr over 60 Minutes Intravenous Every 6 hours 01/11/14 2121 01/13/14 1054   01/10/14 2000  vancomycin (VANCOCIN) IVPB 1000 mg/200 mL premix  Status:  Discontinued     1,000 mg 200 mL/hr over 60 Minutes Intravenous Every 8 hours 01/10/14 0954 01/11/14 2121   01/10/14 1100  vancomycin (VANCOCIN) 2,000 mg in sodium chloride 0.9 % 500 mL IVPB     2,000  mg 250 mL/hr over 120 Minutes Intravenous  Once 01/10/14 0950 01/10/14 1517   01/10/14 0930  piperacillin-tazobactam (ZOSYN) IVPB 3.375 g  Status:  Discontinued     3.375 g 12.5 mL/hr over 240 Minutes Intravenous Every 8 hours 01/10/14 0842 01/13/14 1054   01/07/14 1000  ceFAZolin (ANCEF) IVPB 2 g/50 mL premix     2 g 100 mL/hr over 30 Minutes Intravenous 60 min pre-op 01/06/14 1109 01/07/14 1026   01/02/14 0800  [MAR Hold]  ceFAZolin (ANCEF) IVPB 2 g/50 mL premix     (On MAR Hold since 01/02/14 0819)   2 g 100 mL/hr over 30 Minutes Intravenous  Once 01/01/14 0935 01/02/14 0825   12/31/13 0800  ceFAZolin (ANCEF) IVPB 2 g/50 mL premix     2 g 100 mL/hr over 30 Minutes Intravenous 3 times per day 12/31/13 0729 12/31/13 1656      Assessment/Plan: s/p Procedure(s): LEFT OPEN REDUCTION INTERNAL FIXATION (ORIF) MAXILLARY MANDIBULAR FIXATION Advance diet Restart tube feedings Elevated WBC with low grade fever after possible aspiration. Will start Ceftriaxone empirically.  LOS: 17 days   Marta LamasJames O. Gae BonWyatt, III, MD, FACS [phone] Trauma Surgeon 01/17/2014

## 2014-01-17 NOTE — Progress Notes (Signed)
UR completed. CSW working diligently to find placement for pt.   Carlyle Lipa, RN BSN MHA CCM Trauma/Neuro ICU Case Manager [phone]
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
## 2014-01-18 DIAGNOSIS — S069XAA Unspecified intracranial injury with loss of consciousness status unknown, initial encounter: Secondary | ICD-10-CM

## 2014-01-18 DIAGNOSIS — D62 Acute posthemorrhagic anemia: Secondary | ICD-10-CM

## 2014-01-18 DIAGNOSIS — S069X9A Unspecified intracranial injury with loss of consciousness of unspecified duration, initial encounter: Secondary | ICD-10-CM

## 2014-01-18 LAB — GLUCOSE, CAPILLARY
GLUCOSE-CAPILLARY: 95 mg/dL (ref 70–99)
GLUCOSE-CAPILLARY: 102 mg/dL — AB (ref 70–99)
GLUCOSE-CAPILLARY: 116 mg/dL — AB (ref 70–99)
Glucose-Capillary: 106 mg/dL — ABNORMAL HIGH (ref 70–99)
Glucose-Capillary: 90 mg/dL (ref 70–99)
Glucose-Capillary: 100 mg/dL — ABNORMAL HIGH (ref 70–99)

## 2014-01-18 LAB — CBC WITH DIFFERENTIAL/PLATELET
BASOS ABS: 0 10*3/uL (ref 0.0–0.1)
BASOS PCT: 0 % (ref 0–1)
Eosinophils Absolute: 0.9 10*3/uL — ABNORMAL HIGH (ref 0.0–0.7)
Eosinophils Relative: 8 % — ABNORMAL HIGH (ref 0–5)
HCT: 35.3 % — ABNORMAL LOW (ref 39.0–52.0)
HEMOGLOBIN: 10.5 g/dL — AB (ref 13.0–17.0)
LYMPHS ABS: 2.2 10*3/uL (ref 0.7–4.0)
Lymphocytes Relative: 19 % (ref 12–46)
MCH: 21.9 pg — ABNORMAL LOW (ref 26.0–34.0)
MCHC: 29.7 g/dL — ABNORMAL LOW (ref 30.0–36.0)
MCV: 73.7 fL — AB (ref 78.0–100.0)
MONOS PCT: 10 % (ref 3–12)
Monocytes Absolute: 1.2 10*3/uL — ABNORMAL HIGH (ref 0.1–1.0)
Neutro Abs: 7.3 10*3/uL (ref 1.7–7.7)
Neutrophils Relative %: 63 % (ref 43–77)
Platelets: 626 10*3/uL — ABNORMAL HIGH (ref 150–400)
RBC: 4.79 MIL/uL (ref 4.22–5.81)
RDW: 17.1 % — AB (ref 11.5–15.5)
WBC: 11.6 10*3/uL — ABNORMAL HIGH (ref 4.0–10.5)
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
## 2014-01-18 NOTE — Progress Notes (Signed)
Pt seen and examined Agree with PA note  Mary Sella. Andrey Campanile, MD, FACS General, Bariatric, & Minimally Invasive Surgery [facility], [place]
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
## 2014-01-19 LAB — GLUCOSE, CAPILLARY
GLUCOSE-CAPILLARY: 104 mg/dL — AB (ref 70–99)
GLUCOSE-CAPILLARY: 106 mg/dL — AB (ref 70–99)
GLUCOSE-CAPILLARY: 110 mg/dL — AB (ref 70–99)
Glucose-Capillary: 103 mg/dL — ABNORMAL HIGH (ref 70–99)
Glucose-Capillary: 105 mg/dL — ABNORMAL HIGH (ref 70–99)
Glucose-Capillary: 107 mg/dL — ABNORMAL HIGH (ref 70–99)
Glucose-Capillary: 120 mg/dL — ABNORMAL HIGH (ref 70–99)

## 2014-01-19 LAB — CBC
HEMATOCRIT: 34.8 % — AB (ref 39.0–52.0)
HEMOGLOBIN: 10.3 g/dL — AB (ref 13.0–17.0)
MCH: 22 pg — AB (ref 26.0–34.0)
MCHC: 29.6 g/dL — ABNORMAL LOW (ref 30.0–36.0)
MCV: 74.4 fL — AB (ref 78.0–100.0)
Platelets: 609 10*3/uL — ABNORMAL HIGH (ref 150–400)
RBC: 4.68 MIL/uL (ref 4.22–5.81)
RDW: 17.2 % — ABNORMAL HIGH (ref 11.5–15.5)
WBC: 11.1 10*3/uL — AB (ref 4.0–10.5)

## 2014-01-19 LAB — CULTURE, RESPIRATORY

## 2014-01-19 LAB — CULTURE, RESPIRATORY W GRAM STAIN: Special Requests: NORMAL

## 2014-01-19 NOTE — Progress Notes (Signed)
Wilmon Arms. Corliss Skains, MD, [facility]  General/ Trauma Surgery  01/19/2014 12:59 PM
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
## 2014-01-20 DIAGNOSIS — F209 Schizophrenia, unspecified: Secondary | ICD-10-CM | POA: Insufficient documentation

## 2014-01-20 DIAGNOSIS — S02609A Fracture of mandible, unspecified, initial encounter for closed fracture: Secondary | ICD-10-CM

## 2014-01-20 DIAGNOSIS — N390 Urinary tract infection, site not specified: Secondary | ICD-10-CM

## 2014-01-20 DIAGNOSIS — R45851 Suicidal ideations: Secondary | ICD-10-CM

## 2014-01-20 DIAGNOSIS — S27329A Contusion of lung, unspecified, initial encounter: Secondary | ICD-10-CM

## 2014-01-20 DIAGNOSIS — B952 Enterococcus as the cause of diseases classified elsewhere: Secondary | ICD-10-CM | POA: Diagnosis not present

## 2014-01-20 DIAGNOSIS — S0292XA Unspecified fracture of facial bones, initial encounter for closed fracture: Secondary | ICD-10-CM

## 2014-01-20 DIAGNOSIS — S2232XA Fracture of one rib, left side, initial encounter for closed fracture: Secondary | ICD-10-CM

## 2014-01-20 DIAGNOSIS — S82402A Unspecified fracture of shaft of left fibula, initial encounter for closed fracture: Secondary | ICD-10-CM

## 2014-01-20 DIAGNOSIS — S82202A Unspecified fracture of shaft of left tibia, initial encounter for closed fracture: Secondary | ICD-10-CM

## 2014-01-20 DIAGNOSIS — I10 Essential (primary) hypertension: Secondary | ICD-10-CM | POA: Diagnosis present

## 2014-01-20 DIAGNOSIS — D62 Acute posthemorrhagic anemia: Secondary | ICD-10-CM | POA: Diagnosis present

## 2014-01-20 LAB — GLUCOSE, CAPILLARY
Glucose-Capillary: 105 mg/dL — ABNORMAL HIGH (ref 70–99)
Glucose-Capillary: 95 mg/dL (ref 70–99)

## 2014-01-20 MED ORDER — BACITRACIN ZINC 500 UNIT/GM EX OINT
TOPICAL_OINTMENT | Freq: Two times a day (BID) | CUTANEOUS | Status: DC
  Administered 2014-01-20: 21:00:00 via TOPICAL
  Administered 2014-01-21: 1 via TOPICAL
  Administered 2014-01-21 – 2014-01-27 (×12): via TOPICAL
  Filled 2014-01-20: qty 28.35

## 2014-01-20 MED ORDER — QUETIAPINE FUMARATE 50 MG PO TABS
100.0000 mg | ORAL_TABLET | Freq: Two times a day (BID) | ORAL | Status: DC
  Administered 2014-01-20 – 2014-01-23 (×7): 100 mg
  Filled 2014-01-20 (×7): qty 1

## 2014-01-20 MED ORDER — CLONAZEPAM 0.5 MG PO TABS
0.5000 mg | ORAL_TABLET | Freq: Two times a day (BID) | ORAL | Status: DC
  Administered 2014-01-20 – 2014-01-23 (×6): 0.5 mg
  Filled 2014-01-20 (×6): qty 1

## 2014-01-20 MED ORDER — GLYCOPYRROLATE 1 MG PO TABS
2.0000 mg | ORAL_TABLET | Freq: Three times a day (TID) | ORAL | Status: DC
  Administered 2014-01-20 (×3): 2 mg
  Filled 2014-01-20 (×6): qty 2
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
## 2014-01-20 NOTE — Progress Notes (Signed)
Staples removed from right side of skull and right side of abdomen per MD orders. Pt tolerated procedure well, no s/s of acute distress noted.

## 2014-01-20 NOTE — Progress Notes (Signed)
Patient ID: Victor [name], male   DOB: 09/11/1990, 23 y.o.   MRN: [id number] Not f/c. Wounds dry
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
## 2014-01-20 NOTE — Clinical Social Work Note (Signed)
Clinical Social Worker continuing to follow patient and family for support and discharge planning needs.  CSW has contacted several SNF"s by phone to advocate for patient placement.  CSW awaiting return call from two facilities regarding possible admission.  [facility] number already obtained for 90 days.  CSW remains available for support and to facilitate patient discharge needs once medically stable and bed available.  Macario Golds, [place] [phone]

## 2014-01-20 NOTE — Progress Notes (Signed)
Backing off on sedation may be very helpful.  Will see if this improves responsiveness.  This patient has been seen and I agree with the findings and treatment plan.  Marta LamasJames O. Gae BonWyatt, III, MD, FACS [phone] (pager) [phone] (direct pager) Trauma Surgeon
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
## 2014-01-21 LAB — BASIC METABOLIC PANEL
BUN: 18 mg/dL (ref 6–23)
CALCIUM: 9.2 mg/dL (ref 8.4–10.5)
CO2: 29 mEq/L (ref 19–32)
Chloride: 102 mEq/L (ref 96–112)
Creatinine, Ser: 0.75 mg/dL (ref 0.50–1.35)
GFR calc Af Amer: >90 mL/min (ref >90)
GLUCOSE: 98 mg/dL (ref 70–99)
POTASSIUM: 4.2 meq/L (ref 3.7–5.3)
Sodium: 143 mEq/L (ref 137–147)

## 2014-01-21 LAB — CBC
HCT: 35.6 % — ABNORMAL LOW (ref 39.0–52.0)
HEMOGLOBIN: 10.6 g/dL — AB (ref 13.0–17.0)
MCH: 21.9 pg — AB (ref 26.0–34.0)
MCHC: 29.8 g/dL — AB (ref 30.0–36.0)
MCV: 73.4 fL — ABNORMAL LOW (ref 78.0–100.0)
Platelets: 609 10*3/uL — ABNORMAL HIGH (ref 150–400)
RBC: 4.85 MIL/uL (ref 4.22–5.81)
RDW: 17.1 % — ABNORMAL HIGH (ref 11.5–15.5)
WBC: 8.6 10*3/uL (ref 4.0–10.5)

## 2014-01-21 LAB — GLUCOSE, CAPILLARY
GLUCOSE-CAPILLARY: 103 mg/dL — AB (ref 70–99)
GLUCOSE-CAPILLARY: 102 mg/dL — AB (ref 70–99)
GLUCOSE-CAPILLARY: 113 mg/dL — AB (ref 70–99)
Glucose-Capillary: 102 mg/dL — ABNORMAL HIGH (ref 70–99)
Glucose-Capillary: 102 mg/dL — ABNORMAL HIGH (ref 70–99)
Glucose-Capillary: 113 mg/dL — ABNORMAL HIGH (ref 70–99)
Glucose-Capillary: 115 mg/dL — ABNORMAL HIGH (ref 70–99)

## 2014-01-21 NOTE — Progress Notes (Signed)
Rehab admissions - I am following pt's case and made note of yesterday's comments from OT/PT regarding DC recommendation:  Per PT: Continues to demonstrate behaivors consistant with Rancho level II eneralized response - Inconsistent, non-purposeful, nonspecific response to all stimuli. Responses may be delayed). Patient not performing any directly purposeful movements and is not following commands at this time. Rec SNF for D/C. If pt begins to demonstrate progression via Rancho levels, he may be more appropriate for CIR. Will continue to follow.  At this time he is not able to tolerate the intensity of our rehab program. I will continue to follow case. At this point, skilled nursing is most appropriate for pt.  Thanks.  Juliann Mule, PT Rehabilitation Admissions Coordinator [phone]

## 2014-01-21 NOTE — Progress Notes (Signed)
Trach sutures removed per order.  Pt tolerated well.  RN was at bedside for removal.  Site is in tact.  No complications noted.

## 2014-01-21 NOTE — Clinical Social Work Note (Signed)
Clinical Social Worker continuing to follow patient and family for support and discharge planning needs.  CSW has sent referral to all facilities in the area and surrounding counties with no available bed offers at this time.  CSW awaiting call back from local facility regarding possible bed offer.  CSW left message with patient mother at 16:41 to provide update and options for placement for patient at discharge.  CSW to update patient mother once call returned and will follow up with potential facility.  CSW remains available for support and to facilitate patient discharge needs once bed available.  Macario GoldsJesse Esabella Stockinger, [place] [phone]
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
## 2014-01-22 LAB — GLUCOSE, CAPILLARY
GLUCOSE-CAPILLARY: 102 mg/dL — AB (ref 70–99)
GLUCOSE-CAPILLARY: 119 mg/dL — AB (ref 70–99)
Glucose-Capillary: 111 mg/dL — ABNORMAL HIGH (ref 70–99)
Glucose-Capillary: 112 mg/dL — ABNORMAL HIGH (ref 70–99)
Glucose-Capillary: 105 mg/dL — ABNORMAL HIGH (ref 70–99)

## 2014-01-22 MED ORDER — VITAL AF 1.2 CAL PO LIQD
1000.0000 mL | ORAL | Status: DC
  Administered 2014-01-23 – 2014-01-27 (×5): 1000 mL
  Filled 2014-01-22 (×3): qty 1000
  Filled 2014-01-22 (×2): qty 237
  Filled 2014-01-22 (×13): qty 1000

## 2014-01-22 NOTE — Progress Notes (Signed)
UR completed.  Nahom Carfagno, RN BSN MHA CCM Trauma/Neuro ICU Case Manager [phone]  

## 2014-01-22 NOTE — Progress Notes (Signed)
Orthopedic Tech Progress Note Patient Details:  Victor [name] [birth year] [id number]  Ortho Devices Type of Ortho Device: Other (comment) Ortho Device/Splint Location: bilateral AFO Ortho Device/Splint Interventions: Ordered   Early Chars 01/22/2014, 10:48 AM
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
## 2014-01-22 NOTE — Progress Notes (Signed)
Trauma Service Note  Subjective: Patient doing much better today, Racho III.  Made some purposeful gestures.  Objective: Vital signs in last 24 hours: Temp:  [97 F (36.1 C)-98.7 F (37.1 C)] 97.5 F (36.4 C) (07/01 0807) Pulse Rate:  [83-95] 92 (07/01 0809) Resp:  [14-25] 15 (07/01 0809) BP: (91-131)/(68-81) 125/78 mmHg (07/01 0809) SpO2:  [91 %-100 %] 97 % (07/01 0809) FiO2 (%):  [21 %] 21 % (07/01 0809) Last BM Date: 01/22/14  Intake/Output from previous day: 06/30 0701 - 07/01 0700 In: 1725 [NG/GT:1725] Out: 1400 [Urine:1400] Intake/Output this shift: Total I/O In: 163.8 [NG/GT:163.8] Out: -   General: No acute distress  Lungs: Clear, will cough up some secretions.  Put tach cuff down.  Abd: Soft, nontender.  Extremities: No changes.  Will get boot for LE to prevent foot drop  Neuro: More purposeful.  Lab Results: CBC   Recent Labs  01/21/14 0240  WBC 8.6  HGB 10.6*  HCT 35.6*  PLT 609*   BMET  Recent Labs  01/21/14 0240  NA 143  K 4.2  CL 102  CO2 29  GLUCOSE 98  BUN 18  CREATININE 0.75  CALCIUM 9.2   PT/INR No results found for this basename: LABPROT, INR,  in the last 72 hours ABG No results found for this basename: PHART, PCO2, PO2, HCO3,  in the last 72 hours  Studies/Results: No results found.  Anti-infectives: Anti-infectives   Start     Dose/Rate Route Frequency Ordered Stop   01/17/14 1000  cefTRIAXone (ROCEPHIN) 1 g in dextrose 5 % 50 mL IVPB  Status:  Discontinued     1 g 100 mL/hr over 30 Minutes Intravenous Every 24 hours 01/17/14 0908 01/20/14 0920   01/13/14 1100  ciprofloxacin (CIPRO) IVPB 400 mg  Status:  Discontinued     400 mg 200 mL/hr over 60 Minutes Intravenous Every 12 hours 01/13/14 1054 01/20/14 0920   01/11/14 2200  vancomycin (VANCOCIN) IVPB 1000 mg/200 mL premix  Status:  Discontinued     1,000 mg 200 mL/hr over 60 Minutes Intravenous Every 6 hours 01/11/14 2121 01/13/14 1054   01/10/14 2000  vancomycin  (VANCOCIN) IVPB 1000 mg/200 mL premix  Status:  Discontinued     1,000 mg 200 mL/hr over 60 Minutes Intravenous Every 8 hours 01/10/14 0954 01/11/14 2121   01/10/14 1100  vancomycin (VANCOCIN) 2,000 mg in sodium chloride 0.9 % 500 mL IVPB     2,000 mg 250 mL/hr over 120 Minutes Intravenous  Once 01/10/14 0950 01/10/14 1517   01/10/14 0930  piperacillin-tazobactam (ZOSYN) IVPB 3.375 g  Status:  Discontinued     3.375 g 12.5 mL/hr over 240 Minutes Intravenous Every 8 hours 01/10/14 0842 01/13/14 1054   01/07/14 1000  ceFAZolin (ANCEF) IVPB 2 g/50 mL premix     2 g 100 mL/hr over 30 Minutes Intravenous 60 min pre-op 01/06/14 1109 01/07/14 1026   01/02/14 0800  [MAR Hold]  ceFAZolin (ANCEF) IVPB 2 g/50 mL premix     (On MAR Hold since 01/02/14 0819)   2 g 100 mL/hr over 30 Minutes Intravenous  Once 01/01/14 0935 01/02/14 0825   12/31/13 0800  ceFAZolin (ANCEF) IVPB 2 g/50 mL premix     2 g 100 mL/hr over 30 Minutes Intravenous 3 times per day 12/31/13 0729 12/31/13 1656      Assessment/Plan: s/p Procedure(s): LEFT OPEN REDUCTION INTERNAL FIXATION (ORIF) MAXILLARY MANDIBULAR FIXATION CPM.  Adjust medications as needed.  LOS: 22 days  Marta Lamas. Gae Bon, MD, FACS [phone] Trauma Surgeon 01/22/2014

## 2014-01-22 NOTE — Clinical Social Work Note (Signed)
Clinical Social Worker continuing to follow patient and family for support and discharge planning needs.  CSW spoke with CSW supervisor who states that patient now qualifies for difficult to place and has had updated information sent to [facility] and [facility].  CSW to further explore options with both facilities and will update patient family once phone call returned.  CSW remains available for support and to facilitate patient discharge needs once medically ready and bed available.  Macario Golds, [place] [phone]
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
## 2014-01-23 LAB — GLUCOSE, CAPILLARY
GLUCOSE-CAPILLARY: 130 mg/dL — AB (ref 70–99)
GLUCOSE-CAPILLARY: 108 mg/dL — AB (ref 70–99)
Glucose-Capillary: 101 mg/dL — ABNORMAL HIGH (ref 70–99)
Glucose-Capillary: 105 mg/dL — ABNORMAL HIGH (ref 70–99)
Glucose-Capillary: 109 mg/dL — ABNORMAL HIGH (ref 70–99)
Glucose-Capillary: 110 mg/dL — ABNORMAL HIGH (ref 70–99)

## 2014-01-23 MED ORDER — POLYETHYLENE GLYCOL 3350 17 G PO PACK: 17.0000 g | PACK | Freq: Every day | ORAL | Status: DC | PRN

## 2014-01-23 MED ORDER — CLONAZEPAM 1 MG PO TABS
1.0000 mg | ORAL_TABLET | Freq: Every day | ORAL | Status: DC
  Administered 2014-01-24: 1 mg
  Filled 2014-01-23: qty 1

## 2014-01-23 NOTE — Progress Notes (Signed)
Patient ID: Victor [name], male   DOB: [birth year], 23 y.o.   MRN: [id number] No changes

## 2014-01-23 NOTE — Progress Notes (Signed)
Pt transferred to 4N15 via bed with all belongings. Mother called and updated. VSS. Dressings to abdomen and right leg changed before transfer as well as dressings around trach.

## 2014-01-23 NOTE — Progress Notes (Signed)
Trauma Service Note  Subjective: Patient starting to awaken and follow commands a bit more.  Following some commands during therapy.  Objective: Vital signs in last 24 hours: Temp:  [97 F (36.1 C)-99.2 F (37.3 C)] 98.4 F (36.9 C) (07/02 0349) Pulse Rate:  [85-116] 116 (07/02 0755) Resp:  [10-28] 28 (07/02 0755) BP: (109-137)/(64-78) 137/72 mmHg (07/02 0755) SpO2:  [96 %-99 %] 99 % (07/02 0755) FiO2 (%):  [21 %] 21 % (07/02 0754) Weight:  [89.1 kg (196 lb 6.9 oz)-90 kg (198 lb 6.6 oz)] 89.1 kg (196 lb 6.9 oz) (07/02 0500) Last BM Date: 01/22/14  Intake/Output from previous day: 07/01 0701 - 07/02 0700 In: 563.8 [NG/GT:563.8] Out: 1425 [Urine:1425] Intake/Output this shift:    General: No acute distress  Lungs: Clear.  Coughing up secretions well.  Abd: Soft, benign, tolerating Vital HN at 95cc/hr.  Diarrhea is a bit less today.  Extremities: No changes  Neuro: Opens eyes to verbal stimulus.  Purposeful movement of right hand.  Grabs with left hand.  Lab Results: CBC   Recent Labs  01/21/14 0240  WBC 8.6  HGB 10.6*  HCT 35.6*  PLT 609*   BMET  Recent Labs  01/21/14 0240  NA 143  K 4.2  CL 102  CO2 29  GLUCOSE 98  BUN 18  CREATININE 0.75  CALCIUM 9.2   PT/INR No results found for this basename: LABPROT, INR,  in the last 72 hours ABG No results found for this basename: PHART, PCO2, PO2, HCO3,  in the last 72 hours  Studies/Results: No results found.  Anti-infectives: Anti-infectives   Start     Dose/Rate Route Frequency Ordered Stop   01/17/14 1000  cefTRIAXone (ROCEPHIN) 1 g in dextrose 5 % 50 mL IVPB  Status:  Discontinued     1 g 100 mL/hr over 30 Minutes Intravenous Every 24 hours 01/17/14 0908 01/20/14 0920   01/13/14 1100  ciprofloxacin (CIPRO) IVPB 400 mg  Status:  Discontinued     400 mg 200 mL/hr over 60 Minutes Intravenous Every 12 hours 01/13/14 1054 01/20/14 0920   01/11/14 2200  vancomycin (VANCOCIN) IVPB 1000 mg/200 mL premix   Status:  Discontinued     1,000 mg 200 mL/hr over 60 Minutes Intravenous Every 6 hours 01/11/14 2121 01/13/14 1054   01/10/14 2000  vancomycin (VANCOCIN) IVPB 1000 mg/200 mL premix  Status:  Discontinued     1,000 mg 200 mL/hr over 60 Minutes Intravenous Every 8 hours 01/10/14 0954 01/11/14 2121   01/10/14 1100  vancomycin (VANCOCIN) 2,000 mg in sodium chloride 0.9 % 500 mL IVPB     2,000 mg 250 mL/hr over 120 Minutes Intravenous  Once 01/10/14 0950 01/10/14 1517   01/10/14 0930  piperacillin-tazobactam (ZOSYN) IVPB 3.375 g  Status:  Discontinued     3.375 g 12.5 mL/hr over 240 Minutes Intravenous Every 8 hours 01/10/14 0842 01/13/14 1054   01/07/14 1000  ceFAZolin (ANCEF) IVPB 2 g/50 mL premix     2 g 100 mL/hr over 30 Minutes Intravenous 60 min pre-op 01/06/14 1109 01/07/14 1026   01/02/14 0800  [MAR Hold]  ceFAZolin (ANCEF) IVPB 2 g/50 mL premix     (On MAR Hold since 01/02/14 0819)   2 g 100 mL/hr over 30 Minutes Intravenous  Once 01/01/14 0935 01/02/14 0825   12/31/13 0800  ceFAZolin (ANCEF) IVPB 2 g/50 mL premix     2 g 100 mL/hr over 30 Minutes Intravenous 3 times per day 12/31/13  0321 12/31/13 1656      Assessment/Plan: s/p Procedure(s): LEFT OPEN REDUCTION INTERNAL FIXATION (ORIF) MAXILLARY MANDIBULAR FIXATION Advance diet Transfer to neuro unit/4N  LOS: 23 days   Marta Lamas. Gae Bon, MD, FACS [phone] Trauma Surgeon 01/23/2014

## 2014-01-24 DIAGNOSIS — D72829 Elevated white blood cell count, unspecified: Secondary | ICD-10-CM

## 2014-01-24 LAB — GLUCOSE, CAPILLARY
GLUCOSE-CAPILLARY: 117 mg/dL — AB (ref 70–99)
GLUCOSE-CAPILLARY: 113 mg/dL — AB (ref 70–99)
Glucose-Capillary: 101 mg/dL — ABNORMAL HIGH (ref 70–99)
Glucose-Capillary: 112 mg/dL — ABNORMAL HIGH (ref 70–99)
Glucose-Capillary: 116 mg/dL — ABNORMAL HIGH (ref 70–99)
Glucose-Capillary: 117 mg/dL — ABNORMAL HIGH (ref 70–99)
Glucose-Capillary: 119 mg/dL — ABNORMAL HIGH (ref 70–99)

## 2014-01-24 LAB — BASIC METABOLIC PANEL
ANION GAP: 11 (ref 5–15)
BUN: 21 mg/dL (ref 6–23)
CO2: 28 mEq/L (ref 19–32)
Calcium: 9 mg/dL (ref 8.4–10.5)
Chloride: 103 mEq/L (ref 96–112)
Creatinine, Ser: 0.68 mg/dL (ref 0.50–1.35)
Glucose, Bld: 107 mg/dL — ABNORMAL HIGH (ref 70–99)
Potassium: 4.4 mEq/L (ref 3.7–5.3)
SODIUM: 142 meq/L (ref 137–147)

## 2014-01-24 LAB — CBC WITH DIFFERENTIAL/PLATELET
BASOS ABS: 0.1 10*3/uL (ref 0.0–0.1)
BASOS PCT: 1 % (ref 0–1)
EOS ABS: 0.9 10*3/uL — AB (ref 0.0–0.7)
Eosinophils Relative: 7 % — ABNORMAL HIGH (ref 0–5)
HCT: 41.4 % (ref 39.0–52.0)
HEMOGLOBIN: 12.4 g/dL — AB (ref 13.0–17.0)
Lymphocytes Relative: 19 % (ref 12–46)
Lymphs Abs: 2.5 10*3/uL (ref 0.7–4.0)
MCH: 22.2 pg — AB (ref 26.0–34.0)
MCHC: 30 g/dL (ref 30.0–36.0)
MCV: 74.1 fL — ABNORMAL LOW (ref 78.0–100.0)
MONO ABS: 0.9 10*3/uL (ref 0.1–1.0)
Monocytes Relative: 7 % (ref 3–12)
NEUTROS PCT: 66 % (ref 43–77)
Neutro Abs: 8.6 10*3/uL — ABNORMAL HIGH (ref 1.7–7.7)
Platelets: 550 10*3/uL — ABNORMAL HIGH (ref 150–400)
RBC: 5.59 MIL/uL (ref 4.22–5.81)
RDW: 17.5 % — AB (ref 11.5–15.5)
WBC: 13 10*3/uL — ABNORMAL HIGH (ref 4.0–10.5)

## 2014-01-24 LAB — CLOSTRIDIUM DIFFICILE BY PCR: CDIFFPCR: NEGATIVE

## 2014-01-24 MED ORDER — ACETAMINOPHEN 325 MG PO TABS: 650.0000 mg | ORAL_TABLET | Freq: Four times a day (QID) | ORAL | Status: DC | PRN

## 2014-01-24 MED ORDER — LEVETIRACETAM 100 MG/ML PO SOLN
500.0000 mg | Freq: Two times a day (BID) | ORAL | Status: DC
  Administered 2014-01-24: 500 mg via ORAL
  Filled 2014-01-24 (×2): qty 5

## 2014-01-24 MED ORDER — LEVETIRACETAM 100 MG/ML PO SOLN
500.0000 mg | Freq: Two times a day (BID) | ORAL | Status: DC
  Administered 2014-01-24 – 2014-01-27 (×6): 500 mg via ORAL
  Filled 2014-01-24 (×11): qty 5

## 2014-01-24 MED ORDER — QUETIAPINE FUMARATE 50 MG PO TABS
100.0000 mg | ORAL_TABLET | Freq: Every day | ORAL | Status: DC
  Administered 2014-01-24: 100 mg
  Filled 2014-01-24: qty 2
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
## 2014-01-24 NOTE — Progress Notes (Signed)
Still lots of secretions and having a lot of diarrhea.  C.diff negative.  Consider decreasing G-tube rate or changing formula.  This patient has been seen and I agree with the findings and treatment plan.  Marta Lamas. Gae Bon, MD, FACS [phone] (pager) [phone] (direct pager) Trauma Surgeon
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
## 2014-01-24 NOTE — Progress Notes (Signed)
Agitated and with lots of diarrhea..  Suctioned well.  Breathing okay.  This patient has been seen and I agree with the findings and treatment plan.  Marta Lamas. Gae Bon, MD, FACS [phone] (pager) [phone] (direct pager) Trauma Surgeon

## 2014-01-24 NOTE — Progress Notes (Signed)
Orthopedic Tech Progress Note Patient Details:  Victor EvangelistDave XXXShaw 10-07-1990 [id number]  Ortho Devices Type of Ortho Device: Abdominal binder Ortho Device/Splint Location: bilateral AFO Ortho Device/Splint Interventions: Ordered   Davida Falconi T 01/24/2014, 6:52 PM
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
## 2014-01-24 NOTE — Progress Notes (Signed)
Patient has an order for keppra, the writer has called pharmacy x5 times and each time the reply is it will be send in few minutes. Also called and spoke with Tim the pharmacist and said he will take care of it.

## 2014-01-24 NOTE — Progress Notes (Signed)
Patient ID: Victor [name], male   DOB: [birth year], 23 y.o.   MRN: [id number] Afeb, vss No new changes. Continue present rx.

## 2014-01-24 NOTE — Progress Notes (Signed)
Patient is still having loose stools, MD paged to see if patient can have a flexiseal, MD said its ok. Will continue to monitor.

## 2014-01-24 NOTE — Progress Notes (Signed)
Patient had two loose, watery stools during shift.

## 2014-01-24 NOTE — Clinical Social Work Note (Signed)
Clinical Social Worker continuing to follow patient and family for support and discharge planning needs.  CSW spoke with [facility] admissions coordinator who states that patient has been accepted by facility administrator and bed available.  CSW spoke with supervisor about bed availability and the need for medications, trach supplies, and tube feeds until Monday.  CSW contacted main pharmacy who states the hospital system is unable to release all medications for the weekend until pharmacy able to fill at facility.  Facility willing to use back up pharmacy on Saturday but will still not have access to trach supplies and tube feeds until Monday.  CSW spoke with facility who is agreeable to plan for transfer for Monday, January 27, 2014 - PA updated and aware.  Clinical Social Worker contacted patient mother over the phone to provide information regarding placement.  Patient mother verbalized her frustrations with patient leaving the hospital "so soon," however did agree for the discharge to take place.  Patient mother provided CSW with verbal permission to contact patient uncle to make arrangements to complete paperwork.  Patient uncle plans to be at the hospital on Monday at 10am to complete necessary paperwork for facility.  CSW to follow up with patient family on Monday prior to patient discharge.  CSW remains available for support and to facilitate patient discharge needs once medically ready and bed available.  Macario Golds, [place] [phone]

## 2014-01-24 NOTE — Progress Notes (Signed)
RT Note: Patient is not currently on continuous pulse ox. I spoke with RN yesterday and today about getting one, and we still don't have one. SAT stable during all spot checks, currently 98%. RT will continue to monitor.

## 2014-01-25 LAB — GLUCOSE, CAPILLARY
Glucose-Capillary: 94 mg/dL (ref 70–99)
Glucose-Capillary: 117 mg/dL — ABNORMAL HIGH (ref 70–99)

## 2014-01-25 LAB — CBC
HCT: 42.6 % (ref 39.0–52.0)
Hemoglobin: 13.1 g/dL (ref 13.0–17.0)
MCH: 22.5 pg — AB (ref 26.0–34.0)
MCHC: 30.8 g/dL (ref 30.0–36.0)
MCV: 73.3 fL — AB (ref 78.0–100.0)
Platelets: 359 10*3/uL (ref 150–400)
RBC: 5.81 MIL/uL (ref 4.22–5.81)
RDW: 17.9 % — AB (ref 11.5–15.5)
WBC: 11.6 10*3/uL — ABNORMAL HIGH (ref 4.0–10.5)

## 2014-01-25 MED ORDER — QUETIAPINE FUMARATE 50 MG PO TABS
50.0000 mg | ORAL_TABLET | Freq: Every day | ORAL | Status: DC
  Administered 2014-01-25: 50 mg
  Filled 2014-01-25: qty 1

## 2014-01-25 MED ORDER — CLONAZEPAM 0.5 MG PO TABS
0.5000 mg | ORAL_TABLET | Freq: Every day | ORAL | Status: DC
  Administered 2014-01-25: 0.5 mg
  Filled 2014-01-25: qty 1

## 2014-01-25 NOTE — Progress Notes (Signed)
Seen, agree with above. Awaiting SNF

## 2014-01-25 NOTE — Progress Notes (Signed)
Patient has had 3 large, loose, mucous/ watery bowel movements. Flexiseal placed per previous order. Patient tolerated well. Will continue to monitor

## 2014-01-25 NOTE — Progress Notes (Signed)
Patient ID: Victor [name], male   DOB: 1990-08-28, 23 y.o.   MRN: [id number] Resting, no new findings
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
## 2014-01-26 ENCOUNTER — Inpatient Hospital Stay (HOSPITAL_COMMUNITY): Payer: Medicaid Other

## 2014-01-26 LAB — GLUCOSE, CAPILLARY: Glucose-Capillary: 105 mg/dL — ABNORMAL HIGH (ref 70–99)

## 2014-01-26 NOTE — Progress Notes (Signed)
Patient ID: Victor [name], male   DOB: 08/19/1990, 23 y.o.   MRN: [id number] Neuro unchanged. To get a ct head for f/u

## 2014-01-26 NOTE — Progress Notes (Signed)
Placement is the difficulty now.  This patient has been seen and I agree with the findings and treatment plan.  Marta Lamas. Gae Bon, MD, FACS [phone] (pager) [phone] (direct pager) Trauma Surgeon
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
## 2014-01-26 NOTE — Progress Notes (Signed)
RT note: Suctioned moderate thick white/clear  Tracheal secretions via trach. Patient tolerated well vital signs stable.

## 2014-01-27 ENCOUNTER — Encounter (HOSPITAL_COMMUNITY): Payer: Self-pay | Admitting: Emergency Medicine

## 2014-01-27 ENCOUNTER — Encounter: Payer: Self-pay | Admitting: Internal Medicine

## 2014-01-27 LAB — BASIC METABOLIC PANEL
ANION GAP: 13 (ref 5–15)
BUN: 19 mg/dL (ref 6–23)
CHLORIDE: 106 meq/L (ref 96–112)
CO2: 25 mEq/L (ref 19–32)
CREATININE: 0.71 mg/dL (ref 0.50–1.35)
Calcium: 9.3 mg/dL (ref 8.4–10.5)
GFR calc non Af Amer: >90 mL/min (ref >90)
Glucose, Bld: 110 mg/dL — ABNORMAL HIGH (ref 70–99)
Potassium: 4.5 mEq/L (ref 3.7–5.3)
SODIUM: 144 meq/L (ref 137–147)

## 2014-01-27 LAB — CBC
HCT: 41.2 % (ref 39.0–52.0)
Hemoglobin: 12.9 g/dL — ABNORMAL LOW (ref 13.0–17.0)
MCH: 22.8 pg — ABNORMAL LOW (ref 26.0–34.0)
MCHC: 31.3 g/dL (ref 30.0–36.0)
MCV: 72.7 fL — ABNORMAL LOW (ref 78.0–100.0)
PLATELETS: 447 10*3/uL — AB (ref 150–400)
RBC: 5.67 MIL/uL (ref 4.22–5.81)
RDW: 18 % — ABNORMAL HIGH (ref 11.5–15.5)
WBC: 14.2 10*3/uL — AB (ref 4.0–10.5)

## 2014-01-27 MED ORDER — LEVETIRACETAM 100 MG/ML PO SOLN: 500.0000 mg | Freq: Two times a day (BID) | ORAL | Status: DC

## 2014-01-27 MED ORDER — FREE WATER: 200.0000 mL | Freq: Three times a day (TID) | Status: DC

## 2014-01-27 MED ORDER — PROPRANOLOL HCL 20 MG/5ML PO SOLN: 40.0000 mg | Freq: Four times a day (QID) | ORAL | Status: DC

## 2014-01-27 MED ORDER — VITAL AF 1.2 CAL PO LIQD: 1000.0000 mL | ORAL | Status: DC

## 2014-01-27 MED ORDER — HYDROCODONE-ACETAMINOPHEN 7.5-325 MG/15ML PO SOLN: 20.0000 mL | ORAL | Status: DC | PRN

## 2014-01-27 NOTE — Discharge Summary (Signed)
Victor Zeitlin, MD, MPH, FACS Trauma: [phone] General Surgery: [phone]  
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
## 2014-01-27 NOTE — Clinical Social Work Placement (Signed)
Clinical Social Work Department CLINICAL SOCIAL WORK PLACEMENT NOTE 01/27/2014  Patient:  Victor [name], Victor [name]  Account Number:  [id number] Admit date:  12/31/2013  Clinical Social Worker:  Macario Golds, LCSW  Date/time:  01/27/2014 12:01 PM  Clinical Social Work is seeking post-discharge placement for this patient at the following level of care:   SKILLED NURSING   (*CSW will update this form in Epic as items are completed)   01/15/2014  Patient/family provided with [facility] Department of Clinical Social Work's list of facilities offering this level of care within the geographic area requested by the patient (or if unable, by the patient's family).  01/15/2014  Patient/family informed of their freedom to choose among providers that offer the needed level of care, that participate in Medicare, Medicaid or managed care program needed by the patient, have an available bed and are willing to accept the patient.  01/15/2014  Patient/family informed of [facility]' ownership interest in [facility], as well as of the fact that they are under no obligation to receive care at this facility.  PASARR submitted to EDS on 01/16/2014 PASARR number received on 01/20/2014  FL2 transmitted to all facilities in geographic area requested by pt/family on  01/16/2014 FL2 transmitted to all facilities within larger geographic area on   Patient informed that his/her managed care company has contracts with or will negotiate with  certain facilities, including the following:     Patient/family informed of bed offers received:  01/22/2014 Patient chooses bed at [facility] Physician recommends and patient chooses bed at    Patient to be transferred to [facility] on  01/27/2014 Patient to be transferred to facility by Ambulance - [facility] Patient and family notified of transfer on 01/27/2014 Name of family member notified:  Ginnie Smart (Uncle) / Alleen Borne (Mother)  The following physician  request were entered in Epic:   Additional Comments:
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
## 2014-01-27 NOTE — Progress Notes (Signed)
Eyes are open but does not track. Purposeful with RUE but did not F/C for me. To SNF today. I spoke with his uncle. Patient examined and I agree with the assessment and plan  Violeta Gelinas, MD, MPH, FACS Trauma: [phone] General Surgery: [phone]  01/27/2014 11:39 AM

## 2014-01-27 NOTE — Clinical Social Work Note (Signed)
Clinical Social Worker facilitated patient discharge including contacting patient family and facility to confirm patient discharge plans.  Clinical information faxed to facility and family agreeable with plan.  Admission paperwork completed by patient uncle, Ginnie Smart with verbal permission from patient mother to complete.  CSW arranged ambulance transport via [facility] to [facility]	[place].  RN to call report prior to discharge.  Clinical Social Worker will sign off for now as social work intervention is no longer needed. Please consult [place] again if new need arises.  Macario Golds, [place] [phone]

## 2014-01-27 NOTE — Progress Notes (Signed)
Rehab admissions - We are signing off pt's case in light of today's DC to [facility].  Juliann MuleJanine Sarh Kirschenbaum, PT Rehabilitation Admissions Coordinator [phone]
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
## 2014-01-27 NOTE — Progress Notes (Signed)
Patient ID: Victor [name], male   DOB: 10-03-1990, 23 y.o.   MRN: [id number] Neuro unchanged. Ct head shows evolving traumatic lesion. Waiting for placement

## 2014-01-27 NOTE — Progress Notes (Signed)
Report given to [facility].  

## 2014-02-10 LAB — AEROBIC CULTURE

## 2014-02-22 ENCOUNTER — Encounter: Payer: Self-pay | Admitting: Internal Medicine

## 2014-02-22 DEATH — deceased

## 2014-03-06 ENCOUNTER — Ambulatory Visit: Payer: Self-pay

## 2014-03-06 ENCOUNTER — Ambulatory Visit: Payer: Self-pay | Admitting: Neurosurgery

## 2014-03-08 ENCOUNTER — Emergency Department: Payer: Self-pay | Admitting: Student

## 2014-03-10 ENCOUNTER — Telehealth (INDEPENDENT_AMBULATORY_CARE_PROVIDER_SITE_OTHER): Payer: Self-pay
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
## 2014-03-11 ENCOUNTER — Telehealth (HOSPITAL_COMMUNITY): Payer: Self-pay

## 2014-03-12 NOTE — Telephone Encounter (Signed)
No answer, unable to leave message. Ok to leave foley in if they call back.

## 2014-03-13 NOTE — Telephone Encounter (Signed)
Elizabeth at the [facility] notified of Dr Carollee Massed response re: foley. She states understanding and will be sure order is placed on chart for Victor [name].

## 2014-03-13 NOTE — Telephone Encounter (Signed)
It is fine to leave the foley and use that. It is also OK at this point to replace the foley if needed without going to the ED. Please let them know. Thanks.

## 2014-03-24 ENCOUNTER — Encounter: Payer: Self-pay | Admitting: *Deleted
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
## 2014-03-25 ENCOUNTER — Encounter: Payer: Self-pay | Admitting: Internal Medicine

## 2014-03-26 ENCOUNTER — Inpatient Hospital Stay (HOSPITAL_COMMUNITY)
Admission: RE | Admit: 2014-03-26 | Discharge: 2014-04-25 | DRG: 949 | Disposition: A | Discharge status: Skilled Nursing Facility | Payer: Medicaid Other | Source: Skilled Nursing Facility | Attending: Physical Medicine & Rehabilitation | Admitting: Physical Medicine & Rehabilitation

## 2014-03-26 DIAGNOSIS — R1314 Dysphagia, pharyngoesophageal phase: Secondary | ICD-10-CM | POA: Diagnosis not present

## 2014-03-26 DIAGNOSIS — R45851 Suicidal ideations: Secondary | ICD-10-CM | POA: Diagnosis not present

## 2014-03-26 DIAGNOSIS — I10 Essential (primary) hypertension: Secondary | ICD-10-CM | POA: Diagnosis not present

## 2014-03-26 DIAGNOSIS — F259 Schizoaffective disorder, unspecified: Secondary | ICD-10-CM | POA: Diagnosis not present

## 2014-03-26 DIAGNOSIS — S82202D Unspecified fracture of shaft of left tibia, subsequent encounter for closed fracture with routine healing: Secondary | ICD-10-CM

## 2014-03-26 DIAGNOSIS — S065X4S Traumatic subdural hemorrhage with loss of consciousness of 6 hours to 24 hours, sequela: Secondary | ICD-10-CM

## 2014-03-26 DIAGNOSIS — Z79899 Other long term (current) drug therapy: Secondary | ICD-10-CM

## 2014-03-26 DIAGNOSIS — S0292XS Unspecified fracture of facial bones, sequela: Secondary | ICD-10-CM

## 2014-03-26 DIAGNOSIS — S82202A Unspecified fracture of shaft of left tibia, initial encounter for closed fracture: Secondary | ICD-10-CM | POA: Diagnosis present

## 2014-03-26 DIAGNOSIS — Z930 Tracheostomy status: Secondary | ICD-10-CM | POA: Diagnosis not present

## 2014-03-26 DIAGNOSIS — S065X9D Traumatic subdural hemorrhage with loss of consciousness of unspecified duration, subsequent encounter: Principal | ICD-10-CM

## 2014-03-26 DIAGNOSIS — F17210 Nicotine dependence, cigarettes, uncomplicated: Secondary | ICD-10-CM

## 2014-03-26 DIAGNOSIS — Z931 Gastrostomy status: Secondary | ICD-10-CM

## 2014-03-26 DIAGNOSIS — S82202S Unspecified fracture of shaft of left tibia, sequela: Secondary | ICD-10-CM

## 2014-03-26 DIAGNOSIS — E119 Type 2 diabetes mellitus without complications: Secondary | ICD-10-CM | POA: Diagnosis not present

## 2014-03-26 DIAGNOSIS — S069X4S Unspecified intracranial injury with loss of consciousness of 6 hours to 24 hours, sequela: Secondary | ICD-10-CM

## 2014-03-26 DIAGNOSIS — S069X9A Unspecified intracranial injury with loss of consciousness of unspecified duration, initial encounter: Secondary | ICD-10-CM | POA: Diagnosis present

## 2014-03-26 DIAGNOSIS — S82402S Unspecified fracture of shaft of left fibula, sequela: Secondary | ICD-10-CM

## 2014-03-26 DIAGNOSIS — G5791 Unspecified mononeuropathy of right lower limb: Secondary | ICD-10-CM

## 2014-03-26 DIAGNOSIS — S12600D Unspecified displaced fracture of seventh cervical vertebra, subsequent encounter for fracture with routine healing: Secondary | ICD-10-CM

## 2014-03-26 DIAGNOSIS — S02609A Fracture of mandible, unspecified, initial encounter for closed fracture: Secondary | ICD-10-CM | POA: Diagnosis present

## 2014-03-26 DIAGNOSIS — S82402A Unspecified fracture of shaft of left fibula, initial encounter for closed fracture: Secondary | ICD-10-CM

## 2014-03-26 DIAGNOSIS — F200 Paranoid schizophrenia: Secondary | ICD-10-CM

## 2014-03-26 DIAGNOSIS — S0264XD Fracture of ramus of mandible, subsequent encounter for fracture with routine healing: Secondary | ICD-10-CM

## 2014-03-26 DIAGNOSIS — S27329S Contusion of lung, unspecified, sequela: Secondary | ICD-10-CM

## 2014-03-26 DIAGNOSIS — S069XAA Unspecified intracranial injury with loss of consciousness status unknown, initial encounter: Secondary | ICD-10-CM | POA: Diagnosis present

## 2014-03-26 DIAGNOSIS — S2232XD Fracture of one rib, left side, subsequent encounter for fracture with routine healing: Secondary | ICD-10-CM

## 2014-03-26 DIAGNOSIS — W19XXXA Unspecified fall, initial encounter: Secondary | ICD-10-CM

## 2014-03-26 LAB — MRSA PCR SCREENING: MRSA by PCR: POSITIVE — AB

## 2014-03-26 MED ORDER — JEVITY 1.2 CAL PO LIQD
95.0000 mL | ORAL | Status: DC
  Administered 2014-03-26: 95 mL/h
  Filled 2014-03-26 (×2): qty 237
  Filled 2014-03-26: qty 1000

## 2014-03-26 MED ORDER — SORBITOL 70 % SOLN: 30.0000 mL | Freq: Every day | Status: DC | PRN

## 2014-03-26 MED ORDER — ESCITALOPRAM OXALATE 10 MG PO TABS
10.0000 mg | ORAL_TABLET | Freq: Every day | ORAL | Status: DC
  Administered 2014-03-27 – 2014-04-25 (×30): 10 mg
  Filled 2014-03-26 (×35): qty 1

## 2014-03-26 MED ORDER — POLYETHYLENE GLYCOL 3350 17 G PO PACK
17.0000 g | PACK | Freq: Every day | ORAL | Status: DC
  Administered 2014-03-26 – 2014-04-25 (×29): 17 g
  Filled 2014-03-26 (×34): qty 1

## 2014-03-26 MED ORDER — ACETAMINOPHEN 325 MG PO TABS
325.0000 mg | ORAL_TABLET | ORAL | Status: DC | PRN
  Administered 2014-03-28: 650 mg
  Administered 2014-04-05: 325 mg
  Administered 2014-04-06 – 2014-04-14 (×2): 650 mg
  Filled 2014-03-26 (×4): qty 2

## 2014-03-26 MED ORDER — LORAZEPAM 0.5 MG PO TABS: 0.5000 mg | ORAL_TABLET | ORAL | Status: DC | PRN

## 2014-03-26 MED ORDER — HYDROCODONE-ACETAMINOPHEN 5-325 MG PO TABS
1.0000 | ORAL_TABLET | ORAL | Status: DC | PRN
  Administered 2014-04-01 – 2014-04-23 (×6): 1
  Filled 2014-03-26 (×6): qty 1

## 2014-03-26 MED ORDER — PROPRANOLOL HCL 20 MG/5ML PO SOLN
20.0000 mg | Freq: Four times a day (QID) | ORAL | Status: DC
  Administered 2014-03-26 – 2014-03-27 (×5): 20 mg
  Filled 2014-03-26 (×8): qty 5

## 2014-03-26 MED ORDER — LEVETIRACETAM 100 MG/ML PO SOLN
100.0000 mg | Freq: Two times a day (BID) | ORAL | Status: DC
  Administered 2014-03-26 – 2014-04-01 (×12): 100 mg
  Filled 2014-03-26 (×16): qty 2.5

## 2014-03-26 MED ORDER — QUETIAPINE FUMARATE 200 MG PO TABS
200.0000 mg | ORAL_TABLET | Freq: Two times a day (BID) | ORAL | Status: DC
  Administered 2014-03-26 – 2014-04-02 (×16): 200 mg
  Filled 2014-03-26 (×20): qty 1

## 2014-03-26 MED ORDER — VALPROIC ACID 250 MG/5ML PO SYRP
250.0000 mg | ORAL_SOLUTION | Freq: Two times a day (BID) | ORAL | Status: DC
  Administered 2014-03-26 – 2014-04-02 (×15): 250 mg
  Filled 2014-03-26 (×19): qty 5

## 2014-03-26 MED ORDER — FREE WATER
200.0000 mL | Freq: Three times a day (TID) | Status: DC
  Administered 2014-03-26 – 2014-04-04 (×26): 200 mL

## 2014-03-26 MED ORDER — JEVITY 1.2 CAL PO LIQD
1000.0000 mL | ORAL | Status: DC
  Administered 2014-03-28 (×2): 1000 mL
  Administered 2014-03-29 – 2014-03-30 (×2)
  Administered 2014-04-01: 1000 mL
  Administered 2014-04-02: 08:00:00
  Filled 2014-03-26 (×23): qty 1000

## 2014-03-26 NOTE — Progress Notes (Signed)
Received patient from ambulance.  Patient arrived with trach and cervical collar.  Patient oriented to room and unit; denies pain.  Vitals obtained.  Suture intact to lower right leg incision; removed per order from Deatra Ina, PA.  Patient with healing surgical incisions to RLE.  Two incisions on RLE with pink areas noted but no drainage.  Will continue to monitor.
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
## 2014-03-26 NOTE — Progress Notes (Signed)
Note and chart reviewed. Agree with assessment/intervention. Layce Sprung Barnett RD, LDN Inpatient Clinical Dietitian Pager: [phone] After Hours Pager: [phone]  

## 2014-03-27 ENCOUNTER — Inpatient Hospital Stay (HOSPITAL_COMMUNITY): Payer: Self-pay

## 2014-03-27 ENCOUNTER — Inpatient Hospital Stay (HOSPITAL_COMMUNITY): Payer: Self-pay | Admitting: Speech Pathology

## 2014-03-27 ENCOUNTER — Inpatient Hospital Stay (HOSPITAL_COMMUNITY): Payer: Medicaid Other | Admitting: *Deleted

## 2014-03-27 LAB — COMPREHENSIVE METABOLIC PANEL
ALK PHOS: 109 U/L (ref 39–117)
ALT: 33 U/L (ref 0–53)
ANION GAP: 16 — AB (ref 5–15)
AST: 35 U/L (ref 0–37)
Albumin: 3.5 g/dL (ref 3.5–5.2)
BUN: 14 mg/dL (ref 6–23)
CHLORIDE: 101 meq/L (ref 96–112)
CO2: 22 mEq/L (ref 19–32)
Calcium: 9.4 mg/dL (ref 8.4–10.5)
Creatinine, Ser: 0.76 mg/dL (ref 0.50–1.35)
GFR calc non Af Amer: >90 mL/min (ref >90)
GLUCOSE: 67 mg/dL — AB (ref 70–99)
POTASSIUM: 5 meq/L (ref 3.7–5.3)
Sodium: 139 mEq/L (ref 137–147)
Total Bilirubin: 0.3 mg/dL (ref 0.3–1.2)
Total Protein: 7.8 g/dL (ref 6.0–8.3)

## 2014-03-27 LAB — CBC WITH DIFFERENTIAL/PLATELET
BASOS ABS: 0 10*3/uL (ref 0.0–0.1)
Basophils Relative: 1 % (ref 0–1)
EOS ABS: 0.3 10*3/uL (ref 0.0–0.7)
Eosinophils Relative: 5 % (ref 0–5)
HEMATOCRIT: 46.1 % (ref 39.0–52.0)
Hemoglobin: 14.6 g/dL (ref 13.0–17.0)
Lymphocytes Relative: 32 % (ref 12–46)
Lymphs Abs: 2.1 10*3/uL (ref 0.7–4.0)
MCH: 23.4 pg — ABNORMAL LOW (ref 26.0–34.0)
MCHC: 31.7 g/dL (ref 30.0–36.0)
MCV: 74 fL — ABNORMAL LOW (ref 78.0–100.0)
Monocytes Absolute: 0.5 10*3/uL (ref 0.1–1.0)
Monocytes Relative: 8 % (ref 3–12)
NEUTROS ABS: 3.6 10*3/uL (ref 1.7–7.7)
NEUTROS PCT: 55 % (ref 43–77)
Platelets: 184 10*3/uL (ref 150–400)
RBC: 6.23 MIL/uL — ABNORMAL HIGH (ref 4.22–5.81)
RDW: 15.5 % (ref 11.5–15.5)
WBC: 6.5 10*3/uL (ref 4.0–10.5)

## 2014-03-27 MED ORDER — CHLORHEXIDINE GLUCONATE CLOTH 2 % EX PADS
6.0000 | MEDICATED_PAD | Freq: Every day | CUTANEOUS | Status: AC
  Administered 2014-03-27 – 2014-03-31 (×5): 6 via TOPICAL

## 2014-03-27 MED ORDER — MUPIROCIN 2 % EX OINT
1.0000 "application " | TOPICAL_OINTMENT | Freq: Two times a day (BID) | CUTANEOUS | Status: AC
  Administered 2014-03-27 – 2014-03-31 (×10): 1 via NASAL
  Filled 2014-03-27: qty 22
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
## 2014-03-27 NOTE — Progress Notes (Signed)
Patient information reviewed and entered into eRehab system by Lorilei Horan, RN, CRRN, PPS Coordinator.  Information including medical coding and functional independence measure will be reviewed and updated through discharge.    
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
## 2014-03-27 NOTE — Progress Notes (Signed)
Physical Therapy Session Note  Patient Details  Name: Victor [name] MRN: [id number] Date of Birth: [birth year]  Today's Date: 03/27/2014 PT Individual Time: 0626-9485 PT Individual Time Calculation (min): 31 min   Short Term Goals: Week 1:  PT Short Term Goal 1 (Week 1): Patient will perform bed mobility with minA. PT Short Term Goal 2 (Week 1): Patient will perform functional transfers with maxA x1. PT Short Term Goal 3 (Week 1): Patient will ambulate 61' with +2 assist. PT Short Term Goal 4 (Week 1): Patient will negotiate 2 steps with B handrails and +2 assist.  Skilled Therapeutic Interventions/Progress Updates:    Patient received sitting in wheelchair. Session focused on increasing activity tolerance to standing. Patient missed 21 minutes at beginning of session due to nursing care (suctioning, PEG tube feeds, etc.) and 8 min at end of session secondary to increased agitation and need for rest to de-escalate. Session focused on use of standing frame to increase tolerance to upright and to facilitate heel cord stretching. Patient performed 2 stands, but only able to tolerate standing for approximately 10-15" each. Patient with poor frustration tolerance and increasing agitation. Patient returned to room for de-escalation. Education on use of call bell and TV control functions. Patient smiling and visibly excited for increased independence with use of TV remote. Patient left sitting in tilt-n-space wheelchair with seatbelt donned and all needs within reach.  Therapy Documentation Precautions:  Precautions Precautions: Fall Precaution Comments: abdominal binder over peg, trach Required Braces or Orthoses:  (d/c'd 9/3) Cervical Brace: Soft collar;At all times Restrictions Weight Bearing Restrictions: No General: PT Amount of Missed Time (min): 29 Minutes PT Missed Treatment Reason: Nursing care;Increased agitation Vital Signs: Therapy Vitals Pulse Rate: 76 Oxygen Therapy SpO2: 100 % O2  Device: None (Room air) Pulse Oximetry Type: Intermittent Pain: Pain Assessment Pain Assessment: No/denies pain PAINAD (Pain Assessment in Advanced Dementia) Breathing: normal Negative Vocalization: occasional moan/groan, low speech, negative/disapproving quality Facial Expression: facial grimacing Body Language: tense, distressed pacing, fidgeting Consolability: no need to console PAINAD Score: 4  See FIM for current functional status  Therapy/Group: Individual Therapy  [name] Hamed Debella, PT, DPT 03/27/2014, 1:56 PM

## 2014-03-27 NOTE — Progress Notes (Signed)
Occupational Therapy Note  Patient Details  Name: Denzil Mceachron MRN: [id number] Date of Birth: Jun 07, 1991  Today's Date: 03/27/2014 OT Individual Time: 1400-1430 OT Individual Time Calculation (min): 30 min  Pt denied pain  Pt seen for skilled OT services with focus on orientation, following one step commands, use of call bell/TV remote.  Attempted to assess vision but patient unable accurately respond to questions/commands.  Pt verbalized understanding of remote operation but was unable to accurately operate remote even after demonstrating and providing HOH assist.  Pt not oriented to place, date, or situation.  Pt stated he was at [facility] and denied he was in hospital.    [name] 03/27/2014, 2:44 PM

## 2014-03-27 NOTE — Progress Notes (Signed)
Orthopedic Tech Progress Note Patient Details:  Victor [name] 1991-06-13 [id number] Delivered Ortho Devices Type of Ortho Device: Abdominal binder Ortho Device/Splint Interventions: Ordered   [place] 03/27/2014, 3:27 PM

## 2014-03-27 NOTE — Progress Notes (Signed)
Messages left for dental service/Dr. Jeanice Lim [phone] regarding remaining hardware in his mouth after oral surgery. Await plan of care-

## 2014-03-27 NOTE — Progress Notes (Signed)
Attempted to downsize pt trach from a 6 to a 4 cuffless. When attempting to remove #6 shiley trach, trach was hard to remove and stuck. #6 trach remains in place. RN notified. Vitals stable throughout. MD paged.

## 2014-03-27 NOTE — Progress Notes (Signed)
Reported by RT they were unable to replace #6 trach with #4. Paged Jesusita Oka, [place] to April, RT phone at 1245 pm. The nurse was notified at 1700 pm that April, RT did not speak to [place], [place] to make him aware of the attempt to replace #6 trach with #4.  At 1715 pm the nurse made Dan, [place] aware of the attempt of RT to replace #6 trach. [place]
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
## 2014-03-27 NOTE — Progress Notes (Signed)
Pt is OTF and in rehab, unable to do check will check pt when returns.
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
## 2014-03-28 ENCOUNTER — Inpatient Hospital Stay (HOSPITAL_COMMUNITY): Payer: Medicaid Other | Admitting: Occupational Therapy

## 2014-03-28 ENCOUNTER — Encounter (HOSPITAL_COMMUNITY): Payer: Self-pay

## 2014-03-28 ENCOUNTER — Inpatient Hospital Stay (HOSPITAL_COMMUNITY): Payer: Self-pay

## 2014-03-28 ENCOUNTER — Inpatient Hospital Stay (HOSPITAL_COMMUNITY): Payer: Medicaid Other

## 2014-03-28 ENCOUNTER — Inpatient Hospital Stay (HOSPITAL_COMMUNITY): Payer: Self-pay | Admitting: Speech Pathology

## 2014-03-28 MED ORDER — PROPRANOLOL HCL 20 MG/5ML PO SOLN
20.0000 mg | Freq: Four times a day (QID) | ORAL | Status: DC
  Administered 2014-03-28 – 2014-04-25 (×103): 20 mg
  Filled 2014-03-28 (×119): qty 5
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
## 2014-03-28 NOTE — Progress Notes (Signed)
Occupational Therapy Session Note  Patient Details  Name: Victor [name] MRN: [id number] Date of Birth: July 08, 1991  Today's Date: 03/28/2014 OT Individual Time: 1030-1100 OT Individual Time Calculation (min): 30 min   Short Term Goals: Week 1:  OT Short Term Goal 1 (Week 1): Patient will initiate anterior weight shift in preparation for functoinal transfer with min cues  OT Short Term Goal 2 (Week 1): Patient will be oriented to x3 with mod cues OT Short Term Goal 3 (Week 1): Patient will demonstrate sustained attention to functional task for 45 seconds in controlled environment OT Short Term Goal 4 (Week 1): Patient will verbalize 2 deficits with mod cues  OT Short Term Goal 5 (Week 1): Patient will complete sit<>stand during self-care task with max assist +1  Skilled Therapeutic Interventions/Progress Updates:  Patient resting in bed upon arrival.  Engaged in grooming task, bed mobility, orientation, vision and one step commands.  Patient declined to sit EOB or to raise [facility] to perform oral care.  After discussion, patient agreed to allow [facility] up for safety related to the task.  Patient asked to assist with his BLEs to push to position his body closer to the [facility].  Patient followed commands to bend knees yet did not push secondary to reports that he can't push.  Patient total assist for this task.  Patient opened tooth paste yet when asked to put toothpaste onto toothbrush, he squeezed out much more than needed and required assist to place paste on brush as though unable to see (question visual field deficit) to complete tasks.  Patient performed ~75% of the task of brushing teeth and was able to swish water to rinse and allow this OT to use suction to remove liquid.  Patient provided with mouth moisturizer to place on his lips however he placed it on his teeth.  Asked again to place on his lips and he proceeded to place it on his gums.  Patient questioning "is this my lips?" then placed the moisturizer on  gums again despite demonstration.  On the 4th attempt, patient applied the moisturizer on his lips.  Patient did not think he was in the hospital and thought he was in [place].  Patient able to partially tell time using face of the clock and appeared to not see the left side of the clock when it was placed at midline.  Therapy Documentation Precautions:  Precautions Precautions: Fall Precaution Comments: abdominal binder over peg, trach Required Braces or Orthoses:  (d/c'd 9/3) Cervical Brace: Soft collar;At all times Restrictions Weight Bearing Restrictions: No Pain: No report of pain ADL: See FIM for current functional status  Therapy/Group: Individual Therapy  [name] 03/28/2014, 12:27 PM

## 2014-03-28 NOTE — Progress Notes (Signed)
Occupational Therapy Session Note  Patient Details  Name: Victor [name] MRN: [id number] Date of Birth: 1990-12-07  Today's Date: 03/28/2014 OT Co-Treatment Time:  - 1330-1400  30 min  (total time 1300-1400 co treat with KNP)     Short Term Goals: Week 1:  OT Short Term Goal 1 (Week 1): Patient will initiate anterior weight shift in preparation for functoinal transfer with min cues  OT Short Term Goal 2 (Week 1): Patient will be oriented to x3 with mod cues OT Short Term Goal 3 (Week 1): Patient will demonstrate sustained attention to functional task for 45 seconds in controlled environment OT Short Term Goal 4 (Week 1): Patient will verbalize 2 deficits with mod cues  OT Short Term Goal 5 (Week 1): Patient will complete sit<>stand during self-care task with max assist +1  Skilled Therapeutic Interventions/Progress Updates:    Addressed visual scanning, attention, fine motor, motor planning, visual perception, following commands.  Took pt to [facility] gym with O2 on 4 liters and NG feeding tube and pump.  Had pt practice scanning objects with mod cues for visual attention (sustained) .  Pt. Tended to bring paper about 5 inches from his eyes to see.  It appeared that he has some "holes" with his visual acuity.  He stated he did better without the glasses on and then later said he did better with the glasses on.   Pt showed disorganized pattern with number search and difficultly reading correct number.  He was disoriented to place stating he was at school or on probation.  He verbalized frustration with tasks from time to time but responded well to positive praise.  Pt is inconsistent with his dominant hand.    He switch to Left hand during session when writing.  He tended to write letters very close to each other on even on top of another letter especially when drawing clock (bunched up on left left side and outside of clock.   Pt returned to room and left with sitter.       Therapy  Documentation Precautions:  Precautions Precautions: Fall Precaution Comments: abdominal binder over peg, trach Required Braces or Orthoses:  (d/c'd 9/3) Cervical Brace: Soft collar;At all times Restrictions Weight Bearing Restrictions: No General: General PT Missed Treatment Reason: Nursing care Vital Signs: Therapy Vitals Patient Position (if appropriate): Sitting Oxygen Therapy O2 Device: None (Room air) (pt receiving PT) Pain:  none           See FIM for current functional status  Therapy/Group: Co-Treatment  Humberto Seals 03/28/2014, 2:01 PM

## 2014-03-28 NOTE — Progress Notes (Signed)
Chart and note reviewed. Agree with note.  Mallorie Norrod, RD, LDN, CNSC Pager [phone] After Hours Pager [phone]   

## 2014-03-28 NOTE — Plan of Care (Signed)
Problem: RH BLADDER ELIMINATION Goal: RH STG MANAGE BLADDER WITH ASSISTANCE STG Manage Bladder With moderate Assistance  Outcome: Not Progressing Requires max assist at this time

## 2014-03-28 NOTE — Progress Notes (Signed)
Occupational Therapy Session Note  Patient Details  Name: Victor [name] MRN: [id number] Date of Birth: February 21, 1991  Today's Date: 03/28/2014 OT Individual Time: 1100-1200 OT Individual Time Calculation (min): 30 min  OT Cotreatment Time: 1300-1330 (co-tx with JE 1300-1400) OT Cotreatment Time Calculation (min): 30 min   Short Term Goals: Week 1:  OT Short Term Goal 1 (Week 1): Patient will initiate anterior weight shift in preparation for functoinal transfer with min cues  OT Short Term Goal 2 (Week 1): Patient will be oriented to x3 with mod cues OT Short Term Goal 3 (Week 1): Patient will demonstrate sustained attention to functional task for 45 seconds in controlled environment OT Short Term Goal 4 (Week 1): Patient will verbalize 2 deficits with mod cues  OT Short Term Goal 5 (Week 1): Patient will complete sit<>stand during self-care task with max assist +1  Skilled Therapeutic Interventions/Progress Updates:    Session 1: Pt seen for ADL retraining with focus on initiation, sequencing, attention, sitting balance, and functional transfers. Pt received supine in bed, oriented to person only. Completed bathing sitting EOB with min-SBA dynamic sitting balance. Pt required mod multimodal cues for sequencing of bathing secondary to perseveration and decreased recall of washed body parts. Pt completed sit<>stand +2 assist (3 musketeer technique) 3x with max cues for anterior weight shift. Completed dressing using crossover technique and max cues with total assist to manage pants into correct hole secondary to decreased awareness. Completed squat pivot transfer bed>w/c with mod assist +2 and mod cues for anterior weight shift. Pt responds better to "nose over toes" vs "lean forward" to facilitate anterior weight shift during functional transfers. Pt demonstrated sustained attention up to 15 seconds during self-care tasks. Pt left sitting in tilt-in-space w/c with all needs in reach.   Session 2: Pt seen  for Co-tx OT session with focus on visual scanning, sustained attention, command following, coordination. Pt received sitting in w/c. Pt oriented to person only and required max assist for orientation throughout session as he perseverated on being "at school." Engaged in visual assessments of drawing clock and scanning numbers. Pt required mod cues for sustained attention to task. Pt demonstrated disorganized pattern when scanning numbers. Patient with increased difficulty identifying numbers and letters in all visual fields. Pt demonstrates perceptual and spatial deficits throughout activities. Pt also began activities writing with R hand then switched midway throught session to using LUE as dominant hand with same legibility. Engaged in sorting task of placing red/black tokens in a row on connect 4 game. Pt located tokens on L and R without difficulty. Pt accurately sorted all tokens and demonstrated overshooting when placing at target. Pt perseverating on "needing to go back to school," providing inconsistent answer with last grade he completed. Pt returned to room and left with sitter.   Therapy Documentation Precautions:  Precautions Precautions: Fall Precaution Comments: abdominal binder over peg, trach Required Braces or Orthoses:  (d/c'd 9/3) Cervical Brace: Soft collar;At all times Restrictions Weight Bearing Restrictions: No General:   Vital Signs: Therapy Vitals Patient Position (if appropriate): Sitting Oxygen Therapy O2 Device: None (Room air) (pt receiving PT) Pain: No report of pain during therapy sessions.   See FIM for current functional status  Therapy/Group: Individual Therapy and Co-Treatment  December Hedtke, Vara Guardian 03/28/2014, 12:08 PM

## 2014-03-28 NOTE — Progress Notes (Signed)
Patient ID: Victor [name], male   DOB: September 09, 1990, 23 y.o.   MRN: [id number]  Called by Dr. Riley Kill secondary to problems changing his tracheostomy. Moderate difficulty removing old #6 cuffed shiley, replaced with cuffless #4 without difficulty. Mild amount of bleeding noted. No respiratory difficulty.    Freeman Caldron, PA-C Pager: [phone] General Trauma PA Pager: [phone]

## 2014-03-28 NOTE — Progress Notes (Signed)
Physical Therapy Session Note  Patient Details  Name: Victor [name] MRN: [id number] Date of Birth: 02-19-1991  Today's Date: 03/28/2014 PT Co-Treatment Time: 0910-0930 (charged by Zerita Boers, PT, DPT); [phone] (charged by Jorje Guild, PT, DPT) PT Co-Treatment Time Calculation (min): 60 min  Short Term Goals: Week 1:  PT Short Term Goal 1 (Week 1): Patient will perform bed mobility with minA. PT Short Term Goal 2 (Week 1): Patient will perform functional transfers with maxA x1. PT Short Term Goal 3 (Week 1): Patient will ambulate 28' with +2 assist. PT Short Term Goal 4 (Week 1): Patient will negotiate 2 steps with B handrails and +2 assist.  Skilled Therapeutic Interventions/Progress Updates:  Pt missed at start of session due to nursing needs regarding medication. Pt received semi-reclined in bed, alert with sitter at bed side. Physical therapy co-tx this session for emphasis on activity tolerance, sustained attention, functional transfers and dynamic sitting balance. Pt req mod A for t/f sup>sit EOB x2, Ax2 persons for t/f sit>sup in attempt to don abdominal binder but too small, RN made aware. Pt req Ax2 persons for safe squat pivot t/f bed>w/c<>tx mat with increased success using bobath method as pt is fearful of anterior lean as well as extensor tone. Pt sitting on elevated tx mat engaged in reaching task w/ R UE in all 4 quadrants to facilitate automatic anterior weight shift, increased WB through B LE and L awareness. L UE support initially provided by w/c handle directly in front of pt req min guard-min A for balance, min-mod A for sitting balance w/out UE support. Pt often stating, "I can't reach that" but then subsequently able to with encouragement. Pt with poor attention to task at end of session due to fatigue. Overall, pt req mod cues for sustained attention to tasks throughout session. Total A for w/c transport room<>gym. Pt left in tilt-in-space w/c in care of nurse tech at end  of session, sitting in room. Pt able to maintain SaO2 >99% on 5L O2 via trach.   Therapy Documentation Precautions:  Precautions Precautions: Fall Precaution Comments: abdominal binder over peg, trach Required Braces or Orthoses:  (d/c'd 9/3) Cervical Brace: Soft collar;At all times Restrictions Weight Bearing Restrictions: No General: PT Amount of Missed Time (min): 10 Minutes PT Missed Treatment Reason: Nursing care Pain: Pain Assessment Pain Assessment: 0-10 Pain Score: 5  Pain Type: Acute pain Pain Location: Head Pain Orientation: Right Pain Descriptors / Indicators: Aching Pain Intervention(s): Refused  See FIM for current functional status  Therapy/Group: Co-Treatment  Denzil Hughes, PT, DPT Jorje Guild, PT, DPT 03/28/2014, 12:37 PM

## 2014-03-28 NOTE — Progress Notes (Signed)
Randle Shatzer, MD, MPH, FACS Trauma: [phone] General Surgery: [phone]  

## 2014-03-28 NOTE — Progress Notes (Signed)
Speech Language Pathology Daily Session Note  Patient Details  Name: Victor [name] MRN: [id number] Date of Birth: 1991/03/08  Today's Date: 03/28/2014 SLP Individual Time: 1400-1500 SLP Individual Time Calculation (min): 60 min  Short Term Goals: Week 1: SLP Short Term Goal 1 (Week 1): Patient will tolerate PMSV for 15 minutes while vitals remain WNL with [name]. SLP Short Term Goal 2 (Week 1): Patient will increase length of verbal expression to the phrase level with PMSV placed with Min cues for intemsity. SLP Short Term Goal 3 (Week 1): Patient with utilize external aids for recall of orientation information with Max multimodal cues. SLP Short Term Goal 4 (Week 1): Patient will verbalize 1 physical and 1 cognitive deficit with Max question cues. SLP Short Term Goal 5 (Week 1): Patient will consume trilas of ice chips with PMSV placed and initiate swallow sequence with Max multimodal cues. SLP Short Term Goal 6 (Week 1): Patient will attend to basic self-care tasks for 5 minutes with Max cues for redirections.  Skilled Therapeutic Interventions: Skilled treatment session focused on addressing diagnostic treatment of reading and writing as well as PMSV goals.  Patient unable to identify single letters of numbers; however, when presented within context such as the calendar or his name patient was able to verbalize info accurately.  Patient identified name form a field of 3 with different initial letters Mod I and required Min cues with names with same initial letters.  Patient also able to complete automatic verbal sequences with initial cue from SLP to end verbal perseveration on last task.  SLP also facilitated session with PMSV placement for 15 minutes with removal every 5 minutes with no indication of breath stacking.  Trach downsize appeared to improve PMSV toleration; however recommend continued trials with SLP.  Patient did demonstrate intermittent coughing which SLP suspects was a result of  increased use of airway.   Recommend to continue with current plan of care.   FIM:  Comprehension Comprehension Mode: Auditory Comprehension: 2-Understands basic 25 - 49% of the time/requires cueing 51 - 75% of the time Expression Expression Mode: Verbal Expression Assistive Devices: 6-Other (Comment) (open trach ) Expression: 3-Expresses basic 50 - 74% of the time/requires cueing 25 - 50% of the time. Needs to repeat parts of sentences. Social Interaction Social Interaction: 3-Interacts appropriately 50 - 74% of the time - May be physically or verbally inappropriate. Problem Solving Problem Solving: 1-Solves basic less than 25% of the time - needs direction nearly all the time or does not effectively solve problems and may need a restraint for safety Memory Memory: 1-Recognizes or recalls less than 25% of the time/requires cueing greater than 75% of the time  Pain Pain Assessment Pain Assessment: No/denies pain  Therapy/Group: Individual Therapy  Charlane Ferretti., CCC-SLP [phone]  Aashka Salomone 03/28/2014, 4:48 PM

## 2014-03-29 ENCOUNTER — Inpatient Hospital Stay (HOSPITAL_COMMUNITY): Payer: Self-pay

## 2014-03-29 ENCOUNTER — Inpatient Hospital Stay (HOSPITAL_COMMUNITY): Payer: Medicaid Other | Admitting: Speech Pathology

## 2014-03-29 DIAGNOSIS — S069XAA Unspecified intracranial injury with loss of consciousness status unknown, initial encounter: Secondary | ICD-10-CM

## 2014-03-29 DIAGNOSIS — S069X9A Unspecified intracranial injury with loss of consciousness of unspecified duration, initial encounter: Secondary | ICD-10-CM

## 2014-03-29 DIAGNOSIS — W19XXXA Unspecified fall, initial encounter: Secondary | ICD-10-CM
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
## 2014-03-29 NOTE — Progress Notes (Signed)
Physical Therapy Session Note  Patient Details  Name: Victor [name] MRN: [id number] Date of Birth: 10-10-1990  Today's Date: 03/29/2014 PT Individual Time: 1330-1400 PT Individual Time Calculation (min): 30 min   Short Term Goals: Week 1:  PT Short Term Goal 1 (Week 1): Patient will perform bed mobility with minA. PT Short Term Goal 2 (Week 1): Patient will perform functional transfers with maxA x1. PT Short Term Goal 3 (Week 1): Patient will ambulate 38' with +2 assist. PT Short Term Goal 4 (Week 1): Patient will negotiate 2 steps with B handrails and +2 assist.  Skilled Therapeutic Interventions/Progress Updates:    Pt received supine in bed with nurse tech present assessing vitals and pt agreeable to therapy. Pt asking questions to nurse tech and PT about facebook and a dating website. Pt redirected attention to therapy and sports. Pt assisted to EOB, requiring mod A for supine to sit transfer with cues for hand and foot placements. Pt able to maintain sitting balance at supervision level. Pt completed reach tasks, reach outside BOS and into all four quadrants and returning to center. Pt had no LOB. Pt requires redirection to task at times. Pt returned to supine and focused on rolling, requiring min/modA for rolling left and right. Pt positioned for comfort. Pt assisted with changing the channel to college football station. Bed exit on and all needs within reach.   Therapy Documentation Precautions:  Precautions Precautions: Fall Precaution Comments: abdominal binder over peg, trach Required Braces or Orthoses:  (d/c'd 9/3) Cervical Brace: Soft collar;At all times Restrictions Weight Bearing Restrictions: No Vital Signs: Therapy Vitals Pulse Rate: 56 Resp: 18 Oxygen Therapy SpO2: 100 % O2 Device: Trach collar O2 Flow Rate (L/min): 5 L/min FiO2 (%): 28 % Pain: Pain Assessment Pain Assessment: No/denies pain Pain Score: 0-No pain Locomotion : Ambulation Ambulation/Gait  Assistance: Not tested (comment)   See FIM for current functional status  Therapy/Group: Individual Therapy  Quintarius Ferns R 03/29/2014, 4:15 PM

## 2014-03-29 NOTE — Plan of Care (Signed)
Problem: RH SAFETY Goal: RH STG ADHERE TO SAFETY PRECAUTIONS W/ASSISTANCE/DEVICE STG Adhere to Safety Precautions With moderate Assistance/Device.  Outcome: Not Progressing Patient with 1:1 sitter
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
## 2014-03-29 NOTE — Progress Notes (Signed)
Speech Language Pathology Daily Session Note  Patient Details  Name: Victor [name] MRN: [id number] Date of Birth: April 10, 1991  Today's Date: 03/29/2014 SLP Individual Time: 1030-1100 SLP Individual Time Calculation (min): 30 min  Short Term Goals: Week 1: SLP Short Term Goal 1 (Week 1): Patient will tolerate PMSV for 15 minutes while vitals remain WNL with [name]. SLP Short Term Goal 2 (Week 1): Patient will increase length of verbal expression to the phrase level with PMSV placed with Min cues for intemsity. SLP Short Term Goal 3 (Week 1): Patient with utilize external aids for recall of orientation information with Max multimodal cues. SLP Short Term Goal 4 (Week 1): Patient will verbalize 1 physical and 1 cognitive deficit with Max question cues. SLP Short Term Goal 5 (Week 1): Patient will consume trilas of ice chips with PMSV placed and initiate swallow sequence with Max multimodal cues. SLP Short Term Goal 6 (Week 1): Patient will attend to basic self-care tasks for 5 minutes with Max cues for redirections.  Skilled Therapeutic Interventions: Skilled treatment session focused on speech and cognitive goals.  SLP facilitated session with PMSV placement for 20 minutes with removal every 5 minutes with no indication of breath stacking.  Patient tolerated the PMSV without difficulty and all vital remained WFL. Patient was able to demonstrate an adequate voice intensity at the phrase and sentence level and was 100% intelligible. Patient required total A for orientation to place, time, situation and his correct age. Patient also demonstrated language of confusion and required total A for intellectual awareness of physical or cognitive impairments.  PMSV was removed and patient left supine in bed with sitter and family members present. Continue with current plan of care.    FIM:  Comprehension Comprehension Mode: Auditory Comprehension: 2-Understands basic 25 - 49% of the time/requires cueing 51 -  75% of the time Expression Expression Mode: Verbal Expression: 3-Expresses basic 50 - 74% of the time/requires cueing 25 - 50% of the time. Needs to repeat parts of sentences. Social Interaction Social Interaction: 3-Interacts appropriately 50 - 74% of the time - May be physically or verbally inappropriate. Problem Solving Problem Solving: 1-Solves basic less than 25% of the time - needs direction nearly all the time or does not effectively solve problems and may need a restraint for safety Memory Memory: 1-Recognizes or recalls less than 25% of the time/requires cueing greater than 75% of the time  Pain Pain Assessment Pain Assessment: No/denies pain  Therapy/Group: Individual Therapy  Aissatou Fronczak 03/29/2014, 1:16 PM

## 2014-03-30 ENCOUNTER — Inpatient Hospital Stay (HOSPITAL_COMMUNITY): Payer: Self-pay | Admitting: Physical Therapy

## 2014-03-30 ENCOUNTER — Inpatient Hospital Stay (HOSPITAL_COMMUNITY): Payer: Medicaid Other | Admitting: Occupational Therapy

## 2014-03-30 DIAGNOSIS — S069XAA Unspecified intracranial injury with loss of consciousness status unknown, initial encounter: Secondary | ICD-10-CM

## 2014-03-30 DIAGNOSIS — W19XXXA Unspecified fall, initial encounter: Secondary | ICD-10-CM

## 2014-03-30 DIAGNOSIS — S069X9A Unspecified intracranial injury with loss of consciousness of unspecified duration, initial encounter: Secondary | ICD-10-CM
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
## 2014-03-31 ENCOUNTER — Inpatient Hospital Stay (HOSPITAL_COMMUNITY): Payer: Medicaid Other

## 2014-03-31 ENCOUNTER — Inpatient Hospital Stay (HOSPITAL_COMMUNITY): Payer: Medicaid Other | Admitting: *Deleted

## 2014-03-31 ENCOUNTER — Inpatient Hospital Stay (HOSPITAL_COMMUNITY): Payer: Self-pay

## 2014-03-31 ENCOUNTER — Inpatient Hospital Stay (HOSPITAL_COMMUNITY): Payer: Medicaid Other | Admitting: Speech Pathology

## 2014-03-31 NOTE — Progress Notes (Signed)
Speech Language Pathology Daily Session Note  Patient Details  Name: Victor [name] MRN: [id number] Date of Birth: 1990-12-01  Today's Date: 03/31/2014 SLP Individual Time: 0900-1000 SLP Individual Time Calculation (min): 60 min  Short Term Goals: Week 1: SLP Short Term Goal 1 (Week 1): Patient will tolerate PMSV for 15 minutes while vitals remain WNL with [name]. SLP Short Term Goal 2 (Week 1): Patient will increase length of verbal expression to the phrase level with PMSV placed with Min cues for intemsity. SLP Short Term Goal 3 (Week 1): Patient with utilize external aids for recall of orientation information with Max multimodal cues. SLP Short Term Goal 4 (Week 1): Patient will verbalize 1 physical and 1 cognitive deficit with Max question cues. SLP Short Term Goal 5 (Week 1): Patient will consume trilas of ice chips with PMSV placed and initiate swallow sequence with Max multimodal cues. SLP Short Term Goal 6 (Week 1): Patient will attend to basic self-care tasks for 5 minutes with Max cues for redirections.  Skilled Therapeutic Interventions: Skilled treatment session focused on speech, cognitive-linguistic and dysphagia goals.  SLP facilitated session with PMSV placement for 55 minutes with intermittent removal with no indication of breath stacking.  Patient tolerated the PMSV without difficulty and all vitals remained WFL, therefore, recommend patient wear the PMSV during all waking hours with full supervision (RN and sitter notified and educated).  Patient initially required total A for orientation to place, time, situation and his correct age, however, was able to verbalize orientation X 4 with Mod-Max A multimodal cues intermittently throughout the session. Patient demonstrated language of confusion but demonstrated increased awareness of confusion with Max A multimodal cues. SLP administered oral care via a suction toothbrush and patient consumed trials of ice chips without overt s/s of  aspiration but demonstrated a delayed swallow initiation with piecemeal swallowing with use of multiple swallows and required Min A verbal cues for appropriate lip closure/oral manipulation of bolus.  Patient left in bed with sitter present. Continue with current plan of care.    FIM:  Comprehension Comprehension Mode: Auditory Comprehension: 2-Understands basic 25 - 49% of the time/requires cueing 51 - 75% of the time Expression Expression Mode: Verbal Expression Assistive Devices: 6-Talk trach valve Expression: 3-Expresses basic 50 - 74% of the time/requires cueing 25 - 50% of the time. Needs to repeat parts of sentences. Social Interaction Social Interaction: 3-Interacts appropriately 50 - 74% of the time - May be physically or verbally inappropriate. Problem Solving Problem Solving: 1-Solves basic less than 25% of the time - needs direction nearly all the time or does not effectively solve problems and may need a restraint for safety Memory Memory: 1-Recognizes or recalls less than 25% of the time/requires cueing greater than 75% of the time FIM - Eating Eating Activity: 1: Helper feeds patient  Pain Pain Assessment Pain Assessment: No/denies pain Pain Score: 0-No pain  Therapy/Group: Individual Therapy  Felipa Laroche 03/31/2014, 1:11 PM
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
## 2014-03-31 NOTE — Progress Notes (Signed)
Occupational Therapy Session Note  Patient Details  Name: Victor [name] MRN: [id number] Date of Birth: [birth year]  Today's Date: 03/31/2014 OT Individual Time: 1100-1200 OT Individual Time Calculation (min): 60 min    Short Term Goals: Week 1:  OT Short Term Goal 1 (Week 1): Patient will initiate anterior weight shift in preparation for functoinal transfer with min cues  OT Short Term Goal 2 (Week 1): Patient will be oriented to x3 with mod cues OT Short Term Goal 3 (Week 1): Patient will demonstrate sustained attention to functional task for 45 seconds in controlled environment OT Short Term Goal 4 (Week 1): Patient will verbalize 2 deficits with mod cues  OT Short Term Goal 5 (Week 1): Patient will complete sit<>stand during self-care task with max assist +1  Skilled Therapeutic Interventions/Progress Updates: ADL-retraining with emphasis on sustained attention, improved awareness of deficits, sit<>stand, and orientation during assisted BADL.   Pt received seated in w/c and receptive for planned task with max vc to redirect from tangential and/or unrelated dialog.   With setup assist and max vc to sequence, pt removed gown and washed upper body.    Pt removed shoes and socks after re-ed on technique and manual facilitation to cross over each leg.   Pt required total assist for toilet hygiene d/t soiled diaper but sustained supported standing with only mod assist X 1 while helper completed cleansing buttocks.   Pt required max assist to pull up new diaper and pants but managed to lace each foot through pants with setup assist to correct error.   Pt stood again to allow assist with pulling up pants however requiring mod facilitation to place and align pelvis with shoulders for improved static standing balance during assisted dressing.   Pt donned shirt, lacing arms and head through appropriate holes and required only min assist to pull shirt over his back.   Pt left in w/c at end of session with sitter  attending for safety.     Therapy Documentation Precautions:  Precautions Precautions: Fall Precaution Comments: abdominal binder over peg, trach Required Braces or Orthoses: Other Brace/Splint Cervical Brace: Other (comment) Other Brace/Splint: Cervical Collar D/C'ed Restrictions Weight Bearing Restrictions: No Other Position/Activity Restrictions: WBAT LLE  Vital Signs: Therapy Vitals Pulse Rate: 88 Resp: 20 Patient Position (if appropriate): Sitting Oxygen Therapy SpO2: 96 % O2 Device: None (Room air) O2 Flow Rate (L/min): 5 L/min FiO2 (%): 28 % Pulse Oximetry Type: Intermittent  Pain: Pain Assessment Pain Assessment: No/denies pain Pain Score: 0-No pain  See FIM for current functional status  Therapy/Group: Individual Therapy  Victor [name] 03/31/2014, 12:29 PM
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
## 2014-03-31 NOTE — Progress Notes (Addendum)
Physical Therapy Session Note  Patient Details  Name: Victor [name] MRN: [id number] Date of Birth: 27-May-1991  Today's Date: 03/31/2014 PT Individual Time: 1630-1700 PT Individual Time Calculation (min): 30 min   Short Term Goals: Week 1:  PT Short Term Goal 1 (Week 1): Patient will perform bed mobility with minA. PT Short Term Goal 2 (Week 1): Patient will perform functional transfers with maxA x1. PT Short Term Goal 3 (Week 1): Patient will ambulate 49' with +2 assist. PT Short Term Goal 4 (Week 1): Patient will negotiate 2 steps with B handrails and +2 assist.  Skilled Therapeutic Interventions/Progress Updates:  1:1. Pt received semi-reclined in bed, ready for therapy. Focus this session on sustained attention, orientation, following simple commands and standing tolerance/balance. Pt oriented to self and location, but req total consistent cues throughout session for orientation to situation and time. Pt perseverating on "probation period" with mod cues for frequent redirection. Pt req min A for t/f sup>sit EOB. Pt engaged in multiple t/f sit<>stand from slightly elevated bed w/ handle of tilt-in-space directly in front of pt for B UE support. Pt req min-mod A for t/f sit<>stand as well as standing balance due to gross extension with mod encouragement to maintain standing due to decreased tolerance to WB through R LE. Pt engaged in reaching for objects to promote anterior weight shift and distraction by conversation to tolerate standing up to 10 seconds. Pt req max A for t/f sit>sup at end of session. Pt left semi-reclined in bed w/ all needs in reach, bed alarm on and sitting in room.    Pt on 5L O2 via trach this session, SaO2 fluctuating 90s<>60s. Uncertain if accurate reading or due to poor contact from continuous pulse ox.   Therapy Documentation Precautions:  Precautions Precautions: Fall Precaution Comments: abdominal binder over peg, trach Required Braces or Orthoses: Other  Brace/Splint Cervical Brace: Other (comment) Other Brace/Splint: Cervical Collar D/C'ed Restrictions Weight Bearing Restrictions: No Other Position/Activity Restrictions: WBAT LLE  See FIM for current functional status  Therapy/Group: Individual Therapy  Denzil Hughes 03/31/2014, 5:22 PM

## 2014-04-01 ENCOUNTER — Inpatient Hospital Stay (HOSPITAL_COMMUNITY): Payer: Self-pay

## 2014-04-01 ENCOUNTER — Inpatient Hospital Stay (HOSPITAL_COMMUNITY): Payer: Medicaid Other | Admitting: Speech Pathology

## 2014-04-01 ENCOUNTER — Inpatient Hospital Stay (HOSPITAL_COMMUNITY): Payer: Medicaid Other | Admitting: Occupational Therapy

## 2014-04-01 ENCOUNTER — Inpatient Hospital Stay (HOSPITAL_COMMUNITY): Payer: Medicaid Other | Admitting: *Deleted

## 2014-04-01 ENCOUNTER — Encounter (HOSPITAL_COMMUNITY): Payer: Self-pay

## 2014-04-01 MED ORDER — LEVETIRACETAM 100 MG/ML PO SOLN
500.0000 mg | Freq: Two times a day (BID) | ORAL | Status: DC
  Administered 2014-04-01 – 2014-04-25 (×48): 500 mg
  Filled 2014-04-01 (×52): qty 5

## 2014-04-01 NOTE — Progress Notes (Signed)
Occupational Therapy Session Note  Patient Details  Name: Victor [name] MRN: [id number] Date of Birth: 11/15/1990  Today's Date: 04/01/2014 OT Individual Time: 0488-8916 and 1300-1330  OT Individual Time Calculation (min): 47 min and 30 min    Short Term Goals: Week 1:  OT Short Term Goal 1 (Week 1): Patient will initiate anterior weight shift in preparation for functoinal transfer with min cues  OT Short Term Goal 2 (Week 1): Patient will be oriented to x3 with mod cues OT Short Term Goal 3 (Week 1): Patient will demonstrate sustained attention to functional task for 45 seconds in controlled environment OT Short Term Goal 4 (Week 1): Patient will verbalize 2 deficits with mod cues  OT Short Term Goal 5 (Week 1): Patient will complete sit<>stand during self-care task with max assist +1  Skilled Therapeutic Interventions/Progress Updates:    Session 1: Pt seen for ADL retraining with focus on functional use of LUE, coordination, sit<>stand, activity tolerance, and cognitive remediation. Pt received sitting in w/c oriented to person only. Completed bathing at sink with pt initiating use of bil hands for wringing wash cloth and washing body parts. Mod multimodal cues providing for sequencing during bathing d/t preseveration and impaired attention. Completed sit<>stand with mod assist +2 and use of mirror with verbal feedback for postural control. Pt with significant apraxia and language of confusion during therapy session. Pt with poor frustration tolerance, however, easily redirected. Pt left sitting in w/c with uncle present and all needs in reach.   Session 2: Pt need for 1:1 OT session with focus on sit<>stand, postural control, attention, and overall cognitive remediation. Pt received sitting in w/c initially reporting being on "probation." Pt oriented to person and place with min questioning cues. Pt oriented to year, however required total assist for month. Completed sit<>stand at sink 3x with  min-mod assist and max cues for use of mirror as visual feedback for postural control. Engaged in new learning task of card game "war." Pt with 75% accuracy when identifying higher card (no face cards) via pointing. Pt demonstrated improved sustained attention to task and required min cues for turn taking. Pt demonstrated improved recall of new information as he verbalized and completed "tie breaker" with 100% accuracy following demonstration of this 5 turn prior. Pt returned to room and unable to recall name of game however did inform NT on who won game. Pt left in w/c with all needs in reach.  Therapy Documentation Precautions:  Precautions Precautions: Fall Precaution Comments: abdominal binder over peg, trach Required Braces or Orthoses: Other Brace/Splint Cervical Brace: Other (comment) Other Brace/Splint: Cervical Collar D/C'ed Restrictions Weight Bearing Restrictions: No Other Position/Activity Restrictions: WBAT LLE General:   Vital Signs: Therapy Vitals Pulse Rate: 58 Resp: 18 Patient Position (if appropriate): Sitting Oxygen Therapy SpO2: 100 % O2 Device: None (Room air) (Stoma) O2 Flow Rate (L/min): 5 L/min FiO2 (%): 28 % Pain: Pain Assessment Pain Assessment: No/denies pain  See FIM for current functional status  Therapy/Group: Individual Therapy  [name] 04/01/2014, 12:14 PM

## 2014-04-01 NOTE — Progress Notes (Addendum)
Stoma site looks well and clean. Stoma site covered with gauze. Pt is tolerating well and on room air. Vitals stable. RT will continue to monitor pt.

## 2014-04-01 NOTE — Progress Notes (Signed)
Physical Therapy Session Note  Patient Details  Name: Pual Cheadle MRN: [id number] Date of Birth: Aug 02, 1990  Today's Date: 04/01/2014 PT Individual Time: 8786-7672 PT Individual Time Calculation (min): 60 min   Short Term Goals: Week 1:  PT Short Term Goal 1 (Week 1): Patient will perform bed mobility with minA. PT Short Term Goal 2 (Week 1): Patient will perform functional transfers with maxA x1. PT Short Term Goal 3 (Week 1): Patient will ambulate 14' with +2 assist. PT Short Term Goal 4 (Week 1): Patient will negotiate 2 steps with B handrails and +2 assist.  Skilled Therapeutic Interventions/Progress Updates:    Pt received handed off from OT in rehab gym. Session focused on sustained attention, standing balance, seated balance. Worked on sit<>stands w/ STEDI lift, pt able to maintain standing for up to 11 seconds before needing to sit to high perch. Standing in STEDI w/ shooting basketball for dynamic standing balance, ModA to maintain standing while shooting basketball. Ball throwing while seated in w/c for dynamic seated balance. With cues to count out on each catch, pt displayed difficulty w/ selective attention of completing motor task (catching ball) with cognitive task (counting next number). Pt reporting fatigue at end of session. Pt transported back to room via w/c, transferred back to bed w/ STEDI lift and MinA. Pt moved sit>supine w/ ModA, left supine in bed w/ sitter present.   Therapy Documentation Precautions:  Precautions Precautions: Fall Precaution Comments: abdominal binder over peg, trach Required Braces or Orthoses: Other Brace/Splint Cervical Brace: Other (comment) Other Brace/Splint: Cervical Collar D/C'ed Restrictions Weight Bearing Restrictions: No Other Position/Activity Restrictions: WBAT LLE General:   Vital Signs: Therapy Vitals Temp: 98.2 F (36.8 C) Temp src: Oral Pulse Rate: 65 Resp: 18 BP: 125/73 mmHg Patient Position (if appropriate):  Sitting Oxygen Therapy SpO2: 100 % O2 Device: Trach collar O2 Flow Rate (L/min): 5 L/min FiO2 (%): 28 % Pain:   Mobility:   Locomotion :    Trunk/Postural Assessment :    Balance:   Exercises:   Other Treatments:    See FIM for current functional status  Therapy/Group: Individual Therapy  Hosie Spangle Hosie Spangle, PT, DPT 04/01/2014, 8:37 AM

## 2014-04-01 NOTE — Progress Notes (Signed)
Pt had 4 shiley cuffless and was decannulated per MD order and on room air. Pt tolerated well. SPO2 100%. RT will continue to monitor. RN notified.

## 2014-04-01 NOTE — Progress Notes (Signed)
Speech Language Pathology Daily Session Note  Patient Details  Name: Victor [name] MRN: [id number] Date of Birth: 1991/01/19  Today's Date: 04/01/2014 SLP Individual Time: 1007-1219 SLP Individual Time Calculation (min): 45 min  Short Term Goals: Week 1: SLP Short Term Goal 1 (Week 1): Patient will tolerate PMSV for 15 minutes while vitals remain WNL with [name]. SLP Short Term Goal 2 (Week 1): Patient will increase length of verbal expression to the phrase level with PMSV placed with Min cues for intemsity. SLP Short Term Goal 3 (Week 1): Patient with utilize external aids for recall of orientation information with Max multimodal cues. SLP Short Term Goal 4 (Week 1): Patient will verbalize 1 physical and 1 cognitive deficit with Max question cues. SLP Short Term Goal 5 (Week 1): Patient will consume trilas of ice chips with PMSV placed and initiate swallow sequence with Max multimodal cues. SLP Short Term Goal 6 (Week 1): Patient will attend to basic self-care tasks for 5 minutes with Max cues for redirections.  Skilled Therapeutic Interventions: Skilled treatment session focused on dysphagia and cognitive-linguistic goals. Upon arrival, patient was sitting upright in wheelchair with PMSV off. Student facilitated session with PMSV placement for 45 minutes with intermittent removal with no indication of breath stacking. Patient tolerated the PMSV without difficulty and all vitals remained WFL; therefore, recommend patient continue to wear PMSV during all waking hours with full supervision. Patient initially required Mod-Max A multimodal cues for orientation to place, time, situation, date, and his correct age; however, required Total A when asked to verbalize orientation x4 at the end of the session. Patient demonstrated language of confusion but demonstrated increased awareness of confusion with Max A multimodal cues. Student administered oral care via a suction toothbrush and patient consumed  trials of ice chips without overt s/s of aspiration. Patient also consumed trials of thin liquids and required Min A multimodal cues for appropriate lip closure and demonstrated a delayed swallow initiation and multiple swallows with cough x1 due to increased bolus size via cup. Patient left in wheelchair with sitter present and PMSV in place. Continue with current plan of care.   FIM:  Comprehension Comprehension Mode: Auditory Comprehension: 2-Understands basic 25 - 49% of the time/requires cueing 51 - 75% of the time Expression Expression Mode: Verbal Expression Assistive Devices: 6-Talk trach valve Expression: 3-Expresses basic 50 - 74% of the time/requires cueing 25 - 50% of the time. Needs to repeat parts of sentences. Social Interaction Social Interaction: 3-Interacts appropriately 50 - 74% of the time - May be physically or verbally inappropriate. Problem Solving Problem Solving: 1-Solves basic less than 25% of the time - needs direction nearly all the time or does not effectively solve problems and may need a restraint for safety Memory Memory: 1-Recognizes or recalls less than 25% of the time/requires cueing greater than 75% of the time FIM - Eating Eating Activity: 1: Helper feeds patient  Pain Pain Assessment Pain Assessment: No/denies pain (denies when asked, but then c/o L shoulder pain during activity) Pain Score: 0-No pain  Therapy/Group: Individual Therapy  Brad Mcgaughy 04/01/2014, 3:57 PM

## 2014-04-01 NOTE — Progress Notes (Signed)
Physical Therapy Session Note  Patient Details  Name: Victor [name] MRN: [id number] Date of Birth: [birth year]  Today's Date: 04/01/2014 PT Individual Time: 1345-1445 PT Individual Time Calculation (min): 60 min   Short Term Goals: Week 1:  PT Short Term Goal 1 (Week 1): Patient will perform bed mobility with minA. PT Short Term Goal 2 (Week 1): Patient will perform functional transfers with maxA x1. PT Short Term Goal 3 (Week 1): Patient will ambulate 8' with +2 assist. PT Short Term Goal 4 (Week 1): Patient will negotiate 2 steps with B handrails and +2 assist.  Skilled Therapeutic Interventions/Progress Updates:   Patient received sitting in wheelchair. Session focused on cognitive remediation and sit<>stand transfers. Patient oriented to self, year, place, and city. Patient disoriented to day, month, and situation. Patient performed several sit<>stand transfers with use of railing on stairs for R UE support. Patient requires min-modA of 2 helpers, one helper primarily assisting with maintaining anterior weight shift and second helper assisting with facilitating R foot placement and increased weight bearing through R LE. Patient performs several bouts of standing, longest lasting approx 3.5-4 min. In standing, decreased flexor withdrawal R LE/improved weight bearing.   Patient performed gait training/2 steps with +2 assist, significant extensor tone/trunk extension in standing. Patient left sitting in tilt-n-space wheelchair with seatbelt donned and all needs within reach; sitter present.  Therapy Documentation Precautions:  Precautions Precautions: Fall Precaution Comments: abdominal binder over peg, trach Restrictions Weight Bearing Restrictions: No Other Position/Activity Restrictions: WBAT LLE Vital Signs: Therapy Vitals Pulse Rate: 89 Patient Position (if appropriate): Sitting Oxygen Therapy SpO2: 100 % O2 Device: None (Room air) Pain: Pain Assessment Pain Assessment: No/denies  pain (denies when asked, but then c/o L shoulder pain during activity) Pain Score: 0-No pain Locomotion : Ambulation Ambulation/Gait Assistance: 1: +2 Total assist   See FIM for current functional status  Therapy/Group: Individual Therapy  [name] Joellyn Grandt, PT, DPT 04/01/2014, 2:57 PM
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
## 2014-04-01 NOTE — Progress Notes (Signed)
The skilled treatment note has been reviewed and SLP is in agreement.  Elektra Wartman, M.A., CCC-SLP  [phone]   

## 2014-04-02 ENCOUNTER — Inpatient Hospital Stay (HOSPITAL_COMMUNITY): Payer: Medicaid Other

## 2014-04-02 ENCOUNTER — Inpatient Hospital Stay (HOSPITAL_COMMUNITY): Payer: Medicaid Other | Admitting: *Deleted

## 2014-04-02 ENCOUNTER — Encounter (HOSPITAL_COMMUNITY): Payer: Self-pay

## 2014-04-02 ENCOUNTER — Inpatient Hospital Stay (HOSPITAL_COMMUNITY): Payer: Self-pay | Admitting: Rehabilitation

## 2014-04-02 ENCOUNTER — Inpatient Hospital Stay (HOSPITAL_COMMUNITY): Payer: Medicaid Other | Admitting: Physical Therapy

## 2014-04-02 ENCOUNTER — Inpatient Hospital Stay (HOSPITAL_COMMUNITY): Payer: Self-pay | Admitting: Speech Pathology

## 2014-04-02 ENCOUNTER — Encounter (HOSPITAL_COMMUNITY): Payer: Self-pay | Admitting: Speech Pathology

## 2014-04-02 MED ORDER — JEVITY 1.2 CAL PO LIQD
360.0000 mL | ORAL | Status: DC | PRN
  Filled 2014-04-02: qty 474

## 2014-04-02 NOTE — Progress Notes (Signed)
Physical Therapy Session Note  Patient Details  Name: Victor [name] MRN: [id number] Date of Birth: 06-09-1991  Today's Date: 04/02/2014 PT Co-Treatment Time: 1345 ([place] with OT (KP) [phone] PT Co-Treatment Time Calculation (min): 30 min  Short Term Goals: Week 1:  PT Short Term Goal 1 (Week 1): Patient will perform bed mobility with minA. PT Short Term Goal 2 (Week 1): Patient will perform functional transfers with maxA x1. PT Short Term Goal 3 (Week 1): Patient will ambulate 52' with +2 assist. PT Short Term Goal 4 (Week 1): Patient will negotiate 2 steps with B handrails and +2 assist.  Skilled Therapeutic Interventions/Progress Updates:    Patient received sitting in wheelchair. Co-treatment with OT to address functional mobility goals. Session focused on sit<>stand transfers, functional ambulation, and stair negotiation. Sit<>stands with min-modA of 2 helpers, patient requires decreased assistance when anterior weight shift facilitated.   Functional ambulation 20' x1, 45' x1, and 30' x1 with R UE around therapist's shoulders and L UE HHA; modA of 2 helpers. Patient initially demonstrates trunk extension, steppage gait, very narrow BOS with intermittent scissoring/stepping on his own feet, L lateral trunk lean. With each trial of ambulation, patient with improved fluidity of gait, increased gait speed, and decreased scissoring. Patient with one instance of L knee buckling requiring totalA +2 to remain standing.  Stair negotiation: B UE support on handrails, patient negotiates 6 steps, ascending forwards, descends backwards; mod-maxA x1 to facilitate upright posture/anterior weight shift secondary to trunk extension, minA x1 for L foot placement. Patient demonstrating significant limb apraxia during stair negotiation, affecting sequencing/motor planning with each step.  Patient left sitting in tilt-n-space, slightly reclined, seatbelt donned and all needs within reach; see OT note for UE  theract/NMR during session.  Therapy Documentation Precautions:  Precautions Precautions: Fall Precaution Comments: abdominal binder over peg, trach Restrictions Weight Bearing Restrictions: No Other Position/Activity Restrictions: WBAT LLE Pain: Pain Assessment Pain Assessment: No/denies pain Pain Score: 0-No pain Locomotion : Ambulation Ambulation/Gait Assistance: 1: +2 Total assist   See FIM for current functional status  Therapy/Group: Individual Therapy  Victor [name], PT, DPT 04/02/2014, 3:40 PM
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
## 2014-04-02 NOTE — Progress Notes (Signed)
Note/chart reviewed. Agree with note.   Jeremi Losito RD, LDN, CNSC [phone] Pager [phone] After Hours Pager   

## 2014-04-02 NOTE — Progress Notes (Signed)
Occupational Therapy Session Note  Patient Details  Name: General Wearing MRN: [id number] Date of Birth: Nov 22, 1990  Today's Date: 04/02/2014 OT Co-Treatment Time: 1415-1445 co-treatment with PT (BR) this session from 1345-1445. OT Co-Treatment Time Calculation (min): 30 min   Short Term Goals: Week 1:  OT Short Term Goal 1 (Week 1): Patient will initiate anterior weight shift in preparation for functoinal transfer with min cues  OT Short Term Goal 2 (Week 1): Patient will be oriented to x3 with mod cues OT Short Term Goal 3 (Week 1): Patient will demonstrate sustained attention to functional task for 45 seconds in controlled environment OT Short Term Goal 4 (Week 1): Patient will verbalize 2 deficits with mod cues  OT Short Term Goal 5 (Week 1): Patient will complete sit<>stand during self-care task with max assist +1    Skilled Therapeutic Interventions/Progress Updates:  Upon entering the room, patient sitting in wheelchair with c/o pain. Co-treatment with PT to address functional mobility goals.   Pt attempted to untie shoes with verbal cues to utilize bilateral hands. Assist required secondary to knot in shoe laces. Pt crossed R LE to L knee in order to engage in functional coordination activity of tying shoe laces. It is unclear if pt was able to perform this activity prior to admission. Pt able to perform 1st step of crossing laces over each other and pulling tight. OT attempted closed chain task for remaining step and pt performed with verbal cues from therapist for technique. Pt performed alternating hand movements with decreased speed. Reverse alternating hand movements with delay on L hand engaging in activity. Pt performing 5 reps of digit isolation and pairs with 4th L hand digit noted to be weakest in set. Abduction/adduction of digits of L hand x 5 reps in order to increase [facility] as well. Pt learns best this session by first practicing with R hand and then engaging in task with L hand. Pt  sat edge of mat with supervision to steady assist while engaging in horse shoe type activity in order to facilitate forward leans for sit to stands as well dynamic sitting balance.   Patient left sitting in tilt-n-space, slightly reclined, seatbelt donned and all needs within reach; see PT note for gait training, functional transfers, and stair negotiation requiring + 2 for safety.   Therapy Documentation Precautions:  Precautions Precautions: Fall Precaution Comments: abdominal binder over peg, trach Required Braces or Orthoses: Other Brace/Splint Cervical Brace: Other (comment) Other Brace/Splint: Cervical Collar D/C'ed Restrictions Weight Bearing Restrictions: No Other Position/Activity Restrictions: WBAT LLE  See FIM for current functional status  Therapy/Group: Co-Treatment  Pittman, Jlyn Cerros L 04/02/2014, 2:52 PM
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
## 2014-04-02 NOTE — Progress Notes (Signed)
Physical Therapy Session Note  Patient Details  Name: Victor [name] MRN: [id number] Date of Birth: Feb 19, 1991  Today's Date: 04/02/2014 PT Individual Time: 1500-1600 +2 PT Tech Kelvin Odemelam PT Individual Time Calculation (min): 60 min   Short Term Goals: Week 1:  PT Short Term Goal 1 (Week 1): Patient will perform bed mobility with minA. PT Short Term Goal 2 (Week 1): Patient will perform functional transfers with maxA x1. PT Short Term Goal 3 (Week 1): Patient will ambulate 14' with +2 assist. PT Short Term Goal 4 (Week 1): Patient will negotiate 2 steps with B handrails and +2 assist.  Skilled Therapeutic Interventions/Progress Updates:    Therapeutic Activity: PT instructs pt in sit to stand with wall rail req +2 assist - on one rep pt's L knee buckles. PT instructs pt in stand-pivot transfer w/c to/from mat to each direction req +2 assist; pt does better with transfer to the L than to the R.  Gait Training: PT instructs pt in ambulation x 10' with +2 assist hand hold with gait belt - steppage gait bilateral and narrow BOS noted.   Neuromuscular Reeducation: PT instructs pt in seated balance throwing/catching football with R UE, throwing stress ball with L UE multiple reps, and catching the football/stressball with B hands req CGA due to impulsiveness reaching for ball if he misses the catch. PT instructs pt in standing balance, incorporating B UE activity, of shooting a basketball at a goal multiple reps, req +2 for standing balance with L knee blocked for safety, but no buckling during this activity noted (pt only stood for 1 to 2 shots at a time).   Pt demonstrates significant ataxic gait and poor balance/body awareness during sit to stand, demonstrating retropulsive trunk posture and impulsiveness, standing prior to feet being underneath him (or too far underneath him). Pt's seated balance shows movements in synergy, evidenced by posterior pelvic tilt with knee extension, and  difficulty but ability to move out of synergy, evidenced by pt being able to perform isolated dorsiflexion bilaterally when instructed. Pt appeared to thoroughly enjoy the football tossing activity, and asked if he could do it again next time. Continue per PT POC.   Therapy Documentation Precautions:  Precautions Precautions: Fall Precaution Comments: abdominal binder over peg, trach Required Braces or Orthoses: Other Brace/Splint Cervical Brace: Other (comment) Other Brace/Splint: Cervical Collar D/C'ed Restrictions Weight Bearing Restrictions: No Other Position/Activity Restrictions: WBAT LLE Pain: Pain Assessment Pain Assessment: No/denies pain Pain Score: 0-No pain    See FIM for current functional status  Therapy/Group: Individual Therapy, +2 PT Tech Kelvin Odemelam  [facility] M 04/02/2014, 3:52 PM

## 2014-04-02 NOTE — Progress Notes (Signed)
Physical Therapy Note  Patient Details  Name: Victor [name] MRN: [id number] Date of Birth: [birth year] Today's Date: 04/02/2014    Pt missed 30 mins of skilled PT this morning due to being off unit for barium swallow study.     Vista Deck 04/02/2014, 9:54 AM
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
## 2014-04-02 NOTE — Progress Notes (Signed)
Patient verbalize that he was "having bad thoughts". When asked what bad thoughts he was having, he stated "of hanging myself". When asked what was his plan or how would he carry it out, his response was incoherent. Notified Marissa Nestle, PA given order for sitter to monitor patient. Will continue with the plan of care. [place]

## 2014-04-03 ENCOUNTER — Inpatient Hospital Stay (HOSPITAL_COMMUNITY): Payer: Medicaid Other

## 2014-04-03 ENCOUNTER — Inpatient Hospital Stay (HOSPITAL_COMMUNITY): Payer: Medicaid Other | Admitting: Speech Pathology

## 2014-04-03 ENCOUNTER — Inpatient Hospital Stay (HOSPITAL_COMMUNITY): Payer: Medicaid Other | Admitting: Rehabilitation

## 2014-04-03 ENCOUNTER — Inpatient Hospital Stay (HOSPITAL_COMMUNITY): Payer: Medicaid Other | Admitting: Occupational Therapy

## 2014-04-03 MED ORDER — QUETIAPINE FUMARATE 50 MG PO TABS
250.0000 mg | ORAL_TABLET | Freq: Two times a day (BID) | ORAL | Status: DC
  Administered 2014-04-03 – 2014-04-25 (×45): 250 mg
  Filled 2014-04-03 (×50): qty 1

## 2014-04-03 MED ORDER — VALPROIC ACID 250 MG/5ML PO SYRP
500.0000 mg | ORAL_SOLUTION | Freq: Two times a day (BID) | ORAL | Status: DC
  Administered 2014-04-03 – 2014-04-25 (×45): 500 mg
  Filled 2014-04-03 (×52): qty 10
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
## 2014-04-03 NOTE — Progress Notes (Signed)
Speech Language Pathology Weekly Progress and Session Note  Patient Details  Name: Victor [name] MRN: [id number] Date of Birth: [birth year]  Beginning of progress report period: March 27, 2014 End of progress report period: April 03, 2014  Today's Date: 04/03/2014 SLP Individual Time: 1000-1045 SLP Individual Time Calculation (min): 45 min  Short Term Goals: Week 1: SLP Short Term Goal 1 (Week 1): Patient will tolerate PMSV for 15 minutes while vitals remain WNL with [name]. SLP Short Term Goal 1 - Progress (Week 1): Met SLP Short Term Goal 2 (Week 1): Patient will increase length of verbal expression to the phrase level with PMSV placed with Min cues for intemsity. SLP Short Term Goal 2 - Progress (Week 1): Met SLP Short Term Goal 3 (Week 1): Patient with utilize external aids for recall of orientation information with Max multimodal cues. SLP Short Term Goal 3 - Progress (Week 1): Not met SLP Short Term Goal 4 (Week 1): Patient will verbalize 1 physical and 1 cognitive deficit with Max question cues. SLP Short Term Goal 4 - Progress (Week 1): Not met SLP Short Term Goal 5 (Week 1): Patient will consume trilas of ice chips with PMSV placed and initiate swallow sequence with Max multimodal cues. SLP Short Term Goal 5 - Progress (Week 1): Met SLP Short Term Goal 6 (Week 1): Patient will attend to basic self-care tasks for 5 minutes with Max cues for redirections. SLP Short Term Goal 6 - Progress (Week 1): Met    New Short Term Goals: Week 2: SLP Short Term Goal 1 (Week 2): Patient will utilize swallowing compensatory strategies with Min A multimodal cueing with current diet to minimize overt s/s of aspiration. SLP Short Term Goal 2 (Week 2): Patient with utilize external aids for recall of orientation information with Max multimodal cues. SLP Short Term Goal 3 (Week 2): Patient will verbalize 1 physical and 1 cognitive deficit with Max A question cues. SLP Short Term Goal 4 (Week  2): Patient will attend to basic self-care tasks for 15 minutes with Mod cues for redirections. SLP Short Term Goal 5 (Week 2): Patient will demonstrate functional problem solving for basic and familiar tasks with Max A.  Weekly Progress Updates: Patient has made functional gains and has met 4 of 6 STG's this reporting period due to increased sustained attention, verbal expression and swallowing function. Currently, patient requires overall Total-Max A to utilize external aids for recall of orientation information and continues to demonstrate perseveration in regard to orientation to situation and place. Patient also requires Total-Max A for sustained attention, intellectual awareness, problem solving, working memory and Surveyor, mining. Patient was decannulated on 09/08 and is currently consuming Dys. 1 textures with nectar-thick liquids without overt s/s of aspiration and overall Mod A multimodal cues to use swallowing compensatory strategies of small bites and sips. Patient/family education ongoing and patient would benefit from continued skilled SLP intervention to maximize his cognitive-linguistic and swallowing function and overall functional independence prior to discharge.    Intensity: Minumum of 1-2 x/day, 30 to 90 minutes Frequency: 5 out of 7 days Duration/Length of Stay: 04/22/14 Treatment/Interventions: Cognitive remediation/compensation;Cueing hierarchy;Dysphagia/aspiration precaution training;Environmental controls;Functional tasks;Internal/external aids;Patient/family education;Therapeutic Activities   Daily Session  Skilled Therapeutic Interventions: Skilled treatment session focused on cognitive-linguistic and dysphagia goals. Patient consumed Dys. 1 textures with nectar-thick liquids with Mod A multimodal cues to use swallowing compensatory strategies of small bites and sips. Patient was intermittently oriented to place but required Total A multimodal cues to orient  to self in  regards to age, situation, and time. Patient also demonstrated verbal perseveration intermittently throughout the session. Student also facilitated session by providing Max A multimodal cues for initiation and problem solving during a basic card game. Continue with current plan of care.        FIM:  Comprehension Comprehension Mode: Auditory Comprehension: 2-Understands basic 25 - 49% of the time/requires cueing 51 - 75% of the time Expression Expression Mode: Verbal Expression: 4-Expresses basic 75 - 89% of the time/requires cueing 10 - 24% of the time. Needs helper to occlude trach/needs to repeat words. Social Interaction Social Interaction: 3-Interacts appropriately 50 - 74% of the time - May be physically or verbally inappropriate. Problem Solving Problem Solving: 2-Solves basic 25 - 49% of the time - needs direction more than half the time to initiate, plan or complete simple activities Memory Memory: 1-Recognizes or recalls less than 25% of the time/requires cueing greater than 75% of the time FIM - Eating Eating Activity: 5: Needs verbal cues/supervision;5: Set-up assist for open containers  Pain No/Denies Pain  Therapy/Group: Individual Therapy  Victor [name] 04/03/2014, 3:45 PM

## 2014-04-03 NOTE — Progress Notes (Signed)
The skilled treatment note has been reviewed and SLP is in agreement.  Reinhardt Licausi, M.A., CCC-SLP  [phone]   
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
## 2014-04-03 NOTE — Progress Notes (Signed)
Physical Therapy Session Note  Patient Details  Name: Victor [name] MRN: [id number] Date of Birth: 12-20-1990  Today's Date: 04/03/2014 PT Individual Time: 1115-1200 PT Individual Time Calculation (min): 45 min   Short Term Goals: Week 1:  PT Short Term Goal 1 (Week 1): Patient will perform bed mobility with minA. PT Short Term Goal 2 (Week 1): Patient will perform functional transfers with maxA x1. PT Short Term Goal 3 (Week 1): Patient will ambulate 50' with +2 assist. PT Short Term Goal 4 (Week 1): Patient will negotiate 2 steps with B handrails and +2 assist.  Skilled Therapeutic Interventions/Progress Updates:  Patient sitting in wheelchair upon entering room. Patient pushed in wheelchair to gym. Patient ambulated with +2 HHA 40 feet x 1 and 50 feet x 1. Patient has steppage gait with bilateral LE's and tends to ambulate with flexed trunk and hips. Patient much improved on the second walk with verbal cueing to stand tall. Patient used Nustep x 7.5 minutes to work on strength and coordination. Patient required manual assist to maintain grip with left hand. Patient max assist to transfer on and off seat. Patient propelled wheelchair using bilateral LE's 150 feet with occasional min assist for directional movements. Patient left in room with sitter.   Therapy Documentation Precautions:  Precautions Precautions: Fall Precaution Comments: abdominal binder over peg, R crani in abdomen Required Braces or Orthoses: Other Brace/Splint Cervical Brace: Other (comment) Other Brace/Splint: Cervical Collar D/C'ed Restrictions Weight Bearing Restrictions: No Other Position/Activity Restrictions: WBAT LLE Pain: Patient reports leg pain at a 9 or 10. No grimacing or indication of pain with touch or weight bearing.   Locomotion : Ambulation Ambulation/Gait Assistance: 1: +2 Total assist Wheelchair Mobility Distance: 150   See FIM for current functional status  Therapy/Group: Individual  Therapy  Arelia Longest M 04/03/2014, 12:17 PM

## 2014-04-03 NOTE — Progress Notes (Signed)
Physical Therapy Session Note  Patient Details  Name: Victor [name] MRN: [id number] Date of Birth: February 11, 1991  Today's Date: 04/03/2014 PT Individual Time: 1300-1400 PT Individual Time Calculation (min): 60 min   Short Term Goals: Week 2:     Skilled Therapeutic Interventions/Progress Updates:   Pt received sitting in w/c in room, agreeable to therapy session.  Assisted pt to/from therapy gym at total A level.  Skilled session focused on NMR through dynamic sitting balance tasks while performing cognitive tasks, sustained attention, memory, problem solving and mental flexibility, as well as gait and stairs.  Pt with no LOB during sitting task, however did require max to total A cues to attend to task due to pt easily externally distracted.  Also requires max verbal cues to use LUE during catching task.  Transitioned to working on connect four game.  Pt unable to recall if he had played game, but willing to participate.  Pt able to play game appropriately with return demonstration and min cues.  Ambulated x 90' with +2 assist (B HHA, pt assist 30-40%).  Note he continues to demonstrate trunk extensor pattern during standing and gait, therefore therapist provided assist from behind pt at hips for increased flex.  Also note that steppage gait improved with continued practice.  Transitioned to standing in hallway x 3 reps with use of handrail at min to mod A and max verbal cues to remain standing.  Pt would sit abruptly and state "I'm tired."  Ended session with another rep of gait x 25' in hallway using R handrail for UE support with therapist assisting as above at mod/max A level (+2 for chair follow).  Continued to note improvement in gait pattern with repetition.  Also performed stairs up/down 5, 4" and 6" steps with B handrails in alternating pattern, as pt unable to follow cues for step to pattern.  Assisted pt back to room and left in tilt in space tilted with QRB donned and sitter in room for next  session.    Therapy Documentation Precautions:  Precautions Precautions: Fall Precaution Comments: abdominal binder over peg, R crani in abdomen Required Braces or Orthoses: Other Brace/Splint Cervical Brace: Other (comment) Other Brace/Splint: Cervical Collar D/C'ed Restrictions Weight Bearing Restrictions: No Other Position/Activity Restrictions: WBAT LLE   Vital Signs: Therapy Vitals Temp: 97.6 F (36.4 C) Temp src: Oral Pulse Rate: 60 BP: 114/72 mmHg Patient Position (if appropriate): Sitting Oxygen Therapy SpO2: 98 % O2 Device: None (Room air) Pain: Pt with intermittent c/o LE pain, allowed rest breaks.    Locomotion : Ambulation Ambulation/Gait Assistance: 1: +2 Total assist Wheelchair Mobility Distance: 150  See FIM for current functional status  Therapy/Group: Individual Therapy  Vista Deck 04/03/2014, 4:10 PM
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
## 2014-04-04 ENCOUNTER — Inpatient Hospital Stay (HOSPITAL_COMMUNITY): Payer: Medicaid Other | Admitting: *Deleted

## 2014-04-04 ENCOUNTER — Inpatient Hospital Stay (HOSPITAL_COMMUNITY): Payer: Medicaid Other

## 2014-04-04 ENCOUNTER — Inpatient Hospital Stay (HOSPITAL_COMMUNITY): Payer: Medicaid Other | Admitting: Speech Pathology

## 2014-04-04 ENCOUNTER — Inpatient Hospital Stay (HOSPITAL_COMMUNITY): Payer: Self-pay | Admitting: *Deleted

## 2014-04-04 DIAGNOSIS — W19XXXA Unspecified fall, initial encounter: Secondary | ICD-10-CM

## 2014-04-04 DIAGNOSIS — S069X9A Unspecified intracranial injury with loss of consciousness of unspecified duration, initial encounter: Secondary | ICD-10-CM

## 2014-04-04 DIAGNOSIS — S069XAA Unspecified intracranial injury with loss of consciousness status unknown, initial encounter: Secondary | ICD-10-CM

## 2014-04-04 MED ORDER — GLUCERNA SHAKE PO LIQD
237.0000 mL | Freq: Two times a day (BID) | ORAL | Status: DC
  Administered 2014-04-04 – 2014-04-14 (×18): 237 mL via ORAL

## 2014-04-04 MED ORDER — RESOURCE THICKENUP CLEAR PO POWD
ORAL | Status: DC | PRN
  Filled 2014-04-04: qty 125

## 2014-04-04 MED ORDER — FREE WATER: 200.0000 mL | Status: DC | PRN

## 2014-04-04 NOTE — Progress Notes (Signed)
Note/Chart reviewed.  Meghanne Pletz Barnett RD, LDN Inpatient Clinical Dietitian Pager: [phone] After Hours Pager: [phone]  
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
## 2014-04-04 NOTE — Progress Notes (Signed)
Physical Therapy Weekly Progress Note  Patient Details  Name: Victor [name] MRN: [id number] Date of Birth: December 31, 1990  Beginning of progress report period: March 27, 2014 End of progress report period: April 04, 2014  Today's Date: 04/04/2014 PT Individual Time: 1000-1100 and 1500-1600 PT Individual Time Calculation (min): 60 min and 60 min  Patient has met 3 of 4 short term goals.  Patient has made excellent progress physically during first reporting period on rehab. Patient is able to perform functional transfers with maxA of one helper, functional ambulation for 30-90' with +2 assist, and 5-6 stairs with B handrails and +2 assist. Patient continues to demonstrate significant extensor tone during movements, steppage/ataxic gait and stair negotiation, and limb apraxia (especially during stair negotiation). Currently, patient requires overall Total-Max A to utilize external aids for recall of orientation information and continues to demonstrate perseveration in regard to orientation to situation and place. Patient also requires Total-Max A for sustained attention, intellectual awareness, problem solving, working memory, and Surveyor, mining. Patient is currently demonstrating behaviors consistent with [place] Level V.  Patient continues to demonstrate the following deficits: apraxia, L hemiplegia, decreased activity tolerance, poor balance and balance strategies in sitting and standing, decreased postural control during standing and transitional movements, decreased funcitonal use of all 4 extremities due to L hemiplegia and limb apraxia, decreased sustained attention, decreased safety awareness and awareness of deficits, decreased coordination, and decreased knowledge of precautions, and therefore will continue to benefit from skilled PT intervention to enhance overall performance with activity tolerance, balance, postural control, ability to compensate for deficits, functional use of  right upper  extremity, right lower extremity, left upper extremity and left lower extremity, attention, awareness, coordination and knowledge of precautions.  Patient progressing toward long term goals..  Continue plan of care.  PT Short Term Goals Week 1:  PT Short Term Goal 1 (Week 1): Patient will perform bed mobility with minA. PT Short Term Goal 1 - Progress (Week 1): Met PT Short Term Goal 2 (Week 1): Patient will perform functional transfers with maxA x1. PT Short Term Goal 2 - Progress (Week 1): Met PT Short Term Goal 3 (Week 1): Patient will ambulate 43' with +2 assist. PT Short Term Goal 3 - Progress (Week 1): Met PT Short Term Goal 4 (Week 1): Patient will negotiate 2 steps with B handrails and +2 assist. PT Short Term Goal 4 - Progress (Week 1): Met Week 2:  PT Short Term Goal 1 (Week 2): Patient will perform bed mobility with supervision on flat bed. PT Short Term Goal 2 (Week 2): Patient will perform functional transfers with modA x1. PT Short Term Goal 3 (Week 2): Patient will perform gait training 33' x1 with maxA x1. PT Short Term Goal 4 (Week 2): Patient will negotiate 5 steps with B handrails and maxA x1.  Skilled Therapeutic Interventions/Progress Updates:    AM Session: Patient received sitting in wheelchair. Session focused on initiation and sustained attention to task during functional mobility activities. Patient performed several sit<>stands with use of UEs on handrail or wheelchair for support, patient requires min-maxA for activity due to extensor tone, impulsivity, and decreased control from stand>sit. Patient benefits from facilitation to maintain anterior weight shift throughout movement from sit>stand and facilitation to increase weight bearing through L LE. Functional ambulation 33' x1 and 83' x1 with modA of 2 helpers (R UE around therapist's shoulders and L HHA). Patient continues to demonstrate trunk extension, steppage gait, very narrow BOS, L lateral trunk lean. With  each  trial of ambulation, patient with improved fluidity of gait, increased gait speed, normalized BOS, and midline truncal alignment.  Wheelchair mobility 200' x1 back to room with B LEs and supervision, max cues for safe negotiation of obstacles. Attempted timed toileting with patient, requiring +2 for transfer wheelchair<>toilet and for management clothing. Patient unable to urinate or have bowel movement. Patient left sitting in wheelchair with seatbelt donned, all needs within reach, and sitter present.  PM Session: Patient received sitting in wheelchair. Session focused on initiation and sustained attention to task during functional mobility and balance activities. Functional ambulation 54' x1 with maxA of 2 helpers (R UE around therapist's shoulders and L HHA). Patient continues to demonstrate trunk extension, steppage gait, very narrow BOS, L lateral trunk lean. Sitting unsupported without UE/LE support shooting baskets and counting made shots, patient requires mod cues for counting and minA for balance overall.  Wheelchair mobility 200' x2 room<>gym with B LEs and supervision, max cues for safe negotiation of obstacles. Patient left sitting in wheelchair with seatbelt donned and all needs within reach.  Therapy Documentation Precautions:  Precautions Precautions: Fall Precaution Comments: abdominal binder over peg, R crani in abdomen Required Braces or Orthoses: Other Brace/Splint Cervical Brace: Other (comment) Other Brace/Splint: Cervical Collar D/C'ed Restrictions Weight Bearing Restrictions: No Other Position/Activity Restrictions: WBAT LLE Pain: Pain Assessment Pain Assessment: No/denies pain Pain Score: 0-No pain Locomotion : Ambulation Ambulation/Gait Assistance: 1: +2 Total assist Wheelchair Mobility Distance: 200   See FIM for current functional status  Therapy/Group: Individual Therapy  Lillia Abed. , PT, DPT 04/04/2014, 12:15 PM

## 2014-04-04 NOTE — Progress Notes (Signed)
Speech Language Pathology Daily Session Note  Patient Details  Name: Victor [name] MRN: [id number] Date of Birth: 29-May-1991  Today's Date: 04/04/2014 SLP Individual Time: 0900-1000 SLP Individual Time Calculation (min): 60 min  Short Term Goals: Week 2: SLP Short Term Goal 1 (Week 2): Patient will utilize swallowing compensatory strategies with Min A multimodal cueing with current diet to minimize overt s/s of aspiration. SLP Short Term Goal 2 (Week 2): Patient with utilize external aids for recall of orientation information with Max multimodal cues. SLP Short Term Goal 3 (Week 2): Patient will verbalize 1 physical and 1 cognitive deficit with Max A question cues. SLP Short Term Goal 4 (Week 2): Patient will attend to basic self-care tasks for 15 minutes with Mod cues for redirections. SLP Short Term Goal 5 (Week 2): Patient will demonstrate functional problem solving for basic and familiar tasks with Max A.  Skilled Therapeutic Interventions: Skilled treatment session focused on cognitive-linguistic and dysphagia goals. SLP facilitated session by providing supervision verbal cues and extra time for set-up with snack of Dys. 1 textures and nectar-thick liquids. Patient required Mod A multimodal cues to utilize swallowing compensatory strategies of small bites and sips but did not demonstrate any overt s/s of aspiration. Patient was intermittently oriented to place but required Total A multimodal cues to orient to situation and time. Patient independently verbalized need to use the bathroom, however, pt was incontinent of bowel and required Max verbal and tactile cues for functional problem solving with self-care tasks.  Patient also demonstrated intermittent verbal perseveration and required Mod verbal cues for redirection. Continue with current plan of care.    FIM:  Comprehension Comprehension Mode: Auditory Comprehension: 2-Understands basic 25 - 49% of the time/requires cueing 51 - 75% of the  time Expression Expression Mode: Verbal Expression: 4-Expresses basic 75 - 89% of the time/requires cueing 10 - 24% of the time. Needs helper to occlude trach/needs to repeat words. Social Interaction Social Interaction: 3-Interacts appropriately 50 - 74% of the time - May be physically or verbally inappropriate. Problem Solving Problem Solving: 2-Solves basic 25 - 49% of the time - needs direction more than half the time to initiate, plan or complete simple activities Memory Memory: 1-Recognizes or recalls less than 25% of the time/requires cueing greater than 75% of the time FIM - Eating Eating Activity: 5: Supervision/cues  Pain Pain Assessment Pain Assessment: No/denies pain  Therapy/Group: Individual Therapy  Victor [name] 04/04/2014, 11:59 AM
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
## 2014-04-05 ENCOUNTER — Inpatient Hospital Stay (HOSPITAL_COMMUNITY): Payer: Self-pay

## 2014-04-05 ENCOUNTER — Inpatient Hospital Stay (HOSPITAL_COMMUNITY): Payer: Self-pay | Admitting: Occupational Therapy

## 2014-04-05 DIAGNOSIS — F200 Paranoid schizophrenia: Secondary | ICD-10-CM

## 2014-04-05 DIAGNOSIS — IMO0001 Reserved for inherently not codable concepts without codable children: Secondary | ICD-10-CM

## 2014-04-05 DIAGNOSIS — S069X9S Unspecified intracranial injury with loss of consciousness of unspecified duration, sequela: Secondary | ICD-10-CM

## 2014-04-05 DIAGNOSIS — S279XXS Injury of unspecified intrathoracic organ, sequela: Secondary | ICD-10-CM

## 2014-04-05 DIAGNOSIS — S069XAS Unspecified intracranial injury with loss of consciousness status unknown, sequela: Secondary | ICD-10-CM

## 2014-04-05 DIAGNOSIS — G5790 Unspecified mononeuropathy of unspecified lower limb: Secondary | ICD-10-CM

## 2014-04-05 LAB — GLUCOSE, CAPILLARY: Glucose-Capillary: 92 mg/dL (ref 70–99)
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
## 2014-04-05 NOTE — Progress Notes (Signed)
Physical Therapy Session Note  Patient Details  Name: Victor [name] MRN: [id number] Date of Birth: 06/05/1991  Today's Date: 04/05/2014 PT Individual Time: 1015-1100 PT Individual Time Calculation (min): 45 min   Short Term Goals: Week 2:  PT Short Term Goal 1 (Week 2): Patient will perform bed mobility with supervision on flat bed. PT Short Term Goal 2 (Week 2): Patient will perform functional transfers with modA x1. PT Short Term Goal 3 (Week 2): Patient will perform gait training 35' x1 with maxA x1. PT Short Term Goal 4 (Week 2): Patient will negotiate 5 steps with B handrails and maxA x1.  Skilled Therapeutic Interventions/Progress Updates:  1:1. Pt received semi-reclined in bed, ready for therapy. Focus this session on orientation, sustained attention to functional tasks and NMR. Pt req max cues for sustained attention to functional tasks due to internal distraction as well as max cues for orientation to time, situation and place. Pt spontaneously stating 1x at end of session, "so this is the hospital?" Pt req min A for t/f sup>sit EOB w/ use of hospital bed functions, max A for t/f bed>w/c<>toilet w/ use of bobath technique due to extensor tone. Pt transferred to toilet due to noted onset of bowel incontinence. Pt req Ax2persons for cleanup and clothing management, Mod Ax1person to remain standing w/ use of L grab bar w/ cues for anterior lean and total A from second person for cleanup/clothing. Pt propelled w/c 300' at end of session for emphasis on coordination and reciprocal movement of B LE. Overall, pt req supervision for task w/ cues for pace, attention and obstacle negotiation. Pt left sitting in tilt-in-space w/c at end of session w/ all needs in reach, quick release belt in place and sitter in room.   Therapy Documentation Precautions:  Precautions Precautions: Fall Precaution Comments: abdominal binder over peg, R crani in abdomen Required Braces or Orthoses: Other  Brace/Splint Cervical Brace: Other (comment) Other Brace/Splint: Cervical Collar D/C'ed Restrictions Weight Bearing Restrictions: No Other Position/Activity Restrictions: WBAT LLE  See FIM for current functional status  Therapy/Group: Individual Therapy  Denzil Hughes 04/05/2014, 12:12 PM
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
## 2014-04-06 ENCOUNTER — Inpatient Hospital Stay (HOSPITAL_COMMUNITY): Payer: Medicaid Other

## 2014-04-06 DIAGNOSIS — S0291XS Unspecified fracture of skull, sequela: Secondary | ICD-10-CM

## 2014-04-06 DIAGNOSIS — S0292XS Unspecified fracture of facial bones, sequela: Secondary | ICD-10-CM

## 2014-04-06 DIAGNOSIS — I10 Essential (primary) hypertension: Secondary | ICD-10-CM

## 2014-04-06 DIAGNOSIS — R45851 Suicidal ideations: Secondary | ICD-10-CM
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
## 2014-04-06 NOTE — Progress Notes (Signed)
Occupational Therapy Session Note  Patient Details  Name: Victor [name] MRN: [id number] Date of Birth: 01/26/1991  Today's Date: 04/06/2014 OT Individual Time: 2683-4196 OT Individual Time Calculation (min): 60 min    Short Term Goals: Week 2:  OT Short Term Goal 1 (Week 2): Patient will sustain attention to functional task for 2 min with min cues OT Short Term Goal 2 (Week 2): Patient will complete sit<>stand during self-care task with mod assist OT Short Term Goal 3 (Week 2): Patient will complete toilet transfer with mod assist and mod cues for anterior weight shift OT Short Term Goal 4 (Week 2): Patient will locate 3/4 self-care items from field of 3.  Skilled Therapeutic Interventions/Progress Updates:    Pt seen for ADL retraining with focus on attention, standing balance, functional mobility, and recall. Pt received sitting in recliner chair and eager for shower this AM. Ambulated with max assist +2 (3 musketeer technique) to bathroom to complete shower. Pt with improved sequencing, requiring min cues for washing all body parts. Pt required min assist for standing balance during buttocks hygiene with less extensor tone noted as pt holding onto grab bars. Completed dressing and oral care from w/c level with min assist for standing balance to manage brief around waist. Pt applied lotion with min cues to use LUE. Pt required max cues for attention to tasks this AM as he was perseverating on "probation", going to his moms, and getting clean clothes. Pt reported being at hospital, asked therapist multiple times when he was "going back to the hospital." Pt required total assist for orientation to time and situation.   Therapy Documentation Precautions:  Precautions Precautions: Fall Precaution Comments: abdominal binder over peg, R crani in abdomen Required Braces or Orthoses: Other Brace/Splint Cervical Brace: Other (comment) Other Brace/Splint: Cervical Collar D/C'ed Restrictions Weight  Bearing Restrictions: No Other Position/Activity Restrictions: WBAT LLE General:   Vital Signs:   Pain: Pt reporting 9.5 pain in leg and neck (inconsistent with location). RN notified.   See FIM for current functional status  Therapy/Group: Individual Therapy  [name] 04/06/2014, 12:33 PM

## 2014-04-07 ENCOUNTER — Inpatient Hospital Stay (HOSPITAL_COMMUNITY): Payer: Medicaid Other | Admitting: Speech Pathology

## 2014-04-07 ENCOUNTER — Inpatient Hospital Stay (HOSPITAL_COMMUNITY): Payer: Medicaid Other | Admitting: *Deleted

## 2014-04-07 ENCOUNTER — Inpatient Hospital Stay (HOSPITAL_COMMUNITY): Payer: Medicaid Other

## 2014-04-07 DIAGNOSIS — S069X9A Unspecified intracranial injury with loss of consciousness of unspecified duration, initial encounter: Secondary | ICD-10-CM

## 2014-04-07 DIAGNOSIS — S069XAA Unspecified intracranial injury with loss of consciousness status unknown, initial encounter: Secondary | ICD-10-CM

## 2014-04-07 DIAGNOSIS — W19XXXA Unspecified fall, initial encounter: Secondary | ICD-10-CM

## 2014-04-07 NOTE — Plan of Care (Signed)
Problem: RH BLADDER ELIMINATION Goal: RH STG MANAGE BLADDER WITH ASSISTANCE STG Manage Bladder With total Assistance  Pt wears condom cath   Problem: RH SAFETY Goal: RH STG ADHERE TO SAFETY PRECAUTIONS W/ASSISTANCE/DEVICE STG Adhere to Safety Precautions With total Assistance/Device.  sitter

## 2014-04-07 NOTE — Progress Notes (Signed)
Speech Language Pathology Daily Session Note  Patient Details  Name: Victor [name] MRN: [id number] Date of Birth: [birth year]  Today's Date: 04/07/2014 SLP Individual Time: 0900-1000 SLP Individual Time Calculation (min): 60 min  Short Term Goals: Week 2: SLP Short Term Goal 1 (Week 2): Patient will utilize swallowing compensatory strategies with Min A multimodal cueing with current diet to minimize overt s/s of aspiration. SLP Short Term Goal 2 (Week 2): Patient with utilize external aids for recall of orientation information with Max multimodal cues. SLP Short Term Goal 3 (Week 2): Patient will verbalize 1 physical and 1 cognitive deficit with Max A question cues. SLP Short Term Goal 4 (Week 2): Patient will attend to basic self-care tasks for 15 minutes with Mod cues for redirections. SLP Short Term Goal 5 (Week 2): Patient will demonstrate functional problem solving for basic and familiar tasks with Max A.  Skilled Therapeutic Interventions: Skilled treatment session focused on cognitive-linguistic and dysphagia goals. SLP facilitated session by providing Max A verbal cues and extra time for set-up with snack of Dys. 1 textures and nectar-thick liquids. Patient required Mod A multimodal cues to utilize swallowing compensatory strategies of small bites and sips and demonstrated a wet vocal quality X 1 and required total A multimodal cues to self-monitor and correct with a cued throat clear. Patient was intermittently oriented to place but required Total A multimodal cues to orient to situation and time. Patient was incontinent of bowel and required Max verbal and tactile cues for functional problem solving with self-care tasks.  Patient also demonstrated intermittent verbal perseveration and required Max verbal cues for redirection. Continue with current plan of care.   FIM:  Comprehension Comprehension Mode: Auditory Comprehension: 2-Understands basic 25 - 49% of the time/requires cueing 51 - 75%  of the time Expression Expression Mode: Verbal Expression: 3-Expresses basic 50 - 74% of the time/requires cueing 25 - 50% of the time. Needs to repeat parts of sentences. Social Interaction Social Interaction: 3-Interacts appropriately 50 - 74% of the time - May be physically or verbally inappropriate. Problem Solving Problem Solving: 1-Solves basic less than 25% of the time - needs direction nearly all the time or does not effectively solve problems and may need a restraint for safety Memory Memory: 1-Recognizes or recalls less than 25% of the time/requires cueing greater than 75% of the time FIM - Eating Eating Activity: 5: Supervision/cues;5: Set-up assist for open containers;5: Needs verbal cues/supervision  Pain Pain Assessment Pain Assessment: No/denies pain Pain Score: 0-No pain  Therapy/Group: Individual Therapy  Victor [name] 04/07/2014, 12:19 PM

## 2014-04-07 NOTE — Progress Notes (Addendum)
Occupational Therapy Session Note  Patient Details  Name: Victor [name] MRN: [id number] Date of Birth: 1991-03-13  Today's Date: 04/07/2014 OT Individual Time: 2683-4196 OT Individual Time Calculation (min): 45 min    Short Term Goals: Week 2:  OT Short Term Goal 1 (Week 2): Patient will sustain attention to functional task for 2 min with min cues OT Short Term Goal 2 (Week 2): Patient will complete sit<>stand during self-care task with mod assist OT Short Term Goal 3 (Week 2): Patient will complete toilet transfer with mod assist and mod cues for anterior weight shift OT Short Term Goal 4 (Week 2): Patient will locate 3/4 self-care items from field of 3.  Skilled Therapeutic Interventions/Progress Updates:   Pt seen for ADL retraining with focus on initiation, functional transfers, standing balance, attention, and task completion. Pt received supine in bed. Pt initially oriented to person, place and situation, however no carryover throughout remainder of session, requiring total assist for orientation. Pt completed supine>sit with HOB flat and min cues for scooting to place feet on floor. Completed squat pivot transfer bed>w/c with mod assist. Completed stand pivot transfer w/c<>toilet with max cues and max assist for controlled descent. Pt required mod assist in standing for pivoting. Pt assisted with clothing management at toilet, however required total assist to complete. Completed sponge bath at sink with pt requiring mod multimodal cues for sequencing and completion. Pt required max cues for sustained attention throughout self-care tasks as he was perseverating on eating "breakfast, lunch, and dinner." Pt left sitting in tilt-in-space with all needs in reach and sitter present.   Therapy Documentation Precautions:  Precautions Precautions: Fall Precaution Comments: abdominal binder over peg, R crani in abdomen Required Braces or Orthoses: Other Brace/Splint Cervical Brace: Other  (comment) Other Brace/Splint: Cervical Collar D/C'ed Restrictions Weight Bearing Restrictions: No Other Position/Activity Restrictions: WBAT LLE General:   Vital Signs:   Pain:  No report of pain during therapy session.  See FIM for current functional status  Therapy/Group: Individual Therapy  [name] 04/07/2014, 10:32 AM
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
## 2014-04-07 NOTE — Progress Notes (Signed)
Occupational Therapy Session Note  Patient Details  Name: Victor [name] MRN: [id number] Date of Birth: 10-29-1990  Today's Date: 04/07/2014 OT Individual Time: 1400-1445 OT Individual Time Calculation (min): 45 min    Short Term Goals: Week 2:  OT Short Term Goal 1 (Week 2): Patient will sustain attention to functional task for 2 min with min cues OT Short Term Goal 2 (Week 2): Patient will complete sit<>stand during self-care task with mod assist OT Short Term Goal 3 (Week 2): Patient will complete toilet transfer with mod assist and mod cues for anterior weight shift OT Short Term Goal 4 (Week 2): Patient will locate 3/4 self-care items from field of 3.  Skilled Therapeutic Interventions/Progress Updates:    Pt seen for 1:1 OT session with focus on problem solving, coordination, attention, and visual perceptual skills. Pt received supine in bed. Required max assist for squat pivot transfer bed>w/c. Engaged in [employer] with max cues initially then progressing to mod cues for following rules of game. Pt required max cues for problem solving to win game. Engaged in Connect 4 game with emphasis on carryover of learning of game earlier. Pt required min cues for verbalizing rules of game. Completed 4 games with pt blocking therapist 75% of time with max questioning cues. Pt "won" 1 game with increased time and no cues. Pt with improved coordination when placing tokens with R hand during game, however required hand over hand assist with use of L hand. Engaged in number search and writing activity with emphasis on visual perceptual skills and problem solving. Pt required mod-max cues to follow 1 step commands to connect numbers 1-5 in order. Pt then wrote first and last name correctly. Therapist writing name with missing 1 letter and pt able to write missing letter 75% of time with mod cues. Pt required total assist to problem solve when 2 letters were missing. Pt propelled self back to room using BLEs  to increase coordination. Pt left sitting in w/c with sitter present.  Therapy Documentation Precautions:  Precautions Precautions: Fall Precaution Comments: abdominal binder over peg, R crani in abdomen Required Braces or Orthoses: Other Brace/Splint Cervical Brace: Other (comment) Other Brace/Splint: Cervical Collar D/C'ed Restrictions Weight Bearing Restrictions: No Other Position/Activity Restrictions: WBAT LLE General:   Vital Signs:   Pain: Pain Assessment Pain Assessment: No/denies pain  See FIM for current functional status  Therapy/Group: Individual Therapy  [name] 04/07/2014, 3:11 PM

## 2014-04-07 NOTE — Progress Notes (Signed)
Physical Therapy Session Note  Patient Details  Name: Victor [name] MRN: [id number] Date of Birth: 09/06/1990  Today's Date: 04/07/2014 PT Individual Time: 1000-1100 and 1530-1601 PT Individual Time Calculation (min): 60 min and 31 min  Short Term Goals: Week 2:  PT Short Term Goal 1 (Week 2): Patient will perform bed mobility with supervision on flat bed. PT Short Term Goal 2 (Week 2): Patient will perform functional transfers with modA x1. PT Short Term Goal 3 (Week 2): Patient will perform gait training 33' x1 with maxA x1. PT Short Term Goal 4 (Week 2): Patient will negotiate 5 steps with B handrails and maxA x1.  Skilled Therapeutic Interventions/Progress Updates:    AM Session: Patient received sitting in wheelchair. Session focused on wheelchair mobility, gait training, stair negotiation, and cognitive remediation. Patient requires total cues for orientation several times throughout session. Patient oriented to self and year, but requires cues for all other orientation. Wheelchair mobility to/from room, >200' each way, with supervision, mod cues for obstacle negotiation. Functional ambulation 74' x1 and 79' x1 with R UE around therapist's shoulder and L HHA from second helper. Patient with improvements in steppage gait with decreased hip/knee flexion, but still demonstrates steppage gait. Additionally, patient continues to demonstrate trunk extension and posterior pelvic tilt, which requires manual facilitation to improve to midline orientation and upright posture.  Stair negotiation x3 steps, ascends forwards, descends backwards; +2 assist with max-total cues for sequencing and safety. Patient engaged in game of ConnectFour with max cues for adherence to game rules and strategy. Patient left sitting in wheelchair with all needs within reach, seatbelt donned, and sitter present.  PM Session: Patient received sitting in recliner. Session focused on functional transfers, wheelchair mobility, and  bed mobility. Wheelchair mobility 200'x1 with B LEs and supervision, mod cues for obstacle negotiation. Scooting/lateral transfer wheelchair<>mat with minA, max cues for proper set up and positioning, sit<>supine with supervision and increased time, bridging x20, rolling to B sides with supervision. Patient reports "I'm done." Assisted back to room and left sitting in wheelchair with seatbelt donned and all needs within reach; RN present.  Therapy Documentation Precautions:  Precautions Precautions: Fall Precaution Comments: abdominal binder over peg, R crani in abdomen Required Braces or Orthoses: Other Brace/Splint Cervical Brace: Other (comment) Other Brace/Splint: Cervical Collar D/C'ed Restrictions Weight Bearing Restrictions: No Other Position/Activity Restrictions: WBAT LLE General: PT Amount of Missed Time (min): 29 Minutes PT Missed Treatment Reason: Patient fatigue Pain: Pain Assessment Pain Assessment: No/denies pain Pain Score: 0-No pain Locomotion : Ambulation Ambulation/Gait Assistance: 1: +2 Total assist Wheelchair Mobility Distance: 200   See FIM for current functional status  Therapy/Group: Individual Therapy  [name] Meleana Commerford, PT, DPT 04/07/2014, 4:08 PM

## 2014-04-08 ENCOUNTER — Inpatient Hospital Stay (HOSPITAL_COMMUNITY): Payer: Self-pay | Admitting: Speech Pathology

## 2014-04-08 ENCOUNTER — Inpatient Hospital Stay (HOSPITAL_COMMUNITY): Payer: Medicaid Other

## 2014-04-08 ENCOUNTER — Inpatient Hospital Stay (HOSPITAL_COMMUNITY): Payer: Medicaid Other | Admitting: *Deleted

## 2014-04-08 DIAGNOSIS — W19XXXA Unspecified fall, initial encounter: Secondary | ICD-10-CM

## 2014-04-08 DIAGNOSIS — S069XAA Unspecified intracranial injury with loss of consciousness status unknown, initial encounter: Secondary | ICD-10-CM

## 2014-04-08 DIAGNOSIS — S069X9A Unspecified intracranial injury with loss of consciousness of unspecified duration, initial encounter: Secondary | ICD-10-CM

## 2014-04-08 NOTE — Progress Notes (Signed)
Occupational Therapy Session Note  Patient Details  Name: Demilade Ducasse MRN: [id number] Date of Birth: [birth year]  Today's Date: 04/08/2014 OT Individual Time: (Co-Tx with KP 9:30-10:30) 1000-1030 OT Individual Time Calculation (min): 30 min  (60 min total time)   Short Term Goals: Week 2:  OT Short Term Goal 1 (Week 2): Patient will sustain attention to functional task for 2 min with min cues OT Short Term Goal 2 (Week 2): Patient will complete sit<>stand during self-care task with mod assist OT Short Term Goal 3 (Week 2): Patient will complete toilet transfer with mod assist and mod cues for anterior weight shift OT Short Term Goal 4 (Week 2): Patient will locate 3/4 self-care items from field of 3.  Skilled Therapeutic Interventions/Progress Updates: Continued ADL-retraining with assist from KP for safety.   Pt completed toileting and shower level BADL, returning to w/c using squat-pivot transfer with steadying assist from tub bench.   Pt completed dressing at sink although with no clothing as his uncle removed clothing for laundering.   Pt was provided diaper, socks and gown and donned diaper with setup assist and mod cues (redirection to sustain attention).   Pt laced diaper as underwear and stood at sink with min-max steadying assist +2 to pull up diaper.   Pt able to cross his legs to don socks and shoes with mod assist for thoroughness due to impaired problem solving.   Following dressing task, pt performed 3 sets of wall push-ups (10,10, 20 reps) to improve BUE strength with only steadying assist +2 and manual contact at LUE to maintain contact with wall/door and to advance position of hands as needed.   Pt left in w/c at end of session with RN tech attending.       Therapy Documentation Precautions:  Precautions Precautions: Fall Precaution Comments: abdominal binder over peg, R crani in abdomen Required Braces or Orthoses: Other Brace/Splint Cervical Brace: Other (comment) Other  Brace/Splint: Cervical Collar D/C'ed Restrictions Weight Bearing Restrictions: No Other Position/Activity Restrictions: WBAT LLE Vital Signs: Therapy Vitals Pulse Rate: 72 BP: 121/59 mmHg Patient Position (if appropriate): Sitting Pain: Pain Assessment Pain Assessment: No/denies pain  See FIM for current functional status  Therapy/Group: Individual Therapy  Blandina Renaldo 04/08/2014, 2:51 PM

## 2014-04-08 NOTE — Progress Notes (Signed)
Physical Therapy Session Note  Patient Details  Name: Victor [name] MRN: [id number] Date of Birth: 01-19-1991  Today's Date: 04/08/2014 PT Individual Time: 0800-0900 and 1530-1620 PT Individual Time Calculation (min): 60 min and 50 min  Short Term Goals: Week 2:  PT Short Term Goal 1 (Week 2): Patient will perform bed mobility with supervision on flat bed. PT Short Term Goal 2 (Week 2): Patient will perform functional transfers with modA x1. PT Short Term Goal 3 (Week 2): Patient will perform gait training 26' x1 with maxA x1. PT Short Term Goal 4 (Week 2): Patient will negotiate 5 steps with B handrails and maxA x1.  Skilled Therapeutic Interventions/Progress Updates:    AM Session: Patient received supine in bed. Patient's uncle present for entire session. Session focused on functional transfers, wheelchair mobility, gait training, and stair negotiation. Patient requires questioning cues for orientation several times throughout session, but able to produce correct answers with these cues. Patient oriented to self and year, but requires cues for all other orientation. Wheelchair mobility to/from room with B LEs, >200' each way, with supervision, mod cues for obstacle negotiation. Functional ambulation 36' x1 with R UE around therapist's shoulder and L HHA from second helper and 25' x1 with [name] and +2 assist. Patient with improvements in steppage gait with decreased hip/knee flexion, but still demonstrates steppage gait. Additionally, patient continues to demonstrate trunk extension and posterior pelvic tilt, which requires manual facilitation to improve to midline orientation and upright posture.  However, improved anterior alignment with use of [name].  Stair negotiation x3 steps, ascends forwards, descends backwards; +2 assist with max-total cues for sequencing and safety. Patient left sitting in tilt-n-space with seatbelt donned and all needs within reach.  PM Session: Patient received  sitting in wheelchair. Session focused on wheelchair mobility and repeated sit<>stand transfers during hygiene/clean up after incontinent episode. Wheelchair mobility to/from room with B LEs, >200' each way, with supervision, mod cues for obstacle negotiation. After one sit>stand, noted patient incontinent of bowel and bladder. Returned to room, several sit<>stands and prolonged standing for hygiene/clean up, requires minA overall with B UE support on sink. Once returned to sitting, emphasis on orientation in which patient requires totalA. Patient left sitting in wheelchair with all needs within reach and seat belt donned.  Therapy Documentation Precautions:  Precautions Precautions: Fall Precaution Comments: abdominal binder over peg, R crani in abdomen Required Braces or Orthoses: Other Brace/Splint Cervical Brace: Other (comment) Other Brace/Splint: Cervical Collar D/C'ed Restrictions Weight Bearing Restrictions: No Other Position/Activity Restrictions: WBAT LLE General: PT Amount of Missed Time (min): 10 Minutes PT Missed Treatment Reason: Patient fatigue;Increased agitation Pain: Pain Assessment Pain Assessment: No/denies pain Pain Score: 0-No pain Locomotion : Ambulation Ambulation/Gait Assistance: 1: +2 Total assist Wheelchair Mobility Distance: 200   See FIM for current functional status  Therapy/Group: Individual Therapy  [name] [name], PT, DPT 04/08/2014, 4:34 PM

## 2014-04-08 NOTE — Progress Notes (Signed)
Occupational Therapy Session Note  Patient Details  Name: Victor [name] MRN: [id number] Date of Birth: [birth year]  Today's Date: 04/08/2014 OT Co-Treatment Time: 0930 (co-tx with OT (FB) 0930-1030)-1000 OT Co-Treatment Time Calculation (min): 30 min   Short Term Goals: Week 2:  OT Short Term Goal 1 (Week 2): Patient will sustain attention to functional task for 2 min with min cues OT Short Term Goal 2 (Week 2): Patient will complete sit<>stand during self-care task with mod assist OT Short Term Goal 3 (Week 2): Patient will complete toilet transfer with mod assist and mod cues for anterior weight shift OT Short Term Goal 4 (Week 2): Patient will locate 3/4 self-care items from field of 3.  Skilled Therapeutic Interventions/Progress Updates:    Pt seen for skilled co-tx with OT (FB) for ADL retraining with focus on functional transfers, functional mobility, attention, visual perceptual skills, standing balance, and sequencing. Pt received in w/c requesting to go to bathroom. Ambulated with +2 assist (3 musketeers technique) to bathroom. Pt incontinent of bowel at this time and required total assist for clothing management. Ambulated to walk-in shower with +2 assist. Completed bathing with pt demonstrating sustained attention to task ~3 min with max cues. Pt required max verbal and tactile cues for washing all body parts as he demonstrated inattention to L side. Completed sit<>stand in shower with min-mod assist and pt with decreased safety awareness. Pt required mod assist for squat pivot transfer shower>w/c with max cues d/t impulsivity. Pt completed dressing at sink. See FB's OT note for details.   Therapy Documentation Precautions:  Precautions Precautions: Fall Precaution Comments: abdominal binder over peg, R crani in abdomen Required Braces or Orthoses: Other Brace/Splint Cervical Brace: Other (comment) Other Brace/Splint: Cervical Collar D/C'ed Restrictions Weight Bearing Restrictions:  No Other Position/Activity Restrictions: WBAT LLE General:   Vital Signs:   Pain: Pain Assessment Pain Assessment: No/denies pain Pain Score: 0-No pain  See FIM for current functional status  Therapy/Group: Co-Treatment  Keandre Linden N 04/08/2014, 10:56 AM
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
## 2014-04-08 NOTE — Progress Notes (Signed)
Occupational Therapy Note  Patient Details  Name: Victor [name] MRN: [id number] Date of Birth: 1990-08-07  Today's Date: 04/08/2014 OT Individual Time: 2248-2500 OT Individual Time Calculation (min): 30 min   Pt resting in w/c with sitter present.  Pt agreeable to engaging in standing activities in therapy gym.  Pt asked numerous times throughout session when he was going home.  Pt not oriented to city or date.  Pt stood with min A while placing hands on wall to engaged in weight bearing through LUE and performing "wall pushups." Pt was able to correctly keep count of number of repetitions.  Focus on activity tolerance, cognitive remediation, standing balance, and safety awareness.   Lavone Neri [facility] 04/08/2014, 1:57 PM

## 2014-04-08 NOTE — Progress Notes (Signed)
Social Work Patient ID: Victor [name], male   DOB: 07-05-1991, 23 y.o.   MRN: [id number]  Able to reach pt's uncle over the weekend and discuss progress and targeted d/c date of 9/29. Uncle continues to state his plan of pt to come home with him and he plans to be moved into new home this week. Per team, uncle with limited understanding/ appreciation of pt's cognitive impairments. Have planned for uncle to be here Wed morning to watch therapies and begin more specific education. Will follow up with him on that day as well. Also trying to determine what pt may have been receiving in terms of mental health services/ medications PTA. Continue to follow.  Chistopher Mangino, LCSW

## 2014-04-08 NOTE — Progress Notes (Signed)
Speech Language Pathology Daily Session Note  Patient Details  Name: Victor [name] MRN: [id number] Date of Birth: 1991/01/12  Today's Date: 04/08/2014 SLP Individual Time: 1105-1205 SLP Individual Time Calculation (min): 60 min  Short Term Goals: Week 2: SLP Short Term Goal 1 (Week 2): Patient will utilize swallowing compensatory strategies with Min A multimodal cueing with current diet to minimize overt s/s of aspiration. SLP Short Term Goal 2 (Week 2): Patient with utilize external aids for recall of orientation information with Max multimodal cues. SLP Short Term Goal 3 (Week 2): Patient will verbalize 1 physical and 1 cognitive deficit with Max A question cues. SLP Short Term Goal 4 (Week 2): Patient will attend to basic self-care tasks for 15 minutes with Mod cues for redirections. SLP Short Term Goal 5 (Week 2): Patient will demonstrate functional problem solving for basic and familiar tasks with Max A.  Skilled Therapeutic Interventions:  Pt was seen for skilled speech therapy targeting cognitive goals.  Upon arrival, pt was seated upright in wheelchair with sitter present, awake, alert, and agreeable to participate to ST.  SLP facilitated the session with a functional snack of magic cup consistencies targeting sustained attention for self feeding tasks.  Pt exhibited difficulty manipulating spoon with hard, frozen consistencies and benefited from min assist faded to supervision cues to ask for help as needed over ~50-75% of opportunities.  Pt attended to self feeding task for ~7 minutes before requiring min cuing for redirection.  SLP reoriented pt to place, date, and situation with max assist and use of calendars and environmental cues as external aids.  SLP also facilitated the session with a basic card game targeting functional problem solving with pt requiring max assist to complete task due to impulsivity and decreased sustained attention.  Pt attended to basic money sorting and counting  tasks for 2-3 minutes with min cuing for redirection.  Continue per current plan of care.      FIM:  Comprehension Comprehension Mode: Auditory Comprehension: 2-Understands basic 25 - 49% of the time/requires cueing 51 - 75% of the time Expression Expression Mode: Verbal Expression: 3-Expresses basic 50 - 74% of the time/requires cueing 25 - 50% of the time. Needs to repeat parts of sentences. Social Interaction Social Interaction: 4-Interacts appropriately 75 - 89% of the time - Needs redirection for appropriate language or to initiate interaction. Problem Solving Problem Solving: 2-Solves basic 25 - 49% of the time - needs direction more than half the time to initiate, plan or complete simple activities Memory Memory: 1-Recognizes or recalls less than 25% of the time/requires cueing greater than 75% of the time FIM - Eating Eating Activity: 5: Supervision/cues;5: Set-up assist for open containers;5: Needs verbal cues/supervision  Pain Pain Assessment Pain Assessment: No/denies pain  Therapy/Group: Individual Therapy  Donzella Carrol, [name] 04/08/2014, 1:13 PM

## 2014-04-09 ENCOUNTER — Inpatient Hospital Stay (HOSPITAL_COMMUNITY): Payer: Medicaid Other

## 2014-04-09 ENCOUNTER — Encounter (HOSPITAL_COMMUNITY): Payer: Self-pay

## 2014-04-09 ENCOUNTER — Inpatient Hospital Stay (HOSPITAL_COMMUNITY): Payer: Self-pay

## 2014-04-09 ENCOUNTER — Inpatient Hospital Stay (HOSPITAL_COMMUNITY): Payer: Medicaid Other | Admitting: Speech Pathology

## 2014-04-09 ENCOUNTER — Inpatient Hospital Stay (HOSPITAL_COMMUNITY): Payer: Medicaid Other | Admitting: Occupational Therapy

## 2014-04-09 DIAGNOSIS — S069XAA Unspecified intracranial injury with loss of consciousness status unknown, initial encounter: Secondary | ICD-10-CM

## 2014-04-09 DIAGNOSIS — W19XXXA Unspecified fall, initial encounter: Secondary | ICD-10-CM

## 2014-04-09 DIAGNOSIS — S069X9A Unspecified intracranial injury with loss of consciousness of unspecified duration, initial encounter: Secondary | ICD-10-CM

## 2014-04-09 MED ORDER — FREE WATER
50.0000 mL | Freq: Two times a day (BID) | Status: DC
  Administered 2014-04-09 – 2014-04-25 (×32): 50 mL
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
## 2014-04-09 NOTE — Progress Notes (Signed)
Occupational Therapy Session Note  Patient Details  Name: [name] MRN: [id number] Date of Birth: Apr 20, 1991  Today's Date: 04/09/2014 OT Individual Time:Session 1 0800-0900  Session 2 1330-1400 OT Individual Time Calculation (min): 60 min Session 2 30 mins   Short Term Goals: Week 2:  OT Short Term Goal 1 (Week 2): Patient will sustain attention to functional task for 2 min with min cues OT Short Term Goal 2 (Week 2): Patient will complete sit<>stand during self-care task with mod assist OT Short Term Goal 3 (Week 2): Patient will complete toilet transfer with mod assist and mod cues for anterior weight shift OT Short Term Goal 4 (Week 2): Patient will locate 3/4 self-care items from field of 3.  Skilled Therapeutic Interventions/Progress Updates:   Session 1 Pt engaged in BADL retraining including bathing and dressing with sit<>stand from w/c at sink.  Pt required min A for supine->sit EOB in preparation for transfer to w/c.  Pt continues to exhibit posterior lean with sit<>stand and when standing which requires mod A for balance.  Pt initiated all bathing tasks with min verbal cues for thoroughness when presented with bathing supplies.  Pt appropriately requested additional soap during bathing tasks.  Pt donned underpants, socks, and shoes when presented.  Pt required mod A for sit<>stand at sink to pull up brief.  Pt did not have clothing and donned hospital gown.  Pt transitioned to therapy gym for standing tasks and LUE tasks seated.  Focus on activity tolerance, sit<>stand, standing balance, task initiation, sequencing, attention to task, transfers, and safety awareness. Pt not oriented to city, date, or situation.   Session 2 Focus on cognitive remediation including task initiation, sequencing, attention to task, orientation, and safety awareness.  Pt initially engaged in playing Connect 4.  Pt initiated but required max verbal cues to determine winner X 3.  Pt transitioned to pipe tree  activity.  Pt assembled simplest structure when pieces preselected but was unable to assemble the next structure when selecting pieces intermingled with other pieces.  Pt exhibited decreased attention to task near the end of the session.  Therapy Documentation Precautions:  Precautions Precautions: Fall Precaution Comments: abdominal binder over peg, R crani in abdomen Required Braces or Orthoses: Other Brace/Splint Cervical Brace: Other (comment) Other Brace/Splint: Cervical Collar D/C'ed Restrictions Weight Bearing Restrictions: No Other Position/Activity Restrictions: WBAT LLE General:   Vital Signs: Therapy Vitals Temp: 97.8 F (36.6 C) Temp src: Oral Pulse Rate: 85 Resp: 18 BP: 136/76 mmHg Patient Position (if appropriate): Lying Oxygen Therapy SpO2: 99 % O2 Device: None (Room air) Pain: Pain Assessment Pain Assessment: No/denies pain  See FIM for current functional status  Therapy/Group: Individual Therapy  [name] 04/09/2014, 9:02 AM

## 2014-04-09 NOTE — Progress Notes (Signed)
Speech Language Pathology Daily Session Note  Patient Details  Name: Victor [name] MRN: [id number] Date of Birth: June 08, 1991  Today's Date: 04/09/2014 SLP Individual Time: 1015-1100 SLP Individual Time Calculation (min): 45 min  Short Term Goals: Week 2: SLP Short Term Goal 1 (Week 2): Patient will utilize swallowing compensatory strategies with Min A multimodal cueing with current diet to minimize overt s/s of aspiration. SLP Short Term Goal 2 (Week 2): Patient with utilize external aids for recall of orientation information with Max multimodal cues. SLP Short Term Goal 3 (Week 2): Patient will verbalize 1 physical and 1 cognitive deficit with Max A question cues. SLP Short Term Goal 4 (Week 2): Patient will attend to basic self-care tasks for 15 minutes with Mod cues for redirections. SLP Short Term Goal 5 (Week 2): Patient will demonstrate functional problem solving for basic and familiar tasks with Max A.  Skilled Therapeutic Interventions: Skilled treatment session focused on patient/family education with the patient's uncle. SLP facilitated session by providing verbal and visual demonstration of the patient's current swallowing and cognitive-linguistic impairments. Patient demonstrated verbal perseveration throughout the session and required Max A multimodal cues for redirection for topic maintenance. The patient also required Max A multimodal cues for orientation to place, time and situation and to utilize external aids for recall. Patient continues to require total A for oral reading and identifying numbers and letters, however, patient's uncle reports the patient was able to read at baseline and was attending [facility]. The patient demonstrated increased verbal agitation with this clinician today and reported, "I was talking too much." The patient also required Max A multimodal cues for functional problem solving with self-care tasks while on commode. The patient's uncle verbalized understanding of  all information and did not have any questions at this time, however, he will require continued education in regards to TBI and patient's current impairments prior to discharge. Continue with current plan of care.    FIM:  Comprehension Comprehension Mode: Auditory Comprehension: 2-Understands basic 25 - 49% of the time/requires cueing 51 - 75% of the time Expression Expression Mode: Verbal Expression: 2-Expresses basic 25 - 49% of the time/requires cueing 50 - 75% of the time. Uses single words/gestures. Social Interaction Social Interaction: 2-Interacts appropriately 25 - 49% of time - Needs frequent redirection. Problem Solving Problem Solving: 1-Solves basic less than 25% of the time - needs direction nearly all the time or does not effectively solve problems and may need a restraint for safety Memory Memory: 1-Recognizes or recalls less than 25% of the time/requires cueing greater than 75% of the time  Pain Pain Assessment Pain Assessment: No/denies pain  Therapy/Group: Individual Therapy  Victor [name] 04/09/2014, 11:29 AM

## 2014-04-09 NOTE — Progress Notes (Signed)
Physical Therapy Session Note  Patient Details  Name: Victor [name] MRN: [id number] Date of Birth: October 23, 1990  Today's Date: 04/09/2014 PT Individual Time: 1430-1515 PT Individual Time Calculation (min): 45 min   Short Term Goals: Week 2:  PT Short Term Goal 1 (Week 2): Patient will perform bed mobility with supervision on flat bed. PT Short Term Goal 2 (Week 2): Patient will perform functional transfers with modA x1. PT Short Term Goal 3 (Week 2): Patient will perform gait training 62' x1 with maxA x1. PT Short Term Goal 4 (Week 2): Patient will negotiate 5 steps with B handrails and maxA x1.    Skilled Therapeutic Interventions/Progress Updates:  tx focused on attention to task, neuromuscular re-education via forced use, manual, VCs.   Gait with R railing in hallway, x 30' with mod/max assist; x 10' backwards with same rail, total assist.  Gait with weighted grocery cart x 20' for bil UE use, midline orientation, terminated due to pt's tendency to lean R heavily.  Gait with Carley Hammed +2 assist for manual cues for wt shifting/step length, x 40'.  Pt performed at least 50% of effort using EVA. 2nd person needed to steer Carley Hammed as pt tends to push it R, causing L foot to bump walker.  Pt perseverated on food, eating, etc.  During gait tasks, he remembered distances that therapist suggested and objected if he was asked to go farther, although he did not become agitated.  Therapy Documentation Precautions:  Precautions Precautions: Fall Precaution Comments: abdominal binder over peg, R crani in abdomen Required Braces or Orthoses: Other Brace/Splint Cervical Brace: Other (comment) Other Brace/Splint: Cervical Collar D/C'ed Restrictions Weight Bearing Restrictions: No Other Position/Activity Restrictions: WBAT LLE Pain: Pain Assessment Pain Assessment: No/denies pain   Locomotion : Ambulation Ambulation/Gait Assistance: 1: +2 Total assist     See FIM for current functional  status  Therapy/Group: Individual Therapy  Victor [name] 04/09/2014, 3:16 PM

## 2014-04-09 NOTE — Progress Notes (Signed)
Physical Therapy Session Note  Patient Details  Name: Victor [name] MRN: [id number] Date of Birth: June 18, 1991  Today's Date: 04/09/2014 PT Individual Time: 1130-1200 PT Individual Time Calculation (min): 30 min   Short Term Goals: Week 2:  PT Short Term Goal 1 (Week 2): Patient will perform bed mobility with supervision on flat bed. PT Short Term Goal 2 (Week 2): Patient will perform functional transfers with modA x1. PT Short Term Goal 3 (Week 2): Patient will perform gait training 54' x1 with maxA x1. PT Short Term Goal 4 (Week 2): Patient will negotiate 5 steps with B handrails and maxA x1.  Skilled Therapeutic Interventions/Progress Updates:    Pt received seated in w/c w/ sitter present, agreeable to participate in therapy. Session focused on seated balance, functional mobility, alternating attention. Pt transported to rehab gym via w/c. Pt participated in football tossing activity while seated in TIS w/c 2x3'. When given cue to count each time pt threw ball, pt able to attend to dual task w/ min cueing progressing to mod cueing w/ fluctuating counting accuracy later in activity when rehab gym became louder and more distractible. Pt w/ x1 sit<>stand for therapist to adjust abdominal binder, however binder not large enough to fit around pt and protect PEG tube site effectively. Attempted to adjust binder in seated, but pt w/ complaints of discomfort, "you're moving me around too much." Pt transported back to room, left seated in w/c w/ sitter and uncle present w/ all needs within reach.  Therapy Documentation Precautions:  Precautions Precautions: Fall Precaution Comments: abdominal binder over peg, R crani in abdomen Required Braces or Orthoses: Other Brace/Splint Cervical Brace: Other (comment) Other Brace/Splint: Cervical Collar D/C'ed Restrictions Weight Bearing Restrictions: No Other Position/Activity Restrictions: WBAT LLE General:   Vital Signs: Therapy Vitals Temp: 97.8 F  (36.6 C) Temp src: Oral Pulse Rate: 53 Resp: 18 BP: 136/76 mmHg Patient Position (if appropriate): Lying Oxygen Therapy SpO2: 99 % O2 Device: None (Room air) Pain:   Mobility:   Locomotion :    Trunk/Postural Assessment :    Balance:   Exercises:   Other Treatments:    See FIM for current functional status  Therapy/Group: Individual Therapy  Hosie Spangle Hosie Spangle, PT, DPT 04/09/2014, 7:56 AM

## 2014-04-09 NOTE — Progress Notes (Signed)
Occupational Therapy Weekly Progress Note  Patient Details  Name: Victor [name] MRN: [id number] Date of Birth: [birth year]  Beginning of progress report period: April 01, 2014 End of progress report period: April 09, 2014  Today's Date: 04/09/2014 OT Individual Time: 1100-1130 OT Individual Time Calculation (min): 30 min    Patient has met 3 of 4 short term goals. Patient has made good progress in therapy during this reporting period. Patient completes sit<>stand during self-care tasks with min-max assist and functional transfers with max assist. Patient is demonstrating improved sequencing and attention during self-care tasks, requiring mod-max cues. Family education with uncle began at end of reporting period and plan to follow-up with more training/education sessions to facilitate safe discharge home. 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 Patient often verbally perseverates, requiring max cues for redirection to functional task. Patient is currently demonstrating behaviors consistent with Rancho Level V.   Patient continues to demonstrate the following deficits: decreased strength, decreased postural control in sitting and standing, decreased balance, decreased attention, decreased safety awareness, decreased coordination, decreased activity tolerance, decreased awareness, spatial deficits, poor motor planning, visual deficits, and impaired overall cognition and therefore will continue to benefit from skilled OT intervention to enhance overall performance with BADLs, awareness, balance, coordination, and safety awareness.  Patient progressing toward long term goals..  Continue plan of care.  OT Short Term Goals Week 2:  OT Short Term Goal 1 (Week 2): Patient will sustain attention to functional task for 2 min with min cues OT Short Term Goal 1 - Progress (Week 2): Met OT Short Term Goal 2 (Week 2): Patient will  complete sit<>stand during self-care task with mod assist OT Short Term Goal 2 - Progress (Week 2): Met OT Short Term Goal 3 (Week 2): Patient will complete toilet transfer with mod assist and mod cues for anterior weight shift OT Short Term Goal 3 - Progress (Week 2): Progressing toward goal OT Short Term Goal 4 (Week 2): Patient will locate 3/4 self-care items from field of 3. OT Short Term Goal 4 - Progress (Week 2): Met Week 3:  OT Short Term Goal 1 (Week 3): Patient will complete toilet transfer with mod assist OT Short Term Goal 2 (Week 3): Patient will consistently be oriented x3 with min cues OT Short Term Goal 3 (Week 3): Patient will demonstrate sustained attention to task for 5 min with min cues OT Short Term Goal 4 (Week 3): Patient will complete 2 grooming tasks in standing with min assist for balance  Skilled Therapeutic Interventions/Progress Updates:    Pt seen for 1:1 OT session with focus on family education, standing balance, sit<>stand, and L NMR. Pt received sitting in w/c oriented to person and place. Pt's uncle present. Provided education on BI, CLOF, biggest barriers at this time, cognitive deficits, DME, etc. Pt's uncle acknowledging all education and reporting no questions at this time. Pt's uncle present throughout session giving encouragement to pt at times.  Completed dressing at sink with setup assist and min cues for donning shirt. Completed sit<>stand for clothing management with mod assist and max cues for postural control d/t hip flexion. Engaged in reaching tasks at sink to facilitate weight shift and hip extension with pt requiring min-mod assist with LUE supported on sink. Emphasis on controlled decent stand>sit with pt requiring max cues. Completed wall push-ups to increase BUE strength with tape as visual cue for hand placement. Added cognitive component to task by having  pt count 15 reps. Pt with poor frustration tolerance towards end of session, liking from  fatigue and overstimulation with education and multiple therapy sessions this AM. Pt left sitting in w/c with PT present and uncle with no questions at this time. Uncle would benefit from continued education and training.  Therapy Documentation Precautions:  Precautions Precautions: Fall Precaution Comments: abdominal binder over peg, R crani in abdomen Required Braces or Orthoses: Other Brace/Splint Cervical Brace: Other (comment) Other Brace/Splint: Cervical Collar D/C'ed Restrictions Weight Bearing Restrictions: No Other Position/Activity Restrictions: WBAT LLE General:   Vital Signs: Therapy Vitals Pulse Rate: 85 Pain: Pain Assessment Pain Assessment: No/denies pain     See FIM for current functional status  Therapy/Group: Individual Therapy  Duayne Cal 04/09/2014, 10:42 AM

## 2014-04-09 NOTE — Progress Notes (Signed)
Occupational Therapy Session Note  Patient Details  Name: Victor [name] MRN: [id number] Date of Birth: 25-Jun-1991  Today's Date: 04/09/2014 OT Individual Time: 1520-1550 OT Individual Time Calculation (min): 30 min    Short Term Goals: Week 3:  OT Short Term Goal 1 (Week 3): Patient will complete toilet transfer with mod assist OT Short Term Goal 2 (Week 3): Patient will consistently be oriented x3 with min cues OT Short Term Goal 3 (Week 3): Patient will demonstrate sustained attention to task for 5 min with min cues OT Short Term Goal 4 (Week 3): Patient will complete 2 grooming tasks in standing with min assist for balance  Skilled Therapeutic Interventions/Progress Updates:  Upon entering the room, pt seated in wheelchair with sitter present in room. Pt with no c/o pain this session. Pt oriented to self and stated with min verbal cues that he was in a hospital. Pt not oriented to time or city of hospital location. Pt engaged in [facility] activity with L hand of palmar translation of playing cards x 10 reps. Pt with increase frustration with task. Pt engaged in memory card game in order to find matches. Pt perseverated on 2-3 cards during session and would continue to turn them over. Once therapist turned over a card that matched his he was able to then locate that card to form a match. Pt engaged in flipping over all cards played with utilizing L hand in order to increase coordination skills. Pt returned to room where sitter present at end of session.   Therapy Documentation Precautions:  Precautions Precautions: Fall Precaution Comments: abdominal binder over peg, R crani in abdomen Required Braces or Orthoses: Other Brace/Splint Cervical Brace: Other (comment) Other Brace/Splint: Cervical Collar D/C'ed Restrictions Weight Bearing Restrictions: No Other Position/Activity Restrictions: WBAT LLE  See FIM for current functional status  Therapy/Group: Individual Therapy  Lowella Grip 04/09/2014, 4:40 PM

## 2014-04-09 NOTE — Progress Notes (Signed)
Physical Therapy Session Note  Patient Details  Name: Victor [name] MRN: [id number] Date of Birth: 02/03/1991  Today's Date: 04/09/2014 PT Individual Time: 1130-1200 PT Individual Time Calculation (min): 30 min   Short Term Goals: Week 1:  PT Short Term Goal 1 (Week 1): Patient will perform bed mobility with minA. PT Short Term Goal 1 - Progress (Week 1): Met PT Short Term Goal 2 (Week 1): Patient will perform functional transfers with maxA x1. PT Short Term Goal 2 - Progress (Week 1): Met PT Short Term Goal 3 (Week 1): Patient will ambulate 25' with +2 assist. PT Short Term Goal 3 - Progress (Week 1): Met PT Short Term Goal 4 (Week 1): Patient will negotiate 2 steps with B handrails and +2 assist. PT Short Term Goal 4 - Progress (Week 1): Met  Skilled Therapeutic Interventions/Progress Updates:  1:1. Pt received sitting in w/c, ready for therapy with Uncle present. Focus this session on family education/training, functional transfers, functional w/c propulsion and gait training. Pt completing lateral scoot t/f w/c<>bed with min A and mod cues to prevent extension, pt's Uncle then performed returned demonstration of lateral scoot transfer with pt w/c<>tx mat with min guard A due to decreased trunk extension. Pt req consistent cues for safety due to impulsivity. Pt's Uncle req cues for increased proximity to pt and appropriate cues for pt safety. Pt amb 100'x1 with Ax2 persons (R UE around therapist's shoulder and L HHA by Uncle) to demonstrate amount of assistance pt currently requires for standing mobility at this time. Manual facilitation to correct for trunk extension and steppage gait.  Pt propelled w/c 200'x2 on unit with supervision, req mod cues by therapist for attention to task and awareness of obstacles. Pt challenged to find his room based on room number to magnify visual, attention and awareness impairments for Uncle. Pt req max multimodal cues for completion of task. At end of session,  pt's Uncle appeared to be unphased by pt's physical and cognitive impairments, he would greatly benefit from continued education/training. Pt left sitting in w/c at end of session w/ all needs in reach, quick release belt in place and sitter in room.   Therapy Documentation Precautions:  Precautions Precautions: Fall Precaution Comments: abdominal binder over peg, R crani in abdomen Required Braces or Orthoses: Other Brace/Splint Cervical Brace: Other (comment) Other Brace/Splint: Cervical Collar D/C'ed Restrictions Weight Bearing Restrictions: No Other Position/Activity Restrictions: WBAT LLE   Pain: Pain Assessment Pain Assessment: No/denies pain  See FIM for current functional status  Therapy/Group: Individual Therapy  ,  S 04/09/2014, 5:10 PM  

## 2014-04-10 ENCOUNTER — Inpatient Hospital Stay (HOSPITAL_COMMUNITY): Payer: Medicaid Other | Admitting: *Deleted

## 2014-04-10 ENCOUNTER — Inpatient Hospital Stay (HOSPITAL_COMMUNITY): Payer: Medicaid Other | Admitting: Physical Therapy

## 2014-04-10 ENCOUNTER — Inpatient Hospital Stay (HOSPITAL_COMMUNITY): Payer: Medicaid Other | Admitting: Speech Pathology

## 2014-04-10 LAB — BASIC METABOLIC PANEL
Anion gap: 16 — ABNORMAL HIGH (ref 5–15)
BUN: 9 mg/dL (ref 6–23)
CO2: 25 mEq/L (ref 19–32)
Calcium: 9.1 mg/dL (ref 8.4–10.5)
Chloride: 103 mEq/L (ref 96–112)
Creatinine, Ser: 0.7 mg/dL (ref 0.50–1.35)
GFR calc Af Amer: >90 mL/min (ref >90)
GFR calc non Af Amer: >90 mL/min (ref >90)
Glucose, Bld: 100 mg/dL — ABNORMAL HIGH (ref 70–99)
Potassium: 4.6 mEq/L (ref 3.7–5.3)
Sodium: 144 mEq/L (ref 137–147)

## 2014-04-10 NOTE — Progress Notes (Signed)
Occupational Therapy Note  Patient Details  Name: Victor [name] MRN: [id number] Date of Birth: 1991-01-20  Today's Date: 04/10/2014 OT Individual Time: 1300-1350 OT Individual Time Calculation (min): 50 min  Pt denied pain. Individual Therapy  Pt resting in bed upon arrival but agreeable to participating in therapy.  Pt required max A for stand pivot transfer to w/c secondary to considerable posterior lean when standing.  Pt impulsive during transfer.  Pt transitioned to therapy gym and engaged in table activities (connect 4) to address task initiation, sequencing, attention to task, increased use of LUE for functional tasks.  Pt required max verbal cues to determine who winner was of each game.  Pt correctly described object of game and was able to take turns appropriately.  When challenged to use left hand to manipulate tokens patient exhibited increased agitation and stated that I was asking him to "do too much." Attempted 3 times to incorporate LUE into activities and patient responded with same response.  Pt not initially oriented to place or situation at beginning of session but oriented to place at end of session.    Lavone Neri [facility] 04/10/2014, 2:12 PM

## 2014-04-10 NOTE — Progress Notes (Signed)
Speech Language Pathology Weekly Progress and Session Note  Patient Details  Name: Cesare Sumlin MRN: [id number] Date of Birth: Nov 20, 1990  Beginning of progress report period: April 03, 2014 End of progress report period: April 10, 2014  Today's Date: 04/10/2014 SLP Individual Time: 1100-1200 SLP Individual Time Calculation (min): 60 min  Short Term Goals: Week 2: SLP Short Term Goal 1 (Week 2): Patient will utilize swallowing compensatory strategies with Min A multimodal cueing with current diet to minimize overt s/s of aspiration. SLP Short Term Goal 1 - Progress (Week 2): Not met SLP Short Term Goal 2 (Week 2): Patient with utilize external aids for recall of orientation information with Max multimodal cues. SLP Short Term Goal 2 - Progress (Week 2): Not met SLP Short Term Goal 3 (Week 2): Patient will verbalize 1 physical and 1 cognitive deficit with Max A question cues. SLP Short Term Goal 3 - Progress (Week 2): Not met SLP Short Term Goal 4 (Week 2): Patient will attend to basic self-care tasks for 15 minutes with Mod cues for redirections. SLP Short Term Goal 4 - Progress (Week 2): Met SLP Short Term Goal 5 (Week 2): Patient will demonstrate functional problem solving for basic and familiar tasks with Max A. SLP Short Term Goal 5 - Progress (Week 2): Not met    New Short Term Goals: Week 3: SLP Short Term Goal 1 (Week 3): Patient will utilize swallowing compensatory strategies with Mod A multimodal cueing with current diet to minimize overt s/s of aspiration. SLP Short Term Goal 2 (Week 3): Patient with orient to place and situation with Max multimodal cues. SLP Short Term Goal 3 (Week 3): Patient will verbalize 1 physical and 1 cognitive deficit with Max A question cues. SLP Short Term Goal 4 (Week 3): Patient will attend to basic self-care tasks for 15 minutes with Min cues for redirections. SLP Short Term Goal 5 (Week 3): Patient will initate set-up with meals/snacks with  Mod  Amultimodal cues.   Weekly Progress Updates: Patient has made minimal gains and has met 1 of 5 STG's this reporting period due to increased sustained attention and verbal expression with functional tasks. Currently, patient demonstrates behaviors consistent with a Rancho Level V and requires overall Total-Max A for recall of orientation, intellectual awareness, problem solving, working Herbalist. Patient continues to demonstrate increased attention to task but also demonstrates verbal perseveration and can be difficult to redirect. Patient is currently consuming Dys. 1 textures with nectar-thick liquids without overt s/s of aspiration and overall Max A multimodal cues to use swallowing compensatory strategies of small bites and sips. Patient with minimal gains this reporting period and suspect his function is impacted by his participation/cooperation level and increased verbal agitation with functional tasks. Patient/family education ongoing and patient would benefit from continued skilled SLP intervention to maximize his cognitive-linguistic and swallowing function and overall functional independence prior to discharge.   Intensity: Minumum of 1-2 x/day, 30 to 90 minutes Frequency: 5 out of 7 days Duration/Length of Stay: 04/22/14 Treatment/Interventions: Cognitive remediation/compensation;Cueing hierarchy;Dysphagia/aspiration precaution training;Environmental controls;Functional tasks;Internal/external aids;Patient/family education;Therapeutic Activities   Daily Session Skilled Therapeutic Interventions: Skilled treatment session focused on dysphagia and cognitive-linguistic goals. SLP facilitated session by providing oral care and trials of ice chips. Patient without overt s/s of aspiration but demonstrated decreased oral manipulation with a suspected delayed swallow initiation. The patient also consumed his lunch of Dys. 1 textures with nectar-thick liquids and required  Max-Total A to utilize a slow rate  of self-feeding and small bites/sips. Patient without overt s/s of aspiration but demonstrated use of multiple swallows with large sips of thin liquids via cup. Patient demonstrated intermittent verbal agitation with this clinician especially when increased cueing was needed for safety with meals. Patient demonstrated sustained attention to self-feeding for ~20 minutes with Mod A multimodal cues and required total A for orientation to time and situation, however, he was independently oriented to being at the "hospital." Patient left in wheelchair with quick release in place and sitter present. Continue with current plan of care.     FIM:  Comprehension Comprehension Mode: Auditory Comprehension: 2-Understands basic 25 - 49% of the time/requires cueing 51 - 75% of the time Expression Expression Mode: Verbal Expression: 2-Expresses basic 25 - 49% of the time/requires cueing 50 - 75% of the time. Uses single words/gestures. Social Interaction Social Interaction: 2-Interacts appropriately 25 - 49% of time - Needs frequent redirection. Problem Solving Problem Solving: 1-Solves basic less than 25% of the time - needs direction nearly all the time or does not effectively solve problems and may need a restraint for safety Memory Memory: 1-Recognizes or recalls less than 25% of the time/requires cueing greater than 75% of the time FIM - Eating Eating Activity: 5: Supervision/cues;5: Set-up assist for open containers;5: Needs verbal cues/supervision Pain No/Denies Pain   Therapy/Group: Individual Therapy  Lydon Vansickle, [place] 04/10/2014, 12:19 PM
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
## 2014-04-10 NOTE — Progress Notes (Signed)
Physical Therapy Session Note  Patient Details  Name: Victor [name] MRN: [id number] Date of Birth: 1990-09-27  Today's Date: 04/10/2014 PT Individual Time: 8657-8469 PT Individual Time Calculation (min): 44 min   Short Term Goals: Week 2:  PT Short Term Goal 1 (Week 2): Patient will perform bed mobility with supervision on flat bed. PT Short Term Goal 2 (Week 2): Patient will perform functional transfers with modA x1. PT Short Term Goal 3 (Week 2): Patient will perform gait training 69' x1 with maxA x1. PT Short Term Goal 4 (Week 2): Patient will negotiate 5 steps with B handrails and maxA x1.  Skilled Therapeutic Interventions/Progress Updates:    Patient received sitting in wheelchair. Session focused on functional transfers and wheelchair mobility. Patient with increasing bouts of verbal agitation (raising voice, refusing most activities) throughout session. More difficult to redirect today. When therapist speaks to patient, patient repeatedly responds "You're asking too many questions!" Patient refusing all activities including walking, stairs, etc. Patient perseverative with most phrases throughout session, repeatedly agitated with therapist. Frequent and prolonged rest breaks without activity or instruction to decrease physical and cognitive demand on patient and facilitate de-escalation, however, as soon as activity resumed, patient with increased agitation.   Only activity patient wanted to participate in was "lifting weights." Patient seated EOM elevated for sitting balance challenge, given 2 lb dowel and 5 lb dowel to perform bicep curls, but refuses to perform any other exercises. When asked what types of activities he performed at the gym, patient stated "push ups and sit ups", however, refused to perform these. Due to increased agitation, patient returned to room and left sitting in wheelchair with seatbelt donned, all needs within reach, and sitter present.  Therapy  Documentation Precautions:  Precautions Precautions: Fall Precaution Comments: abdominal binder over peg, R crani in abdomen Required Braces or Orthoses: Other Brace/Splint Cervical Brace: Other (comment) Other Brace/Splint: Cervical Collar D/C'ed Restrictions Weight Bearing Restrictions: No Other Position/Activity Restrictions: WBAT LLE General: PT Amount of Missed Time (min): 16 Minutes PT Missed Treatment Reason: Increased agitation Pain: Pain Assessment Pain Assessment: No/denies pain Pain Score: 0-No pain Locomotion : Ambulation Ambulation/Gait Assistance: Not tested (comment) Wheelchair Mobility Distance: 200   See FIM for current functional status  Therapy/Group: Individual Therapy  [name] Kaijah Abts, PT, DPT 04/10/2014, 8:57 AM
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
## 2014-04-10 NOTE — Progress Notes (Signed)
Occupational Therapy Session Note  Patient Details  Name: Victor [name] MRN: [id number] Date of Birth: 1991/04/21  Today's Date: 04/10/2014 OT Individual Time: 1000-1040 OT Individual Time Calculation (min): 40 min    Short Term Goals: Week 2:  OT Short Term Goal 1 (Week 2): Patient will sustain attention to functional task for 2 min with min cues OT Short Term Goal 1 - Progress (Week 2): Met OT Short Term Goal 2 (Week 2): Patient will complete sit<>stand during self-care task with mod assist OT Short Term Goal 2 - Progress (Week 2): Met OT Short Term Goal 3 (Week 2): Patient will complete toilet transfer with mod assist and mod cues for anterior weight shift OT Short Term Goal 3 - Progress (Week 2): Progressing toward goal OT Short Term Goal 4 (Week 2): Patient will locate 3/4 self-care items from field of 3. OT Short Term Goal 4 - Progress (Week 2): Met  Skilled Therapeutic Interventions/Progress Updates:    Pt engaged in BADL retraining including bathing and dressing with sit<>stand from w/c at sink.  Pt initiated all bathing tasks when presented with bathing supplies.  Pt required min verbal cues for thoroughness throughout session.  Pt was argumentative throughout session but was initially easily redirected.  Pt required mod A for sit<>stand at sink to bathe buttocks and pull up pants.  Pt became increasingly agitated as session progressed.  Session terminated early secondary to increased agitation.  Focus on activity tolerance, sit<>stand, standing balance, task initiation, sequencing, attention to task, cognitive remediation, and safety awareness.  Therapy Documentation Precautions:  Precautions Precautions: Fall Precaution Comments: abdominal binder over peg, R crani in abdomen Required Braces or Orthoses: Other Brace/Splint Cervical Brace: Other (comment) Other Brace/Splint: Cervical Collar D/C'ed Restrictions Weight Bearing Restrictions: No Other Position/Activity Restrictions:  WBAT LLE General: General OT Amount of Missed Time: 20 Minutes OT Missed Treatment Reason: Increased agitation Pain: Pain Assessment Pain Assessment: No/denies pain Pain Score: 0-No pain  See FIM for current functional status  Therapy/Group: Individual Therapy  Leroy Libman 04/10/2014, 11:00 AM

## 2014-04-10 NOTE — Plan of Care (Signed)
Problem: RH BLADDER ELIMINATION Goal: RH STG MANAGE BLADDER WITH ASSISTANCE STG Manage Bladder With total Assistance  Outcome: Not Progressing Incont. Wears condom cath.

## 2014-04-10 NOTE — Progress Notes (Signed)
Nursing Note: pt denies thoughts of harming himself.wbb

## 2014-04-10 NOTE — Progress Notes (Signed)
Orthopedic Tech Progress Note Patient Details:  Victor [name] 07-Dec-1990 [id number]  Ortho Devices Type of Ortho Device: Abdominal binder Ortho Device/Splint Interventions: Ordered   Asia Burnett Kanaris 04/10/2014, 11:10 AM

## 2014-04-11 ENCOUNTER — Inpatient Hospital Stay (HOSPITAL_COMMUNITY): Payer: Medicaid Other

## 2014-04-11 ENCOUNTER — Inpatient Hospital Stay (HOSPITAL_COMMUNITY): Payer: Medicaid Other | Admitting: *Deleted

## 2014-04-11 ENCOUNTER — Inpatient Hospital Stay (HOSPITAL_COMMUNITY): Payer: Medicaid Other | Admitting: Speech Pathology

## 2014-04-11 LAB — HEPATIC FUNCTION PANEL
ALBUMIN: 3.6 g/dL (ref 3.5–5.2)
ALK PHOS: 120 U/L — AB (ref 39–117)
ALT: 17 U/L (ref 0–53)
AST: 18 U/L (ref 0–37)
Bilirubin, Direct: <0.2 mg/dL (ref 0.0–0.3)
TOTAL PROTEIN: 8 g/dL (ref 6.0–8.3)

## 2014-04-11 LAB — VALPROIC ACID LEVEL: Valproic Acid Lvl: 82.3 ug/mL (ref 50.0–100.0)

## 2014-04-11 NOTE — Progress Notes (Signed)
Speech Language Pathology Daily Session Note  Patient Details  Name: Victor [name] MRN: [id number] Date of Birth: 1991/02/08  Today's Date: 04/11/2014 SLP Individual Time: 0900-1000 SLP Individual Time Calculation (min): 60 min  Short Term Goals: Week 3: SLP Short Term Goal 1 (Week 3): Patient will utilize swallowing compensatory strategies with Mod A multimodal cueing with current diet to minimize overt s/s of aspiration. SLP Short Term Goal 2 (Week 3): Patient with orient to place and situation with Max multimodal cues. SLP Short Term Goal 3 (Week 3): Patient will verbalize 1 physical and 1 cognitive deficit with Max A question cues. SLP Short Term Goal 4 (Week 3): Patient will attend to basic self-care tasks for 15 minutes with Min cues for redirections. SLP Short Term Goal 5 (Week 3): Patient will initate set-up with meals/snacks with Mod  Amultimodal cues.   Skilled Therapeutic Interventions: Skilled treatment session focused on cognitive-linguistic goals. SLP facilitated session by providing Max A multimodal cues for orientation to time, place and situation and total A for recall of information after ~60 second delay. Patient demonstrated verbal perseveration with language of confusion but was easily redirected with less verbal agitation today.  Patient sustained attention to a familiar card task for ~20 minutes with Min A multimodal cues for redirection but required total A multimodal cues for problem solving with task.  Patient also consumed a small snack of chocolate pudding with nectar-thick liquids and consumed without overt s/s of aspiration and required Mod verbal cues for small bites. Patient left in room with quick release in place and sitter present. Continue with current plan of care.    FIM:  Comprehension Comprehension Mode: Auditory Comprehension: 2-Understands basic 25 - 49% of the time/requires cueing 51 - 75% of the time Expression Expression Mode: Verbal Expression:  2-Expresses basic 25 - 49% of the time/requires cueing 50 - 75% of the time. Uses single words/gestures. Social Interaction Social Interaction: 2-Interacts appropriately 25 - 49% of time - Needs frequent redirection. Problem Solving Problem Solving: 1-Solves basic less than 25% of the time - needs direction nearly all the time or does not effectively solve problems and may need a restraint for safety Memory Memory: 1-Recognizes or recalls less than 25% of the time/requires cueing greater than 75% of the time  Pain Pain Assessment Pain Assessment: No/denies pain Pain Score: 0-No pain  Therapy/Group: Individual Therapy  Trenice Mesa 04/11/2014, 12:05 PM

## 2014-04-11 NOTE — Progress Notes (Signed)
Physical Therapy Weekly Progress Note  Patient Details  Name: Victor [name] MRN: [id number] Date of Birth: [birth year]  Beginning of progress report period: April 04, 2014 End of progress report period: April 11, 2014  Today's Date: 04/11/2014 PT Individual Time: 0800-0900 and 1300-1400  PT Individual Time Calculation (min): 60 min and 60 min  Patient has met 1 of 4 short term goals.  Patient has plateaued physically during this reporting period on rehab. Patient is able to perform functional transfers with mod-maxA of one helper, functional ambulation for 30-90' with +2 assist, and 5-6 stairs with B handrails and +2 assist. Patient continues to demonstrate significant truncal extension during movements, steppage/ataxic gait and stair negotiation, and limb apraxia (especially during stair negotiation). Currently, patient requires overall Total-Max A to utilize external aids for recall of orientation information and continues to demonstrate perseveration in regard to orientation to situation and place. Patient also requires Total-Max A for sustained attention, intellectual awareness, problem solving, working memory, and Surveyor, mining. Patient with minimal gains this reporting period and suspect his function is impacted by his participation/cooperation level and increased verbal agitation with functional tasks. Patient is currently demonstrating behaviors consistent with [place] Level V.  Patient continues to demonstrate the following deficits:  apraxia, L hemiplegia, decreased activity tolerance, poor balance and balance strategies in sitting and standing, decreased postural control during standing and transitional movements, decreased funcitonal use of all 4 extremities due to L hemiplegia and limb apraxia, decreased sustained attention, decreased safety awareness and awareness of deficits, decreased coordination, and decreased knowledge of precautions and therefore will continue to benefit from  skilled PT intervention to enhance overall performance with activity tolerance, balance, postural control, ability to compensate for deficits, functional use of  left upper extremity and left lower extremity, attention, awareness, coordination and knowledge of precautions.  Patient progressing toward long term goals..  Continue plan of care.  PT Short Term Goals Week 1:  PT Short Term Goal 1 (Week 1): Patient will perform bed mobility with minA. PT Short Term Goal 1 - Progress (Week 1): Met PT Short Term Goal 2 (Week 1): Patient will perform functional transfers with maxA x1. PT Short Term Goal 2 - Progress (Week 1): Met PT Short Term Goal 3 (Week 1): Patient will ambulate 20' with +2 assist. PT Short Term Goal 3 - Progress (Week 1): Met PT Short Term Goal 4 (Week 1): Patient will negotiate 2 steps with B handrails and +2 assist. PT Short Term Goal 4 - Progress (Week 1): Met Week 2:  PT Short Term Goal 1 (Week 2): Patient will perform bed mobility with supervision on flat bed. PT Short Term Goal 1 - Progress (Week 2): Met PT Short Term Goal 2 (Week 2): Patient will perform functional transfers with modA x1. PT Short Term Goal 2 - Progress (Week 2): Partly met (meets inconsistently) PT Short Term Goal 3 (Week 2): Patient will perform gait training 36' x1 with maxA x1. PT Short Term Goal 3 - Progress (Week 2): Progressing toward goal PT Short Term Goal 4 (Week 2): Patient will negotiate 5 steps with B handrails and maxA x1. PT Short Term Goal 4 - Progress (Week 2): Progressing toward goal Week 3:  PT Short Term Goal 1 (Week 3): Patient will perform bed mobility with supervision on flat bed. PT Short Term Goal 2 (Week 3): Patient will perform functional transfers with modA x1. PT Short Term Goal 3 (Week 3): Patient will perform gait training 15'  x1  with maxA x1. PT Short Term Goal 4 (Week 3): Patient will negotiate 5 steps with B handrails and maxA x1.  Skilled Therapeutic  Interventions/Progress Updates:    AM Session: Patient received semi-reclined in bed. Assisted patient with threading boxers then requires modA to stand from EOB to pull up pants. Session focused on sustained attention to therapeutic activities and redirection when patient becomes agitated. Stand pivot transfers performed with mod-maxA overall secondary to impulsivity, trunk extension, and poor motor planning. Wheelchair mobility 200' x1 with B LEs and minA, min cues for obstacle negotiation. Sitting EOM, B hands on physioball, anterior weight shifts to assist with sit>stand and maintaining anterior weight shift. Repeated sit<>stand from EOM using Eva walker upon standing. NuStep Level 4 with B UE/LE x8' for sustained attention. Patient left sitting in wheelchair with seatbelt donned and all needs within reach.  PM Session: Patient received sitting in wheelchair, uncle present. Attempted to engage uncle in family ed/training session, however, he states that he can't stay and leave the room. Uncle is found later in family room. Unsure why uncle stated he could not stay for family ed. Session focused on functional transfers, gait training, and NMR. Patient becomes significantly verbally agitated throughout session, able to be redirected 50% of the time. Patient purposefully "elbowing" therapist during gait training, stating he didn't like hand placement of therapist.   Functional transfers ranging from mod-maxA secondary to trunk extension and impulsivity. Gait training 25' x1 with R handrail and modA, +2 for wheelchair follow; 81' x1 with B HHA and +2. Patient continues to demonstrate steppage gait and increased trunk extension, which slightly improves with facilitated increased gait speed. Patient motivated to perform "sit ups and push ups". Push ups on knees in quadruped and sit ups in long sitting. Patient returned to room and left sitting in wheelchair with all needs within reach.  Therapy  Documentation Precautions:  Precautions Precautions: Fall Precaution Comments: abdominal binder over peg, R crani in abdomen Required Braces or Orthoses: Other Brace/Splint Cervical Brace: Other (comment) Other Brace/Splint: Cervical Collar D/C'ed Restrictions Weight Bearing Restrictions: No Other Position/Activity Restrictions: WBAT LLE Pain: Pain Assessment Pain Assessment: No/denies pain Pain Score: 0-No pain Locomotion : Ambulation Ambulation/Gait Assistance: Not tested (comment) Wheelchair Mobility Distance: 200   See FIM for current functional status  Therapy/Group: Individual Therapy  Lillia Abed. Carilyn Woolston, PT, DPT 04/11/2014, 11:57 AM

## 2014-04-11 NOTE — Progress Notes (Signed)
Physical Therapy Session Note  Patient Details  Name: Victor [name] MRN: [id number] Date of Birth: 1990-10-12  Today's Date: 04/11/2014 PT Individual Time: 1600-1630 PT Individual Time Calculation (min): 30 min   Short Term Goals: Week 3:  PT Short Term Goal 1 (Week 3): Patient will perform bed mobility with supervision on flat bed. PT Short Term Goal 2 (Week 3): Patient will perform functional transfers with modA x1. PT Short Term Goal 3 (Week 3): Patient will perform gait training 74'  x1 with maxA x1. PT Short Term Goal 4 (Week 3): Patient will negotiate 5 steps with B handrails and maxA x1.  Skilled Therapeutic Interventions/Progress Updates:  1:1. Pt received sitting in w/c, ready for therapy. Focus this session on functional w/c propulsion, sustained attention and therex. Pt demonstrating difficulty propelling basic w/c with B LE due to slightly increased seat height vs. Tilt in space leading to decreased foot contact with floor. Pt propelling w/c with B LE x30' and mod A, however, becoming increasingly frustrated due to difficulty of task despite accommodations to make task easier by scooting to edge of chair- lead to increased trunk extension. Pt utilized NuStep for B UE/LE strength and endurance, level 4x10 min with good tolerance and sustained attention to task in quiet gym. Pt req max-total cues to locate and read time function on NuStep, needed to lean forward for improved ability to see numbers. Pt req min A for squat pivot t/f w/c<>NuStep. Pt req mod A for w/c propulsion 50'x1 at end of session with slightly improved ability to generate reciprocal pattern. Pt left w/ all needs in reach, quick release belt in place and sitter in room.   Therapy Documentation Precautions:  Precautions Precautions: Fall Precaution Comments: abdominal binder over peg, R crani in abdomen Required Braces or Orthoses: Other Brace/Splint Cervical Brace: Other (comment) Other Brace/Splint: Cervical Collar  D/C'ed Restrictions Weight Bearing Restrictions: No Other Position/Activity Restrictions: WBAT LLE  See FIM for current functional status  Therapy/Group: Individual Therapy  Denzil Hughes 04/11/2014, 4:37 PM

## 2014-04-11 NOTE — Progress Notes (Signed)
Occupational Therapy Session Note  Patient Details  Name: Saahas Hidrogo MRN: [id number] Date of Birth: [birth year]  Today's Date: 04/11/2014 OT Individual Time: 1100-1200 OT Individual Time Calculation (min): 60 min    Short Term Goals: Week 3:  OT Short Term Goal 1 (Week 3): Patient will complete toilet transfer with mod assist OT Short Term Goal 2 (Week 3): Patient will consistently be oriented x3 with min cues OT Short Term Goal 3 (Week 3): Patient will demonstrate sustained attention to task for 5 min with min cues OT Short Term Goal 4 (Week 3): Patient will complete 2 grooming tasks in standing with min assist for balance  Skilled Therapeutic Interventions/Progress Updates:    Pt resting in w/c with sitter present.  Pt requested to bathe at shower level this morning.  Pt required max verbal cues and max A for stand pivot transfer w/c<>tub bench.  Pt required mod verbal cues to sequencing and thoroughness during bathing tasks.  Pt initiated dressing tasks when presented with clothing items but required assistance tieing shoe laces this morning.  Pt continues to exhibit posterior lean when standing and requires max A to maintain standing balance.  Pt not oriented to place or situation during this session.  Focus on activity tolerance, cognitive remediation, sit<>stand, standing balance, transfers, task initiation, sequencing, and attention to task.  Therapy Documentation Precautions:  Precautions Precautions: Fall Precaution Comments: abdominal binder over peg, R crani in abdomen Required Braces or Orthoses: Other Brace/Splint Cervical Brace: Other (comment) Other Brace/Splint: Cervical Collar D/C'ed Restrictions Weight Bearing Restrictions: No Other Position/Activity Restrictions: WBAT LLE   Pain: Pain Assessment Pain Assessment: No/denies pain Pain Score: 0-No pain  See FIM for current functional status  Therapy/Group: Individual Therapy  [name] 04/11/2014,  12:10 PM
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
## 2014-04-12 ENCOUNTER — Inpatient Hospital Stay (HOSPITAL_COMMUNITY): Payer: Medicaid Other

## 2014-04-12 DIAGNOSIS — S069X9A Unspecified intracranial injury with loss of consciousness of unspecified duration, initial encounter: Secondary | ICD-10-CM

## 2014-04-12 DIAGNOSIS — W19XXXA Unspecified fall, initial encounter: Secondary | ICD-10-CM

## 2014-04-12 DIAGNOSIS — S069XAA Unspecified intracranial injury with loss of consciousness status unknown, initial encounter: Secondary | ICD-10-CM
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
## 2014-04-12 NOTE — Progress Notes (Signed)
Occupational Therapy Session Note  Patient Details  Name: Victor [name] MRN: [id number] Date of Birth: 05-25-1991  Today's Date: 04/12/2014 OT Individual Time: 8315-1761 OT Individual Time Calculation (min): 45 min    Short Term Goals: Week 3:  OT Short Term Goal 1 (Week 3): Patient will complete toilet transfer with mod assist OT Short Term Goal 2 (Week 3): Patient will consistently be oriented x3 with min cues OT Short Term Goal 3 (Week 3): Patient will demonstrate sustained attention to task for 5 min with min cues OT Short Term Goal 4 (Week 3): Patient will complete 2 grooming tasks in standing with min assist for balance  Skilled Therapeutic Interventions/Progress Updates: ADL retraining at w/c level, sink, with +2 helper only for assist with standing.   Pt oriented to task and performed seated bathing and dressing with moderate cues to sustain attention and redirection to sequence due to perseveration with washing his legs.   Pt stood supported to allow assist with lower body dressing after repeated redirection to complete dressing despite mild objections due to fatigue.   Pt demo'd overall improved use of LUE during task, incorporating left hand while donning socks and shirt without need for cues this session.   Pt returned to bed at end of session as sitter returned to room.     Therapy Documentation Precautions:  Precautions Precautions: Fall Precaution Comments: abdominal binder over peg, R crani in abdomen Required Braces or Orthoses: Other Brace/Splint Cervical Brace: Other (comment) Other Brace/Splint: Cervical Collar D/C'ed Restrictions Weight Bearing Restrictions: No Other Position/Activity Restrictions: WBAT LLE  Pain: Pain Assessment Pain Assessment: No/denies pain  See FIM for current functional status  Therapy/Group: Individual Therapy  Anav Lammert 04/12/2014, 12:51 PM

## 2014-04-13 ENCOUNTER — Inpatient Hospital Stay (HOSPITAL_COMMUNITY): Payer: Medicaid Other | Admitting: *Deleted

## 2014-04-13 NOTE — Progress Notes (Signed)
Physical Therapy Session Note  Patient Details  Name: Victor [name] MRN: [id number] Date of Birth: 1990/10/05  Today's Date: 04/13/2014 PT Individual Time: 0800-0900 PT Individual Time Calculation (min): 60 min   Short Term Goals: Week 3:  PT Short Term Goal 1 (Week 3): Patient will perform bed mobility with supervision on flat bed. PT Short Term Goal 2 (Week 3): Patient will perform functional transfers with modA x1. PT Short Term Goal 3 (Week 3): Patient will perform gait training 32'  x1 with maxA x1. PT Short Term Goal 4 (Week 3): Patient will negotiate 5 steps with B handrails and maxA x1.  Skilled Therapeutic Interventions/Progress Updates:    Patient received semi-reclined in bed. Session focused on sustained attention with functional mobility and therapeutic activities, additional emphasis on redirection with increased agitation. Patient able to come to sitting EOB with supervision, maxA for bed>wheelchair squat pivot transfer secondary to trunk extension. Cushion removed for wheelchair mobility >200' x2 with B LEs and minA for obstacle negotiation and small inclines. Gait training in controlled environment 22' x2 with B HHA and modA x2. Patient continues to demonstrate steppage gait, ataxic gait, and significant trunk extension, requiring 2 people to assist.   Patient with bouts of significant agitation throughout session, raising voice to therapist, swatting away with hands; requires significant redirection. Patient refused to perform any additional standing or walking therefore, patient engaged in sitting on elevated treatment mat, shooting basketball at hoop, catching/throwing basketball, catching bounce passes, reaching for basketball laterally and anteriorly as well as rotating to reach for basketball placed behind him on either side. Transitioned to tossing football and attempting to count tosses, however, patient unable to do so without totalA cues for recall. Patient left sitting in  wheelchair with seatbelt donned and all needs within reach.  Therapy Documentation Precautions:  Precautions Precautions: Fall Precaution Comments: abdominal binder over peg, R crani in abdomen Required Braces or Orthoses: Other Brace/Splint Cervical Brace: Other (comment) Other Brace/Splint: Cervical Collar D/C'ed Restrictions Weight Bearing Restrictions: No Other Position/Activity Restrictions: WBAT LLE Pain: Pain Assessment Pain Assessment: No/denies pain Pain Score: 0-No pain Locomotion : Ambulation Ambulation/Gait Assistance: 1: +2 Total assist Wheelchair Mobility Distance: 200   See FIM for current functional status  Therapy/Group: Individual Therapy  [name] Raylei Losurdo, PT, DPT 04/13/2014, 8:58 AM
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
## 2014-04-13 NOTE — Plan of Care (Signed)
Problem: RH BLADDER ELIMINATION Goal: RH STG MANAGE BLADDER WITH ASSISTANCE STG Manage Bladder With total Assistance  Outcome: Progressing Wears condom cath at times (as patient allows).

## 2014-04-14 ENCOUNTER — Inpatient Hospital Stay (HOSPITAL_COMMUNITY): Payer: Medicaid Other | Admitting: Speech Pathology

## 2014-04-14 ENCOUNTER — Encounter (HOSPITAL_COMMUNITY): Payer: Self-pay | Admitting: *Deleted

## 2014-04-14 ENCOUNTER — Inpatient Hospital Stay (HOSPITAL_COMMUNITY): Payer: Medicaid Other | Admitting: *Deleted

## 2014-04-14 ENCOUNTER — Inpatient Hospital Stay (HOSPITAL_COMMUNITY): Payer: Medicaid Other

## 2014-04-14 MED ORDER — GLUCERNA SHAKE PO LIQD
237.0000 mL | Freq: Every day | ORAL | Status: DC
  Administered 2014-04-16 – 2014-04-22 (×3): 237 mL via ORAL

## 2014-04-14 NOTE — Progress Notes (Signed)
Occupational Therapy Session Note  Patient Details  Name: Victor [name] MRN: [id number] Date of Birth: [birth year]  Today's Date: 04/14/2014 OT Individual Time: 1300-1330 OT Individual Time Calculation (min): 30 min    Short Term Goals: Week 3:  OT Short Term Goal 1 (Week 3): Patient will complete toilet transfer with mod assist OT Short Term Goal 2 (Week 3): Patient will consistently be oriented x3 with min cues OT Short Term Goal 3 (Week 3): Patient will demonstrate sustained attention to task for 5 min with min cues OT Short Term Goal 4 (Week 3): Patient will complete 2 grooming tasks in standing with min assist for balance  Skilled Therapeutic Interventions/Progress Updates: Therapeutic activity with emphasis on improved transfers (w/c <> bed), BUE coordination, LUE functional use, and sustained attention.    Pt received seated in w/c with RN attending.   Pt expressed desire to know his body weight which required transfer to/from bed from/to w/c.   Pt performed right scoot-squat transfer with mod assist to facilitate anterior weight shifting and mod cues to sequence task.   Pt was weighed at 207 lbs and returned to w/c, performing stand pivot transfer with mod assist to and manual facilitation to for trunk control and righting.    At w/c level, pt was escorted to gym and completed 10 min of seated boxing using both right and left UE with LUE at diminished level.   With continuous instructional cues and hand guidance, pt used left hand to complete jab, hook, uppercut and to provide guard for face/head while punching with his right UE.   Pt sustained attention to task undistracted by stimulating environment.       Therapy Documentation Precautions:  Precautions Precautions: Fall Precaution Comments: abdominal binder over peg, R crani in abdomen Required Braces or Orthoses: Other Brace/Splint Cervical Brace: Other (comment) Other Brace/Splint: Cervical Collar D/C'ed Restrictions Weight Bearing  Restrictions: No Other Position/Activity Restrictions: WBAT LLE  Vital Signs: Therapy Vitals Pulse Rate: 70 BP: 134/82 mmHg  Pain: Pain Assessment Pain Assessment: No/denies pain  See FIM for current functional status  Therapy/Group: Individual Therapy  Kariann Wecker 04/14/2014, 2:17 PM
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
## 2014-04-14 NOTE — Progress Notes (Signed)
Physical Therapy Session Note  Patient Details  Name: Victor [name] MRN: [id number] Date of Birth: 1991/03/12  Today's Date: 04/14/2014 PT Individual Time: 4081-4481 PT Individual Time Calculation (min): 30 min   Short Term Goals: Week 3:  PT Short Term Goal 1 (Week 3): Patient will perform bed mobility with supervision on flat bed. PT Short Term Goal 2 (Week 3): Patient will perform functional transfers with modA x1. PT Short Term Goal 3 (Week 3): Patient will perform gait training 18'  x1 with maxA x1. PT Short Term Goal 4 (Week 3): Patient will negotiate 5 steps with B handrails and maxA x1.  Skilled Therapeutic Interventions/Progress Updates:  Patient sitting in wheelchair upon entering room with sitter and uncle present. Patient reports headache and nursing bringing in tylenol. Patient was pushed in wheelchair to gym. Patient sit to stand and ambulated 100 feet x 1 and 200 feet x 1 with +2 HHA. Patient requires facilitation of trunk flexion to maintain weight shift forward during ambulation. Patient improving but still with ataxic steppage gait.  Patient left in wheelchair in room with sitter and uncle. Therapy Documentation Precautions:  Precautions Precautions: Fall Precaution Comments: abdominal binder over peg, R crani in abdomen Required Braces or Orthoses: Other Brace/Splint Cervical Brace: Other (comment) Other Brace/Splint: Cervical Collar D/C'ed Restrictions Weight Bearing Restrictions: No Other Position/Activity Restrictions: WBAT LLE Pain: Pain Assessment Pain Assessment: 0-10 Pain Score: 8  Pain Location: Head Pain Descriptors / Indicators: Headache Locomotion : Ambulation Ambulation/Gait Assistance: 1: +2 Total assist   See FIM for current functional status  Therapy/Group: Individual Therapy  Arelia Longest M 04/14/2014, 3:26 PM

## 2014-04-14 NOTE — Progress Notes (Signed)
Physical Therapy Session Note  Patient Details  Name: Victor [name] MRN: [id number] Date of Birth: 1990-11-04  Today's Date: 04/14/2014 PT Individual Time: 6387-5643 and 3295-1884 PT Individual Time Calculation (min): 43 min and 25 min  Short Term Goals: Week 3:  PT Short Term Goal 1 (Week 3): Patient will perform bed mobility with supervision on flat bed. PT Short Term Goal 2 (Week 3): Patient will perform functional transfers with modA x1. PT Short Term Goal 3 (Week 3): Patient will perform gait training 40'  x1 with maxA x1. PT Short Term Goal 4 (Week 3): Patient will negotiate 5 steps with B handrails and maxA x1.  Skilled Therapeutic Interventions/Progress Updates:    AM Session: Patient received semi-reclined in bed. Session focused on initiation of family education/training for functional mobility with patient's uncle. Patient requires max cues for redirection when becoming agitated several times throughout session. Agitation appearing to be triggered by excessive talking/education between therapist and uncle.   Education/discussion about recommendations upon discharge, including, 24/7 assist/supervision, wheelchair level mobility, f/u therapy, etc. Patient's uncle with questions about diet and toileting. Deferred to primary OT and SLP, but this therapist educated about basic ADLs and impact of cognition on current diet. Uncle with questions about patient's diet, asking "Is he on a special diet because of his BP?"  Uncle asking "when patient can walk alone and use bathroom alone"; discussion that recovery can vary, but that patient is recommended to be at wheelchair level upon discharge and could remain at this level for several weeks or months. Discussion/education and demonstration of proper set up for transfers and after one demonstration, uncle declining to participate to perform hands on. Patient continues to escalate stating "you're talking too much." Patient returned to room secondary to  increasing agitation and left sitting in wheelchair with seatbelt donned and sitter and uncle present.  PM Session: Patient received sitting in wheelchair. Session focused on functional ambulation, stair negotiation, and wheelchair mobility. Patient declines and agrees to walking throughout session. Finally agreeable and patient performed ambulation 18' x2 with R handrail and modA x1 with several instances of maxA for lateral LOB to the R, +2 for wheelchair follow. Stair negotiation x3 steps with B handrails, ascends forwards, descends backwards with intermittent step to/alternating pattern. Max multimodal cues for sequencing and proper foot placement. Wheelchair mobility x200' with B LEs and close supervision, max cues for negotiation of obstacles on R side. Patient left sitting in wheelchair with seatbelt donned and sitter present; all needs within reach.  Therapy Documentation Precautions:  Precautions Precautions: Fall Precaution Comments: abdominal binder over peg, R crani in abdomen Required Braces or Orthoses: Other Brace/Splint Cervical Brace: Other (comment) Other Brace/Splint: Cervical Collar D/C'ed Restrictions Weight Bearing Restrictions: No Other Position/Activity Restrictions: WBAT LLE General: PT Amount of Missed Time (min): 17 Minutes; 20 minutes of pm session PT Missed Treatment Reason: Increased agitation (requires time to de-escalate) Pain: Pain Assessment Pain Assessment: No/denies pain Pain Score: 0-No pain Locomotion : Ambulation Ambulation/Gait Assistance: Not tested (comment)   See FIM for current functional status  Therapy/Group: Individual Therapy  [name] Daisia Slomski, PT, DPT 04/14/2014, 9:30 AM

## 2014-04-14 NOTE — Progress Notes (Addendum)
Occupational Therapy Session Note  Patient Details  Name: Victor [name] MRN: [id number] Date of Birth: 1991-06-21  Today's Date: 04/14/2014 OT Individual Time: 1000-1045 OT Individual Time Calculation (min): 45 min    Short Term Goals: Week 3:  OT Short Term Goal 1 (Week 3): Patient will complete toilet transfer with mod assist OT Short Term Goal 2 (Week 3): Patient will consistently be oriented x3 with min cues OT Short Term Goal 3 (Week 3): Patient will demonstrate sustained attention to task for 5 min with min cues OT Short Term Goal 4 (Week 3): Patient will complete 2 grooming tasks in standing with min assist for balance  Skilled Therapeutic Interventions/Progress Updates:    Pt engaged in BADL retraining including bathing at shower level and dressing with sit<>stand from w/c.  Pt required max A for w/c<>tub bench transfers with max A for safety awareness.  Pt required max A for task initiation and thoroughness during bathing.  Pt incontinent of bowel during shower and unaware.  Pt adamantly denied he had a bowel movement.  Pt initiated all dressing tasks when presented with clothing items.  Pt requires max A for standing balance when pulling up pants.  Pt unable to correct balance when requested and requires tot A to correct balance.  Focus on task initiation, sequencing, cognitive remediation, sit<>stand, transfers, standing balance, and activity tolerance. Pt's uncle was present when therapist entered room but stated he needed to go home to sleep and did not participate in therapy.  Therapy Documentation Precautions:  Precautions Precautions: Fall Precaution Comments: abdominal binder over peg, R crani in abdomen Required Braces or Orthoses: Other Brace/Splint Cervical Brace: Other (comment) Other Brace/Splint: Cervical Collar D/C'ed Restrictions Weight Bearing Restrictions: No Other Position/Activity Restrictions: WBAT LLE Pain: Pain Assessment Pain Assessment: No/denies  pain  See FIM for current functional status  Therapy/Group: Individual Therapy  [name] 04/14/2014, 10:47 AM
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
## 2014-04-14 NOTE — Progress Notes (Signed)
Social Work Patient ID: Victor [name], male   DOB: 1990-11-07, 23 y.o.   MRN: [id number]  Have spoken with pt's uncle, Ginnie Smart, today to review d/c care needs of patient and better determine what his thoughts are on his ability to meet these care needs upon d/c.  Stressed to uncle that, per therapists, pt will continue to need assist with ALL aspects of care including toileting management.  Uncle does admit that he "...had hoped he'd be able to go to the bathroom by himself..." and be supervision with mobility.  Recommended that uncle needs to consider change of d/c plan to SNF if he feels he cannot provide adequate care and unlce is agreed to do just that.  Have alerted tx team and MD/ PA of change of plan to SNF.  Will begin bed search and cancel originally anticipated d/c date of 9/29.  On another note, I was able to speak with pt's parole officer, Officer Patzke, at the end of last week and confirmed that he is aware that pt is at our hospital and plans to visit pt this week.  This officer was able to provide some background on familial relationships of pt and feels that "... The best thing would be for him to be in a care facility..".  I have also tried to determine who is patient's current guardian and have spoken with [place] [place] [employer] who reports they had been assigned guardian until August and did not know who was current guardian.  Parole officer able to report that uncle's prior girlfriend had been guardian at one point and then "left town".  It appears that pt currently without anyone assigned.  Ofc. Patzke to contact a prior guardian of pt to determine what may need to be done at this point.  Will continue to work on this as well.  Itxel Wickard, LCSW

## 2014-04-14 NOTE — Progress Notes (Signed)
Speech Language Pathology Daily Session Note  Patient Details  Name: Victor [name] MRN: [id number] Date of Birth: 09-Jun-1991  Today's Date: 04/14/2014 SLP Individual Time: 1100-1155 SLP Individual Time Calculation (min): 55 min  Short Term Goals: Week 3: SLP Short Term Goal 1 (Week 3): Patient will utilize swallowing compensatory strategies with Mod A multimodal cueing with current diet to minimize overt s/s of aspiration. SLP Short Term Goal 2 (Week 3): Patient with orient to place and situation with Max multimodal cues. SLP Short Term Goal 3 (Week 3): Patient will verbalize 1 physical and 1 cognitive deficit with Max A question cues. SLP Short Term Goal 4 (Week 3): Patient will attend to basic self-care tasks for 15 minutes with Min cues for redirections. SLP Short Term Goal 5 (Week 3): Patient will initate set-up with meals/snacks with Mod  Amultimodal cues.   Skilled Therapeutic Interventions: Skilled treatment session focused on cognitive-linguistic and dysphagia goals. SLP facilitated session by providing Max-total A multimodal cues for orientation to time, place and situation and total A for recall of information after ~60 second delay. Patient demonstrated verbal perseveration with language of confusion but was easily redirected with intermittent verbal agitation today.  Patient sustained attention to a basic sorting task for ~ 5 minutes with supervision multimodal cues for redirection and problem solving with task.  Patient also performed oral reading task at the word level with 50% accuracy, suspect inconsistency is due to cognitive deficits and suspected visual deficits. Patient consumed a small snack of Dys. 1 textures with nectar-thick liquids without overt s/s of aspiration and required Min verbal cues for utilization of small bites. Patient left in room with quick release belt in place and sitter present. Continue with current plan of care.   FIM:  Comprehension Comprehension Mode:  Auditory Comprehension: 2-Understands basic 25 - 49% of the time/requires cueing 51 - 75% of the time Expression Expression Mode: Verbal Expression: 2-Expresses basic 25 - 49% of the time/requires cueing 50 - 75% of the time. Uses single words/gestures. Social Interaction Social Interaction: 2-Interacts appropriately 25 - 49% of time - Needs frequent redirection. Problem Solving Problem Solving: 2-Solves basic 25 - 49% of the time - needs direction more than half the time to initiate, plan or complete simple activities Memory Memory: 1-Recognizes or recalls less than 25% of the time/requires cueing greater than 75% of the time FIM - Eating Eating Activity: 5: Supervision/cues  Pain Pain Assessment Pain Assessment: No/denies pain   Therapy/Group: Individual Therapy  Jia Dottavio 04/14/2014, 3:47 PM

## 2014-04-15 ENCOUNTER — Inpatient Hospital Stay (HOSPITAL_COMMUNITY): Payer: Medicaid Other

## 2014-04-15 ENCOUNTER — Inpatient Hospital Stay (HOSPITAL_COMMUNITY): Payer: Medicaid Other | Admitting: Speech Pathology

## 2014-04-15 ENCOUNTER — Encounter (HOSPITAL_COMMUNITY): Payer: Self-pay

## 2014-04-15 ENCOUNTER — Inpatient Hospital Stay (HOSPITAL_COMMUNITY): Payer: Medicaid Other | Admitting: *Deleted

## 2014-04-15 NOTE — Progress Notes (Signed)
Speech Language Pathology Daily Session Note  Patient Details  Name: Victor [name] MRN: [id number] Date of Birth: 14-Sep-1990  Today's Date: 04/15/2014 SLP Individual Time: 0800-0900 SLP Individual Time Calculation (min): 60 min  Short Term Goals: Week 3: SLP Short Term Goal 1 (Week 3): Patient will utilize swallowing compensatory strategies with Mod A multimodal cueing with current diet to minimize overt s/s of aspiration. SLP Short Term Goal 2 (Week 3): Patient with orient to place and situation with Max multimodal cues. SLP Short Term Goal 3 (Week 3): Patient will verbalize 1 physical and 1 cognitive deficit with Max A question cues. SLP Short Term Goal 4 (Week 3): Patient will attend to basic self-care tasks for 15 minutes with Min cues for redirections. SLP Short Term Goal 5 (Week 3): Patient will initate set-up with meals/snacks with Mod  Amultimodal cues.   Skilled Therapeutic Interventions: Skilled treatment session focused on cognitive-linguistic goals. Student facilitated session by providing Max A multimodal cues for orientation to place and situation and total A for recall of scheduled therapy sessions after ~30 second delay. Initially, patient demonstrated verbal agitation with activity but was easily redirected. Patient sustained attention to a basic card game for 45 minutes with Mod I but required Mod-Max multimodal cues for problem solving with color and number identification, suspect difficulty due to decreased attention and suspected visual deficits. Patient left in room with quick release belt in place and sitter present. Continue with current plan of care.   FIM:  Comprehension Comprehension Mode: Auditory Comprehension: 3-Understands basic 50 - 74% of the time/requires cueing 25 - 50%  of the time Expression Expression Mode: Verbal Expression: 3-Expresses basic 50 - 74% of the time/requires cueing 25 - 50% of the time. Needs to repeat parts of sentences. Social  Interaction Social Interaction: 3-Interacts appropriately 50 - 74% of the time - May be physically or verbally inappropriate. Problem Solving Problem Solving: 2-Solves basic 25 - 49% of the time - needs direction more than half the time to initiate, plan or complete simple activities Memory Memory: 1-Recognizes or recalls less than 25% of the time/requires cueing greater than 75% of the time  Pain Pain Assessment Pain Assessment: No/denies pain Pain Score: 0-No pain  Therapy/Group: Individual Therapy  Cortney Beissel 04/15/2014, 11:52 AM

## 2014-04-15 NOTE — Progress Notes (Signed)
Physical Therapy Session Note  Patient Details  Name: Victor [name] MRN: [id number] Date of Birth: 1990-08-31  Today's Date: 04/15/2014 PT Individual Time: 1400-1445 PT Individual Time Calculation (min): 45 min   Short Term Goals: Week 3:  PT Short Term Goal 1 (Week 3): Patient will perform bed mobility with supervision on flat bed. PT Short Term Goal 2 (Week 3): Patient will perform functional transfers with modA x1. PT Short Term Goal 3 (Week 3): Patient will perform gait training 58'  x1 with maxA x1. PT Short Term Goal 4 (Week 3): Patient will negotiate 5 steps with B handrails and maxA x1.  Skilled Therapeutic Interventions/Progress Updates:  1:1. Pt received sitting in w/c with sitter present, ready for therapy. Focus this session on functional endurance, selective attention and dynamic balance. Pt req mod A for w/c propulsion 180'x2 w/ B LE, demonstrates gallop pattern. Min A for lateral scoot t/f w/c<>tx mat. Pt intermittently becomes slightly agitated throughout session, but easily redirected with rest and change in topic. Pt engaged in dynamic standing task to facilitate WB tolerance, B weight shifting and anterior weight shift by reaching for horseshoes on low bench or from rim of basketball hoop positioned both laterally and anteriorly. Mod L HHA for balance during task. Pt req B HHA for ambulation 180'x1 back to room with Ax2 persons, min A for t/f sit>sup at end of session. Pt left semi-reclined in bed w/ all needs in reach, bed alarm on and sitter in room.   Therapy Documentation Precautions:  Precautions Precautions: Fall Precaution Comments: abdominal binder over peg, R crani in abdomen Required Braces or Orthoses: Other Brace/Splint Cervical Brace: Other (comment) Other Brace/Splint: Cervical Collar D/C'ed Restrictions Weight Bearing Restrictions: No Other Position/Activity Restrictions: WBAT LLE  See FIM for current functional status  Therapy/Group: Individual  Therapy  Denzil Hughes 04/15/2014, 2:53 PM
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
## 2014-04-15 NOTE — Progress Notes (Signed)
The skilled treatment note has been reviewed and SLP is in agreement.  Alejo Beamer, M.A., CCC-SLP  [phone]   

## 2014-04-15 NOTE — Progress Notes (Signed)
Occupational Therapy Session Note  Patient Details  Name: Victor [name] MRN: [id number] Date of Birth: Jan 05, 1991  Today's Date: 04/15/2014 OT Individual Time: 1100-1200 OT Individual Time Calculation (min): 60 min    Short Term Goals: Week 3:  OT Short Term Goal 1 (Week 3): Patient will complete toilet transfer with mod assist OT Short Term Goal 2 (Week 3): Patient will consistently be oriented x3 with min cues OT Short Term Goal 3 (Week 3): Patient will demonstrate sustained attention to task for 5 min with min cues OT Short Term Goal 4 (Week 3): Patient will complete 2 grooming tasks in standing with min assist for balance  Skilled Therapeutic Interventions/Progress Updates:    Pt engaged in BADL retraining including bathing at shower level and dressing with sit<>stand from w/c.  Pt perseverated on asking what the time was and wanting to go to gym during this session.  Pt oriented to place but not situation or time.  Pt initiated all bathing and dressing tasks when presented with supplies and clothing.  Pt required max verbal cues for redirection throughout session when patient would perseverate on unrelated topics.  Pt requires max A for sit<>stand and standing balance.  Pt requires max A for stand pivot transfers.  Focus on activity tolerance, transfers, sit<>stand, task initiation, attention to task, sequencing, standing balance, and safety awareness.  Therapy Documentation Precautions:  Precautions Precautions: Fall Precaution Comments: abdominal binder over peg, R crani in abdomen Required Braces or Orthoses: Other Brace/Splint Cervical Brace: Other (comment) Other Brace/Splint: Cervical Collar D/C'ed Restrictions Weight Bearing Restrictions: No Other Position/Activity Restrictions: WBAT LLE   Pain: Pain Assessment Pain Assessment: No/denies pain Pain Score: 0-No pain  See FIM for current functional status  Therapy/Group: Individual Therapy  [name] 04/15/2014, 12:07 PM

## 2014-04-15 NOTE — Progress Notes (Signed)
Physical Therapy Session Note  Patient Details  Name: Victor [name] MRN: [id number] Date of Birth: 1990/11/13  Today's Date: 04/15/2014 PT Individual Time: 0900-1000 and 1626-1700 PT Individual Time Calculation (min): 60 min and 34 min  Short Term Goals: Week 3:  PT Short Term Goal 1 (Week 3): Patient will perform bed mobility with supervision on flat bed. PT Short Term Goal 2 (Week 3): Patient will perform functional transfers with modA x1. PT Short Term Goal 3 (Week 3): Patient will perform gait training 54'  x1 with maxA x1. PT Short Term Goal 4 (Week 3): Patient will negotiate 5 steps with B handrails and maxA x1.  Skilled Therapeutic Interventions/Progress Updates:    AM Session: Patient received sitting in wheelchair. Session focused on functional transfers, sitting balance, gait training, and cognitive remediation. Sitting EOM, patient participated in War card game with min cues for initial instruction, mod cues throughout to take/leave cards appropriately and min cues for accuracy with which card is higher. Patient requires min cues for sustained attention to game when attempting to talk about unrelated topics. Gait training x12' with B HHA and modA of 2, however, gait training ended secondary to condom cath removal and bowel incontinence. Patient returned to room and handed off to nurse techs for hygiene while seated in wheelchair.  PM Session: Patient received semi-reclined in bed. Session focused on functional transfers, wheelchair mobility, and LE NMR. Supine>sit with HOB elevated and use of bed rails with supervision, maxA for squat pivot transfer wheelchair>bed. Wheelchair mobility >150' with B UEs and intermittent use of B LEs and minA. Emphasis on attention to L hand and use during wheelchair mobility. Seated Kinetron activity x10'. Patient becoming agitated when re-oriented to time and situation during therapeutic conversation. Patient returned to room and left sitting in wheelchair  with seatbelt donned and all needs within reach; sitter present.  Therapy Documentation Precautions:  Precautions Precautions: Fall Precaution Comments: abdominal binder over peg, R crani in abdomen Required Braces or Orthoses: Other Brace/Splint Cervical Brace: Other (comment) Other Brace/Splint: Cervical Collar D/C'ed Restrictions Weight Bearing Restrictions: No Other Position/Activity Restrictions: WBAT LLE General: PT Amount of Missed Time (min): 11 Minutes PT Missed Treatment Reason: Patient fatigue;Increased agitation Pain: Pain Assessment Pain Assessment: No/denies pain Pain Score: 0-No pain Locomotion : Ambulation Ambulation/Gait Assistance: 1: +2 Total assist   See FIM for current functional status  Therapy/Group: Individual Therapy  [name] Kyara Boxer, PT, DPT 04/15/2014, 5:15 PM

## 2014-04-16 ENCOUNTER — Inpatient Hospital Stay (HOSPITAL_COMMUNITY): Payer: Self-pay

## 2014-04-16 ENCOUNTER — Inpatient Hospital Stay (HOSPITAL_COMMUNITY): Payer: Self-pay | Admitting: Occupational Therapy

## 2014-04-16 ENCOUNTER — Inpatient Hospital Stay (HOSPITAL_COMMUNITY): Payer: Medicaid Other | Admitting: Speech Pathology

## 2014-04-16 ENCOUNTER — Encounter (HOSPITAL_COMMUNITY): Payer: Self-pay | Admitting: *Deleted

## 2014-04-16 ENCOUNTER — Inpatient Hospital Stay (HOSPITAL_COMMUNITY): Payer: Medicaid Other

## 2014-04-16 NOTE — Progress Notes (Signed)
Social Work Patient ID: Victor [name], male   DOB: [birth year], 23 y.o.   MRN: [id number]  Have reviewed team conf info with pt's uncle and he is aware that pt continues to make some gains but plan still for SNF from here.  Will keep team and uncle updated on placement prospects.  Rajat Staver, LCSW

## 2014-04-16 NOTE — Progress Notes (Signed)
Speech Language Pathology Daily Session Note  Patient Details  Name: Victor [name] MRN: [id number] Date of Birth: Aug 11, 1990  Today's Date: 04/16/2014 SLP Individual Time: 3754-3606 SLP Individual Time Calculation (min): 55 min  Short Term Goals: Week 3: SLP Short Term Goal 1 (Week 3): Patient will utilize swallowing compensatory strategies with Mod A multimodal cueing with current diet to minimize overt s/s of aspiration. SLP Short Term Goal 2 (Week 3): Patient with orient to place and situation with Max multimodal cues. SLP Short Term Goal 3 (Week 3): Patient will verbalize 1 physical and 1 cognitive deficit with Max A question cues. SLP Short Term Goal 4 (Week 3): Patient will attend to basic self-care tasks for 15 minutes with Min cues for redirections. SLP Short Term Goal 5 (Week 3): Patient will initate set-up with meals/snacks with Mod  Amultimodal cues.   Skilled Therapeutic Interventions: Skilled treatment session focused on cognitive-linguistic goals. Student facilitated session by providing supervision multimodal cues for sustained attention and Min A for problem solving during a basic and familiar task. Patient required supervision question cues for orientation to place, situation and age but required Min A for orientation to date. Patient also demonstrated intermittent language of confusion without agitation this session and was easily redirected. Continue with current plan of care.   FIM:  Comprehension Comprehension Mode: Auditory Comprehension: 3-Understands basic 50 - 74% of the time/requires cueing 25 - 50%  of the time Expression Expression Mode: Verbal Expression: 3-Expresses basic 50 - 74% of the time/requires cueing 25 - 50% of the time. Needs to repeat parts of sentences. Social Interaction Social Interaction: 3-Interacts appropriately 50 - 74% of the time - May be physically or verbally inappropriate. Problem Solving Problem Solving: 2-Solves basic 25 - 49% of the time  - needs direction more than half the time to initiate, plan or complete simple activities Memory Memory: 1-Recognizes or recalls less than 25% of the time/requires cueing greater than 75% of the time  Pain Pain Assessment Pain Assessment: No/denies pain  Therapy/Group: Individual Therapy  Chima Astorino 04/16/2014, 4:12 PM
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
## 2014-04-16 NOTE — Progress Notes (Signed)
Physical Therapy Session Note  Patient Details  Name: Victor [name] MRN: [id number] Date of Birth: January 23, 1991  Today's Date: 04/16/2014 PT Individual Time: 6063-0160 PT Individual Time Calculation (min): 45 min   Short Term Goals: Week 3:  PT Short Term Goal 1 (Week 3): Patient will perform bed mobility with supervision on flat bed. PT Short Term Goal 2 (Week 3): Patient will perform functional transfers with modA x1. PT Short Term Goal 3 (Week 3): Patient will perform gait training 55'  x1 with maxA x1. PT Short Term Goal 4 (Week 3): Patient will negotiate 5 steps with B handrails and maxA x1.  Skilled Therapeutic Interventions/Progress Updates:    Pt received seated in w/c, agreeable to participate in therapy. Pt propelled w/c 150' to rehab gym w/ MinA, mod VC's to avoid obstacles on R, intermittent assist for steering to maintain straight path and max VC's for sequencing during turns. Pt agitated during much of session, however agitation calmed during exercises. Pt stated he wanted to work on [employer] in the gym. Pt completed 4x10 LAQ and 2x10 marching w/ 5# weight on RLE and 4# weight LLE. 3x10 bicep curls w/ 5# weight BUE w/ MinA to complete exercise w/ LUE. Throughout pt given cues to count out loud for exercise reps, pt able to count up to 10 independently, began repeating numbers when counting 10-20. Pt engaged in football tossing activity for seated trunk control, sustained attention. Pt transported back to room via w/c, pt left seated in w/c w/ sitter present.   Therapy Documentation Precautions:  Precautions Precautions: Fall Precaution Comments: abdominal binder over peg, R crani in abdomen Required Braces or Orthoses: Other Brace/Splint Cervical Brace: Other (comment) Other Brace/Splint: Cervical Collar D/C'ed Restrictions Weight Bearing Restrictions: No Other Position/Activity Restrictions: WBAT LLE Pain: Pain Assessment Pain Assessment: No/denies pain  See FIM for current  functional status  Therapy/Group: Individual Therapy  Hosie Spangle Hosie Spangle, PT, DPT 04/16/2014, 5:31 PM
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
## 2014-04-16 NOTE — Progress Notes (Signed)
Occupational Therapy Session Note  Patient Details  Name: Jakylan Rhodd MRN: [id number] Date of Birth: 1991-05-12  Today's Date: 04/16/2014 OT Individual Time: 1100-1140 OT Individual Time Calculation (min): 40 min    Short Term Goals: Week 3:  OT Short Term Goal 1 (Week 3): Patient will complete toilet transfer with mod assist OT Short Term Goal 2 (Week 3): Patient will consistently be oriented x3 with min cues OT Short Term Goal 3 (Week 3): Patient will demonstrate sustained attention to task for 5 min with min cues OT Short Term Goal 4 (Week 3): Patient will complete 2 grooming tasks in standing with min assist for balance  Skilled Therapeutic Interventions/Progress Updates:    Pt engaged in BADL retraining including bathing at shower level and dressing with sit<>stand from w/c.  Pt initiated bathing tasks when presented with bathing supplies.  Pt required min verbal cues for redirection to task when bathing.  Pt continues to require max A for stand pivot transfers in/out of shower and exhibits continued impulsivity.  Pt required mod verbal cues for sequencing with dressing tasks and became agitated when therapist provided verbal cues.  Pt became increasingly agitated as session continued and session terminated early.  Focus on activity tolerance, frustration tolerance, sit<>stand, transfers, task initiation, sequencing, and safety awareness.  Therapy Documentation Precautions:  Precautions Precautions: Fall Precaution Comments: abdominal binder over peg, R crani in abdomen Required Braces or Orthoses: Other Brace/Splint Cervical Brace: Other (comment) Other Brace/Splint: Cervical Collar D/C'ed Restrictions Weight Bearing Restrictions: No Other Position/Activity Restrictions: WBAT LLE General: General OT Amount of Missed Time: 20 Minutes increased agitatoin Pain: Pain Assessment Pain Assessment: No/denies pain  See FIM for current functional status  Therapy/Group: Individual  Therapy  [name] 04/16/2014, 11:54 AM

## 2014-04-16 NOTE — Progress Notes (Signed)
Note/chart reviewed. Agree with note.   Davy Westmoreland RD, LDN, CNSC [phone] Pager [phone] After Hours Pager   

## 2014-04-16 NOTE — Progress Notes (Signed)
The skilled treatment note has been reviewed and SLP is in agreement.  Linnea Todisco, M.A., CCC-SLP  [phone]   

## 2014-04-16 NOTE — Progress Notes (Signed)
Note/chart reviewed. Agree with note.   Anjani Feuerborn RD, LDN, CNSC [phone] Pager [phone] After Hours Pager   

## 2014-04-16 NOTE — Progress Notes (Signed)
Physical Therapy Session Note  Patient Details  Name: Victor [name] MRN: [id number] Date of Birth: 29-May-1991  Today's Date: 04/16/2014 PT Individual Time: 1300-1330 PT Individual Time Calculation (min): 30 min   Short Term Goals: Week 3:  PT Short Term Goal 1 (Week 3): Patient will perform bed mobility with supervision on flat bed. PT Short Term Goal 2 (Week 3): Patient will perform functional transfers with modA x1. PT Short Term Goal 3 (Week 3): Patient will perform gait training 110'  x1 with maxA x1. PT Short Term Goal 4 (Week 3): Patient will negotiate 5 steps with B handrails and maxA x1.  Skilled Therapeutic Interventions/Progress Updates:    Session focused on functional mobility training including gait, w/c propulsion, and stair negotiation while addressing cognition during these tasks. Pt required +2 assist for gait training with 2 cones presented as obstacles and a target for distance x 2 repetitions through course 50' x 2; pt with heavy posterior and lateral lean at times requiring heavy mod A +2 and cues for step length and posture. Up/down 4 steps using bilateral rails and +2 assist for functional mobility training and general strengthening with cueing for controlling of movement patterns in LE's. Returned back to room with safety belt in place and sitter present.  Therapy Documentation Precautions:  Precautions Precautions: Fall Precaution Comments: abdominal binder over peg, R crani in abdomen Required Braces or Orthoses: Other Brace/Splint Cervical Brace: Other (comment) Other Brace/Splint: Cervical Collar D/C'ed Restrictions Weight Bearing Restrictions: No Other Position/Activity Restrictions: WBAT LLE  Pain:  No complaints of pain.  See FIM for current functional status  Therapy/Group: Individual Therapy  Karolee Stamps [facility] 04/16/2014, 3:16 PM

## 2014-04-16 NOTE — Progress Notes (Signed)
The skilled treatment note has been reviewed and SLP is in agreement.  Rosabelle Jupin, M.A., CCC-SLP  [phone]   

## 2014-04-17 ENCOUNTER — Inpatient Hospital Stay (HOSPITAL_COMMUNITY): Payer: Self-pay | Admitting: Speech Pathology

## 2014-04-17 ENCOUNTER — Inpatient Hospital Stay (HOSPITAL_COMMUNITY): Payer: Medicaid Other | Admitting: *Deleted

## 2014-04-17 ENCOUNTER — Encounter (HOSPITAL_COMMUNITY): Payer: Self-pay

## 2014-04-17 ENCOUNTER — Inpatient Hospital Stay (HOSPITAL_COMMUNITY): Payer: Self-pay

## 2014-04-17 MED ORDER — BACLOFEN 5 MG HALF TABLET
5.0000 mg | ORAL_TABLET | Freq: Three times a day (TID) | ORAL | Status: DC
  Administered 2014-04-17 – 2014-04-25 (×25): 5 mg via ORAL
  Filled 2014-04-17 (×29): qty 1

## 2014-04-17 NOTE — Plan of Care (Signed)
Problem: RH SAFETY Goal: RH STG ADHERE TO SAFETY PRECAUTIONS W/ASSISTANCE/DEVICE STG Adhere to Safety Precautions With total Assistance/Device.  Outcome: Progressing Has poor safety awareness, needs lots of verbal cues and reminders.

## 2014-04-17 NOTE — Progress Notes (Signed)
Physical Therapy Session Note  Patient Details  Name: Victor [name] MRN: [id number] Date of Birth: 1990-11-04  Today's Date: 04/17/2014 PT Individual Time: 1540-1640 PT Individual Time Calculation (min): 60 min   Short Term Goals: Week 4:  PT Short Term Goal 1 (Week 4): Patient will perform functional transfers with modA x1. PT Short Term Goal 2 (Week 4): Patient will perform gait training 30' x1 with maxA. PT Short Term Goal 3 (Week 4): Patient will negotiate 3 steps with B handrails and maxA x1  Skilled Therapeutic Interventions/Progress Updates:  1:1. Pt received semi-reclined in bed, ready for therapy. Focus this session on cognitive remediation, functional mobility, balance and B UE coordination. Pt req supervision for t/f sup>sit EOB w/ use of hospital bed functions. Pt able to amb 200'x1 w/ B HHA x2persons, pt with good response to verbal cues for increased hip flexion to facilitate improved balance WB though midfoot vs. Heels. Pt demonstrating improved L heel first contact, but continues to demonstrate steppage pattern with R LE. Pt req intermittent min cues throughout session for redirection to functional tasks due to perseveration on eating. Pt actively engaged in 4 rounds of connect four to facilitate sustained>selective attention, problem solving and mental flexibility, no cues provided for turn taking or problem solving. With tx mat elevated to remove B LE contact with floor, pt engaged in basketball toss at trampoline, reaching outside BOS and tossing yellow weighted ball with therapist to target core activation, balance, use of L UE and B UE coordination. Pt demonstrating increased difficulty with catching weighted yellow ball due to L UE weakness. Mod A for squat pivot t/f tx mat>w/c. Pt req min A for w/c propulsion back to room w/ B LE, demonstrating galloping pattern. Pt left sitting in w/c w/ all needs in reach, quick release belt in place and sitter in room.   Therapy  Documentation Precautions:  Precautions Precautions: Fall Precaution Comments: abdominal binder over peg, R crani in abdomen Required Braces or Orthoses: Other Brace/Splint Cervical Brace: Other (comment) Other Brace/Splint: Cervical Collar D/C'ed Restrictions Weight Bearing Restrictions: No Other Position/Activity Restrictions: WBAT LLE General:   Vital Signs: Therapy Vitals Pulse Rate: 78 Pain: Pain Assessment Pain Assessment: No/denies pain Mobility:   Locomotion : Ambulation Ambulation/Gait Assistance: 1: +2 Total assist  Trunk/Postural Assessment :    Balance:   Exercises:   Other Treatments:    See FIM for current functional status  Therapy/Group: Individual Therapy  Denzil Hughes 04/17/2014, 6:37 PM

## 2014-04-17 NOTE — Progress Notes (Signed)
Speech Language Pathology Daily Session Note  Patient Details  Name: Victor [name] MRN: [id number] Date of Birth: 10-29-1990  Today's Date: 04/17/2014 SLP Individual Time: 1500-1530 SLP Individual Time Calculation (min): 30 min  Short Term Goals: Week 4: SLP Short Term Goal 1 (Week 4): Patient will utilize swallowing compensatory strategies with Min A multimodal cueing with current diet to minimize overt s/s of aspiration. SLP Short Term Goal 2 (Week 4): Patient with orient to place and situation with Mod A multimodal cues. SLP Short Term Goal 3 (Week 4): Patient will verbalize 1 physical and 1 cognitive deficit with Mod A question cues. SLP Short Term Goal 4 (Week 4): Patient will attend to basic self-care tasks for 15 minutes with Supervision cues for redirections. SLP Short Term Goal 5 (Week 4): Patient will initate set-up with meals/snacks with Min A multimodal cues.  SLP Short Term Goal 6 (Week 4): Patient will consume trials of thin liquids with minimal overt s/s of aspiration with Min A multimodal cues to utilize swallowing compensatory strategy of small sips.  Skilled Therapeutic Interventions:  Pt was seen for skilled speech therapy targeting dysphagia goals.  Pt initiated set-up to complete oral care in a timely manner with overall supervision cuing.  SLP facilitated the session with trials of regular water with pt exhibiting no overt s/s of aspiration with min cues for use of compensatory swallowing strategies.  Pt required frequent max assist multimodal cues for use of environmental aids to reorient to place, date, and situation.  Pt remained pleasantly interactive throughout session with no verbal agitation noted.  Continue per current plan of care.    FIM:  Comprehension Comprehension Mode: Auditory Comprehension: 3-Understands basic 50 - 74% of the time/requires cueing 25 - 50%  of the time Expression Expression Mode: Verbal Expression: 3-Expresses basic 50 - 74% of the  time/requires cueing 25 - 50% of the time. Needs to repeat parts of sentences. Social Interaction Social Interaction: 2-Interacts appropriately 25 - 49% of time - Needs frequent redirection. Problem Solving Problem Solving: 2-Solves basic 25 - 49% of the time - needs direction more than half the time to initiate, plan or complete simple activities Memory Memory: 1-Recognizes or recalls less than 25% of the time/requires cueing greater than 75% of the time FIM - Eating Eating Activity: 5: Supervision/cues  Pain Pain Assessment Pain Assessment: No/denies pain  Therapy/Group: Individual Therapy  Jackalyn Lombard, M.A. CCC-SLP  Andromeda Poppen, [name] 04/17/2014, 4:30 PM

## 2014-04-17 NOTE — Progress Notes (Signed)
Nursing Note: On first round in to see pt and check environment. Pt was asleep.When pt awakened ,he was angry,agitated and raised up out of bed as if he might hit this nurse.Introduced myself again, as I was assigned to pt in the past and have never seen him agitated and angry.Stepped away and gave pt space and again explained that I was just checking in on him.Pt sat up again in the bed and stated " You don't have to be in my eyeballs".Sitter at the bedside attempted to calm pt ,but was urged to give pt his space and just leave him alone for now.Pt laid back in bed and looks as if he will go back to sleep.wbb

## 2014-04-17 NOTE — Progress Notes (Signed)
Physical Therapy Weekly Progress Note  Patient Details  Name: Victor [name] MRN: [id number] Date of Birth: 1990-08-10  Beginning of progress report period: April 11, 2014 End of progress report period: April 17, 2014  Today's Date: 04/17/2014 PT Individual Time: 0800-0900 PT Individual Time Calculation (min): 60 min   Patient has met 1 of 4 short term goals.  Patient has plateaued physically during this reporting period on rehab. Patient is able to perform functional transfers inconsistently with maxA of one helper, functional ambulation for 30-90' with +2 assist, and 5-6 stairs with B handrails and +2 assist. Patient continues to demonstrate significant truncal extension during movements, steppage/ataxic gait and stair negotiation, and limb apraxia (especially during stair negotiation). Currently, patient requires overall Total-Max A to utilize external aids for recall of orientation information and continues to demonstrate perseveration in regard to orientation to situation and place. Patient also requires Total-Max A for sustained attention, intellectual awareness, problem solving, working memory, and Surveyor, mining. Patient with minimal gains this reporting period and suspect his function is impacted by his participation/cooperation level and increased verbal agitation with functional tasks. Patient is demonstrating increased agitation due to poor frustration tolerance with tasks of increasing difficulty during therapy. Patient is more difficult to redirect than previously and has consistently missed therapy minutes due to need for de-escalation after agitation. Patient is currently demonstrating behaviors consistent with Rancho Level V.   Patient continues to demonstrate the following deficits: apraxia, L hemiplegia, decreased activity tolerance, poor balance and balance strategies in sitting and standing, decreased postural control during standing and transitional movements, decreased  funcitonal use of all 4 extremities due to L hemiplegia and limb apraxia, decreased sustained attention, decreased safety awareness and awareness of deficits, decreased coordination, and decreased knowledge of precautions and therefore will continue to benefit from skilled PT intervention to enhance overall performance with activity tolerance, balance, postural control, ability to compensate for deficits, functional use of left upper extremity and left lower extremity, attention, awareness, coordination and knowledge of precautions.  Patient not progressing toward long term goals.  See goal revision..  Plan of care revisions: all LTGs downgraded to minA overall.  PT Short Term Goals Week 1:  PT Short Term Goal 1 (Week 1): Patient will perform bed mobility with minA. PT Short Term Goal 1 - Progress (Week 1): Met PT Short Term Goal 2 (Week 1): Patient will perform functional transfers with maxA x1. PT Short Term Goal 2 - Progress (Week 1): Met PT Short Term Goal 3 (Week 1): Patient will ambulate 64' with +2 assist. PT Short Term Goal 3 - Progress (Week 1): Met PT Short Term Goal 4 (Week 1): Patient will negotiate 2 steps with B handrails and +2 assist. PT Short Term Goal 4 - Progress (Week 1): Met Week 2:  PT Short Term Goal 1 (Week 2): Patient will perform bed mobility with supervision on flat bed. PT Short Term Goal 1 - Progress (Week 2): Met PT Short Term Goal 2 (Week 2): Patient will perform functional transfers with modA x1. PT Short Term Goal 2 - Progress (Week 2): Partly met (meets inconsistently) PT Short Term Goal 3 (Week 2): Patient will perform gait training 49' x1 with maxA x1. PT Short Term Goal 3 - Progress (Week 2): Progressing toward goal PT Short Term Goal 4 (Week 2): Patient will negotiate 5 steps with B handrails and maxA x1. PT Short Term Goal 4 - Progress (Week 2): Progressing toward goal Week 3:  PT Short Term  Goal 1 (Week 3): Patient will perform bed mobility with  supervision on flat bed. PT Short Term Goal 1 - Progress (Week 3): Met PT Short Term Goal 2 (Week 3): Patient will perform functional transfers with modA x1. PT Short Term Goal 2 - Progress (Week 3): Progressing toward goal (has met inconsistently) PT Short Term Goal 3 (Week 3): Patient will perform gait training 59'  x1 with maxA x1. PT Short Term Goal 3 - Progress (Week 3): Progressing toward goal (requires +2 for safety secondary to impulsivity and bouts of agitation) PT Short Term Goal 4 (Week 3): Patient will negotiate 5 steps with B handrails and maxA x1. PT Short Term Goal 4 - Progress (Week 3): Progressing toward goal Week 4:  PT Short Term Goal 1 (Week 4): Patient will perform functional transfers with modA x1. PT Short Term Goal 2 (Week 4): Patient will perform gait training 30' x1 with maxA. PT Short Term Goal 3 (Week 4): Patient will negotiate 3 steps with B handrails and maxA x1  Skilled Therapeutic Interventions/Progress Updates:    Patient received sitting in wheelchair. Session focused on initiation, sustained attention, and cognitive remediation with functional mobility tasks. Patient's uncle present for session, serving as a distraction for patient during tasks. Attempted to redirect uncle to assist with therapy session in more therapeutic manner, however, unable to achieve this. Wheelchair mobility 200' x1 and 100' x1 with modA, use of B UE/LE intermittently. Patient performed functional ambulation with B HHA and modA of each helper 100'x1, 125' x2. Patient demonstrates more normalized swing phase on L LE, continues with steppage gait pattern on R LE, likely a compensation due to decreased DF of R foot. Patient continues to ambulate with significant trunk extension, which contributes to need for increased assistance. Stair negotiation x3 steps with B handrails and maxA, ascends forwards, descends backwards secondary to poor motor planning when attempting to descend forwards.    NuStep Level 7 x10' for improved reciprocal pattern and sustained attention to task. Sitting EOM, UE/trunk exercises: bicep curls (5# R UE, 3# L UE), chest press with 5# dowel, overhead press 2# dowel, trunk rotation with shoulder flex and elbow ext with 2# dowel. Patient returned to room and left sitting in wheelchair with seatbelt donned and uncle and sitter present.  Therapy Documentation Precautions:  Precautions Precautions: Fall Precaution Comments: abdominal binder over peg, R crani in abdomen Required Braces or Orthoses: Other Brace/Splint Cervical Brace: Other (comment) Other Brace/Splint: Cervical Collar D/C'ed Restrictions Weight Bearing Restrictions: No Other Position/Activity Restrictions: WBAT LLE Pain: Pain Assessment Pain Assessment: No/denies pain Pain Score: 0-No pain Locomotion : Ambulation Ambulation/Gait Assistance: 1: +2 Total assist Wheelchair Mobility Distance: 200   See FIM for current functional status  Therapy/Group: Individual Therapy  Lillia Abed. Johnchristopher Sarvis, PT, DPT 04/17/2014, 9:15 AM

## 2014-04-17 NOTE — Progress Notes (Signed)
Nursing Note: Pt has denied thoughts of harming self.wbb

## 2014-04-17 NOTE — Progress Notes (Signed)
The skilled treatment note has been reviewed and SLP is in agreement.  Sarita Hakanson, M.A., CCC-SLP  [phone]   
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
## 2014-04-17 NOTE — Plan of Care (Signed)
Problem: RH BLADDER ELIMINATION Goal: RH STG MANAGE BLADDER WITH ASSISTANCE STG Manage Bladder With total Assistance  Outcome: Not Progressing Incont. Wears condom cath. @ night

## 2014-04-17 NOTE — Progress Notes (Signed)
Speech Language Pathology Weekly Progress and Session Note  Patient Details  Name: Victor [name] MRN: [id number] Date of Birth: 11-12-1990  Beginning of progress report period: April 10, 2014 End of progress report period: April 17, 2014  Today's Date: 04/17/2014 SLP Individual Time: 0900-1000 SLP Individual Time Calculation (min): 60 min  Short Term Goals: Week 3: SLP Short Term Goal 1 (Week 3): Patient will utilize swallowing compensatory strategies with Mod A multimodal cueing with current diet to minimize overt s/s of aspiration. SLP Short Term Goal 1 - Progress (Week 3): Met SLP Short Term Goal 2 (Week 3): Patient with orient to place and situation with Max multimodal cues. SLP Short Term Goal 2 - Progress (Week 3): Met SLP Short Term Goal 3 (Week 3): Patient will verbalize 1 physical and 1 cognitive deficit with Max A question cues. SLP Short Term Goal 3 - Progress (Week 3): Met SLP Short Term Goal 4 (Week 3): Patient will attend to basic self-care tasks for 15 minutes with Min cues for redirections. SLP Short Term Goal 4 - Progress (Week 3): Met SLP Short Term Goal 5 (Week 3): Patient will initate set-up with meals/snacks with Mod  Amultimodal cues.  SLP Short Term Goal 5 - Progress (Week 3): Met    New Short Term Goals: Week 4: SLP Short Term Goal 1 (Week 4): Patient will utilize swallowing compensatory strategies with Min A multimodal cueing with current diet to minimize overt s/s of aspiration. SLP Short Term Goal 2 (Week 4): Patient with orient to place and situation with Mod A multimodal cues. SLP Short Term Goal 3 (Week 4): Patient will verbalize 1 physical and 1 cognitive deficit with Mod A question cues. SLP Short Term Goal 4 (Week 4): Patient will attend to basic self-care tasks for 15 minutes with Supervision cues for redirections. SLP Short Term Goal 5 (Week 4): Patient will initate set-up with meals/snacks with Min A multimodal cues.  SLP Short Term Goal 6 (Week  4): Patient will consume trials of thin liquids with minimal overt s/s of aspiration with Min A multimodal cues to utilize swallowing compensatory strategy of small sips.  Weekly Progress Updates: Patient has made functional gains and has met 5 of 5 STG's this reporting period due to increased sustained attention, orientation, initiation, and swallowing function. Currently, patient demonstrates behaviors consistent with a Rancho Level V emerging VI and requires Total A for problem solving, working Herbalist, Max A for orientation and Mod A for initiation and attention to functional task. Patient continues to demonstrate language of confusion with verbal preservation and intermittent agitation and can be difficult to redirect at times. Patient is currently consuming Dys. 1 textures with nectar-thick liquids without overt s/s of aspiration and overall Mod A multimodal cues for swallowing compensatory strategies of small bites and sips. Patient consumed trials of thin liquids and demonstrated what appeared to be a timely swallow initiation without overt s/s of aspiration but utilized multiple swallows with larger sips, therefore, SLP initiated water protocol on 04/17/14 with recommendation of a repeat MBS to assess swallow function with possible upgrade. Patient/family education ongoing and patient would benefit from continued skilled SLP intervention to maximize his cognitive-linguistic and swallowing function and overall functional independence prior to discharge to SNF.   Intensity: Minumum of 1-2 x/day, 30 to 90 minutes Frequency: 5 out of 7 days Duration/Length of Stay: TBD due to SNF placement Treatment/Interventions: Cognitive remediation/compensation;Cueing hierarchy;Dysphagia/aspiration precaution training;Environmental controls;Functional tasks;Internal/external aids;Patient/family education;Therapeutic Activities   Daily  Session Skilled Therapeutic Interventions:  Skilled  treatment session focused on dysphagia and cognitive-linguistic goals. Student facilitated session by providing Mod A multimodal cues for utilization of swallowing compensatory strategies of small bites and sips. Patient consumed trials of thin liquids and demonstrated what appeared to be a timely swallow initiation without overt s/s of aspiration but utilized multiple swallows with larger sips, therefore, SLP initiated water protocol on 04/17/14 with recommendation of a repeat MBS to assess swallow function with possible upgrade. Patient was independently oriented to place but needed Mod-Max A for orientation to age, date, and situation. Patient also demonstrated verbal preservation with intermittent language of confusion but was easily redirected. Patient was left in room with call bell within reach. Continue with current plan of care.        FIM:  Comprehension Comprehension Mode: Auditory Comprehension: 3-Understands basic 50 - 74% of the time/requires cueing 25 - 50%  of the time Expression Expression Mode: Verbal Expression: 3-Expresses basic 50 - 74% of the time/requires cueing 25 - 50% of the time. Needs to repeat parts of sentences. Social Interaction Social Interaction: 2-Interacts appropriately 25 - 49% of time - Needs frequent redirection. Problem Solving Problem Solving: 2-Solves basic 25 - 49% of the time - needs direction more than half the time to initiate, plan or complete simple activities Memory Memory: 1-Recognizes or recalls less than 25% of the time/requires cueing greater than 75% of the time FIM - Eating Eating Activity: 5: Supervision/cues  Pain Pain Assessment Pain Assessment: No/denies pain Pain Score: 0-No pain  Therapy/Group: Individual Therapy  Khayree Delellis 04/17/2014, 11:32 AM

## 2014-04-17 NOTE — Progress Notes (Signed)
Occupational Therapy Session Note  Patient Details  Name: Victor [name] MRN: [id number] Date of Birth: 02-22-1991  Today's Date: 04/17/2014 OT Individual Time: 1030-1130 OT Individual Time Calculation (min): 60 min    Short Term Goals: Week 3:  OT Short Term Goal 1 (Week 3): Patient will complete toilet transfer with mod assist OT Short Term Goal 2 (Week 3): Patient will consistently be oriented x3 with min cues OT Short Term Goal 3 (Week 3): Patient will demonstrate sustained attention to task for 5 min with min cues OT Short Term Goal 4 (Week 3): Patient will complete 2 grooming tasks in standing with min assist for balance  Skilled Therapeutic Interventions/Progress Updates:    Pt seated in w/c with sitter present upon arrival.  Pt consistently declined bathing and dressing this morning and quickly became agitated.  Pt required max verbal cues for redirection but finally agreed to going to therapy gym for activities.  Pt propelled w/c approx 50' with min A and max verbal cues to prevent running into wall.  Pt initially engaged in 2 games of War (card game).  Pt initially stated he didn't know how to play but once engaged in game pt stated he knew how to play game.  Pt required mod verbal cues to determine which card was higher in value.  Pt transitioned to playing Connect 4.  Pt required max verbal cues to determine winner of 3 games.  Pt perseverated on meal times, going to group home, and meeting young ladies.  Pt easily redirected.  Focus on cognitive remediation, w/c mobility, orientation, task initiation, sequencing, attention to task, and safety awareness.  Therapy Documentation Precautions:  Precautions Precautions: Fall Precaution Comments: abdominal binder over peg, R crani in abdomen Required Braces or Orthoses: Other Brace/Splint Cervical Brace: Other (comment) Other Brace/Splint: Cervical Collar D/C'ed Restrictions Weight Bearing Restrictions: No Other Position/Activity  Restrictions: WBAT LLE Pain: Pain Assessment Pain Assessment: No/denies pain Pain Score: 0-No pain  See FIM for current functional status  Therapy/Group: Individual Therapy  [name] 04/17/2014, 11:38 AM

## 2014-04-17 NOTE — Progress Notes (Signed)
Nursing Note: Pt asleep.Meds given via peg as pt was quite agitated earlier.Pt resting quietly,asleep.Sitter at the bedside.wbb

## 2014-04-17 NOTE — Plan of Care (Signed)
Problem: RH BOWEL ELIMINATION Goal: RH STG MANAGE BOWEL WITH ASSISTANCE STG Manage Bowel with total Assistance.  Outcome: Not Progressing Incont. Depends on staff for hygiene.

## 2014-04-18 ENCOUNTER — Inpatient Hospital Stay (HOSPITAL_COMMUNITY): Payer: Medicaid Other | Admitting: Occupational Therapy

## 2014-04-18 ENCOUNTER — Inpatient Hospital Stay (HOSPITAL_COMMUNITY): Payer: Medicaid Other | Admitting: Physical Therapy

## 2014-04-18 ENCOUNTER — Inpatient Hospital Stay (HOSPITAL_COMMUNITY): Payer: Medicaid Other | Admitting: Speech Pathology

## 2014-04-18 ENCOUNTER — Inpatient Hospital Stay (HOSPITAL_COMMUNITY): Payer: Self-pay

## 2014-04-18 ENCOUNTER — Encounter (HOSPITAL_COMMUNITY): Payer: Self-pay

## 2014-04-18 DIAGNOSIS — S069X9A Unspecified intracranial injury with loss of consciousness of unspecified duration, initial encounter: Secondary | ICD-10-CM

## 2014-04-18 DIAGNOSIS — S069XAA Unspecified intracranial injury with loss of consciousness status unknown, initial encounter: Secondary | ICD-10-CM

## 2014-04-18 DIAGNOSIS — W19XXXA Unspecified fall, initial encounter: Secondary | ICD-10-CM

## 2014-04-18 NOTE — Progress Notes (Signed)
Speech Language Pathology Daily Session Note  Patient Details  Name: Victor [name] MRN: [id number] Date of Birth: [birth year]  Today's Date: 04/18/2014 SLP Individual Time: 0930-1000 SLP Individual Time Calculation (min): 30 min  Short Term Goals: Week 4: SLP Short Term Goal 1 (Week 4): Patient will utilize swallowing compensatory strategies with Min A multimodal cueing with current diet to minimize overt s/s of aspiration. SLP Short Term Goal 2 (Week 4): Patient with orient to place and situation with Mod A multimodal cues. SLP Short Term Goal 3 (Week 4): Patient will verbalize 1 physical and 1 cognitive deficit with Mod A question cues. SLP Short Term Goal 4 (Week 4): Patient will attend to basic self-care tasks for 15 minutes with Supervision cues for redirections. SLP Short Term Goal 5 (Week 4): Patient will initate set-up with meals/snacks with Min A multimodal cues.  SLP Short Term Goal 6 (Week 4): Patient will consume trials of thin liquids with minimal overt s/s of aspiration with Min A multimodal cues to utilize swallowing compensatory strategy of small sips.  Skilled Therapeutic Interventions: Skilled treatment session focused on dysphagia goals. SLP facilitated session by providing set-up of suction toothbrush for oral care, however, patient demonstrated appropriate attention and thoroughness with task with supervision verbal cues. Patient consumed thin liquids without overt s/s of aspiration but utilized multiple swallows with large sips via cup despite mod verbal cues for use of compensatory swallowing strategy of small sips. Pt was independently oriented to person, place and situation and required Mod multimodal cues for orientation to time.  Patient demonstrated verbal perseveration but remained pleasantly interactive throughout session with no verbal agitation noted. Continue per current plan of care.    FIM:  Comprehension Comprehension Mode: Auditory Comprehension: 3-Understands  basic 50 - 74% of the time/requires cueing 25 - 50%  of the time Expression Expression Mode: Verbal Expression: 3-Expresses basic 50 - 74% of the time/requires cueing 25 - 50% of the time. Needs to repeat parts of sentences. Social Interaction Social Interaction: 2-Interacts appropriately 25 - 49% of time - Needs frequent redirection. Problem Solving Problem Solving: 2-Solves basic 25 - 49% of the time - needs direction more than half the time to initiate, plan or complete simple activities Memory Memory: 2-Recognizes or recalls 25 - 49% of the time/requires cueing 51 - 75% of the time FIM - Eating Eating Activity: 5: Supervision/cues  Pain Pain Assessment Pain Assessment: No/denies pain  Therapy/Group: Individual Therapy  Keddrick Wyne 04/18/2014, 12:20 PM

## 2014-04-18 NOTE — Progress Notes (Signed)
Occupational Therapy Weekly Progress Note  Patient Details  Name: Victor [name] MRN: [id number] Date of Birth: 1991-05-25  Beginning of progress report period: April 09, 2014 End of progress report period: April 18, 2014  Patient has met 1 of 4 short term goals.  Pt progress has been slow and inconsistent this past week.  Pt continues to consistently require max A for transfers secondary to significant posterior lean during transfer. Pt is oriented X 3 approx 25% of time with min cues and typically requires max verbal cues for orientation.  Pt requires mod A for standing balance at sink and in shower.  Pt requires min/mod verbal cues in shower for thoroughness and sequencing.  Patient continues to demonstrate the following deficits: Impaired dynamic sitting balance, impaired static standing balance, impaired orientation and memory, and LUE weakness and therefore will continue to benefit from skilled OT intervention to enhance overall performance with BADL.  Patient progressing toward long term goals..  Continue plan of care.  OT Short Term Goals Week 3:  OT Short Term Goal 1 (Week 3): Patient will complete toilet transfer with mod assist OT Short Term Goal 1 - Progress (Week 3): Progressing toward goal OT Short Term Goal 2 (Week 3): Patient will consistently be oriented x3 with min cues OT Short Term Goal 2 - Progress (Week 3): Progressing toward goal OT Short Term Goal 3 (Week 3): Patient will demonstrate sustained attention to task for 5 min with min cues OT Short Term Goal 3 - Progress (Week 3): Met OT Short Term Goal 4 (Week 3): Patient will complete 2 grooming tasks in standing with min assist for balance OT Short Term Goal 4 - Progress (Week 3): Progressing toward goal Week 4:  OT Short Term Goal 1 (Week 4): Patient will complete 2 grooming tasks in standing with min assist for balance OT Short Term Goal 2 (Week 4): Pt will consistently be oriented X 3 with min verbal cues OT Short  Term Goal 3 (Week 4): Pt will complete toilet transfer with mod A OT Short Term Goal 4 (Week 4): Pt will perfom shower transfer with mod A  Therapy Documentation Precautions:  Precautions Precautions: Fall Precaution Comments: abdominal binder over peg, R crani in abdomen Required Braces or Orthoses: Other Brace/Splint Cervical Brace: Other (comment) Other Brace/Splint: Cervical Collar D/C'ed Restrictions Weight Bearing Restrictions: No Other Position/Activity Restrictions: WBAT LLE    See FIM for current functional status     Leroy Libman 04/18/2014, 2:22 PM

## 2014-04-18 NOTE — Progress Notes (Signed)
Physical Therapy Session Note  Patient Details  Name: Victor [name] MRN: [id number] Date of Birth: [birth year]  Today's Date: 04/18/2014 PT Individual Time: 1400-1500 PT Individual Time Calculation (min): 60 min   Short Term Goals: Week 4:  PT Short Term Goal 1 (Week 4): Patient will perform functional transfers with modA x1. PT Short Term Goal 2 (Week 4): Patient will perform gait training 30' x1 with maxA. PT Short Term Goal 3 (Week 4): Patient will negotiate 3 steps with B handrails and maxA x1  Skilled Therapeutic Interventions/Progress Updates:    Therapeutic Activity: PT instructs pt in stand-pivot transfer w/c to/from bed req mod A for balance - pt very retropulsive.  PT instructs pt in supine to/from sit in bed without rails req SBA.  Pt was incontinent of bowel and urine and req assist changing diaper, but pt is able to assist in peri hygiene anterior and posterior with set-up, but pt still req PT to ensure pt is fully clean. Pt rolls L in bed with Supervision, but req mod A to roll R in bed.   W/C Management: PT instructs pt in w/c propulsion with B UE/LE prn req min A due to L UE weakness and w/c veering to the L towards wall x 200'. Pt demonstrates ability to lock/unlock brakes req verbal cues.   Neuromuscular Reeducation: PT instructs pt in standing balance with L arm on wall rail req min A for balance to sit to stand and min-mod A static standing balance with L wall rail support and R hand hold support x 3 reps.  PT instructs pt in cognitive remediation in sit, playing a game of checkers. PT instructs pt in the rules of the game, but pt continues to req max A to follow the rules of the game. PT instructs pt to use L UE and he only does so when PT cues him to do so and only occasionally, when he feels like it. Pt req Supervision with reaching tasks in sit for the game of checkers.   Pt is progressing in function, is very strong and retropulsive in sit to stands and in stand-pivot  transfers. Pt's behavior is improved, but continues to be inappropriate at times. Pt called this PT a "dummy" and when PT explained to pt that it was inappropriate, pt only repeated "dummy" more frequently. PT then stopped the game they were playing and pt apologized to PT. Continue per PT POC.   Therapy Documentation Precautions:  Precautions Precautions: Fall Precaution Comments: abdominal binder over peg, R crani in abdomen Required Braces or Orthoses: Other Brace/Splint Cervical Brace: Other (comment) Other Brace/Splint: Cervical Collar D/C'ed Restrictions Weight Bearing Restrictions: No Other Position/Activity Restrictions: WBAT LLE Pain: Pain Assessment Pain Assessment: No/denies pain   See FIM for current functional status  Therapy/Group: Individual Therapy  Kandance Yano M 04/18/2014, 4:22 PM

## 2014-04-18 NOTE — Progress Notes (Signed)
Physical Therapy Session Note  Patient Details  Name: Victor [name] MRN: [id number] Date of Birth: 11-Aug-1990  Today's Date: 04/18/2014 PT Individual Time: 0800-0900 PT Individual Time Calculation (min): 60 min   Short Term Goals: Week 4:  PT Short Term Goal 1 (Week 4): Patient will perform functional transfers with modA x1. PT Short Term Goal 2 (Week 4): Patient will perform gait training 30' x1 with maxA. PT Short Term Goal 3 (Week 4): Patient will negotiate 3 steps with B handrails and maxA x1  Skilled Therapeutic Interventions/Progress Updates:  1:1. Pt received semi-reclined in bed, ready for therapy with sitter in room. Focus this session on cognitive remediation, functional mobility, balance and therex. Pt found to be incontinent of bowel, total A for clean up but supervision-min A for B rolling. Pt req supervision for t/f sup>sit EOB and min A for lateral scoot/squat pivot t/fs bed<>w/c<>tx mat and w/c<>NuStep w/ manual facilitation for anterior lean due to trunk extension. Pt req min A for w/c propulsion 180'x2 w/ B LE, cues for awareness to as well as negotiation of obstacles. Emphasis on standing balance, even WB and facilitation of anterior lean through reaching of horseshoes basketball hoop<>low bench. Pt req mod R HHA during task to encourage use of L UE. Pt with good tolerance to ambulation 250'x1 w/ B HHA x2persons, therapist facilitating pelvic flexion to prevent trunk extension. Pt continues to demonstrate steppage pattern with R LE, however, gradually improving. Pt utilized NuStep for B UE/LE strength/endurance, level 5x45min w/ good tolerance. Pt left sitting in w/c at end of session w/ all needs in reach, quick release belt in place and sitter in room.   Therapy Documentation Precautions:  Precautions Precautions: Fall Precaution Comments: abdominal binder over peg, R crani in abdomen Required Braces or Orthoses: Other Brace/Splint Cervical Brace: Other (comment) Other  Brace/Splint: Cervical Collar D/C'ed Restrictions Weight Bearing Restrictions: No Other Position/Activity Restrictions: WBAT LLE Pain: Pain Assessment Pain Assessment: No/denies pain  See FIM for current functional status  Therapy/Group: Individual Therapy  Denzil Hughes 04/18/2014, 12:45 PM
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
## 2014-04-18 NOTE — Progress Notes (Signed)
Occupational Therapy Session Note  Patient Details  Name: Victor [name] MRN: [id number] Date of Birth: [birth year]  Today's Date: 04/18/2014 OT Individual Time: 1100-1200 OT Individual Time Calculation (min): 60 min    Short Term Goals: Week 3:  OT Short Term Goal 1 (Week 3): Patient will complete toilet transfer with mod assist OT Short Term Goal 2 (Week 3): Patient will consistently be oriented x3 with min cues OT Short Term Goal 3 (Week 3): Patient will demonstrate sustained attention to task for 5 min with min cues OT Short Term Goal 4 (Week 3): Patient will complete 2 grooming tasks in standing with min assist for balance  Skilled Therapeutic Interventions/Progress Updates:    Pt engaged in BADL retraining including bathing at shower level and dressing with sit<>stand from w/c.  Pt exhibited increased impulsivity this morning and required max verbal cues for safety awareness with all transitional movements.  Pt completed bathing tasks with min verbal cues for thoroughness.  Pt completed dressing tasks when presented with clothing items.  Pt transitioned to therapy gym and engaged in Connect 4 requiring max verbal cues to determine winner of each game.  Pt required tot A for orientation X 3 this morning.  Focus on cognitive remediation, activity tolerance, transitional movements, safety awareness, task initiation, attention to task, and standing balance. Therapy Documentation Precautions:  Precautions Precautions: Fall Precaution Comments: abdominal binder over peg, R crani in abdomen Required Braces or Orthoses: Other Brace/Splint Cervical Brace: Other (comment) Other Brace/Splint: Cervical Collar D/C'ed Restrictions Weight Bearing Restrictions: No Other Position/Activity Restrictions: WBAT LLE   Pain: Pain Assessment Pain Assessment: No/denies pain  See FIM for current functional status  Therapy/Group: Individual Therapy  [name] 04/18/2014, 12:12 PM

## 2014-04-18 NOTE — Progress Notes (Signed)
Occupational Therapy Session Note  Patient Details  Name: Victor [name] MRN: [id number] Date of Birth: 13-May-1991  Today's Date: 04/18/2014 OT Individual Time: 1255-1355 OT Individual Time Calculation (min): 60 min    Short Term Goals:  Week 4:  OT Short Term Goal 1 (Week 4): Patient will complete 2 grooming tasks in standing with min assist for balance OT Short Term Goal 2 (Week 4): Pt will consistently be oriented X 3 with min verbal cues OT Short Term Goal 3 (Week 4): Pt will complete toilet transfer with mod A OT Short Term Goal 4 (Week 4): Pt will perfom shower transfer with mod A  Skilled Therapeutic Interventions/Progress Updates:  Upon entering the room, pt seated in wheelchair with sitter present. Pt with no c/o,signs, or symptoms of pain this session. Pt perseverating on going to group home, shopping, and meal times this session and requiring Min - Max verbal cues for redirection. Pt wearing dirty hospital gown from lunch. Pt snapped new gown sleeves in place with increased time and min verbal cues for technique to be successful. Pt changed gown and washed hands seated in wheelchair at sink side. Pt propelled wheelchair with B UEs ~ 60 feet into day room with assistance for straight line as well as turns. Pt reporting, "I love connect 4." Pt able to explain rules of connect 4 with verbal questioning cues. Pt verbalizing each time who's turn it was during game. Pt only able to determine winner/ end of game in 2/4 trials this session. Pt engaged in shuffling of cards as well as flipping cards over on table for [facility] activity with L and R hands. Pt oriented to self and location only with min verbal cues this session. Pt returned to room and seated in wheelchair with sitter present in room upon therapist exiting.   Therapy Documentation Precautions:  Precautions Precautions: Fall Precaution Comments: abdominal binder over peg, R crani in abdomen Required Braces or Orthoses: Other  Brace/Splint Cervical Brace: Other (comment) Other Brace/Splint: Cervical Collar D/C'ed Restrictions Weight Bearing Restrictions: No Other Position/Activity Restrictions: WBAT LLE   See FIM for current functional status  Therapy/Group: Individual Therapy  Lowella Grip 04/18/2014, 4:12 PM

## 2014-04-19 ENCOUNTER — Inpatient Hospital Stay (HOSPITAL_COMMUNITY): Payer: Self-pay | Admitting: Physical Therapy
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
## 2014-04-19 NOTE — Progress Notes (Signed)
Physical Therapy Session Note  Patient Details  Name: Victor [name] MRN: [id number] Date of Birth: [birth year]  Today's Date: 04/19/2014 PT Individual Time: 1330-1415 PT Individual Time Calculation (min): 45 min   Short Term Goals: Week 1:  PT Short Term Goal 1 (Week 1): Patient will perform bed mobility with minA. PT Short Term Goal 1 - Progress (Week 1): Met PT Short Term Goal 2 (Week 1): Patient will perform functional transfers with maxA x1. PT Short Term Goal 2 - Progress (Week 1): Met PT Short Term Goal 3 (Week 1): Patient will ambulate 74' with +2 assist. PT Short Term Goal 3 - Progress (Week 1): Met PT Short Term Goal 4 (Week 1): Patient will negotiate 2 steps with B handrails and +2 assist. PT Short Term Goal 4 - Progress (Week 1): Met Week 2:  PT Short Term Goal 1 (Week 2): Patient will perform bed mobility with supervision on flat bed. PT Short Term Goal 1 - Progress (Week 2): Met PT Short Term Goal 2 (Week 2): Patient will perform functional transfers with modA x1. PT Short Term Goal 2 - Progress (Week 2): Partly met (meets inconsistently) PT Short Term Goal 3 (Week 2): Patient will perform gait training 44' x1 with maxA x1. PT Short Term Goal 3 - Progress (Week 2): Progressing toward goal PT Short Term Goal 4 (Week 2): Patient will negotiate 5 steps with B handrails and maxA x1. PT Short Term Goal 4 - Progress (Week 2): Progressing toward goal Week 3:  PT Short Term Goal 1 (Week 3): Patient will perform bed mobility with supervision on flat bed. PT Short Term Goal 1 - Progress (Week 3): Met PT Short Term Goal 2 (Week 3): Patient will perform functional transfers with modA x1. PT Short Term Goal 2 - Progress (Week 3): Progressing toward goal (has met inconsistently) PT Short Term Goal 3 (Week 3): Patient will perform gait training 33'  x1 with maxA x1. PT Short Term Goal 3 - Progress (Week 3): Progressing toward goal (requires +2 for safety secondary to impulsivity and bouts of  agitation) PT Short Term Goal 4 (Week 3): Patient will negotiate 5 steps with B handrails and maxA x1. PT Short Term Goal 4 - Progress (Week 3): Progressing toward goal Week 4:  PT Short Term Goal 1 (Week 4): Patient will perform functional transfers with modA x1. PT Short Term Goal 2 (Week 4): Patient will perform gait training 30' x1 with maxA. PT Short Term Goal 3 (Week 4): Patient will negotiate 3 steps with B handrails and maxA x1  Skilled Therapeutic Interventions/Progress Updates:    Pt with difficulty attending during session needing frequent behavioral management to stay on task throughout. Decreased safety awareness and insight throughout with limited response to education. Pt would continue to benefit from skilled PT services to increase functional mobility.  Therapy Documentation Precautions:  Precautions Precautions: Fall Precaution Comments: abdominal binder over peg, R crani in abdomen Required Braces or Orthoses: Other Brace/Splint Cervical Brace: Other (comment) Other Brace/Splint: Cervical Collar D/C'ed Restrictions Weight Bearing Restrictions: No Other Position/Activity Restrictions: WBAT LLE General:   Vital Signs: Therapy Vitals Temp: 98.1 F (36.7 C) Temp src: Oral Pulse Rate: 67 Resp: 20 BP: 124/72 mmHg Patient Position (if appropriate): Sitting Oxygen Therapy SpO2: 96 % O2 Device: None (Room air) Mobility:  Mod A for transfers with cues for safety, sequencing and technique Locomotion : Wheelchair Mobility Distance: 200  Other Treatments:   Pt performs  Transfer training 2x5  in session. Pt managed with frequent rest breaks, redirection to tasks, and decreased demands. Pt performs static standing 2'x3 with dual cog tasks. Pt oriented actively. Weight shifts in standing x5 with and without AD. Pt performs sitting and bed mobility dual motor task activities within functional tasks. See FIM for current functional status  Therapy/Group: Individual  Therapy  Monia Pouch 04/19/2014, 2:44 PM

## 2014-04-20 ENCOUNTER — Inpatient Hospital Stay (HOSPITAL_COMMUNITY): Payer: Self-pay | Admitting: Physical Therapy

## 2014-04-20 NOTE — Progress Notes (Signed)
Physical Therapy Session Note  Patient Details  Name: Victor [name] MRN: [id number] Date of Birth: [birth year]  Today's Date: 04/20/2014 PT Individual Time: 1300-1400 PT Individual Time Calculation (min): 60 min   Short Term Goals: Week 1:  PT Short Term Goal 1 (Week 1): Patient will perform bed mobility with minA. PT Short Term Goal 1 - Progress (Week 1): Met PT Short Term Goal 2 (Week 1): Patient will perform functional transfers with maxA x1. PT Short Term Goal 2 - Progress (Week 1): Met PT Short Term Goal 3 (Week 1): Patient will ambulate 37' with +2 assist. PT Short Term Goal 3 - Progress (Week 1): Met PT Short Term Goal 4 (Week 1): Patient will negotiate 2 steps with B handrails and +2 assist. PT Short Term Goal 4 - Progress (Week 1): Met Week 2:  PT Short Term Goal 1 (Week 2): Patient will perform bed mobility with supervision on flat bed. PT Short Term Goal 1 - Progress (Week 2): Met PT Short Term Goal 2 (Week 2): Patient will perform functional transfers with modA x1. PT Short Term Goal 2 - Progress (Week 2): Partly met (meets inconsistently) PT Short Term Goal 3 (Week 2): Patient will perform gait training 74' x1 with maxA x1. PT Short Term Goal 3 - Progress (Week 2): Progressing toward goal PT Short Term Goal 4 (Week 2): Patient will negotiate 5 steps with B handrails and maxA x1. PT Short Term Goal 4 - Progress (Week 2): Progressing toward goal Week 3:  PT Short Term Goal 1 (Week 3): Patient will perform bed mobility with supervision on flat bed. PT Short Term Goal 1 - Progress (Week 3): Met PT Short Term Goal 2 (Week 3): Patient will perform functional transfers with modA x1. PT Short Term Goal 2 - Progress (Week 3): Progressing toward goal (has met inconsistently) PT Short Term Goal 3 (Week 3): Patient will perform gait training 91'  x1 with maxA x1. PT Short Term Goal 3 - Progress (Week 3): Progressing toward goal (requires +2 for safety secondary to impulsivity and bouts of  agitation) PT Short Term Goal 4 (Week 3): Patient will negotiate 5 steps with B handrails and maxA x1. PT Short Term Goal 4 - Progress (Week 3): Progressing toward goal Week 4:  PT Short Term Goal 1 (Week 4): Patient will perform functional transfers with modA x1. PT Short Term Goal 2 (Week 4): Patient will perform gait training 30' x1 with maxA. PT Short Term Goal 3 (Week 4): Patient will negotiate 3 steps with B handrails and maxA x1  Skilled Therapeutic Interventions/Progress Updates:    Pt cooperative and pleasant to begin and end session, but pt largely limited in session by episode of agitation cursing at therapist and talking about hitting therapist. Pt managed with behavioral management techniques as described below, but pt requires ~40 min to be amenable to structured tasks. Pt would continue to benefit from skilled PT services to increase functional mobility.  Therapy Documentation Precautions:  Precautions Precautions: Fall Precaution Comments: abdominal binder over peg, R crani in abdomen, agitation at times Required Braces or Orthoses: Other Brace/Splint Cervical Brace: Other (comment) Other Brace/Splint: Cervical Collar D/C'ed Restrictions Weight Bearing Restrictions: No Other Position/Activity Restrictions: WBAT LLE Pain:  Pt denies Mobility:  Pt Mod A for transfers with cues for weight shift and technique Other Treatments:  Pt with episode of agitation in session requiring decreased stim, redirection to safe aerobic activities and football on television, decreased demands, and therapist  providing pt with ample space. Pt educated on anticipated disposition, rehab plan, and discharge process. Pt performs static standing balance 1'x2. Pt performs pre-gait 2x5 in standing with RUE support. Pt performs bed mobility and dynamic sitting balance with dual motor activities within functional context.  See FIM for current functional status  Therapy/Group: Individual  Therapy  Monia Pouch 04/20/2014, 3:58 PM
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
## 2014-04-21 ENCOUNTER — Inpatient Hospital Stay (HOSPITAL_COMMUNITY): Payer: Medicaid Other | Admitting: Speech Pathology

## 2014-04-21 ENCOUNTER — Inpatient Hospital Stay (HOSPITAL_COMMUNITY): Payer: Self-pay | Admitting: Speech Pathology

## 2014-04-21 ENCOUNTER — Inpatient Hospital Stay (HOSPITAL_COMMUNITY): Payer: Medicaid Other

## 2014-04-21 ENCOUNTER — Encounter (HOSPITAL_COMMUNITY): Payer: Self-pay | Admitting: *Deleted

## 2014-04-21 NOTE — Progress Notes (Signed)
Occupational Therapy Session Note  Patient Details  Name: Victor [name] MRN: [id number] Date of Birth: 28-Nov-1990  Today's Date: 04/21/2014 OT Individual Time: 1100-1200 OT Individual Time Calculation (min): 60 min    Short Term Goals: Week 4:  OT Short Term Goal 1 (Week 4): Patient will complete 2 grooming tasks in standing with min assist for balance OT Short Term Goal 2 (Week 4): Pt will consistently be oriented X 3 with min verbal cues OT Short Term Goal 3 (Week 4): Pt will complete toilet transfer with mod A OT Short Term Goal 4 (Week 4): Pt will perfom shower transfer with mod A  Skilled Therapeutic Interventions/Progress Updates:    Pt initially engaged in donning shirt and pants before transitioning to therapy gym for table activities.  Pt required assistance with orientation of shirt and pants before donning.  Pt engaged in Connect 4 and "War" seated in w/c.  Pt required max verbal cues to determine winner while playing Connect 4.  Pt was able to determine winner of each hand of War approx 90% of time.  Pt was able to determine winner of overall game.  Pt continuously asked what time it was and perseverated on eating lunch.  Focus on activity tolerance, cognitive remediation, task initiation, sequencing, orientation, and safety awareness.  Therapy Documentation Precautions:  Precautions Precautions: Fall Precaution Comments: abdominal binder over peg, R crani in abdomen, agitation at times Required Braces or Orthoses: Other Brace/Splint Cervical Brace: Other (comment) Other Brace/Splint: Cervical Collar D/C'ed Restrictions Weight Bearing Restrictions: No Other Position/Activity Restrictions: WBAT LLE Pain: Pain Assessment Pain Assessment: No/denies pain  See FIM for current functional status  Therapy/Group: Individual Therapy  [name] 04/21/2014, 12:13 PM

## 2014-04-21 NOTE — Progress Notes (Signed)
No agitation thus far this shift. Condom cath at [facility] to manage incontinence. Abdominal binder over Peg tube for protection. Continues with suicide sitter. Victor [name]

## 2014-04-21 NOTE — Progress Notes (Signed)
Speech Language Pathology Daily Session Note  Patient Details  Name: Victor [name] MRN: [id number] Date of Birth: 05-07-1991  Today's Date: 04/21/2014 SLP Individual Time: 0511-0211 SLP Individual Time Calculation (min): 30 min  Short Term Goals: Week 4: SLP Short Term Goal 1 (Week 4): Patient will utilize swallowing compensatory strategies with Min A multimodal cueing with current diet to minimize overt s/s of aspiration. SLP Short Term Goal 2 (Week 4): Patient with orient to place and situation with Mod A multimodal cues. SLP Short Term Goal 3 (Week 4): Patient will verbalize 1 physical and 1 cognitive deficit with Mod A question cues. SLP Short Term Goal 4 (Week 4): Patient will attend to basic self-care tasks for 15 minutes with Supervision cues for redirections. SLP Short Term Goal 5 (Week 4): Patient will initate set-up with meals/snacks with Min A multimodal cues.  SLP Short Term Goal 6 (Week 4): Patient will consume trials of thin liquids with minimal overt s/s of aspiration with Min A multimodal cues to utilize swallowing compensatory strategy of small sips.  Skilled Therapeutic Interventions: Pt was seen for skilled speech therapy targeting dysphagia goals.  Pt was seated upright in bed upon arrival, awake, alert, and agreeable to participate in ST.  Sitter d/c'd per Psychologist, sport and exercise.  Pt declined trials of water but was agreeable to a functional snack.  Pt exhibited no overt s/s of aspiration with presentations of magic cup consistency with supervision for slow rate and small bites and sips.  Pt sustained his attention to self feeding tasks for the duration of this 30 minute treatment session.  Continue per current plan of care.    FIM:  Comprehension Comprehension Mode: Auditory Comprehension: 3-Understands basic 50 - 74% of the time/requires cueing 25 - 50%  of the time Expression Expression Mode: Verbal Expression: 3-Expresses basic 50 - 74% of the time/requires cueing 25 - 50% of the  time. Needs to repeat parts of sentences. Social Interaction Social Interaction: 2-Interacts appropriately 25 - 49% of time - Needs frequent redirection. Problem Solving Problem Solving: 2-Solves basic 25 - 49% of the time - needs direction more than half the time to initiate, plan or complete simple activities Memory Memory: 2-Recognizes or recalls 25 - 49% of the time/requires cueing 51 - 75% of the time FIM - Eating Eating Activity: 5: Needs verbal cues/supervision  Pain Pain Assessment Pain Assessment: No/denies pain  Therapy/Group: Individual Therapy  Jackalyn Lombard, M.A. CCC-SLP  Rhona Fusilier, [name] 04/21/2014, 4:57 PM

## 2014-04-21 NOTE — Progress Notes (Signed)
Physical Therapy Session Note  Patient Details  Name: Victor [name]: [id number] Date of Birth: 07-18-1991  Today's Date: 04/21/2014 PT Individual Time: Treatment Session 1: 1000-1100; Treatment Session 2: 1640-1710 PT Individual Time Calculation (min): Treatment Session 1: 60 min ; Treatment Session 2:  Short Term Goals: Week 4:  PT Short Term Goal 1 (Week 4): Patient will perform functional transfers with modA x1. PT Short Term Goal 2 (Week 4): Patient will perform gait training 30' x1 with maxA. PT Short Term Goal 3 (Week 4): Patient will negotiate 3 steps with B handrails and maxA x1  Skilled Therapeutic Interventions/Progress Updates:  Treatment Session 1:  1:1. Pt received sitting in room with sitter at side, ready for therapy. Focus this session on cognitive remediation, functional w/c propulsion, toileting, balance and gait training. Overall, pt req mod cues for orientation and selective attention throughout tx session. Pt with 2 episodes of mild agitation, but easily redirected. Pt req supervision for w/c propulsion 200'x2 and 300'x1 with min cues for steering and obstacle negotiation on R side.   Pt with good tolerance to ambulation 100'x1 and 175'x2 w/ weighted RW and mod Ax1person, +2 for w/c follow. Pt with improved trunk flexion to neutral position with use of RW and demonstrating reciprocal, but R steppage gait pattern.   Pt propelled self back to room due to noted bowel incontinence. Pt req min A for SPT w/c<>toilet w/ use of grab bar and max A for clean up.  With second person, pt engaged in standing football toss to facilitate anterior weight shift, dynamic standing balance and B UE use. Pt req mod-max A x1person to maintain balance.  Pt left sitting in w/c at end of session w/ all needs in reach, quick release belt in place. No sitter present as they were d/c due to RN report.   Treatment Session 2:  1:1. Pt received supine in bed, ready for therapy. Focus this  session on functional transfers, functional w/c propulsion and therex. Pt req supervision for t/f sup>sit EOB and min A for lateral scoot t/f bed>w/c. Pt req min A for w/c propulsion 180'x2 w/ use of B LE and intermittent use of B UE. Mod A for squat pivot t/f w/c<>NuStep. Pt utilized NuStep for B UE/LE strength, endurance and B heel cord stretch, level 5x70min with good tolerance. Pt left sitting in w/c at end of session w/ quick release belt in place, in care of nurse tech for dinner.   Therapy Documentation Precautions:  Precautions Precautions: Fall Precaution Comments: abdominal binder over peg, R crani in abdomen, agitation at times Required Braces or Orthoses: Other Brace/Splint Cervical Brace: Other (comment) Other Brace/Splint: Cervical Collar D/C'ed Restrictions Weight Bearing Restrictions: No Other Position/Activity Restrictions: WBAT LLE Pain: Pain Assessment Pain Assessment: No/denies pain  See FIM for current functional status  Therapy/Group: Individual Therapy  Denzil Hughes 04/21/2014, 12:34 PM
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
## 2014-04-21 NOTE — Progress Notes (Signed)
Speech Language Pathology Daily Session Notes  Patient Details  Name: Victor [name] MRN: [id number] Date of Birth: 08-01-1990  Today's Date: 04/21/2014  Session 1: SLP Individual Time: 0800-0900 SLP Individual Time Calculation (min): 60 min  Session 2: SLP Individual Time: 1400-1430 SLP Individual Time Calculation: 30 min  Short Term Goals: Week 4: SLP Short Term Goal 1 (Week 4): Patient will utilize swallowing compensatory strategies with Min A multimodal cueing with current diet to minimize overt s/s of aspiration. SLP Short Term Goal 2 (Week 4): Patient with orient to place and situation with Mod A multimodal cues. SLP Short Term Goal 3 (Week 4): Patient will verbalize 1 physical and 1 cognitive deficit with Mod A question cues. SLP Short Term Goal 4 (Week 4): Patient will attend to basic self-care tasks for 15 minutes with Supervision cues for redirections. SLP Short Term Goal 5 (Week 4): Patient will initate set-up with meals/snacks with Min A multimodal cues.  SLP Short Term Goal 6 (Week 4): Patient will consume trials of thin liquids with minimal overt s/s of aspiration with Min A multimodal cues to utilize swallowing compensatory strategy of small sips.  Skilled Therapeutic Interventions:  Session 1: Skilled treatment session focused on cognitive-linguistic goals. Upon arrival, patient was awake while upright in bed and patient was transferred to the wheelchair by his sitter. SLP facilitated session by providing Min A multimodal cues for orientation to time, place and situation. Patient participated in a generative naming task with focus on attention and awareness in order to reduce perseveration and language of confusion. The patient required total A for oral reading and comprehension at the word level and Mod A multimodal cues for word-finding with the task. The patient also required Mod A multimodal cues for redirection throughout the task due to verbal perseveration, however,  demonstrated increased awareness with abilities to self-correct language of confusion with Max A multimodal cues.  Patient left with quick release belt in place and sitter present. Continue with current plan of care.    Session 2: Skilled treatment session focused on dysphagia and cognitive-linguistic goals. Upon arrival, patient was awake while sitting upright in the bed. SLP facilitated session by providing thorough oral care via the suction toothbrushes. Patient consumed trials of ice chips and thin liquids via spoon and cup without overt s/s of aspiration but demonstrated a suspected delayed swallow initiation. Patient independently verbalized he is unable to walk or live by himself at this time due to his deficits which is indicative of increased intellectual awareness. The patient's overall orientation and awareness seems to be improving, although, it can be fleeting at times. Continue with current plan of care.   FIM:  Comprehension Comprehension Mode: Auditory Comprehension: 3-Understands basic 50 - 74% of the time/requires cueing 25 - 50%  of the time Expression Expression Mode: Verbal Expression: 3-Expresses basic 50 - 74% of the time/requires cueing 25 - 50% of the time. Needs to repeat parts of sentences. Social Interaction Social Interaction: 2-Interacts appropriately 25 - 49% of time - Needs frequent redirection. Problem Solving Problem Solving: 2-Solves basic 25 - 49% of the time - needs direction more than half the time to initiate, plan or complete simple activities Memory Memory: 2-Recognizes or recalls 25 - 49% of the time/requires cueing 51 - 75% of the time FIM - Eating Eating Activity: 5: Needs verbal cues/supervision  Pain Pain Assessment Pain Assessment: No/denies pain  Therapy/Group: Individual Therapy  Tauno Falotico 04/21/2014, 3:30 PM

## 2014-04-22 ENCOUNTER — Inpatient Hospital Stay (HOSPITAL_COMMUNITY): Payer: Self-pay | Admitting: Speech Pathology

## 2014-04-22 ENCOUNTER — Inpatient Hospital Stay (HOSPITAL_COMMUNITY): Payer: Medicaid Other | Admitting: *Deleted

## 2014-04-22 ENCOUNTER — Inpatient Hospital Stay (HOSPITAL_COMMUNITY): Payer: Medicaid Other | Admitting: Speech Pathology

## 2014-04-22 ENCOUNTER — Inpatient Hospital Stay (HOSPITAL_COMMUNITY): Payer: Medicaid Other

## 2014-04-22 ENCOUNTER — Inpatient Hospital Stay (HOSPITAL_COMMUNITY): Payer: Self-pay | Admitting: Physical Therapy

## 2014-04-22 ENCOUNTER — Inpatient Hospital Stay (HOSPITAL_COMMUNITY): Payer: Self-pay | Admitting: *Deleted

## 2014-04-22 NOTE — Plan of Care (Signed)
Problem: RH BLADDER ELIMINATION Goal: RH STG MANAGE BLADDER WITH ASSISTANCE STG Manage Bladder With total Assistance  Outcome: Not Progressing Wears condom cath. At night. incont.

## 2014-04-22 NOTE — Progress Notes (Signed)
Speech Language Pathology Daily Session Note  Patient Details  Name: Victor [name] MRN: [id number] Date of Birth: 05/21/1991  Today's Date: 04/22/2014 SLP Individual Time: 0800-0900 SLP Individual Time Calculation (min): 60 min  Short Term Goals: Week 4: SLP Short Term Goal 1 (Week 4): Patient will utilize swallowing compensatory strategies with Min A multimodal cueing with current diet to minimize overt s/s of aspiration. SLP Short Term Goal 2 (Week 4): Patient with orient to place and situation with Mod A multimodal cues. SLP Short Term Goal 3 (Week 4): Patient will verbalize 1 physical and 1 cognitive deficit with Mod A question cues. SLP Short Term Goal 4 (Week 4): Patient will attend to basic self-care tasks for 15 minutes with Supervision cues for redirections. SLP Short Term Goal 5 (Week 4): Patient will initate set-up with meals/snacks with Min A multimodal cues.  SLP Short Term Goal 6 (Week 4): Patient will consume trials of thin liquids with minimal overt s/s of aspiration with Min A multimodal cues to utilize swallowing compensatory strategy of small sips.  Skilled Therapeutic Interventions: Skilled treatment session focused on dysphagia and cognitive-linguistic goals. Upon arrival, patient was awake while upright in bed eating breakfast tray of Dys. 1 textures with nectar-thick liquids. Patient required Min A verbal cues for utilization of swallowing compensatory strategy of small bites and did not demonstrate any overt s/s of aspiration. Patient also was Mod I for sustained attention to task but required Mod A multimodal cues for self-correcting and self-monitoring of suite and number identification during a basic card game. Patient demonstrated emergent awareness of difficulty with task and reported, "I mix things up sometimes and have to look again". Patient also required Supervision question cues for orientation to place and Mod-Max A multimodal cues for orientation to time and  situation. Patient demonstrated perseveration on leaving hospital to go back to a group home but was easily redirected. Continue with current plan of care.   FIM:  Comprehension Comprehension Mode: Auditory Comprehension: 3-Understands basic 50 - 74% of the time/requires cueing 25 - 50%  of the time Expression Expression Mode: Verbal Expression: 4-Expresses basic 75 - 89% of the time/requires cueing 10 - 24% of the time. Needs helper to occlude trach/needs to repeat words. Social Interaction Social Interaction: 2-Interacts appropriately 25 - 49% of time - Needs frequent redirection. Problem Solving Problem Solving: 2-Solves basic 25 - 49% of the time - needs direction more than half the time to initiate, plan or complete simple activities Memory Memory: 1-Recognizes or recalls less than 25% of the time/requires cueing greater than 75% of the time FIM - Eating Eating Activity: 5: Needs verbal cues/supervision  Pain Pain Assessment Pain Assessment: No/denies pain  Therapy/Group: Individual Therapy  Jayceon Troy 04/22/2014, 12:28 PM

## 2014-04-22 NOTE — Progress Notes (Signed)
Physical Therapy Session Note  Patient Details  Name: Victor [name] MRN: [id number] Date of Birth: 1990/08/05  Today's Date: 04/22/2014 PT Individual Time: 1100-1200 PT Individual Time Calculation (min): 60 min   Short Term Goals: Week 4:  PT Short Term Goal 1 (Week 4): Patient will perform functional transfers with modA x1. PT Short Term Goal 2 (Week 4): Patient will perform gait training 30' x1 with maxA. PT Short Term Goal 3 (Week 4): Patient will negotiate 3 steps with B handrails and maxA x1  Skilled Therapeutic Interventions/Progress Updates:   Pt received sitting in w/c in family room with uncle, agreeable to session.  Skilled session focused on NMR through standing balance activities and gait.  Also addressed sustained attention, ability to follow one and two step commands, and memory.  Pt self propelled to/from therapy gym in w/c using BUEs/LEs for increased coordination with reciprocal patterns <150'.  Once in therapy gym, discussed gait in hallway.  Pt initially refused, but uncle convinced pt to walk.  Performed gait x 50' with 2 person HHA, however progressed to having tech assist from behind pt and having pt place BUEs on therapists shoulders to apply resistance to gait and increase pts forward weight shift during gait.  Note good stride length and improvement in forward trunk lean.  Performed another 120' x 2 reps with standing rest breaks in between.  Pt very perseverative on "going to group home" during our session, but was easily re-directed to task with min cues.  Progressed to performing seated and standing ball toss to rebounder with S in sitting and mod/max A in standing due to posterior lean.  Progressed to tossing 4lb ball to/from rebounder to increase pts use of LUE.  Continued to require cues throughout to utilize LUE, as he would end up catching ball with chest and R UE and tossing with RUE.  Ended session with foot ball toss to uncle at max A level (+2 assist for safety) with  cues for forward step and follow through to continue to increase forward weight shift in standing.  Pt assisted back to w/c at min A level via squat pivot, max verbal cues for safety.  Pt propelled back to nursing station and left in w/c with quick release belt donned, RN aware.  (pts room being worked on, hence pt at nursing station for safety)  Therapy Documentation Precautions:  Precautions Precautions: Fall Precaution Comments: abdominal binder over peg, R crani in abdomen, agitation at times Required Braces or Orthoses: Other Brace/Splint Cervical Brace: Other (comment) Other Brace/Splint: Cervical Collar D/C'ed Restrictions Weight Bearing Restrictions: No Other Position/Activity Restrictions: WBAT LLE   Pain: Pain Assessment Pain Assessment: No/denies pain   Locomotion : Ambulation Ambulation/Gait Assistance: 1: +2 Total assist Wheelchair Mobility Distance: 150   See FIM for current functional status  Therapy/Group: Individual Therapy  Vista Deck 04/22/2014, 1:11 PM

## 2014-04-22 NOTE — Plan of Care (Signed)
Problem: RH SAFETY Goal: RH STG ADHERE TO SAFETY PRECAUTIONS W/ASSISTANCE/DEVICE STG Adhere to Safety Precautions With total Assistance/Device.  Outcome: Progressing Poor safety awareness. Safety plan in place

## 2014-04-22 NOTE — Progress Notes (Addendum)
Physical Therapy Session Note  Patient Details  Name: Victor [name] MRN: [id number] Date of Birth: [birth year]  Today's Date: 04/22/2014 PT Individual Time: [id number] PT Individual Time Calculation (min): 30 min   Short Term Goals: Week 4:  PT Short Term Goal 1 (Week 4): Patient will perform functional transfers with modA x1. PT Short Term Goal 2 (Week 4): Patient will perform gait training 30' x1 with maxA. PT Short Term Goal 3 (Week 4): Patient will negotiate 3 steps with B handrails and maxA x1  Skilled Therapeutic Interventions/Progress Updates:    2:1. Pt received seated in w/c at nurses' station with quick release belt on; agreed to therapy with mod coaxing. Session focused on functional ambulation, stair negotiation, and increasing proximal stability. Pt required max coaxing to participate in all functional mobility/activities. Frequent redirection also required due to pt language/behavior frequently shifting from euphoric to aggressive without change in environment, external stimuli. Pt performed ambulation x45' in controlled environment with +2A, bilat HHA with posterior preference throughout. Negotiated 5 stairs with 2 rails and +2A for stability, max cueing for safety due to pt resisting hands-on assist. Pt performed tall kneeling with bilat UE support at large bench while throwing/catching football with min-mod A. Tall kneeling activity focused sustained attention, facilitating co-contraction and motor control in proximal muscle groups.. Pt left seated in w/c at nurses' station with quick release belt in place for safety.  Therapy Documentation Precautions:  Precautions Precautions: Fall Precaution Comments: abdominal binder over peg, R crani in abdomen, agitation at times Required Braces or Orthoses: Other Brace/Splint Cervical Brace: Other (comment) Other Brace/Splint: Cervical Collar D/C'ed Restrictions Weight Bearing Restrictions: No Other Position/Activity Restrictions: WBAT  LLE Pain: Pain Assessment Pain Assessment: No/denies pain Pain Score: 0-No pain Locomotion : Ambulation Ambulation/Gait Assistance: 1: +2 Total assist   See FIM for current functional status  Therapy/Group: Individual Therapy  Hobble, Lorenda IshiharaBlair A 04/22/2014, 5:54 PM

## 2014-04-22 NOTE — Progress Notes (Signed)
Speech Language Pathology Daily Session Note  Patient Details  Name: Victor [name] MRN: [id number] Date of Birth: 1990-12-13  Today's Date: 04/22/2014 SLP Individual Time: 1345-1415 SLP Individual Time Calculation (min): 30 min  Short Term Goals: Week 4: SLP Short Term Goal 1 (Week 4): Patient will utilize swallowing compensatory strategies with Min A multimodal cueing with current diet to minimize overt s/s of aspiration. SLP Short Term Goal 2 (Week 4): Patient with orient to place and situation with Mod A multimodal cues. SLP Short Term Goal 3 (Week 4): Patient will verbalize 1 physical and 1 cognitive deficit with Mod A question cues. SLP Short Term Goal 4 (Week 4): Patient will attend to basic self-care tasks for 15 minutes with Supervision cues for redirections. SLP Short Term Goal 5 (Week 4): Patient will initate set-up with meals/snacks with Min A multimodal cues.  SLP Short Term Goal 6 (Week 4): Patient will consume trials of thin liquids with minimal overt s/s of aspiration with Min A multimodal cues to utilize swallowing compensatory strategy of small sips.  Skilled Therapeutic Interventions:  Pt was seen for skilled speech therapy targeting dysphagia goals.  SLP facilitated the session with trials of regular water via cup sips following thorough oral care.  Pt exhibited perseverative swishing behaviors with water trials following oral care which resulted in oral holding and suspected delayed swallow initiation.  No overt s/s of aspiration noted.  Spoke with primary SLP who feels pt is ready for repeat MBS to objectively determine readiness for liquids advancement.  Continue per current plan of care.     FIM:  Comprehension Comprehension Mode: Auditory Comprehension: 3-Understands basic 50 - 74% of the time/requires cueing 25 - 50%  of the time Expression Expression Mode: Verbal Expression: 3-Expresses basic 50 - 74% of the time/requires cueing 25 - 50% of the time. Needs to repeat  parts of sentences. Social Interaction Social Interaction: 2-Interacts appropriately 25 - 49% of time - Needs frequent redirection. Problem Solving Problem Solving: 2-Solves basic 25 - 49% of the time - needs direction more than half the time to initiate, plan or complete simple activities Memory Memory: 1-Recognizes or recalls less than 25% of the time/requires cueing greater than 75% of the time  Pain Pain Assessment Pain Assessment: No/denies pain  Therapy/Group: Individual Therapy  Jackalyn Lombard, M.A. CCC-SLP  [name], [name] 04/22/2014, 4:33 PM
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
## 2014-04-22 NOTE — Progress Notes (Signed)
The skilled treatment note has been reviewed and SLP is in agreement.  Lita Flynn, M.A., CCC-SLP  [phone]   

## 2014-04-22 NOTE — Progress Notes (Signed)
Physical Therapy Session Note  Patient Details  Name: Victor [name] MRN: [id number] Date of Birth: Nov 12, 1990  Today's Date: 04/22/2014 PT Individual Time: 1620-1650 PT Individual Time Calculation (min): 30 min   Short Term Goals: Week 4:  PT Short Term Goal 1 (Week 4): Patient will perform functional transfers with modA x1. PT Short Term Goal 2 (Week 4): Patient will perform gait training 30' x1 with maxA. PT Short Term Goal 3 (Week 4): Patient will negotiate 3 steps with B handrails and maxA x1  Skilled Therapeutic Interventions/Progress Updates:    Patient received sitting in wheelchair at RN station. Session focused on sustained attention, sequencing, and problem solving with cognitive remediation activity. Patient engaged in pipe tree activity, completing cross and field goal designs with only min cues for initiation/proper initial placement of piece, then no cues for completion. With next harder square design, patient requires max-total cues for accuracy and demonstrates poor frustration tolerance and increasing verbal agitation, calling therapist "dummy" and cursing. When patient requested to go back to his room, therapist stated that he would not be brought back to room until he stopped cursing and apologized. Patient continues to demonstrate verbal agitation, but after one minute of sitting without speaking, patient able to apologize and recall why he needed to apologize. Patient left sitting in wheelchair at RN station with seatbelt donned.  Therapy Documentation Precautions:  Precautions Precautions: Fall Precaution Comments: abdominal binder over peg, R crani in abdomen, agitation at times Required Braces or Orthoses: Other Brace/Splint Cervical Brace: Other (comment) Other Brace/Splint: Cervical Collar D/C'ed Restrictions Weight Bearing Restrictions: No Other Position/Activity Restrictions: WBAT LLE Pain: Pain Assessment Pain Assessment: No/denies pain Pain Score: 0-No  pain Locomotion : Ambulation Ambulation/Gait Assistance: Not tested (comment) Wheelchair Mobility Distance: 150   See FIM for current functional status  Therapy/Group: Individual Therapy  [name] Jaliah Foody, PT, DPT 04/22/2014, 4:45 PM

## 2014-04-22 NOTE — Progress Notes (Signed)
Occupational Therapy Session Note  Patient Details  Name: Enos Aikins MRN: [id number] Date of Birth: Jul 17, 1991  Today's Date: 04/22/2014 OT Individual Time: 1017-5102 OT Individual Time Calculation (min): 60 min    Short Term Goals: Week 4:  OT Short Term Goal 1 (Week 4): Patient will complete 2 grooming tasks in standing with min assist for balance OT Short Term Goal 2 (Week 4): Pt will consistently be oriented X 3 with min verbal cues OT Short Term Goal 3 (Week 4): Pt will complete toilet transfer with mod A OT Short Term Goal 4 (Week 4): Pt will perfom shower transfer with mod A  Skilled Therapeutic Interventions/Progress Updates:    Pt seen for ADL retraining with focus on functional transfers, cognitive remediation, standing balance, and safety awareness. Pt received sitting up in bed, oriented to person and place. Pt perseverating on group home and meal times throughout session, requiring max cues to redirect. Competed bathing at sink with pt initiating task and asking for appropriate bathing items 90% of time. Pt required mod cues for sequencing during bathing task. Pt incontinent of bowel during session. Completed stand pivot transfer w/c<>toilet with mod assist and min cues. Pt required mod assist for standing balance with use of grab bars during toilet hygiene. Pt with no clean clothes this AM, therefore donning brief, gown, and shoes only. Pt propelled self in w/c with BUE to family room with max cues for attention to L and mod cues for use of LUE. Pt left sitting in family room with QRB donned and uncle present.   Therapy Documentation Precautions:  Precautions Precautions: Fall Precaution Comments: abdominal binder over peg, R crani in abdomen, agitation at times Required Braces or Orthoses: Other Brace/Splint Cervical Brace: Other (comment) Other Brace/Splint: Cervical Collar D/C'ed Restrictions Weight Bearing Restrictions: No Other Position/Activity Restrictions: WBAT  LLE General:   Vital Signs:   Pain: Pain Assessment Pain Assessment: No/denies pain  See FIM for current functional status  Therapy/Group: Individual Therapy  [name] 04/22/2014, 12:34 PM

## 2014-04-22 NOTE — Plan of Care (Signed)
Problem: RH Bathing Goal: LTG Patient will bathe with assist, cues/equipment (OT) LTG: Patient will bathe specified number of body parts with assist with/without cues using equipment (position) (OT)  Downgraded to min assist due slow progress in therapy, poor postural control, impaired cognition, and decreased balance  Problem: RH Toileting Goal: LTG Patient will perform toileting w/assist, cues/equip (OT) LTG: Patient will perform toiletiing (clothes management/hygiene) with assist, with/without cues using equipment (OT)  Downgraded to min assist due slow progress in therapy, poor postural control, impaired cognition, and decreased balance  Problem: RH Tub/Shower Transfers Goal: LTG Patient will perform tub/shower transfers w/assist (OT) LTG: Patient will perform tub/shower transfers with assist, with/without cues using equipment (OT)  Downgraded to min assist due slow progress in therapy, poor postural control, impaired cognition, and decreased balance

## 2014-04-22 NOTE — Plan of Care (Signed)
Problem: RH BOWEL ELIMINATION Goal: RH STG MANAGE BOWEL WITH ASSISTANCE STG Manage Bowel with total Assistance.  Outcome: Not Progressing Incont. Depends on staff

## 2014-04-23 ENCOUNTER — Inpatient Hospital Stay (HOSPITAL_COMMUNITY): Payer: Self-pay

## 2014-04-23 ENCOUNTER — Inpatient Hospital Stay (HOSPITAL_COMMUNITY): Payer: Medicaid Other

## 2014-04-23 ENCOUNTER — Inpatient Hospital Stay (HOSPITAL_COMMUNITY): Payer: Medicaid Other | Admitting: Occupational Therapy

## 2014-04-23 ENCOUNTER — Inpatient Hospital Stay (HOSPITAL_COMMUNITY): Payer: Medicaid Other | Admitting: Speech Pathology

## 2014-04-23 DIAGNOSIS — S069XAA Unspecified intracranial injury with loss of consciousness status unknown, initial encounter: Secondary | ICD-10-CM

## 2014-04-23 DIAGNOSIS — W19XXXA Unspecified fall, initial encounter: Secondary | ICD-10-CM

## 2014-04-23 DIAGNOSIS — S069X9A Unspecified intracranial injury with loss of consciousness of unspecified duration, initial encounter: Secondary | ICD-10-CM

## 2014-04-23 NOTE — Progress Notes (Signed)
Occupational Therapy Session Note  Patient Details  Name: Victor [name] MRN: [id number] Date of Birth: Dec 20, 1990  Today's Date: 04/23/2014 OT Individual Time: [id number] and [id number] OT Individual Time Calculation (min): 60 min and 61 min     Short Term Goals: Week 4:  OT Short Term Goal 1 (Week 4): Patient will complete 2 grooming tasks in standing with min assist for balance OT Short Term Goal 2 (Week 4): Pt will consistently be oriented X 3 with min verbal cues OT Short Term Goal 3 (Week 4): Pt will complete toilet transfer with mod A OT Short Term Goal 4 (Week 4): Pt will perfom shower transfer with mod A  Skilled Therapeutic Interventions/Progress Updates:    Session 1: Pt seen for ADL retraining with focus on cognitive remediation, functional transfers, attention, and standing balance. Pt received supine in bed requiring total assist for orientation to place and time (pt recalled year but thought it was February). Ambulated to walk-in shower with +2 HHA. Completed bathing with min cues for sequencing and organization. Completed stand pivot transfer TTB>w/c with mod assist and use of grab bars. Completed dressing at sink with min assist for standing balance 3x. Donned shoes while leaving laces fastened and pt required min multimodal cues for fixing heel of shoe. Engaged in all grooming tasks with setup assist and min cues. Pt requesting appropriate items for ADLs 70% of time. Pt with recall of orientation to person, place, and time without cues and mod cues for orientation to situation at end of session. Pt perseverating on meal times (recalled time of lunch correctly) and group homes throughout session, however easily redirected. Pt left sitting in w/c at nurses station with quick release belt donned.  Session 2: Pt seen for 1:1 OT session with focus on cognitive remediation, attention, functional use of LUE, postural control, and balance. Pt received sitting in w/c and excited about going  outside. Pt oriented to person and place, requiring mod questioning cues for orientation to time, and total assist for orientation to situation. Engaged in letter ball toss from w/c level with pt attending to letter near L hand. Pt with 100% accuracy when reading letter. Pt required mod cues for problem solving to identify food then animal that began with specific letter. Pt identified correct object without cues ~10% of time. Pt attended to task in community environment up to 2 min with min cues. Engaged in weighted ball toss sitting EOM initially with BLE supported, then progressed to BLEs unsupported to increase trunk control. Pt required supervision for sitting balance during task. Pt required max cues for use of bil hands to catch ball, and tossed ball 4x with LUE. Pt with incontinent episode at end of session. Completed hygiene at sink with +2 assist for time, pt requiring min assist for standing. Pt left sitting in w/c with quick release belt donned and all needs in reach.   Therapy Documentation Precautions:  Precautions Precautions: Fall Precaution Comments: abdominal binder over peg, R crani in abdomen, agitation at times Required Braces or Orthoses: Other Brace/Splint Cervical Brace: Other (comment) Other Brace/Splint: Cervical Collar D/C'ed Restrictions Weight Bearing Restrictions: No Other Position/Activity Restrictions: WBAT LLE General:   Vital Signs: Therapy Vitals Temp: 98 F (36.7 C) Temp src: Oral Pulse Rate: 73 Resp: 18 BP: 130/82 mmHg Patient Position (if appropriate): Lying Oxygen Therapy SpO2: 100 % O2 Device: None (Room air) Pain: Pt with no report of pain during therapy sessions.  See FIM for current functional status  Therapy/Group:  Individual Therapy  [name] 04/23/2014, 9:48 AM

## 2014-04-23 NOTE — Progress Notes (Signed)
Physical Therapy Session Note  Patient Details  Name: Victor [name] MRN: [id number] Date of Birth: 02/01/1991  Today's Date: 04/23/2014 PT Individual Time: 1100-1200 PT Individual Time Calculation (min): 60 min   Short Term Goals: Week 4:  PT Short Term Goal 1 (Week 4): Patient will perform functional transfers with modA x1. PT Short Term Goal 2 (Week 4): Patient will perform gait training 30' x1 with maxA. PT Short Term Goal 3 (Week 4): Patient will negotiate 3 steps with B handrails and maxA x1  Skilled Therapeutic Interventions/Progress Updates:    Session 1: Pt received supine in bed, agreeable to participate in therapy. Pt moved supine>sit w/ close supervision, then performed squat pivot transfer to L bed>w/c w/ ModA. Therapist transported wheelchair to hospital hallway and pt propelled w/c remaining distance to rehab gym, including several turns and maneuvering through door way with MinA. In rehab gym pt performed squat pivot transfer to L w/c>mat w/ +2A for safety, Mod-Max physical assist. Pt participated in evaluation with Trunk Impairment Scale to evaluate sitting balance. Pt scored 15/23 total, with subscores of 7/7 in static sitting, 6/10 in dynamic sitting, and 2/6 in coordination. Pt then found to be incontinent of bowel, performed several sit<>stands at mat table w/ Mod-MaxA for therapists to change gown and brief. Pt ambulated w/ EVA Walker 150' w/ Mod physical assist, +2 to manage clothing (holding brief up) and keep walker on straight path. Noted steppage gait, but improved upright posture and forward flexion w/ use of EVA walker. Pt left seated in w/c w/ all needs within reach.   Session 2: Pt received seated in w/c at nurse's station, agreeable to participate in therapy. Pt propelled w/c to rehab gym 100' w/ MinA and BUE. Squat pivot transfer w/c>mat w/ MaxA to R. Engaged pt in orientation/awareness conversations for much of session, pt able to intermittently identify place and city  ~25% of the time and able to ID reason for hospital admission 1/2 times w/ min cueing. Pt engaged in reaching task from seated after refusing to attempt task from standing w/ multiple colored horseshoes placed in basket on floor at min LOS w/ pt instructed to reach for certain color and pass them to therapist. Pt able to ID correct color with 75% accuracy (inaccurate attempts seemed to be behavioral, with pt insisting that incorrect horseshoe was different color). Pt engaged in dynamic standing balance task of dribbling basketball and passing basketball with therapy tech while standing with B knees against mat table and CGA-ModA from therapist. Pt w/ squat pivot transfer to L mat>w/c w/ MaxA, pt propelled w/c back to nurse's station w/ MinA. Left seated at nurse's station w/ all needs within reach.  Therapy Documentation Precautions:  Precautions Precautions: Fall Precaution Comments: abdominal binder over peg, R crani in abdomen, agitation at times Required Braces or Orthoses: Other Brace/Splint Cervical Brace: Other (comment) Other Brace/Splint: Cervical Collar D/C'ed Restrictions Weight Bearing Restrictions: No Other Position/Activity Restrictions: WBAT LLE General:   Vital Signs:   Pain: Pain Assessment Pain Score: 6  Pain Type: Acute pain Pain Location: Abdomen Pain Descriptors / Indicators: Aching Pain Intervention(s): Medication (See eMAR) Mobility:   Locomotion : Ambulation Ambulation/Gait Assistance:  (+2 for clothing management and walker management, Mod physical assist from both)  Trunk/Postural Assessment :    Balance:   Exercises:   Other Treatments:    See FIM for current functional status  Therapy/Group: Individual Therapy  Hosie Spangle Hosie Spangle, PT, DPT 04/23/2014, 5:31 PM
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
## 2014-04-23 NOTE — Progress Notes (Signed)
MBSS complete. Full report located under chart review in imaging section.  Feliberto Gottronourtney Karyn Brull, [place], CCC-SLP [phone]
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
## 2014-04-24 ENCOUNTER — Inpatient Hospital Stay (HOSPITAL_COMMUNITY): Payer: Medicaid Other

## 2014-04-24 ENCOUNTER — Inpatient Hospital Stay (HOSPITAL_COMMUNITY): Payer: Medicaid Other | Admitting: *Deleted

## 2014-04-24 ENCOUNTER — Inpatient Hospital Stay (HOSPITAL_COMMUNITY): Payer: Medicaid Other | Admitting: Speech Pathology

## 2014-04-24 DIAGNOSIS — R1314 Dysphagia, pharyngoesophageal phase: Secondary | ICD-10-CM | POA: Diagnosis present

## 2014-04-24 DIAGNOSIS — S82402S Unspecified fracture of shaft of left fibula, sequela: Secondary | ICD-10-CM

## 2014-04-24 DIAGNOSIS — I10 Essential (primary) hypertension: Secondary | ICD-10-CM

## 2014-04-24 DIAGNOSIS — S069X4S Unspecified intracranial injury with loss of consciousness of 6 hours to 24 hours, sequela: Secondary | ICD-10-CM

## 2014-04-24 DIAGNOSIS — S82202S Unspecified fracture of shaft of left tibia, sequela: Secondary | ICD-10-CM

## 2014-04-24 DIAGNOSIS — S0292XS Unspecified fracture of facial bones, sequela: Secondary | ICD-10-CM

## 2014-04-24 MED ORDER — FREE WATER
50.0000 mL | Freq: Two times a day (BID) | Status: DC
Start: 2014-04-24 — End: 2014-06-13

## 2014-04-24 MED ORDER — LORAZEPAM 0.5 MG PO TABS: 0.5000 mg | ORAL_TABLET | ORAL | Status: DC | PRN

## 2014-04-24 MED ORDER — POLYETHYLENE GLYCOL 3350 17 G PO PACK: 17.0000 g | PACK | Freq: Every day | ORAL | Status: DC

## 2014-04-24 MED ORDER — QUETIAPINE FUMARATE 100 MG PO TABS: 250.0000 mg | ORAL_TABLET | Freq: Two times a day (BID) | ORAL | Status: DC

## 2014-04-24 MED ORDER — HYDROCODONE-ACETAMINOPHEN 5-325 MG PO TABS: 1.0000 | ORAL_TABLET | ORAL | Status: DC | PRN

## 2014-04-24 MED ORDER — BACLOFEN 5 MG HALF TABLET: 5.0000 mg | ORAL_TABLET | Freq: Three times a day (TID) | ORAL | Status: DC

## 2014-04-24 NOTE — Progress Notes (Signed)
The skilled treatment note has been reviewed and SLP is in agreement.  Jonah Nestle, M.A., CCC-SLP  [phone]   
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
## 2014-04-24 NOTE — Plan of Care (Signed)
Problem: RH BLADDER ELIMINATION Goal: RH STG MANAGE BLADDER WITH ASSISTANCE STG Manage Bladder With total Assistance  Outcome: Not Met (add Reason) SNF placement-pt incont. Of bladder and has no awareness of having an episode. Timed tolieting not met for pt.     

## 2014-04-24 NOTE — Progress Notes (Signed)
Physical Therapy Session Note  Patient Details  Name: Victor [name] MRN: [id number] Date of Birth: 1990/09/03  Today's Date: 04/24/2014 PT Individual Time: 1100-1200 and 0347-4259 PT Individual Time Calculation (min): 60 min and  48 min  Short Term Goals: Week 4:  PT Short Term Goal 1 (Week 4): Patient will perform functional transfers with modA x1. PT Short Term Goal 2 (Week 4): Patient will perform gait training 30' x1 with maxA. PT Short Term Goal 3 (Week 4): Patient will negotiate 3 steps with B handrails and maxA x1  Skilled Therapeutic Interventions/Progress Updates:    AM Session: Patient received sitting in wheelchair, from SLP. Session focused on therapeutic conversation and gait training. First 40 minutes of session spent engaging patient in therapeutic conversation with [employer] fast streamer with emphasis on d/c planning, orientation, short term memory, long term memory, problem solving, awareness, etc. Patient continues to require supervision cues for orientation, max cues for problem solving, memory, awareness.  Gait training in controlled environment x70' with B HHA until noted that patient had been incontinent. Patient returned to room and nurse tech notified of patient status. Rehab tech remains in room until nurse tech arrives.  PM Session: Patient received sitting in wheelchair. Session focused on wheelchair mobility, functional transfers, and stair negotiation. Patient performed wheelchair mobility 150' x2 with B UE/LE and supervision, mod cues for sequencing and obstacle negotiation. Functional squat pivot transfers with modA secondary to significant posterior LOB/extension during transfer. Patient becoming agitated with therapist's hand placement when attempting to stand to walk and refuses to do so, becoming verbally agitated, calling therapist "dummy" and "crazy" and using curse words.  Unable to engage patient in gait training and requires 10' of sitting without interaction to  de-escalate. Patient then apologetic to therapist. Patient able to be redirected to perform stair negotiation: 4 stairs with B handrails and +2 assist (modA of 2) secondary to trunk extension, poor B foot placement on step, and poor sequencing with impulsivity.  Patient returned to RN station and left sitting in wheelchair with seatbelt donned.  Therapy Documentation Precautions:  Precautions Precautions: Fall Precaution Comments: abdominal binder over peg, R crani in abdomen, agitation at times Required Braces or Orthoses: Other Brace/Splint Cervical Brace: Other (comment) Other Brace/Splint: Cervical Collar D/C'ed Restrictions Weight Bearing Restrictions: No Other Position/Activity Restrictions: WBAT LLE General: PT Amount of Missed Time (min): 12 Minutes PT Missed Treatment Reason: Increased agitation Pain: Pain Assessment Pain Assessment: No/denies pain Pain Score: 0-No pain Locomotion : Ambulation Ambulation/Gait Assistance: 1: +2 Total assist (B HHA)   See FIM for current functional status  Therapy/Group: Individual Therapy  Victor Herb. Jahsir Rama, PT, DPT 04/24/2014, 4:01 PM

## 2014-04-24 NOTE — Progress Notes (Signed)
Social Work Patient ID: Victor [name], male   DOB: 08-15-1990, 23 y.o.   MRN: [id number]   Have reviewed team conference with pt's uncle and had left message for his mother, however, have never received return call.  Continue to work with administration to try and secure SNF bed.  Have been alerted that I may receive offer today from  [facility] facility.  Will keep staff posted.  Bonifacio Pruden, LCSW

## 2014-04-24 NOTE — Progress Notes (Signed)
Speech Language Pathology Daily Session Note  Patient Details  Name: Victor [name] MRN: [id number] Date of Birth: Dec 05, 1990  Today's Date: 04/24/2014 SLP Individual Time: 1000-1100 SLP Individual Time Calculation (min): 60 min  Short Term Goals: Week 4: SLP Short Term Goal 1 (Week 4): Patient will utilize swallowing compensatory strategies with Min A multimodal cueing with current diet to minimize overt s/s of aspiration. SLP Short Term Goal 2 (Week 4): Patient with orient to place and situation with Mod A multimodal cues. SLP Short Term Goal 3 (Week 4): Patient will verbalize 1 physical and 1 cognitive deficit with Mod A question cues. SLP Short Term Goal 4 (Week 4): Patient will attend to basic self-care tasks for 15 minutes with Supervision cues for redirections. SLP Short Term Goal 5 (Week 4): Patient will initate set-up with meals/snacks with Min A multimodal cues.  SLP Short Term Goal 6 (Week 4): Patient will consume trials of thin liquids with minimal overt s/s of aspiration with Min A multimodal cues to utilize swallowing compensatory strategy of small sips.  Skilled Therapeutic Interventions: Skilled treatment session focused on dysphagia and cognitive-linguistic goals. Patient received from the RN station and required Mod A verbal cues for participation. Patient consumed snack of Dys. 1 textures with nectar-thick liquids without overt s/s of aspiration but utilized multiple swallows with large bites and sips via cup despite Min A verbal cues for use of compensatory swallowing strategies of small bites and sips. Patient demonstrated verbal agitation with increased cueing during snack and required Max A multimodal cues for redirection, therefore, student chose a task that the patient was interested in to increase participation. Student facilitated session by providing supervision multimodal cues for sustained attention to task and Min A multimodal cues for self-correcting and self-monitoring of  suite and number identification during a basic card game. Patient was independently oriented to place but required Min A question cues for orientation to date, age and situation. Patient handed off to PT. Continue with current plan of care.   FIM:  Comprehension Comprehension Mode: Auditory Comprehension: 3-Understands basic 50 - 74% of the time/requires cueing 25 - 50%  of the time Expression Expression Mode: Verbal Expression: 3-Expresses basic 50 - 74% of the time/requires cueing 25 - 50% of the time. Needs to repeat parts of sentences. Social Interaction Social Interaction: 2-Interacts appropriately 25 - 49% of time - Needs frequent redirection. Problem Solving Problem Solving: 2-Solves basic 25 - 49% of the time - needs direction more than half the time to initiate, plan or complete simple activities Memory Memory: 1-Recognizes or recalls less than 25% of the time/requires cueing greater than 75% of the time FIM - Eating Eating Activity: 5: Needs verbal cues/supervision;4: Help with managing cup/glass  Pain Pain Assessment Pain Assessment: No/denies pain Pain Score: 0-No pain  Therapy/Group: Individual Therapy  Saragrace Selke 04/24/2014, 12:45 PM
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
## 2014-04-24 NOTE — Progress Notes (Signed)
Occupational Therapy Session Note  Patient Details  Name: Victor AngDave [name] MRN: [id number] Date of Birth: 1991/05/11  Today's Date: 04/24/2014 OT Individual Time: [id number] and 1330-1400 OT Individual Time Calculation (min): 60 min and 30 min     Short Term Goals: Week 4:  OT Short Term Goal 1 (Week 4): Patient will complete 2 grooming tasks in standing with min assist for balance OT Short Term Goal 2 (Week 4): Pt will consistently be oriented X 3 with min verbal cues OT Short Term Goal 3 (Week 4): Pt will complete toilet transfer with mod A OT Short Term Goal 4 (Week 4): Pt will perfom shower transfer with mod A  Skilled Therapeutic Interventions/Progress Updates:    Session 1: Pt seen for ADL retraining with focus on functional transfers, standing balance, activity tolerance, and cognitive remediation. Pt received supine in bed. Completed stand pivot transfer with max assist d/t posterior lean and impulsivity. Engaged in bathing at sink this AM as pt declining shower. Pt completed bathing with min cues for organization and min assist for buttocks hygiene. Pt with 3 incontinent episodes during B&D, requiring total assist for hygiene. Pt stood up to 5 min with min-CGA assist during hygiene. Pt threaded BLE into undergarments however declined assisting with managing around waist. Donned shoes with laces fastened and min cues for fixing heel of shoe. Pt required min cues during session to locate self-care items on left side of sink. Pt demonstrated poor frustration tolerance this AM, however easily redirected. Pt oriented x4 with min cues Pt perseverating throughout session on eating and buying a house. Pt with poor awareness during therapeutic conversation about buying home. Pt left sitting in w/c at nurses station.   Session 2: Pt seen for 1:1 OT session with focus on functional transfers, standing balance, orientation, and BUE coordination. Pt received sitting in w/c oriented x4 with supervision cues.  Completed stand pivot transfer w/c>toilet with mod assist and use of grab bars then required max assist transferring back to w/c. Pt with incontinent episode, requiring total assist for hygiene and clothing management. Completed walk-in shower transfer with min assist w/c>TTB and mod assist TTB>w/c. Pt required min verbal and tactile cues for positioning of BLEs for transfers. Pt perseverating on group home following transfers, requiring max cues for redirection. Pt began to have poor frustration tolerance and declining any participation. Provided pt with short rest break then very willing to propel self in w/c to nurses station. Pt with improved initiation of use of LUE and coordination during task. Pt left sitting in w/c at nurses station with all needs in reach.   Therapy Documentation Precautions:  Precautions Precautions: Fall Precaution Comments: abdominal binder over peg, R crani in abdomen, agitation at times Required Braces or Orthoses: Other Brace/Splint Cervical Brace: Other (comment) Other Brace/Splint: Cervical Collar D/C'ed Restrictions Weight Bearing Restrictions: No Other Position/Activity Restrictions: WBAT LLE General:   Vital Signs:   Pain: Pain Assessment Pain Assessment: No/denies pain   See FIM for current functional status  Therapy/Group: Individual Therapy  Victor [name], Victor [name] 04/24/2014, 9:32 AM

## 2014-04-24 NOTE — Plan of Care (Signed)
Problem: RH BOWEL ELIMINATION Goal: RH STG MANAGE BOWEL WITH ASSISTANCE STG Manage Bowel with total Assistance.  Outcome: Not Met (add Reason) SNF placement-pt incont. Of bowel with no awareness of having an episode. No pattern or timed tolieting effective
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
## 2014-04-24 NOTE — Progress Notes (Signed)
Social Work Patient ID: Victor [name], male   DOB: 19-Mar-1991, 23 y.o.   MRN: [id number]  Have secured a SNF bed for pt tomorrow with [facility].  Coordinating ambulance transportation to facility in the morning.  Have informed pt and his uncle of plan to transfer - both agreeable.  Need to assist with completion of admission paperwork as uncle unable to get to facility to do this on site.  Treatment team aware of plans.  Kellyanne Ellwanger, LCSW

## 2014-04-25 DIAGNOSIS — R1314 Dysphagia, pharyngoesophageal phase: Secondary | ICD-10-CM

## 2014-04-25 NOTE — Plan of Care (Signed)
Problem: RH Balance Goal: LTG Patient will maintain dynamic standing with ADLs (OT) LTG: Patient will maintain dynamic standing balance with assist during activities of daily living (OT)  Outcome: Not Met (add Reason) Pt requires min-total assist for dynamic standing balance d/t posterior lean and decreased postural control  Problem: RH Toileting Goal: LTG Patient will perform toileting w/assist, cues/equip (OT) LTG: Patient will perform toiletiing (clothes management/hygiene) with assist, with/without cues using equipment (OT)  Outcome: Not Met (add Reason) Patient requires total assist to complete toilet task  Problem: RH Toilet Transfers Goal: LTG Patient will perform toilet transfers w/assist (OT) LTG: Patient will perform toilet transfers with assist, with/without cues using equipment (OT)  Outcome: Not Met (add Reason) Patient requires max-min assist to complete transfers  Problem: RH Memory Goal: LTG Patient will demonstrate ability for day to day (OT) LTG: Patient will demonstrate ability for day to day recall/carryover during activities of daily living with assist (OT)  Patient requires total assist for recall/memory  Problem: RH Awareness Goal: LTG: Patient will demonstrate intellectual/emergent (OT) LTG: Patient will demonstrate intellectual/emergent/anticipatory awareness with assist during a functional activity (OT)  Outcome: Not Met (add Reason) Patient requires min-mod cues for emergent awareness

## 2014-04-25 NOTE — Progress Notes (Signed)
Speech Language Pathology Discharge Summary  Patient Details  Name: Victor [name] MRN: [id number] Date of Birth: 08-20-1990   Patient has met 7 of 9 long term goals.  Patient to discharge at overall Max level.   Reasons goals not met: Patient continues to require overall Max-Total A for working memory and prolem solving with basic and familiar tasks.    Clinical Impression/Discharge Summary: Patient has made functional gains and has met 7 of 9 LTG's this admission due to increased orientation, attention, verbal expression and swallowing function.  Patient is currently decannulated and demonstrates appropriate vocal intensity for functional communication. Patient can express his wants/needs but can often be perseverative with his thoughts with intermittent language of confusion. Patient is consuming Dys. 1 textures with nectar-thick liquids via cup with full supervision and Min A multimodal cues for utilization of swallowing compensatory strategies. Patient had repeat MBS on 04/23/14 which showed silent penetration of thin liquids via cup and silent aspiration via straw, however, suspect patient may be able to tolerate upgrade once impulsivity decreases due to patient's overall functional reserve in regards to age, respiratory status, etc. Patient continues to demonstrate behaviors consistent with a Rancho Level V and requires Min A multimodal cues for orientation and attention, Mod A for intellectual awareness and Max-total A for working memory, problem solving and safety awareness. Patient's family is unable to provide the necessary physical and cognitive function, therefore, skilled patient will discharge to a SNF. Patient would benefit from f/u SLP services to maximize his swallowing and cognitive-linguistic function in order to maximize his functional independence and reduce caregiver burden.   Care Partner:  Caregiver Able to Provide Assistance: No  Type of Caregiver Assistance:  Physical;Cognitive  Recommendation:  Skilled Nursing facility;24 hour supervision/assistance  Rationale for SLP Follow Up: Maximize cognitive function and independence;Reduce caregiver burden;Maximize swallowing safety   Equipment: Thickener    Reasons for discharge: Discharged from hospital   Patient/Family Agrees with Progress Made and Goals Achieved: Yes   See FIM for current functional status  [name] 04/25/2014, 9:38 AM

## 2014-04-25 NOTE — Progress Notes (Signed)
Social Work  Discharge Note  The overall goal for the admission was met for:   Discharge location: No - plan changed to SNF as uncle unable to meet care needs  Length of Stay: No - difficult bed search due to multiple issues - LOS = 30 days  Discharge activity level: Yes - min/mod assist overall  Home/community participation: No  Services provided included: MD, RD, PT, OT, SLP, RN, TR, Pharmacy and SW  Financial Services: Medicaid  Follow-up services arranged: Other: SNF at [facility]  Comments (or additional information):  Patient/Family verbalized understanding of follow-up arrangements: Yes  Individual responsible for coordination of the follow-up plan: pt's uncle  Confirmed correct DME delivered: NA    Nychelle Cassata

## 2014-04-25 NOTE — Progress Notes (Signed)
Occupational Therapy Discharge Summary  Patient Details  Name: Victor [name] MRN: [id number] Date of Birth: 01-23-1991   Patient has met 9 of 49 long term goals due to improved activity tolerance, improved balance, postural control, ability to compensate for deficits, functional use of  LEFT upper and LEFT lower extremity, improved attention, improved awareness and improved coordination.  Patient to discharge at overall min-max assist functional transfers and self-care tasks and total assist toileting task level.  Patient's care partner is independent to provide the necessary physical and cognitive assistance at discharge as patient discharging to SNF.    Reasons goals not met: Patient requires min-max assist functional transfers and dynamic standing balance due impaired postural control, decreased awareness, and posterior lean. Patient requires total assist for toileting task due decreased standing balance and coordination. Patient requires max-total A for memory and recall. Patient requires min-mod assist for emergent awareness during functional mobility and functional tasks.   Recommendation:  Patient will benefit from ongoing skilled OT services in skilled nursing facility setting to continue to advance functional skills in the area of BADLs, balance, coordination, strength, activity tolerance, and cognitive remediation.  Equipment: No equipment provided  Reasons for discharge: lack of progress toward goals and discharge from hospital  Patient/family agrees with progress made and goals achieved: Yes  OT Discharge Precautions/Restrictions  Precautions Precautions: Fall Precaution Comments: abdominal binder over peg, R crani in abdomen, agitation at times Restrictions Weight Bearing Restrictions: No Other Position/Activity Restrictions: WBAT LLE General   Vital Signs Therapy Vitals Temp: 98 F (36.7 C) Temp Source: Oral Pulse Rate: 61 Resp: 18 BP: 150/87 mmHg Patient Position (if  appropriate): Lying Oxygen Therapy SpO2: 100 % O2 Device: None (Room air) Pain  No report of pain ADL   Vision/Perception  Vision- History Baseline Vision/History: Wears glasses Wears Glasses: At all times Patient Visual Report: Other (comment) (pt inconistent with report; difficult to determine dt cognitive linguistic deficits) Vision- Assessment Vision Assessment?: Vision impaired- to be further tested in functional context Additional Comments: Diffiult to formally assess d/t cognitive impairments; pt inconsistent with ability read items and locate items; tends to bring things very close to face to read and unable to determine baseline vision Perception Comments: decreased attention to left however has improved since evaluation  Cognition Overall Cognitive Status: Impaired/Different from baseline Arousal/Alertness: Awake/alert Orientation Level: Oriented to person;Oriented to place;Disoriented to time Attention: Selective Sustained Attention: Impaired Sustained Attention Impairment: Verbal basic;Functional basic Selective Attention: Impaired Selective Attention Impairment: Verbal basic;Functional basic Memory: Impaired Memory Impairment: Storage deficit;Decreased short term memory;Decreased recall of new information;Retrieval deficit Decreased Short Term Memory: Verbal basic;Functional basic Awareness: Impaired Awareness Impairment: Intellectual impairment Problem Solving: Impaired Problem Solving Impairment: Verbal basic;Functional basic Executive Function:  (all impaired) Behaviors: Perseveration;Verbal agitation;Impulsive;Poor frustration tolerance Safety/Judgment: Impaired Rancho [employer] Scales of Cognitive Functioning: Confused/inappropriate/non-agitated Sensation Sensation Light Touch: Impaired by gross assessment Proprioception: Impaired by gross assessment Additional Comments: difficult to formally assess due to cognitive deficits Coordination Gross Motor  Movements are Fluid and Coordinated: No Fine Motor Movements are Fluid and Coordinated: No Motor  Motor Motor: Motor apraxia;Abnormal postural alignment and control;Hemiplegia;Abnormal tone Motor - Skilled Clinical Observations: mild hemiparesis L side Mobility  Bed Mobility Bed Mobility: Supine to Sit Supine to Sit: 5: Supervision Supine to Sit Details: Tactile cues for sequencing;Tactile cues for initiation;Verbal cues for sequencing;Verbal cues for technique Transfers Transfers: Sit to Stand;Stand to Sit Sit to Stand: 4: Min assist;3: Mod assist Sit to Stand Details: Manual facilitation for weight shifting;Manual  facilitation for placement;Tactile cues for initiation;Verbal cues for sequencing;Verbal cues for technique Stand to Sit: 4: Min assist;3: Mod assist Stand to Sit Details (indicate cue type and reason): Verbal cues for sequencing;Manual facilitation for weight shifting;Manual facilitation for placement;Verbal cues for technique;Tactile cues for initiation  Trunk/Postural Assessment  Cervical Assessment Cervical Assessment: Within Functional Limits Thoracic Assessment Thoracic Assessment: Within Functional Limits Lumbar Assessment Lumbar Assessment: Within Functional Limits  Balance Static Sitting Balance Static Sitting - Balance Support: Bilateral upper extremity supported;Feet supported Static Sitting - Level of Assistance: 5: Stand by assistance Static Standing Balance Static Standing - Level of Assistance:  (patient flucuates min-max assist standing balance) Extremity/Trunk Assessment RUE Assessment RUE Assessment: Within Functional Limits LUE Assessment LUE Assessment: Exceptions to WFL LUE AROM (degrees) LUE Overall AROM Comments: shoulder flexion grossly 100*; full elbow flexion/extension and digit ROM; decresaed coordination d/t apraxia  See FIM for current functional status  Aneesh Faller N 04/25/2014, 7:45 AM
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
## 2014-04-26 NOTE — Progress Notes (Signed)
Physical Therapy Discharge Summary  Patient Details  Name: Victor [name] MRN: [id number] Date of Birth: 06-23-1991  Today's Date: 04/26/2014  Patient has met 3 of 11 long term goals due to improved activity tolerance, improved balance, improved postural control, increased strength, ability to compensate for deficits, functional use of  left upper extremity and left lower extremity, improved attention, improved awareness and improved coordination.  Patient to discharge at a wheelchair level supervision to [place] [facility] +2.   Patient's care partner not necessary to complete family education/training secondary to patient discharging to SNF to provide the necessary physical and cognitive assistance at discharge.  Reasons goals not met: Cognitive-linguistic deficits, poor carry-over, decreased motor control, impulsivity, poor safety awareness.  Recommendation:  Patient will benefit from ongoing skilled PT services in skilled nursing facility setting to continue to advance safe functional mobility, address ongoing impairments in motor control, balance, postural control, coordination, cognition, functional mobility, and minimize fall risk.  Equipment: No equipment provided  Reasons for discharge: treatment goals met and discharge from hospital  Patient/family agrees with progress made and goals achieved: Yes  PT Discharge Precautions/Restrictions Precautions Precautions: Fall Precaution Comments: abdominal binder over peg, R crani in abdomen, agitation at times Required Braces or Orthoses: Other Brace/Splint Cervical Brace: Other (comment) Other Brace/Splint: Cervical Collar D/C'ed Restrictions Weight Bearing Restrictions: No Other Position/Activity Restrictions: WBAT LLE Vision/Perception  Vision - Assessment Additional Comments: Difficult to formally assess due to cognitive impairments  Cognition Overall Cognitive Status: Impaired/Different from baseline Arousal/Alertness:  Awake/alert Orientation Level: Oriented to person;Oriented to place;Disoriented to time;Oriented to situation Attention: Selective Sustained Attention: Impaired Sustained Attention Impairment: Verbal basic;Functional basic Selective Attention: Impaired Selective Attention Impairment: Verbal basic;Functional basic Memory: Impaired Memory Impairment: Storage deficit;Decreased short term memory;Decreased recall of new information;Retrieval deficit Decreased Short Term Memory: Verbal basic;Functional basic Awareness: Impaired Awareness Impairment: Intellectual impairment Problem Solving: Impaired Problem Solving Impairment: Verbal basic;Functional basic Executive Function:  (all impaired due to lower level deficits) Behaviors: Perseveration;Verbal agitation;Impulsive;Poor frustration tolerance Safety/Judgment: Impaired [employer] Scales of Cognitive Functioning: Confused/inappropriate/non-agitated Sensation Sensation Light Touch: Impaired by gross assessment Proprioception: Impaired by gross assessment Additional Comments: difficult to formally assess due to cognitive deficits Coordination Gross Motor Movements are Fluid and Coordinated: No Fine Motor Movements are Fluid and Coordinated: No Motor  Motor Motor: Motor apraxia;Abnormal postural alignment and control;Hemiplegia;Abnormal tone Motor - Discharge Observations: mild hemiparesis L side  Mobility Bed Mobility Bed Mobility: Supine to Sit;Sit to Supine Supine to Sit: HOB flat;5: Supervision Supine to Sit Details: Verbal cues for precautions/safety Sit to Supine: 5: Supervision;HOB flat Sit to Supine - Details: Verbal cues for precautions/safety Transfers Transfers: Yes Sit to Stand: 4: Min assist;3: Mod assist Sit to Stand Details: Manual facilitation for weight shifting;Manual facilitation for placement;Tactile cues for initiation;Verbal cues for sequencing;Verbal cues for technique;Verbal cues for  precautions/safety Stand to Sit: 4: Min assist;3: Mod assist Stand to Sit Details (indicate cue type and reason): Verbal cues for sequencing;Manual facilitation for weight shifting;Manual facilitation for placement;Verbal cues for technique;Tactile cues for initiation;Verbal cues for precautions/safety Stand Pivot Transfers: 2: Max assist;1: +2 Total assist Stand Pivot Transfer Details: Tactile cues for initiation;Verbal cues for technique;Manual facilitation for weight shifting;Manual facilitation for placement;Verbal cues for precautions/safety;Verbal cues for sequencing Locomotion  Ambulation Ambulation: Yes Ambulation/Gait Assistance: 1: +2 Total assist Ambulation Distance (Feet): 175 Feet Assistive device: 2 person hand held assist Ambulation/Gait Assistance Details: Manual facilitation for weight bearing;Manual facilitation for placement;Manual facilitation for weight shifting;Tactile cues for posture;Verbal cues for sequencing;Tactile cues  for initiation Gait Gait: Yes Gait Pattern: Impaired Gait Pattern: Decreased step length - right;Decreased step length - left;Decreased stance time - right;Decreased stride length;Decreased dorsiflexion - right;Decreased dorsiflexion - left;Decreased weight shift to right;Trunk flexed;Narrow base of support;Step-through pattern;Left steppage;Right steppage Stairs / Additional Locomotion Stairs: Yes Stairs Assistance: 1: +2 Total assist Stairs Assistance Details: Tactile cues for initiation;Verbal cues for precautions/safety;Verbal cues for sequencing;Tactile cues for sequencing;Tactile cues for posture;Manual facilitation for weight shifting;Manual facilitation for placement;Visual cues/gestures for sequencing;Tactile cues for placement;Verbal cues for technique Stairs Assistance Details (indicate cue type and reason): Ascends forwards, descends backwards Stair Management Technique: Two rails;Step to pattern;Backwards;Forwards Number of Stairs:  6 Height of Stairs: 5 Wheelchair Mobility Wheelchair Mobility: Yes Wheelchair Assistance: 5: Supervision Wheelchair Assistance Details: Verbal cues for precautions/safety;Verbal cues for safe use of DME/AE;Visual cues/gestures for Information systems manager: Both lower extermities;Both upper extremities Wheelchair Parts Management: Needs assistance Distance: 150  Trunk/Postural Assessment  Cervical Assessment Cervical Assessment: Within Functional Limits Thoracic Assessment Thoracic Assessment: Within Functional Limits Lumbar Assessment Lumbar Assessment: Within Functional Limits Postural Control Postural Control: Deficits on evaluation Righting Reactions: delayed Protective Responses: delayed Postural Limitations: posterior lean/extensor tone during movement  Balance Balance Balance Assessed: Yes Static Sitting Balance Static Sitting - Balance Support: Bilateral upper extremity supported;Feet supported;No upper extremity supported;Feet unsupported Static Sitting - Level of Assistance: 5: Stand by assistance Dynamic Sitting Balance Dynamic Sitting - Balance Support: Feet supported;No upper extremity supported;Feet unsupported;Bilateral upper extremity supported Dynamic Sitting - Level of Assistance: 5: Stand by assistance Static Standing Balance Static Standing - Balance Support: Bilateral upper extremity supported;During functional activity Static Standing - Level of Assistance: 3: Mod assist;1: +2 Total assist Extremity Assessment  RLE Assessment RLE Assessment: Exceptions to [facility] RLE Strength RLE Overall Strength: Deficits RLE Overall Strength Comments: Grossly 2+ to 3/5 strength; decreased strength in DF LLE Assessment LLE Assessment: Exceptions to [facility] LLE Strength LLE Overall Strength: Deficits LLE Overall Strength Comments: Grossly 2+ to 3/5 strength; decreased strength in DF  See FIM for current functional status  Jadyn Brasher S Afiya Ferrebee S. Abdul Beirne, PT,  DPT 04/26/2014, 1:22 PM

## 2014-04-26 NOTE — Plan of Care (Signed)
Problem: RH Balance Goal: LTG Patient will maintain dynamic standing balance (PT) LTG: Patient will maintain dynamic standing balance with assistance during mobility activities (PT)  Outcome: Not Met (add Reason) Patient continues to require mod-totalA secondary to impulsivity and trunk extension during mobility.  Problem: RH Bed to Chair Transfers Goal: LTG Patient will perform bed/chair transfers w/assist (PT) LTG: Patient will perform bed/chair transfers with assistance, with/without cues (PT).  Outcome: Not Met (add Reason) Patient continues to requires min-maxA for transfers secondary to impulsivity, poor safety awareness, and decreaed motor control.  Problem: RH Car Transfers Goal: LTG Patient will perform car transfers with assist (PT) LTG: Patient will perform car transfers with assistance (PT).  Outcome: Not Applicable Date Met:  46/50/35 Patient to discharge to SNF via ambulance; car transfer goal not applicable.  Problem: RH Furniture Transfers Goal: LTG Patient will perform furniture transfers w/assist (OT/PT LTG: Patient will perform furniture transfers with assistance (OT/PT).  Outcome: Not Met (add Reason) Patient continues to requires min-maxA for transfers secondary to impulsivity, poor safety awareness, and decreaed motor control.  Problem: RH Floor Transfers Goal: LTG Patient will perform floor transfers w/assist (PT) LTG: Patient will perform floor transfers with assistance (PT).  Outcome: Not Met (add Reason) Unable to attempt secondary to patient impulsivity and increased agitation with attempts.  Problem: RH Ambulation Goal: LTG Patient will ambulate in controlled environment (PT) LTG: Patient will ambulate in a controlled environment, # of feet with assistance (PT).  Outcome: Not Met (add Reason) Patient continues to requires maxA of one or modA of two for gait secondary to impulsivity, poor safety awareness, and decreaed motor control. Goal: LTG Patient  will ambulate in home environment (PT) LTG: Patient will ambulate in home environment, # of feet with assistance (PT).  Outcome: Not Met (add Reason) Patient continues to requires maxA of one or modA of two for gait secondary to impulsivity, poor safety awareness, and decreaed motor control.  Problem: RH Stairs Goal: LTG Patient will ambulate up and down stairs w/assist (PT) LTG: Patient will ambulate up and down # of stairs with assistance (PT)  Outcome: Not Met (add Reason) Patient continues to requires two helpers for stair negotiation secondary to impulsivity, poor safety awareness, and decreased motor control.  Problem: RH Memory Goal: LTG Patient demonstrate ability for day to day recall (PT) LTG: Patient will demonstrate ability for day to day recall/carryover during mobility activities with assist (PT)  Outcome: Not Met (add Reason) Patient continues to require max-totalA for recall with mobility activities.

## 2014-04-28 ENCOUNTER — Encounter (HOSPITAL_COMMUNITY): Payer: Self-pay

## 2014-04-28 ENCOUNTER — Ambulatory Visit (HOSPITAL_COMMUNITY): Admit: 2014-04-28 | Payer: No Typology Code available for payment source | Admitting: Oral and Maxillofacial Surgery

## 2014-04-28 SURGERY — CLOSED REDUCTION, MANDIBLE, WITH ARCH BAR APPLICATION AND INTERMAXILLARY FIXATION
Anesthesia: General

## 2014-05-09 ENCOUNTER — Encounter: Payer: Self-pay | Admitting: Family

## 2014-05-12 ENCOUNTER — Encounter (HOSPITAL_COMMUNITY): Payer: Medicaid Other

## 2014-05-12 ENCOUNTER — Ambulatory Visit: Payer: Medicaid Other | Admitting: Family

## 2014-05-12 ENCOUNTER — Other Ambulatory Visit (HOSPITAL_COMMUNITY): Payer: Medicaid Other
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
## 2014-06-02 ENCOUNTER — Encounter (HOSPITAL_COMMUNITY): Payer: Self-pay

## 2014-06-02 NOTE — Pre-Procedure Instructions (Addendum)
Victor [name]  06/02/2014   Your procedure is scheduled on:  Wednesday, November 11.  Report to [facility] Admitting at 6:30AM.  Call this number if you have problems the morning of surgery: [phone]   Remember:   Do not eat food or drink liquids after midnight.    Take these medicines the morning of surgery with A SIP OF WATER: baclofen (LIORESAL), escitalopram (LEXAPRO), levETIRAcetam (KEPPRA), propranolol (INDERAL), QUEtiapine (SEROQUEL), Valproic Acid (DEPAKENE).                If needed: Ativan.    Do not wear jewelry, make-up or nail polish.  Do not wear lotions, powders, or perfumes.           Men may shave face and neck  Do not bring valuables to the hospital.             [facility] is not responsible for any belongings or valuables.               Contacts, dentures or bridgework may not be worn into surgery.  Leave suitcase in the car. After surgery it may be brought to your room.  For patients admitted to the hospital, discharge time is determined by your treatment team.               Patients discharged the day of surgery will not be allowed to drive home.  Name and phone number of your driver: -   Special Instructions: Review  [facility] - Preparing For Surgery.   Please read over the following fact sheets that you were given: Pain Booklet, Coughing and Deep Breathing and Surgical Site Infection Prevention

## 2014-06-03 ENCOUNTER — Encounter (HOSPITAL_COMMUNITY)
Admission: RE | Admit: 2014-06-03 | Discharge: 2014-06-03 | Disposition: A | Discharge status: Home / Self Care | Payer: Medicaid Other | Source: Ambulatory Visit | Attending: Oral and Maxillofacial Surgery | Admitting: Oral and Maxillofacial Surgery

## 2014-06-03 ENCOUNTER — Other Ambulatory Visit: Payer: Self-pay | Admitting: Neurosurgery

## 2014-06-03 ENCOUNTER — Encounter (HOSPITAL_COMMUNITY): Payer: Self-pay

## 2014-06-03 DIAGNOSIS — Z481 Encounter for planned postprocedural wound closure: Secondary | ICD-10-CM | POA: Diagnosis not present

## 2014-06-03 DIAGNOSIS — Z01812 Encounter for preprocedural laboratory examination: Secondary | ICD-10-CM | POA: Diagnosis not present

## 2014-06-03 HISTORY — DX: Unspecified asthma, uncomplicated: J45.909

## 2014-06-03 HISTORY — DX: Traumatic subdural hemorrhage with loss of consciousness of unspecified duration, initial encounter: S06.5X9A

## 2014-06-03 LAB — SURGICAL PCR SCREEN
MRSA, PCR: NEGATIVE
STAPHYLOCOCCUS AUREUS: NEGATIVE

## 2014-06-03 LAB — CBC
HCT: 46.6 % (ref 39.0–52.0)
Hemoglobin: 15.4 g/dL (ref 13.0–17.0)
MCH: 24.9 pg — ABNORMAL LOW (ref 26.0–34.0)
MCHC: 33 g/dL (ref 30.0–36.0)
MCV: 75.4 fL — ABNORMAL LOW (ref 78.0–100.0)
PLATELETS: 165 10*3/uL (ref 150–400)
RBC: 6.18 MIL/uL — ABNORMAL HIGH (ref 4.22–5.81)
RDW: 15.8 % — AB (ref 11.5–15.5)
WBC: 5.2 10*3/uL (ref 4.0–10.5)
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
## 2014-06-04 ENCOUNTER — Encounter (HOSPITAL_COMMUNITY): Payer: Self-pay | Admitting: Certified Registered Nurse Anesthetist

## 2014-06-04 ENCOUNTER — Encounter (HOSPITAL_COMMUNITY)
Admission: RE | Disposition: A | Discharge status: Home / Self Care | Payer: Self-pay | Source: Ambulatory Visit | Attending: Oral and Maxillofacial Surgery

## 2014-06-04 ENCOUNTER — Ambulatory Visit (HOSPITAL_COMMUNITY)
Admission: RE | Admit: 2014-06-04 | Discharge: 2014-06-04 | Disposition: A | Discharge status: Home / Self Care | Payer: Medicaid Other | Source: Ambulatory Visit | Attending: Oral and Maxillofacial Surgery | Admitting: Oral and Maxillofacial Surgery

## 2014-06-04 ENCOUNTER — Ambulatory Visit (HOSPITAL_COMMUNITY): Payer: Medicaid Other | Admitting: Certified Registered Nurse Anesthetist

## 2014-06-04 ENCOUNTER — Ambulatory Visit (HOSPITAL_BASED_OUTPATIENT_CLINIC_OR_DEPARTMENT_OTHER)
Admission: RE | Admit: 2014-06-04 | Payer: Medicaid Other | Source: Ambulatory Visit | Admitting: Oral and Maxillofacial Surgery

## 2014-06-04 ENCOUNTER — Ambulatory Visit: Payer: Self-pay | Admitting: Physical Medicine & Rehabilitation

## 2014-06-04 ENCOUNTER — Encounter (HOSPITAL_BASED_OUTPATIENT_CLINIC_OR_DEPARTMENT_OTHER): Admission: RE | Payer: Self-pay | Source: Ambulatory Visit

## 2014-06-04 DIAGNOSIS — I10 Essential (primary) hypertension: Secondary | ICD-10-CM | POA: Diagnosis not present

## 2014-06-04 DIAGNOSIS — Z87820 Personal history of traumatic brain injury: Secondary | ICD-10-CM | POA: Insufficient documentation

## 2014-06-04 DIAGNOSIS — Y9289 Other specified places as the place of occurrence of the external cause: Secondary | ICD-10-CM | POA: Insufficient documentation

## 2014-06-04 DIAGNOSIS — S0266XD Fracture of symphysis of mandible, subsequent encounter for fracture with routine healing: Secondary | ICD-10-CM | POA: Insufficient documentation

## 2014-06-04 DIAGNOSIS — S0262XD Fracture of subcondylar process of mandible, subsequent encounter for fracture with routine healing: Secondary | ICD-10-CM | POA: Diagnosis present

## 2014-06-04 DIAGNOSIS — E119 Type 2 diabetes mellitus without complications: Secondary | ICD-10-CM | POA: Insufficient documentation

## 2014-06-04 DIAGNOSIS — Y9389 Activity, other specified: Secondary | ICD-10-CM | POA: Insufficient documentation

## 2014-06-04 DIAGNOSIS — Y998 Other external cause status: Secondary | ICD-10-CM | POA: Insufficient documentation

## 2014-06-04 DIAGNOSIS — J45909 Unspecified asthma, uncomplicated: Secondary | ICD-10-CM | POA: Insufficient documentation

## 2014-06-04 DIAGNOSIS — H409 Unspecified glaucoma: Secondary | ICD-10-CM | POA: Insufficient documentation

## 2014-06-04 HISTORY — PX: MANDIBULAR HARDWARE REMOVAL: SHX5205

## 2014-06-04 LAB — GLUCOSE, CAPILLARY
GLUCOSE-CAPILLARY: 85 mg/dL (ref 70–99)
Glucose-Capillary: 98 mg/dL (ref 70–99)

## 2014-06-04 LAB — BASIC METABOLIC PANEL
ANION GAP: 13 (ref 5–15)
BUN: 12 mg/dL (ref 6–23)
CALCIUM: 9.2 mg/dL (ref 8.4–10.5)
CO2: 24 mEq/L (ref 19–32)
Chloride: 106 mEq/L (ref 96–112)
Creatinine, Ser: 0.81 mg/dL (ref 0.50–1.35)
GFR calc Af Amer: >90 mL/min (ref >90)
GFR calc non Af Amer: >90 mL/min (ref >90)
Glucose, Bld: 82 mg/dL (ref 70–99)
Potassium: 4.6 mEq/L (ref 3.7–5.3)
SODIUM: 143 meq/L (ref 137–147)

## 2014-06-04 SURGERY — REMOVAL, HARDWARE, MANDIBLE
Anesthesia: General | Site: Mouth

## 2014-06-04 SURGERY — REMOVAL, HARDWARE, MANDIBLE
Anesthesia: General

## 2014-06-04 MED ORDER — BUPIVACAINE-EPINEPHRINE (PF) 0.5% -1:200000 IJ SOLN
INTRAMUSCULAR | Status: AC
  Filled 2014-06-04: qty 30

## 2014-06-04 MED ORDER — CEFAZOLIN SODIUM-DEXTROSE 2-3 GM-% IV SOLR
2.0000 g | INTRAVENOUS | Status: AC
  Administered 2014-06-04: 2 g via INTRAVENOUS

## 2014-06-04 MED ORDER — FENTANYL CITRATE 0.05 MG/ML IJ SOLN: 25.0000 ug | INTRAMUSCULAR | Status: DC | PRN

## 2014-06-04 MED ORDER — LIDOCAINE HCL (CARDIAC) 20 MG/ML IV SOLN
INTRAVENOUS | Status: DC | PRN
  Administered 2014-06-04: 40 mg via INTRAVENOUS

## 2014-06-04 MED ORDER — LACTATED RINGERS IV SOLN
INTRAVENOUS | Status: DC | PRN
  Administered 2014-06-04: 09:00:00 via INTRAVENOUS

## 2014-06-04 MED ORDER — BUPIVACAINE-EPINEPHRINE 0.5% -1:200000 IJ SOLN
INTRAMUSCULAR | Status: DC | PRN
  Administered 2014-06-04: 5 mL

## 2014-06-04 MED ORDER — LIDOCAINE-EPINEPHRINE 1 %-1:100000 IJ SOLN
INTRAMUSCULAR | Status: AC
  Filled 2014-06-04: qty 1

## 2014-06-04 MED ORDER — FENTANYL CITRATE 0.05 MG/ML IJ SOLN
INTRAMUSCULAR | Status: DC | PRN
  Administered 2014-06-04: 50 ug via INTRAVENOUS

## 2014-06-04 MED ORDER — 0.9 % SODIUM CHLORIDE (POUR BTL) OPTIME
TOPICAL | Status: DC | PRN
  Administered 2014-06-04: 1000 mL

## 2014-06-04 MED ORDER — PROMETHAZINE HCL 25 MG/ML IJ SOLN: 6.2500 mg | INTRAMUSCULAR | Status: DC | PRN

## 2014-06-04 MED ORDER — ONDANSETRON HCL 4 MG/2ML IJ SOLN
INTRAMUSCULAR | Status: DC | PRN
  Administered 2014-06-04: 4 mg via INTRAVENOUS

## 2014-06-04 MED ORDER — OXYMETAZOLINE HCL 0.05 % NA SOLN
NASAL | Status: AC
  Filled 2014-06-04: qty 15

## 2014-06-04 MED ORDER — PROPOFOL 10 MG/ML IV BOLUS
INTRAVENOUS | Status: DC | PRN
  Administered 2014-06-04: 170 mg via INTRAVENOUS

## 2014-06-04 MED ORDER — SUCCINYLCHOLINE CHLORIDE 20 MG/ML IJ SOLN
INTRAMUSCULAR | Status: DC | PRN
  Administered 2014-06-04: 100 mg via INTRAVENOUS

## 2014-06-04 MED ORDER — LIDOCAINE-EPINEPHRINE 1 %-1:100000 IJ SOLN
INTRAMUSCULAR | Status: DC | PRN
  Administered 2014-06-04: 10 mL

## 2014-06-04 SURGICAL SUPPLY — 56 items
ATTRACTOMAT 16X20 MAGNETIC DRP (DRAPES) IMPLANT
BAND DENTAL 1/4IN PULL MED (MISCELLANEOUS) IMPLANT
BAND DENTAL 3/16IN PULL HEAVY (MISCELLANEOUS) IMPLANT
BLADE 10 SAFETY STRL DISP (BLADE) ×2 IMPLANT
BLADE SURG 15 STRL LF DISP TIS (BLADE) ×2 IMPLANT
BLADE SURG 15 STRL SS (BLADE) ×2
BUR CROSS CUT (BURR)
BUR CROSS CUT FISSURE 1.6 (BURR) IMPLANT
BUR RND FLUTED 2.5 (BURR) IMPLANT
BUR SRG MED 1.2XXCUT FSSR (BURR) IMPLANT
BUR SRG MED 1.6XXCUT FSSR (BURR) IMPLANT
BUR SURG 4X8 MED (BURR) IMPLANT
BURR SRG MED 1.2XXCUT FSSR (BURR)
BURR SRG MED 1.6XXCUT FSSR (BURR)
BURR SURG 4X8 MED (BURR)
CANISTER SUCTION 2500CC (MISCELLANEOUS) ×2 IMPLANT
CLEANER TIP ELECTROSURG 2X2 (MISCELLANEOUS) IMPLANT
COVER SURGICAL LIGHT HANDLE (MISCELLANEOUS) ×2 IMPLANT
DRESSING TELFA 8X3 (GAUZE/BANDAGES/DRESSINGS) IMPLANT
ELECT REM PT RETURN 9FT ADLT (ELECTROSURGICAL)
ELECTRODE REM PT RTRN 9FT ADLT (ELECTROSURGICAL) IMPLANT
GAUZE PACKING FOLDED 2  STR (GAUZE/BANDAGES/DRESSINGS) ×1
GAUZE PACKING FOLDED 2 STR (GAUZE/BANDAGES/DRESSINGS) ×1 IMPLANT
GAUZE SPONGE 4X4 12PLY STRL (GAUZE/BANDAGES/DRESSINGS) IMPLANT
GLOVE BIO SURGEON STRL SZ7 (GLOVE) ×4 IMPLANT
GLOVE BIOGEL PI IND STRL 7.0 (GLOVE) IMPLANT
GLOVE BIOGEL PI IND STRL 7.5 (GLOVE) ×1 IMPLANT
GLOVE BIOGEL PI INDICATOR 7.0 (GLOVE) ×1
GLOVE BIOGEL PI INDICATOR 7.5 (GLOVE) ×1
GLOVE ORTHO TXT STRL SZ7.5 (GLOVE) ×2 IMPLANT
GOWN STRL REUS W/ TWL LRG LVL3 (GOWN DISPOSABLE) ×2 IMPLANT
GOWN STRL REUS W/TWL LRG LVL3 (GOWN DISPOSABLE) ×6
KIT BASIN OR (CUSTOM PROCEDURE TRAY) ×2 IMPLANT
KIT ROOM TURNOVER OR (KITS) ×2 IMPLANT
NDL BLUNT 16X1.5 OR ONLY (NEEDLE) ×1 IMPLANT
NDL DENTAL 27 LONG (NEEDLE) ×2 IMPLANT
NDL HYPO 25GX1X1/2 BEV (NEEDLE) IMPLANT
NEEDLE BLUNT 16X1.5 OR ONLY (NEEDLE) ×2 IMPLANT
NEEDLE DENTAL 27 LONG (NEEDLE) IMPLANT
NEEDLE HYPO 25GX1X1/2 BEV (NEEDLE) ×4 IMPLANT
NS IRRIG 1000ML POUR BTL (IV SOLUTION) ×2 IMPLANT
PAD ARMBOARD 7.5X6 YLW CONV (MISCELLANEOUS) ×6 IMPLANT
PENCIL BUTTON HOLSTER BLD 10FT (ELECTRODE) IMPLANT
SCISSORS WIRE ANG 4 3/4 DISP (INSTRUMENTS) IMPLANT
SUT CHROMIC 3 0 PS 2 (SUTURE) IMPLANT
SUT STEEL 0 (SUTURE)
SUT STEEL 0 18XMFL TIE 17 (SUTURE) ×1 IMPLANT
SUT STEEL 2 (SUTURE) ×1 IMPLANT
SYR 50ML SLIP (SYRINGE) ×2 IMPLANT
TOOTHBRUSH ADULT (PERSONAL CARE ITEMS) ×2 IMPLANT
TOWEL OR 17X24 6PK STRL BLUE (TOWEL DISPOSABLE) ×2 IMPLANT
TOWEL OR 17X26 10 PK STRL BLUE (TOWEL DISPOSABLE) ×2 IMPLANT
TRAY ENT MC OR (CUSTOM PROCEDURE TRAY) ×2 IMPLANT
TUBING IRRIGATION (MISCELLANEOUS) IMPLANT
WATER STERILE IRR 1000ML POUR (IV SOLUTION) ×2 IMPLANT
YANKAUER SUCT BULB TIP NO VENT (SUCTIONS) ×2 IMPLANT

## 2014-06-04 NOTE — Anesthesia Procedure Notes (Signed)
Procedure Name: Intubation Date/Time: 06/04/2014 9:06 AM Performed by: Margaree MackintoshYACOUB, Rudean Icenhour B Pre-anesthesia Checklist: Patient identified, Emergency Drugs available, Suction available, Patient being monitored and Timeout performed Patient Re-evaluated:Patient Re-evaluated prior to inductionOxygen Delivery Method: Circle system utilized Preoxygenation: Pre-oxygenation with 100% oxygen Intubation Type: IV induction Ventilation: Mask ventilation without difficulty Laryngoscope Size: Mac and 4 Grade View: Grade I Tube type: Oral Tube size: 7.5 mm Number of attempts: 1 Placement Confirmation: ETT inserted through vocal cords under direct vision,  positive ETCO2 and breath sounds checked- equal and bilateral Secured at: 21 cm Tube secured with: Tape Dental Injury: Teeth and Oropharynx as per pre-operative assessment

## 2014-06-04 NOTE — Brief Op Note (Signed)
06/04/2014  8:34 AM  PATIENT:  Victor [name]  23 y.o. male  PRE-OPERATIVE DIAGNOSIS:  Mandible Fracture (Previously Repaired)     POST-OPERATIVE DIAGNOSIS:  Mandible Fracture (Previously Repaired)  PROCEDURE:   Maxillary and Mandibular Hardware Removal  SURGEON:  Surgeon(s) and Role:    * Francene Finders, DDS - Primary  PHYSICIAN ASSISTANT:  None  ASSISTANTS: none   ANESTHESIA:   general  EBL:  Minimal  BLOOD ADMINISTERED:none  DRAINS: none   LOCAL MEDICATIONS USED:  0.5% MARCAINE with 1:200,000 epinephrine and 2% LIDOCAINE with 1:100,000 epinephrine   SPECIMEN:  No Specimen  DISPOSITION OF SPECIMEN:  N/A  COUNTS:  YES  TOURNIQUET:  * No tourniquets in log *  DICTATION: .Note written in EPIC  PLAN OF CARE: Discharge to home after PACU  PATIENT DISPOSITION:  PACU - hemodynamically stable.   Delay start of Pharmacological VTE agent (>24hrs) due to surgical blood loss or risk of bleeding: not applicable

## 2014-06-04 NOTE — Transfer of Care (Signed)
Immediate Anesthesia Transfer of Care Note  Patient: Victor [name]  Procedure(s) Performed: Procedure(s): MANDIBULAR HARDWARE REMOVAL (N/A)  Patient Location: PACU  Anesthesia Type:General  Level of Consciousness: awake and alert   Airway & Oxygen Therapy: Patient Spontanous Breathing and Patient connected to nasal cannula oxygen  Post-op Assessment: Report given to PACU RN and Post -op Vital signs reviewed and stable  Post vital signs: Reviewed and stable  Complications: No apparent anesthesia complications

## 2014-06-04 NOTE — Interval H&P Note (Signed)
History and Physical Interval Note:  06/04/2014 8:18 AM  Victor [name]  has presented today for surgery, with the diagnosis of HEALING MANDIBLE FRACTURE   The various methods of treatment have been discussed with the patient and family. After consideration of risks, benefits and other options for treatment, the patient has consented to  Procedure(s): MANDIBULAR HARDWARE REMOVAL (N/A) and REMOVAL OF MAXILLARY HARDWARE as a surgical intervention .  The patient's history has been reviewed, patient examined, no change in status, stable for surgery.  I have reviewed the patient's chart and labs.  Questions were answered to the patient's satisfaction.     Traverse,[name]

## 2014-06-04 NOTE — Anesthesia Postprocedure Evaluation (Addendum)
  Anesthesia Post-op Note  Patient: Victor [name]  Procedure(s) Performed: Procedure(s): MANDIBULAR HARDWARE REMOVAL (N/A)  Patient Location: PACU  Anesthesia Type:General  Level of Consciousness: awake and alert   Airway and Oxygen Therapy: Patient Spontanous Breathing  Post-op Pain: none  Post-op Assessment: Post-op Vital signs reviewed  Post-op Vital Signs: Reviewed  Last Vitals:  Filed Vitals:   06/04/14 1115  BP:   Pulse:   Temp: 36.3 C  Resp:     Complications: No apparent anesthesia complications
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
## 2014-06-04 NOTE — Progress Notes (Signed)
Pt.'s caregiver is present, Ginnie Smart.  He doesn't recall being given any instruction sheet to guide him on giving pt. meds this a.m. [name] states he gave him liquid med. & 4 pills today but doesn't know what the meds. Were
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
## 2014-06-04 NOTE — Discharge Instructions (Addendum)
HOME CARE INSTRUCTIONS DENTAL PROCEDURES  MEDICATION: Some soreness and discomfort is normal following a dental procedure.  Use of a non-aspirin pain product, like acetaminophen, is recommended.  If pain is not relieved, please call the dentist who performed the procedure.  ORAL HYGIENE: Brushing of the teeth should be resumed the day after surgery.  Begin slowly and softly.  In children, brushing should be done by the parent after every meal.  DIET: A balanced diet is very important during the healing process.   Liquids and soft foods are advisable.  Drink clear liquids at first, then progress to other liquids as tolerated.   ACTIVITY: Limit to quiet indoor activities for 24 hours following surgery.  RETURN TO SCHOOL OR WORK: You may return to school or work in a day or two, or as indicated by your dentist.  GENERAL EXPECTATIONS:  -Bleeding is to be expected, but should slow down over one to two hours.  CALL YOUR DOCTOR IS THESE OCCUR:  -Temperature is 101 degrees or more.  -Persistent bright red bleeding.  -Severe pain.  Return to the doctor's office as needed. Call to make an appointment.  Patient Signature:  ________________________________________________________  Nurse's Signature:  ________________________________________________________  What to eat:  For your first meals, you should eat lightly; only small meals initially.  If you do not have nausea, you may eat larger meals.  Avoid spicy, greasy and heavy food.    General Anesthesia, Adult, Care After  Refer to this sheet in the next few weeks. These instructions provide you with information on caring for yourself after your procedure. Your health care provider may also give you more specific instructions. Your treatment has been planned according to current medical practices, but problems sometimes occur. Call your health care provider if you have any problems or questions after your procedure.  WHAT TO EXPECT AFTER  THE PROCEDURE  After the procedure, it is typical to experience:  Sleepiness.  Nausea and vomiting. HOME CARE INSTRUCTIONS  For the first 24 hours after general anesthesia:  Have a responsible person with you.  Do not drive a car. If you are alone, do not take public transportation.  Do not drink alcohol.  Do not take medicine that has not been prescribed by your health care provider.  Do not sign important papers or make important decisions.  You may resume a normal diet and activities as directed by your health care provider.  Change bandages (dressings) as directed.  If you have questions or problems that seem related to general anesthesia, call the hospital and ask for the anesthetist or anesthesiologist on call. SEEK MEDICAL CARE IF:  You have nausea and vomiting that continue the day after anesthesia.  You develop a rash. SEEK IMMEDIATE MEDICAL CARE IF:  You have difficulty breathing.  You have chest pain.  You have any allergic problems. Document Released: 10/17/2000 Document Revised: 03/13/2013 Document Reviewed: 01/24/2013  [facility] Patient Information 2014 [place], [place].

## 2014-06-05 ENCOUNTER — Encounter (HOSPITAL_COMMUNITY): Payer: Self-pay | Admitting: Oral and Maxillofacial Surgery

## 2014-06-12 ENCOUNTER — Encounter (HOSPITAL_COMMUNITY): Payer: Self-pay | Admitting: *Deleted

## 2014-06-12 MED ORDER — CEFAZOLIN SODIUM-DEXTROSE 2-3 GM-% IV SOLR
2.0000 g | INTRAVENOUS | Status: AC
  Administered 2014-06-13: 2 g via INTRAVENOUS
  Filled 2014-06-12: qty 50
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
## 2014-06-13 ENCOUNTER — Encounter (HOSPITAL_COMMUNITY)
Admission: RE | Disposition: A | Discharge status: Home / Self Care | Payer: Self-pay | Source: Ambulatory Visit | Attending: Neurosurgery

## 2014-06-13 ENCOUNTER — Inpatient Hospital Stay (HOSPITAL_COMMUNITY)
Admission: RE | Admit: 2014-06-13 | Discharge: 2014-06-20 | DRG: 941 | Disposition: A | Discharge status: Home / Self Care | Payer: Medicaid Other | Source: Ambulatory Visit | Attending: Neurosurgery | Admitting: Neurosurgery

## 2014-06-13 ENCOUNTER — Encounter (HOSPITAL_COMMUNITY): Payer: Self-pay | Admitting: *Deleted

## 2014-06-13 ENCOUNTER — Inpatient Hospital Stay (HOSPITAL_COMMUNITY): Payer: Medicaid Other | Admitting: Anesthesiology

## 2014-06-13 DIAGNOSIS — E119 Type 2 diabetes mellitus without complications: Secondary | ICD-10-CM | POA: Diagnosis present

## 2014-06-13 DIAGNOSIS — R261 Paralytic gait: Secondary | ICD-10-CM | POA: Diagnosis present

## 2014-06-13 DIAGNOSIS — S065XAA Traumatic subdural hemorrhage with loss of consciousness status unknown, initial encounter: Secondary | ICD-10-CM | POA: Diagnosis present

## 2014-06-13 DIAGNOSIS — I10 Essential (primary) hypertension: Secondary | ICD-10-CM | POA: Diagnosis present

## 2014-06-13 DIAGNOSIS — F209 Schizophrenia, unspecified: Secondary | ICD-10-CM | POA: Diagnosis present

## 2014-06-13 DIAGNOSIS — S065X9D Traumatic subdural hemorrhage with loss of consciousness of unspecified duration, subsequent encounter: Secondary | ICD-10-CM | POA: Diagnosis present

## 2014-06-13 DIAGNOSIS — S065X9A Traumatic subdural hemorrhage with loss of consciousness of unspecified duration, initial encounter: Secondary | ICD-10-CM | POA: Diagnosis present

## 2014-06-13 DIAGNOSIS — Z87891 Personal history of nicotine dependence: Secondary | ICD-10-CM

## 2014-06-13 HISTORY — PX: CRANIOPLASTY: SHX1407

## 2014-06-13 LAB — GLUCOSE, CAPILLARY
GLUCOSE-CAPILLARY: 67 mg/dL — AB (ref 70–99)
GLUCOSE-CAPILLARY: 66 mg/dL — AB (ref 70–99)
Glucose-Capillary: 50 mg/dL — ABNORMAL LOW (ref 70–99)
Glucose-Capillary: 96 mg/dL (ref 70–99)
Glucose-Capillary: 79 mg/dL (ref 70–99)

## 2014-06-13 SURGERY — CRANIOPLASTY
Anesthesia: General | Site: Head

## 2014-06-13 MED ORDER — DEXTROSE 50 % IV SOLN
25.0000 mL | Freq: Once | INTRAVENOUS | Status: AC
  Administered 2014-06-13: 25 mL via INTRAVENOUS
  Filled 2014-06-13: qty 50

## 2014-06-13 MED ORDER — ATROPINE SULFATE 1 MG/ML IJ SOLN
INTRAMUSCULAR | Status: DC | PRN
  Administered 2014-06-13: .5 mg via INTRAVENOUS

## 2014-06-13 MED ORDER — LIDOCAINE HCL (CARDIAC) 20 MG/ML IV SOLN
INTRAVENOUS | Status: DC | PRN
  Administered 2014-06-13: 80 mg via INTRAVENOUS

## 2014-06-13 MED ORDER — OXYCODONE-ACETAMINOPHEN 5-325 MG PO TABS: 1.0000 | ORAL_TABLET | ORAL | Status: DC | PRN

## 2014-06-13 MED ORDER — HYDROMORPHONE HCL 1 MG/ML IJ SOLN: 0.2500 mg | INTRAMUSCULAR | Status: DC | PRN

## 2014-06-13 MED ORDER — SODIUM CHLORIDE 0.9 % IJ SOLN: 3.0000 mL | INTRAMUSCULAR | Status: DC | PRN

## 2014-06-13 MED ORDER — DEXTROSE 50 % IV SOLN
INTRAVENOUS | Status: AC
  Filled 2014-06-13: qty 50

## 2014-06-13 MED ORDER — LIDOCAINE HCL (CARDIAC) 20 MG/ML IV SOLN
INTRAVENOUS | Status: AC
  Filled 2014-06-13: qty 5

## 2014-06-13 MED ORDER — VALPROIC ACID 250 MG/5ML PO SYRP
500.0000 mg | ORAL_SOLUTION | Freq: Two times a day (BID) | ORAL | Status: DC
  Administered 2014-06-13 – 2014-06-20 (×14): 500 mg via ORAL
  Filled 2014-06-13 (×16): qty 10

## 2014-06-13 MED ORDER — LIDOCAINE-EPINEPHRINE 1 %-1:100000 IJ SOLN
INTRAMUSCULAR | Status: DC | PRN
  Administered 2014-06-13: 20 mL

## 2014-06-13 MED ORDER — ROCURONIUM BROMIDE 50 MG/5ML IV SOLN
INTRAVENOUS | Status: AC
  Filled 2014-06-13: qty 1

## 2014-06-13 MED ORDER — LEVETIRACETAM 100 MG/ML PO SOLN
500.0000 mg | Freq: Two times a day (BID) | ORAL | Status: DC
  Administered 2014-06-13 – 2014-06-20 (×14): 500 mg via ORAL
  Filled 2014-06-13 (×15): qty 5

## 2014-06-13 MED ORDER — PROPOFOL 10 MG/ML IV BOLUS
INTRAVENOUS | Status: AC
  Filled 2014-06-13: qty 20

## 2014-06-13 MED ORDER — SUFENTANIL CITRATE 50 MCG/ML IV SOLN
INTRAVENOUS | Status: DC | PRN
  Administered 2014-06-13: 15 ug via INTRAVENOUS
  Administered 2014-06-13: 5 ug via INTRAVENOUS

## 2014-06-13 MED ORDER — QUETIAPINE FUMARATE 50 MG PO TABS
250.0000 mg | ORAL_TABLET | Freq: Two times a day (BID) | ORAL | Status: DC
  Administered 2014-06-13 – 2014-06-20 (×14): 250 mg
  Filled 2014-06-13 (×10): qty 1
  Filled 2014-06-13: qty 2
  Filled 2014-06-13 (×16): qty 1

## 2014-06-13 MED ORDER — ACETAMINOPHEN 650 MG RE SUPP: 650.0000 mg | RECTAL | Status: DC | PRN

## 2014-06-13 MED ORDER — BACLOFEN 5 MG HALF TABLET: 5.0000 mg | ORAL_TABLET | Freq: Three times a day (TID) | ORAL | Status: DC

## 2014-06-13 MED ORDER — LORAZEPAM 0.5 MG PO TABS: 0.5000 mg | ORAL_TABLET | ORAL | Status: DC | PRN

## 2014-06-13 MED ORDER — 0.9 % SODIUM CHLORIDE (POUR BTL) OPTIME
TOPICAL | Status: DC | PRN
  Administered 2014-06-13: 1000 mL

## 2014-06-13 MED ORDER — NEOSTIGMINE METHYLSULFATE 10 MG/10ML IV SOLN
INTRAVENOUS | Status: DC | PRN
  Administered 2014-06-13: 3 mg via INTRAVENOUS

## 2014-06-13 MED ORDER — ONDANSETRON HCL 4 MG/2ML IJ SOLN
INTRAMUSCULAR | Status: AC
  Filled 2014-06-13: qty 2

## 2014-06-13 MED ORDER — DEXTROSE 50 % IV SOLN
INTRAVENOUS | Status: DC | PRN
  Administered 2014-06-13: 12.5 g via INTRAVENOUS

## 2014-06-13 MED ORDER — CHLORHEXIDINE GLUCONATE 0.12 % MT SOLN
15.0000 mL | Freq: Two times a day (BID) | OROMUCOSAL | Status: DC
  Administered 2014-06-13: 15 mL via OROMUCOSAL
  Filled 2014-06-13: qty 15

## 2014-06-13 MED ORDER — SODIUM CHLORIDE 0.9 % IV SOLN: 250.0000 mL | INTRAVENOUS | Status: DC

## 2014-06-13 MED ORDER — THROMBIN 20000 UNITS EX SOLR
CUTANEOUS | Status: DC | PRN
  Administered 2014-06-13: 16:00:00 via TOPICAL

## 2014-06-13 MED ORDER — SODIUM CHLORIDE 0.9 % IV SOLN
INTRAVENOUS | Status: DC
  Administered 2014-06-13: 18:00:00 via INTRAVENOUS

## 2014-06-13 MED ORDER — GLYCOPYRROLATE 0.2 MG/ML IJ SOLN
INTRAMUSCULAR | Status: AC
  Filled 2014-06-13: qty 2

## 2014-06-13 MED ORDER — ARTIFICIAL TEARS OP OINT
TOPICAL_OINTMENT | OPHTHALMIC | Status: DC | PRN
  Administered 2014-06-13: 1 via OPHTHALMIC

## 2014-06-13 MED ORDER — NEOSTIGMINE METHYLSULFATE 10 MG/10ML IV SOLN
INTRAVENOUS | Status: AC
  Filled 2014-06-13: qty 1

## 2014-06-13 MED ORDER — CEFAZOLIN SODIUM 1-5 GM-% IV SOLN
1.0000 g | Freq: Three times a day (TID) | INTRAVENOUS | Status: AC
  Administered 2014-06-13 – 2014-06-14 (×2): 1 g via INTRAVENOUS
  Filled 2014-06-13 (×2): qty 50

## 2014-06-13 MED ORDER — PROPOFOL 10 MG/ML IV BOLUS
INTRAVENOUS | Status: DC | PRN
  Administered 2014-06-13: 140 mg via INTRAVENOUS

## 2014-06-13 MED ORDER — SODIUM CHLORIDE 0.9 % IJ SOLN
3.0000 mL | Freq: Two times a day (BID) | INTRAMUSCULAR | Status: DC
  Administered 2014-06-13 – 2014-06-18 (×8): 3 mL via INTRAVENOUS

## 2014-06-13 MED ORDER — ONDANSETRON HCL 4 MG/2ML IJ SOLN: 4.0000 mg | INTRAMUSCULAR | Status: DC | PRN

## 2014-06-13 MED ORDER — HYDRALAZINE HCL 20 MG/ML IJ SOLN
INTRAMUSCULAR | Status: AC
  Filled 2014-06-13: qty 1

## 2014-06-13 MED ORDER — DEXTROSE 50 % IV SOLN
INTRAVENOUS | Status: AC
  Administered 2014-06-13: 25 mL
  Filled 2014-06-13: qty 50

## 2014-06-13 MED ORDER — BACLOFEN 10 MG PO TABS
5.0000 mg | ORAL_TABLET | Freq: Three times a day (TID) | ORAL | Status: DC
  Administered 2014-06-13 – 2014-06-20 (×20): 5 mg via ORAL
  Filled 2014-06-13 (×22): qty 1

## 2014-06-13 MED ORDER — GLYCOPYRROLATE 0.2 MG/ML IJ SOLN
INTRAMUSCULAR | Status: DC | PRN
  Administered 2014-06-13: .2 mg via INTRAVENOUS
  Administered 2014-06-13: .4 mg via INTRAVENOUS

## 2014-06-13 MED ORDER — LACTATED RINGERS IV SOLN
INTRAVENOUS | Status: DC | PRN
  Administered 2014-06-13 (×2): via INTRAVENOUS

## 2014-06-13 MED ORDER — ATROPINE SULFATE 0.1 MG/ML IJ SOLN
INTRAMUSCULAR | Status: AC
  Filled 2014-06-13: qty 10

## 2014-06-13 MED ORDER — HYDRALAZINE HCL 20 MG/ML IJ SOLN
5.0000 mg | Freq: Once | INTRAMUSCULAR | Status: AC
  Administered 2014-06-13: 5 mg via INTRAVENOUS

## 2014-06-13 MED ORDER — BACITRACIN ZINC 500 UNIT/GM EX OINT
TOPICAL_OINTMENT | CUTANEOUS | Status: DC | PRN
  Administered 2014-06-13: 1 via TOPICAL

## 2014-06-13 MED ORDER — ROCURONIUM BROMIDE 100 MG/10ML IV SOLN
INTRAVENOUS | Status: DC | PRN
  Administered 2014-06-13: 40 mg via INTRAVENOUS
  Administered 2014-06-13: 20 mg via INTRAVENOUS

## 2014-06-13 MED ORDER — SUFENTANIL CITRATE 50 MCG/ML IV SOLN
INTRAVENOUS | Status: AC
  Filled 2014-06-13: qty 1

## 2014-06-13 MED ORDER — OXYCODONE HCL 5 MG PO TABS
5.0000 mg | ORAL_TABLET | Freq: Once | ORAL | Status: DC | PRN
Start: 2014-06-13 — End: 2014-06-13

## 2014-06-13 MED ORDER — LACTATED RINGERS IV SOLN: INTRAVENOUS | Status: DC

## 2014-06-13 MED ORDER — ESCITALOPRAM OXALATE 10 MG PO TABS
10.0000 mg | ORAL_TABLET | Freq: Every day | ORAL | Status: DC
  Administered 2014-06-13 – 2014-06-20 (×8): 10 mg via ORAL
  Filled 2014-06-13 (×8): qty 1

## 2014-06-13 MED ORDER — MORPHINE SULFATE 2 MG/ML IJ SOLN: 1.0000 mg | INTRAMUSCULAR | Status: DC | PRN

## 2014-06-13 MED ORDER — PROPRANOLOL HCL 20 MG/5ML PO SOLN
20.0000 mg | Freq: Four times a day (QID) | ORAL | Status: DC
  Administered 2014-06-13 – 2014-06-20 (×25): 20 mg via ORAL
  Filled 2014-06-13 (×29): qty 5

## 2014-06-13 MED ORDER — SODIUM CHLORIDE 0.9 % IJ SOLN
INTRAMUSCULAR | Status: AC
  Filled 2014-06-13: qty 20

## 2014-06-13 MED ORDER — MIDAZOLAM HCL 2 MG/2ML IJ SOLN
INTRAMUSCULAR | Status: AC
  Filled 2014-06-13: qty 2

## 2014-06-13 MED ORDER — ACETAMINOPHEN 325 MG PO TABS: 650.0000 mg | ORAL_TABLET | ORAL | Status: DC | PRN

## 2014-06-13 MED ORDER — PHENOL 1.4 % MT LIQD: 1.0000 | OROMUCOSAL | Status: DC | PRN

## 2014-06-13 MED ORDER — DEXTROSE 50 % IV SOLN
25.0000 mL | Freq: Once | INTRAVENOUS | Status: AC
  Administered 2014-06-13: 25 mL via INTRAVENOUS

## 2014-06-13 MED ORDER — MENTHOL 3 MG MT LOZG: 1.0000 | LOZENGE | OROMUCOSAL | Status: DC | PRN

## 2014-06-13 MED ORDER — ONDANSETRON HCL 4 MG/2ML IJ SOLN
INTRAMUSCULAR | Status: DC | PRN
  Administered 2014-06-13: 4 mg via INTRAVENOUS

## 2014-06-13 MED ORDER — OXYCODONE HCL 5 MG/5ML PO SOLN: 5.0000 mg | Freq: Once | ORAL | Status: DC | PRN

## 2014-06-13 MED ORDER — POLYETHYLENE GLYCOL 3350 17 G PO PACK
17.0000 g | PACK | Freq: Every day | ORAL | Status: DC
  Administered 2014-06-15 – 2014-06-17 (×3): 17 g via ORAL
  Filled 2014-06-13 (×7): qty 1

## 2014-06-13 SURGICAL SUPPLY — 63 items
BAG DECANTER FOR FLEXI CONT (MISCELLANEOUS) ×1 IMPLANT
BANDAGE GAUZE 4  KLING STR (GAUZE/BANDAGES/DRESSINGS) ×2 IMPLANT
BNDG GAUZE ELAST 4 BULKY (GAUZE/BANDAGES/DRESSINGS) ×3 IMPLANT
BOWL SPATULA (MISCELLANEOUS) ×1 IMPLANT
BUR TAPERED ROUTER 2.3 (BUR) IMPLANT
BURR TAPERED ROUTER 2.3 (BUR) ×3
CANISTER SUCT 3000ML (MISCELLANEOUS) ×3 IMPLANT
CONT SPEC 4OZ CLIKSEAL STRL BL (MISCELLANEOUS) IMPLANT
CORDS BIPOLAR (ELECTRODE) ×3 IMPLANT
COVER BACK TABLE 60X90IN (DRAPES) IMPLANT
DEPRESSOR TONGUE BLADE STERILE (MISCELLANEOUS) IMPLANT
DRAPE NEUROLOGICAL W/INCISE (DRAPES) ×3 IMPLANT
ELECT CAUTERY BLADE 6.4 (BLADE) ×1 IMPLANT
ELECT REM PT RETURN 9FT ADLT (ELECTROSURGICAL) ×3
ELECTRODE REM PT RTRN 9FT ADLT (ELECTROSURGICAL) ×1 IMPLANT
EVACUATOR 1/8 PVC DRAIN (DRAIN) ×3 IMPLANT
GAUZE SPONGE 4X4 12PLY STRL (GAUZE/BANDAGES/DRESSINGS) ×6 IMPLANT
GAUZE SPONGE 4X4 16PLY XRAY LF (GAUZE/BANDAGES/DRESSINGS) IMPLANT
GLOVE BIOGEL M 8.0 STRL (GLOVE) ×3 IMPLANT
GLOVE BIOGEL PI IND STRL 7.5 (GLOVE) ×1 IMPLANT
GLOVE BIOGEL PI IND STRL 8.5 (GLOVE) IMPLANT
GLOVE BIOGEL PI INDICATOR 7.5 (GLOVE) ×2
GLOVE BIOGEL PI INDICATOR 8.5 (GLOVE) ×2
GLOVE ECLIPSE 7.5 STRL STRAW (GLOVE) ×4 IMPLANT
GLOVE ECLIPSE 8.5 STRL (GLOVE) ×3 IMPLANT
GLOVE EXAM NITRILE LRG STRL (GLOVE) IMPLANT
GLOVE EXAM NITRILE MD LF STRL (GLOVE) IMPLANT
GLOVE EXAM NITRILE XL STR (GLOVE) IMPLANT
GLOVE EXAM NITRILE XS STR PU (GLOVE) IMPLANT
GOWN STRL REUS W/ TWL LRG LVL3 (GOWN DISPOSABLE) IMPLANT
GOWN STRL REUS W/ TWL XL LVL3 (GOWN DISPOSABLE) IMPLANT
GOWN STRL REUS W/TWL 2XL LVL3 (GOWN DISPOSABLE) ×2 IMPLANT
GOWN STRL REUS W/TWL LRG LVL3 (GOWN DISPOSABLE) ×3
GOWN STRL REUS W/TWL XL LVL3 (GOWN DISPOSABLE) ×3
HEMOSTAT SURGICEL 2X14 (HEMOSTASIS) IMPLANT
HOOK DURA (MISCELLANEOUS) ×3 IMPLANT
KIT BASIN OR (CUSTOM PROCEDURE TRAY) ×3 IMPLANT
KIT ROOM TURNOVER OR (KITS) ×3 IMPLANT
NDL HYPO 25X1 1.5 SAFETY (NEEDLE) ×1 IMPLANT
NEEDLE HYPO 25X1 1.5 SAFETY (NEEDLE) ×3 IMPLANT
NS IRRIG 1000ML POUR BTL (IV SOLUTION) ×5 IMPLANT
PACK CRANIOTOMY (CUSTOM PROCEDURE TRAY) ×3 IMPLANT
PAD ARMBOARD 7.5X6 YLW CONV (MISCELLANEOUS) ×7 IMPLANT
PIN MAYFIELD SKULL DISP (PIN) IMPLANT
PLATE 1.5  2HOLE LNG NEURO (Plate) ×8 IMPLANT
PLATE 1.5 2HOLE LNG NEURO (Plate) IMPLANT
SCREW SELF DRILL HT 1.5/4MM (Screw) ×16 IMPLANT
SET CRAINOPLASTY (SET/KITS/TRAYS/PACK) ×1 IMPLANT
SPONGE SURGIFOAM ABS GEL 100 (HEMOSTASIS) IMPLANT
STAPLER SKIN PROX WIDE 3.9 (STAPLE) ×4 IMPLANT
STAPLER VISISTAT 35W (STAPLE) ×3 IMPLANT
SUT NURALON 4 0 TR CR/8 (SUTURE) ×2 IMPLANT
SUT VIC AB 0 CT1 18XCR BRD8 (SUTURE) ×1 IMPLANT
SUT VIC AB 0 CT1 8-18 (SUTURE) ×3
SUT VIC AB 2-0 CP2 18 (SUTURE) ×10 IMPLANT
SYR 20ML ECCENTRIC (SYRINGE) ×1 IMPLANT
SYR CONTROL 10ML LL (SYRINGE) ×3 IMPLANT
TAPE CLOTH SURG 4X10 WHT LF (GAUZE/BANDAGES/DRESSINGS) ×3 IMPLANT
TOWEL OR 17X24 6PK STRL BLUE (TOWEL DISPOSABLE) ×3 IMPLANT
TOWEL OR 17X26 10 PK STRL BLUE (TOWEL DISPOSABLE) ×3 IMPLANT
TRAY FOLEY CATH 16FRSI W/METER (SET/KITS/TRAYS/PACK) IMPLANT
UNDERPAD 30X30 INCONTINENT (UNDERPADS AND DIAPERS) IMPLANT
WATER STERILE IRR 1000ML POUR (IV SOLUTION) ×3 IMPLANT

## 2014-06-13 NOTE — Transfer of Care (Signed)
Immediate Anesthesia Transfer of Care Note  Patient: Victor [name]  Procedure(s) Performed: Procedure(s) with comments: CRANIOPLASTY/REPAIR OF CRANIAL DEFECT/BONE FLAP IN ABDOMEN WALL (N/A) - CRANIOPLASTY/REPAIR OF CRANIAL DEFECT/BONE FLAP IN ABDOMEN WALL  Patient Location: PACU  Anesthesia Type:General  Level of Consciousness: awake, alert , oriented and patient cooperative  Airway & Oxygen Therapy: Patient Spontanous Breathing and Patient connected to face mask oxygen  Post-op Assessment: Report given to PACU RN, Post -op Vital signs reviewed and stable, Patient moving all extremities and Patient moving all extremities X 4  Post vital signs: Reviewed and stable  Complications: No apparent anesthesia complications
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
## 2014-06-13 NOTE — Progress Notes (Signed)
Blood sugar 67 upon arrival to unit, 1/2 amp d50 given per protocol. Recheck sugar = 96.  Corliss Skains RN

## 2014-06-13 NOTE — Progress Notes (Signed)
CBg 48 and rechecked 50. Dr Maple Hudson called and informed, also informed that pt is on his way upstairs

## 2014-06-13 NOTE — Progress Notes (Signed)
Blood glucose 47 in preop, Dr Maple Hudson aware and D 50 1/2 amp given

## 2014-06-13 NOTE — Anesthesia Procedure Notes (Signed)
Procedure Name: Intubation Date/Time: 06/13/2014 2:14 PM Performed by: Izora Gala Pre-anesthesia Checklist: Patient identified, Emergency Drugs available, Suction available and Patient being monitored Patient Re-evaluated:Patient Re-evaluated prior to inductionOxygen Delivery Method: Circle system utilized Preoxygenation: Pre-oxygenation with 100% oxygen Intubation Type: IV induction Ventilation: Mask ventilation without difficulty Laryngoscope Size: Miller and 3 Grade View: Grade I Tube size: 7.5 mm Number of attempts: 1 Airway Equipment and Method: Stylet,  LTA kit utilized and Bite block Placement Confirmation: ETT inserted through vocal cords under direct vision,  positive ETCO2 and breath sounds checked- equal and bilateral Secured at: 23 cm Tube secured with: Tape Dental Injury: Teeth and Oropharynx as per pre-operative assessment

## 2014-06-13 NOTE — Progress Notes (Signed)
I Spoke with patient's Victor [name], Victor [name], who is his caregiver. They have not been able to locate Patient's legal guardian, [name]. He is in the process of obtaining legal guardianship, but their hands have been tied due to inability to locate "[name]". Patient also states that he lives with his uncle, and "I love my Uncle".

## 2014-06-13 NOTE — Anesthesia Postprocedure Evaluation (Signed)
  Anesthesia Post-op Note  Patient: Victor AngDave [name]  Procedure(s) Performed: Procedure(s) with comments: CRANIOPLASTY/REPAIR OF CRANIAL DEFECT/BONE FLAP IN ABDOMEN WALL (N/A) - CRANIOPLASTY/REPAIR OF CRANIAL DEFECT/BONE FLAP IN ABDOMEN WALL  Patient Location: PACU  Anesthesia Type:General  Level of Consciousness: awake  Airway and Oxygen Therapy: Patient Spontanous Breathing  Post-op Pain: mild  Post-op Assessment: Post-op Vital signs reviewed, Patient's Cardiovascular Status Stable, Respiratory Function Stable, Patent Airway, No signs of Nausea or vomiting and Pain level controlled  Post-op Vital Signs: Reviewed and stable  Last Vitals:  Filed Vitals:   06/13/14 1706  BP:   Pulse:   Temp: 36.5 C  Resp:     Complications: No apparent anesthesia complications

## 2014-06-13 NOTE — Progress Notes (Signed)
Unable to obtain IV access. Dr Maple Hudson called and informed. States to send him up, and they can assess him there.
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
## 2014-06-14 LAB — GLUCOSE, CAPILLARY
GLUCOSE-CAPILLARY: 113 mg/dL — AB (ref 70–99)
GLUCOSE-CAPILLARY: 138 mg/dL — AB (ref 70–99)
GLUCOSE-CAPILLARY: 57 mg/dL — AB (ref 70–99)
GLUCOSE-CAPILLARY: 82 mg/dL (ref 70–99)
GLUCOSE-CAPILLARY: 92 mg/dL (ref 70–99)
Glucose-Capillary: 47 mg/dL — ABNORMAL LOW (ref 70–99)
Glucose-Capillary: 92 mg/dL (ref 70–99)
Glucose-Capillary: 87 mg/dL (ref 70–99)
Glucose-Capillary: 81 mg/dL (ref 70–99)
Glucose-Capillary: 110 mg/dL — ABNORMAL HIGH (ref 70–99)

## 2014-06-14 MED ORDER — DEXTROSE 10 % IV SOLN
INTRAVENOUS | Status: DC | PRN
Start: 2014-06-14 — End: 2014-06-14

## 2014-06-14 NOTE — Plan of Care (Signed)
Problem: Phase I Progression Outcomes Goal: Neuro exam at baseline or improved Outcome: Completed/Met Date Met:  06/14/14 Goal: Respiratory status stable Outcome: Completed/Met Date Met:  06/14/14 Goal: Hemodynamically stable Outcome: Completed/Met Date Met:  06/14/14 Goal: Pain controlled with appropriate interventions Outcome: Completed/Met Date Met:  06/14/14 Goal: Bedrest HOB at 30 degrees Outcome: Completed/Met Date Met:  06/14/14 Goal: Voiding-avoid urinary catheter unless indicated Outcome: Completed/Met Date Met:  06/14/14 Goal: Initial discharge plan identified Outcome: Completed/Met Date Met:  06/14/14 Goal: Other Phase I Outcomes/Goals Outcome: Not Applicable Date Met:  99/37/16  Problem: Phase II Progression Outcomes Goal: Neuro exam at baseline or improved Outcome: Completed/Met Date Met:  06/14/14 Goal: Extubated and maintains O2 sats > 92% Outcome: Completed/Met Date Met:  06/14/14 Goal: Hemodynamically stable Outcome: Completed/Met Date Met:  06/14/14 Goal: Pain controlled Outcome: Completed/Met Date Met:  06/14/14 Goal: Tolerating diet Outcome: Completed/Met Date Met:  06/14/14

## 2014-06-14 NOTE — Plan of Care (Signed)
Problem: Consults Goal: Diagnosis - Craniotomy Outcome: Completed/Met Date Met:  06/14/14 Subdural hematoma Goal: Skin Care Protocol Initiated - if Braden Score 18 or less If consults are not indicated, leave blank or document N/A Outcome: Completed/Met Date Met:  06/14/14 Goal: Nutrition Consult-if indicated Outcome: Not Applicable Date Met:  39/21/51 Goal: Diabetes Guidelines if Diabetic/Glucose > 140 If diabetic or lab glucose is > 140 mg/dl - Initiate Diabetes/Hyperglycemia Guidelines & Document Interventions  Outcome: Completed/Met Date Met:  06/14/14

## 2014-06-14 NOTE — Progress Notes (Signed)
Hypoglycemic Event  CBG: 67  Treatment: orange juice  Symptoms: none  Follow-up CBG: Time:0055 CBG Result:87  Possible Reasons for Event: unknown  Comments/MD notified: no    Alesia Banda  Remember to initiate Hypoglycemia Order Set & complete

## 2014-06-14 NOTE — Progress Notes (Signed)
Patient admitted from [id number]. Patient alert and oriented x 1. Patient oriented to room. Will continue to monitor.
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
## 2014-06-15 LAB — GLUCOSE, CAPILLARY
GLUCOSE-CAPILLARY: 80 mg/dL (ref 70–99)
GLUCOSE-CAPILLARY: 129 mg/dL — AB (ref 70–99)
GLUCOSE-CAPILLARY: 130 mg/dL — AB (ref 70–99)
Glucose-Capillary: 101 mg/dL — ABNORMAL HIGH (ref 70–99)

## 2014-06-15 MED ORDER — INFLUENZA VAC SPLIT QUAD 0.5 ML IM SUSY
0.5000 mL | PREFILLED_SYRINGE | INTRAMUSCULAR | Status: AC
  Administered 2014-06-16: 0.5 mL via INTRAMUSCULAR
  Filled 2014-06-15: qty 0.5

## 2014-06-15 MED ORDER — PNEUMOCOCCAL VAC POLYVALENT 25 MCG/0.5ML IJ INJ
0.5000 mL | INJECTION | INTRAMUSCULAR | Status: AC
  Administered 2014-06-16: 0.5 mL via INTRAMUSCULAR
  Filled 2014-06-15: qty 0.5

## 2014-06-15 NOTE — Progress Notes (Signed)
Patient ID: Victor [name], male   DOB: 1990/08/05, 23 y.o.   MRN: [id number] Subjective:  The patient is alert and pleasant. He is in no apparent distress.  Objective: Vital signs in last 24 hours: Temp:  [98.1 F (36.7 C)-99.7 F (37.6 C)] 98.1 F (36.7 C) (11/22 0624) Pulse Rate:  [68-99] 68 (11/22 0624) Resp:  [18-21] 18 (11/22 0624) BP: (122-148)/(75-95) 133/80 mmHg (11/22 0624) SpO2:  [96 %-99 %] 99 % (11/22 0624)  Intake/Output from previous day: 11/21 0701 - 11/22 0700 In: 75 [I.V.:75] Out: 550 [Urine:550] Intake/Output this shift:    Physical exam the patient is alert and pleasant. His wound is healing well.  Lab Results: No results for input(s): WBC, HGB, HCT, PLT in the last 72 hours. BMET No results for input(s): NA, K, CL, CO2, GLUCOSE, BUN, CREATININE, CALCIUM in the last 72 hours.  Studies/Results: No results found.  Assessment/Plan: Status post cranioplasty day #2: The patient may go home tomorrow  LOS: 2 days     Emmett Arntz D 06/15/2014, 10:15 AM
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
## 2014-06-15 NOTE — Progress Notes (Signed)
Utilization Review Completed.Victor [name] T11/22/2015  

## 2014-06-16 LAB — GLUCOSE, CAPILLARY
GLUCOSE-CAPILLARY: 92 mg/dL (ref 70–99)
GLUCOSE-CAPILLARY: 105 mg/dL — AB (ref 70–99)
GLUCOSE-CAPILLARY: 117 mg/dL — AB (ref 70–99)
Glucose-Capillary: 82 mg/dL (ref 70–99)

## 2014-06-16 NOTE — Progress Notes (Signed)
Rehab Admissions Coordinator Note:  Patient was screened by Trish Mage for appropriateness for an Inpatient Acute Rehab Consult.  At this time, an inpatient rehab consult has already been ordered and is pending completion.  Weldon Picking will follow up after the consult is complete and she can be reached at [phone]  Trish Mage 06/16/2014, 4:20 PM
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
## 2014-06-16 NOTE — Progress Notes (Signed)
Talked to patient's uncle Victor [name] [phone]( [phone]) about DCP; [facility] but the family had difficult traveling to see the patient and took him home; Patient's insurance provider is Medicaid and beds are limited in this area. Patient is a mod - max assist and possibly may need Inpt rehab prior to returning home which the family wants him to have if he qualifies; CM talked to PT/OT for input on his mobilityAlexis [name]; B Ellisyn Icenhower RN,BSN,MHA [phone]

## 2014-06-16 NOTE — Progress Notes (Signed)
Patient ID: Victor [name], male   DOB: [birth year], 22 y.o.   MRN: [id number] Doing better. Wounds dry  Dc in am

## 2014-06-16 NOTE — Progress Notes (Signed)
Physical Therapy  Spoke with Steward Drone, Care Manager re: her discussion with pt's uncle. He does not think he can care for patient at this current level. Recommend assess for post-acute therapy options. (Noted pt was on [facility] CIR unit with discharge early Oct to SNF. He had progressed from SNF to his uncle's home ambulatory.).   06/16/14 1511  PT - Assessment/Plan  PT Plan Discharge plan needs to be updated  Follow Up Recommendations CIR;Supervision/Assistance - 24 hour  PT equipment Rolling walker with 5" wheels    06/16/2014 Veda Canning, PT Pager: [phone]

## 2014-06-17 ENCOUNTER — Encounter (HOSPITAL_COMMUNITY): Payer: Self-pay | Admitting: Neurosurgery

## 2014-06-17 DIAGNOSIS — S069X5S Unspecified intracranial injury with loss of consciousness greater than 24 hours with return to pre-existing conscious level, sequela: Secondary | ICD-10-CM

## 2014-06-17 LAB — GLUCOSE, CAPILLARY
GLUCOSE-CAPILLARY: 110 mg/dL — AB (ref 70–99)
GLUCOSE-CAPILLARY: 128 mg/dL — AB (ref 70–99)
Glucose-Capillary: 112 mg/dL — ABNORMAL HIGH (ref 70–99)
Glucose-Capillary: 114 mg/dL — ABNORMAL HIGH (ref 70–99)

## 2014-06-17 NOTE — Progress Notes (Signed)
Inpatient Rehabilitation  I met with Mr. Meinhardt to inform him of Dr. Naaman Plummer' recommendation of home therapies as there is not likely much to add from a CIR standpoint at this time.  Please call if questions. I will sign off.    [facility] [facility] Admissions Coordinator Cell [phone] Office [phone]

## 2014-06-17 NOTE — Progress Notes (Signed)
Doing well. Wound dry. Can be discharge anytime.
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
## 2014-06-17 NOTE — Progress Notes (Signed)
Received message from [facility], patient is active with [facility] for HHPT/OT/ RN as prior to admission; Alexis Goodell [phone]

## 2014-06-18 ENCOUNTER — Encounter (HOSPITAL_COMMUNITY): Payer: Self-pay | Admitting: General Practice

## 2014-06-18 LAB — GLUCOSE, CAPILLARY
GLUCOSE-CAPILLARY: 94 mg/dL (ref 70–99)
GLUCOSE-CAPILLARY: 122 mg/dL — AB (ref 70–99)
Glucose-Capillary: 80 mg/dL (ref 70–99)
Glucose-Capillary: 199 mg/dL — ABNORMAL HIGH (ref 70–99)

## 2014-06-18 NOTE — Progress Notes (Signed)
Spoke with [place], SW and [place], CM regarding the inability for room 3 to leave.  Steward Drone stated that she had also spoken to the uncle and made him aware that the patient had been discharged and that he needed to come and get this patient.  The Kateri Mc is in [place]. And will return Thursday or Friday.  Uncle was told by case management that the insurance would not cover the rest of the stay.  Case Mgmt said no reason to alert MD and to leave dc in the computer.  Department Director aware.  Mariam Dollar

## 2014-06-18 NOTE — Progress Notes (Signed)
Pt has been discharged. Pt's uncle (caregiver) contacted to see when he could come pick up pt. Caregiver is out of town and will be back Thurs night or Fri morning. Consulting civil engineerCharge RN, Interior and spatial designerdirector aware. Will continue to monitor pt at this time.
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
## 2014-06-18 NOTE — Progress Notes (Signed)
Victor [name] with [facility] made aware of discharge home today with Uncle; orders for [facility] services resumed as prior to admission; Alexis Goodell [phone]

## 2014-06-18 NOTE — Discharge Summary (Signed)
Physician Discharge Summary  Patient ID: Victor [name] MRN: [id number] DOB/AGE: [birth year] 22 y.o.  Admit date: 06/13/2014 Discharge date: 06/18/2014  Admission Diagnoses:subdural hematoma. Postop cranial defect  Discharge Diagnoses:  Active Problems:   Subdural hematoma   Discharged Condition: stable  Hospital Course: surgery  Consults: none  Significant Diagnostic Studies: ct head  Treatments: repair cranial defect  Discharge Exam: Blood pressure 116/78, pulse 64, temperature 98.2 F (36.8 C), temperature source Oral, resp. rate 20, height 5\' 9"  (1.753 m), weight 96.616 kg (213 lb), SpO2 100 %. Wounds dry. Neuro as preop  Disposition: home at the care of his uncle. To see me in 2 weeks     Medication List    ASK your doctor about these medications        baclofen 10 MG tablet  Commonly known as:  LIORESAL  Take 5 mg by mouth 3 (three) times daily. Uncle could not find this medication.     baclofen 5 mg Tabs tablet  Commonly known as:  LIORESAL  Take 0.5 tablets (5 mg total) by mouth 3 (three) times daily.     chlorhexidine 0.12 % solution  Commonly known as:  PERIDEX  Use as directed 15 mLs in the mouth or throat 2 (two) times daily.     escitalopram 10 MG tablet  Commonly known as:  LEXAPRO  Take 10 mg by mouth daily.     HYDROcodone-acetaminophen 5-325 MG per tablet  Commonly known as:  NORCO/VICODIN  Place 1 tablet into feeding tube every 4 (four) hours as needed for moderate pain.     levETIRAcetam 100 MG/ML solution  Commonly known as:  KEPPRA  Place 5 mLs (500 mg total) into feeding tube 2 (two) times daily.     LORazepam 0.5 MG tablet  Commonly known as:  ATIVAN  Place 1 tablet (0.5 mg total) into feeding tube every 4 (four) hours as needed for anxiety.     polyethylene glycol packet  Commonly known as:  MIRALAX / GLYCOLAX  Take 17 g by mouth daily.     propranolol 20 MG/5ML solution  Commonly known as:  INDERAL  Take 20 mg by mouth 4 (four)  times daily.     QUEtiapine 100 MG tablet  Commonly known as:  SEROQUEL  Place 2.5 tablets (250 mg total) into feeding tube 2 (two) times daily.     Valproic Acid 250 MG/5ML Syrp syrup  Commonly known as:  DEPAKENE  Take 500 mg by mouth every 12 (twelve) hours.         Signed: Karn Cassis 06/18/2014, 9:48 AM

## 2014-06-19 LAB — GLUCOSE, CAPILLARY
GLUCOSE-CAPILLARY: 113 mg/dL — AB (ref 70–99)
Glucose-Capillary: 96 mg/dL (ref 70–99)
Glucose-Capillary: 119 mg/dL — ABNORMAL HIGH (ref 70–99)
Glucose-Capillary: 154 mg/dL — ABNORMAL HIGH (ref 70–99)

## 2014-06-19 NOTE — Progress Notes (Signed)
Patient ID: Victor [name], male   DOB: March 24, 1991, 23 y.o.   MRN: [id number] Stable. Wounds dry. Discharge paper done yesterday. He was to go to his uncle house.

## 2014-06-20 LAB — GLUCOSE, CAPILLARY
GLUCOSE-CAPILLARY: 114 mg/dL — AB (ref 70–99)
Glucose-Capillary: 104 mg/dL — ABNORMAL HIGH (ref 70–99)
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
## 2014-06-20 NOTE — Progress Notes (Signed)
No change in exam. He is awake and alert. He is walking with a walker and with physical therapy as we speak. He has a spastic gait. His incision is clean dry and intact.

## 2014-06-20 NOTE — Progress Notes (Signed)
Patient DC'd home with uncle.  DC instructions given to uncle along with 4 wheel walker.  Vital signs and assessments were stable prior to discharge.

## 2014-06-20 NOTE — Progress Notes (Signed)
[facility] called for rolling walker to be delivered to the room prior to discharge home today; B Corwyn Vora RN,BSN,MHA [phone] 

## 2014-06-25 ENCOUNTER — Encounter: Payer: Medicaid Other | Attending: Physical Medicine & Rehabilitation | Admitting: Physical Medicine & Rehabilitation

## 2014-06-25 ENCOUNTER — Encounter: Payer: Self-pay | Admitting: Physical Medicine & Rehabilitation

## 2014-06-25 VITALS — BP 140/71 | HR 118 | Resp 14 | Ht 69.0 in | Wt 226.0 lb

## 2014-06-25 DIAGNOSIS — W3400XA Accidental discharge from unspecified firearms or gun, initial encounter: Secondary | ICD-10-CM

## 2014-06-25 DIAGNOSIS — S069X0A Unspecified intracranial injury without loss of consciousness, initial encounter: Secondary | ICD-10-CM | POA: Diagnosis present

## 2014-06-25 DIAGNOSIS — E119 Type 2 diabetes mellitus without complications: Secondary | ICD-10-CM | POA: Diagnosis not present

## 2014-06-25 DIAGNOSIS — F209 Schizophrenia, unspecified: Secondary | ICD-10-CM | POA: Diagnosis not present

## 2014-06-25 DIAGNOSIS — S065X5S Traumatic subdural hemorrhage with loss of consciousness greater than 24 hours with return to pre-existing conscious level, sequela: Secondary | ICD-10-CM

## 2014-06-25 DIAGNOSIS — T148 Other injury of unspecified body region: Secondary | ICD-10-CM

## 2014-06-25 DIAGNOSIS — I10 Essential (primary) hypertension: Secondary | ICD-10-CM | POA: Insufficient documentation

## 2014-06-25 DIAGNOSIS — Z87891 Personal history of nicotine dependence: Secondary | ICD-10-CM | POA: Insufficient documentation

## 2014-06-25 DIAGNOSIS — F259 Schizoaffective disorder, unspecified: Secondary | ICD-10-CM

## 2014-06-25 DIAGNOSIS — Z79899 Other long term (current) drug therapy: Secondary | ICD-10-CM | POA: Diagnosis not present

## 2014-06-25 MED ORDER — BACLOFEN 10 MG PO TABS: 5.0000 mg | ORAL_TABLET | Freq: Three times a day (TID) | ORAL | Status: DC

## 2014-06-25 MED ORDER — DIVALPROEX SODIUM 500 MG PO DR TAB: 500.0000 mg | DELAYED_RELEASE_TABLET | Freq: Two times a day (BID) | ORAL | Status: DC

## 2014-06-25 MED ORDER — LEVETIRACETAM 500 MG PO TABS: 500.0000 mg | ORAL_TABLET | Freq: Two times a day (BID) | ORAL | Status: DC

## 2014-06-25 MED ORDER — ESCITALOPRAM OXALATE 10 MG PO TABS: 10.0000 mg | ORAL_TABLET | Freq: Every day | ORAL | Status: AC

## 2014-06-25 MED ORDER — PROPRANOLOL HCL 20 MG PO TABS: 20.0000 mg | ORAL_TABLET | Freq: Four times a day (QID) | ORAL | Status: DC

## 2014-06-25 MED ORDER — LORAZEPAM 0.5 MG PO TABS: 0.5000 mg | ORAL_TABLET | ORAL | Status: DC | PRN

## 2014-06-25 NOTE — Patient Instructions (Signed)
TAKE YOUR TIME WHEN YOU WALK!!!!
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
## 2014-07-01 DIAGNOSIS — R482 Apraxia: Secondary | ICD-10-CM

## 2014-07-01 DIAGNOSIS — G8194 Hemiplegia, unspecified affecting left nondominant side: Secondary | ICD-10-CM

## 2014-07-01 DIAGNOSIS — R1310 Dysphagia, unspecified: Secondary | ICD-10-CM

## 2014-07-01 DIAGNOSIS — R413 Other amnesia: Secondary | ICD-10-CM

## 2014-08-25 ENCOUNTER — Encounter: Payer: Medicaid Other | Attending: Physical Medicine & Rehabilitation | Admitting: Physical Medicine & Rehabilitation

## 2014-08-25 DIAGNOSIS — Z87891 Personal history of nicotine dependence: Secondary | ICD-10-CM | POA: Insufficient documentation

## 2014-08-25 DIAGNOSIS — Z79899 Other long term (current) drug therapy: Secondary | ICD-10-CM | POA: Insufficient documentation

## 2014-08-25 DIAGNOSIS — S069X0A Unspecified intracranial injury without loss of consciousness, initial encounter: Secondary | ICD-10-CM | POA: Insufficient documentation

## 2014-08-25 DIAGNOSIS — I10 Essential (primary) hypertension: Secondary | ICD-10-CM | POA: Insufficient documentation

## 2014-08-25 DIAGNOSIS — F209 Schizophrenia, unspecified: Secondary | ICD-10-CM | POA: Insufficient documentation

## 2014-08-25 DIAGNOSIS — E119 Type 2 diabetes mellitus without complications: Secondary | ICD-10-CM | POA: Insufficient documentation

## 2014-08-28 ENCOUNTER — Other Ambulatory Visit: Payer: Self-pay | Admitting: Physical Medicine & Rehabilitation

## 2014-08-29 NOTE — Telephone Encounter (Signed)
Mr okon no showed for his 08/25/14 appt but I refilled his keppra x1 and told pharmacy any further refills require appt.

## 2014-09-16 ENCOUNTER — Other Ambulatory Visit: Payer: Self-pay | Admitting: Physical Medicine & Rehabilitation

## 2014-09-19 ENCOUNTER — Ambulatory Visit: Payer: No Typology Code available for payment source | Attending: Neurosurgery | Admitting: Physical Therapy

## 2014-10-09 ENCOUNTER — Other Ambulatory Visit: Payer: Self-pay | Admitting: Physical Medicine & Rehabilitation

## 2015-10-29 ENCOUNTER — Ambulatory Visit: Payer: No Typology Code available for payment source | Attending: Internal Medicine | Admitting: Physical Therapy

## 2015-12-01 ENCOUNTER — Ambulatory Visit: Payer: No Typology Code available for payment source | Admitting: Physical Therapy

## 2015-12-04 ENCOUNTER — Ambulatory Visit: Payer: No Typology Code available for payment source | Attending: Internal Medicine | Admitting: Physical Therapy

## 2016-04-18 ENCOUNTER — Encounter (HOSPITAL_COMMUNITY): Payer: Self-pay | Admitting: Emergency Medicine

## 2016-04-18 ENCOUNTER — Emergency Department (HOSPITAL_COMMUNITY): Payer: Medicaid Other

## 2016-04-18 ENCOUNTER — Inpatient Hospital Stay (HOSPITAL_COMMUNITY)
Admission: EM | Admit: 2016-04-18 | Discharge: 2016-04-22 | DRG: 059 | Payer: Medicaid Other | Attending: Internal Medicine | Admitting: Internal Medicine

## 2016-04-18 DIAGNOSIS — Z915 Personal history of self-harm: Secondary | ICD-10-CM

## 2016-04-18 DIAGNOSIS — E119 Type 2 diabetes mellitus without complications: Secondary | ICD-10-CM

## 2016-04-18 DIAGNOSIS — G40909 Epilepsy, unspecified, not intractable, without status epilepticus: Secondary | ICD-10-CM | POA: Diagnosis present

## 2016-04-18 DIAGNOSIS — R278 Other lack of coordination: Secondary | ICD-10-CM | POA: Diagnosis present

## 2016-04-18 DIAGNOSIS — R29898 Other symptoms and signs involving the musculoskeletal system: Secondary | ICD-10-CM

## 2016-04-18 DIAGNOSIS — D72828 Other elevated white blood cell count: Secondary | ICD-10-CM | POA: Diagnosis present

## 2016-04-18 DIAGNOSIS — E1165 Type 2 diabetes mellitus with hyperglycemia: Secondary | ICD-10-CM | POA: Diagnosis present

## 2016-04-18 DIAGNOSIS — Z833 Family history of diabetes mellitus: Secondary | ICD-10-CM

## 2016-04-18 DIAGNOSIS — Z9889 Other specified postprocedural states: Secondary | ICD-10-CM

## 2016-04-18 DIAGNOSIS — R269 Unspecified abnormalities of gait and mobility: Secondary | ICD-10-CM | POA: Diagnosis present

## 2016-04-18 DIAGNOSIS — F259 Schizoaffective disorder, unspecified: Secondary | ICD-10-CM | POA: Diagnosis present

## 2016-04-18 DIAGNOSIS — Z9151 Personal history of suicidal behavior: Secondary | ICD-10-CM

## 2016-04-18 DIAGNOSIS — Z79899 Other long term (current) drug therapy: Secondary | ICD-10-CM

## 2016-04-18 DIAGNOSIS — I10 Essential (primary) hypertension: Secondary | ICD-10-CM | POA: Diagnosis present

## 2016-04-18 DIAGNOSIS — S069X9A Unspecified intracranial injury with loss of consciousness of unspecified duration, initial encounter: Secondary | ICD-10-CM | POA: Diagnosis present

## 2016-04-18 DIAGNOSIS — Z888 Allergy status to other drugs, medicaments and biological substances status: Secondary | ICD-10-CM

## 2016-04-18 DIAGNOSIS — H409 Unspecified glaucoma: Secondary | ICD-10-CM | POA: Diagnosis present

## 2016-04-18 DIAGNOSIS — Z87820 Personal history of traumatic brain injury: Secondary | ICD-10-CM

## 2016-04-18 DIAGNOSIS — T380X5A Adverse effect of glucocorticoids and synthetic analogues, initial encounter: Secondary | ICD-10-CM | POA: Diagnosis present

## 2016-04-18 DIAGNOSIS — S065X5S Traumatic subdural hemorrhage with loss of consciousness greater than 24 hours with return to pre-existing conscious level, sequela: Secondary | ICD-10-CM

## 2016-04-18 DIAGNOSIS — S069XAA Unspecified intracranial injury with loss of consciousness status unknown, initial encounter: Secondary | ICD-10-CM | POA: Diagnosis present

## 2016-04-18 DIAGNOSIS — D72829 Elevated white blood cell count, unspecified: Secondary | ICD-10-CM

## 2016-04-18 DIAGNOSIS — G35 Multiple sclerosis: Principal | ICD-10-CM | POA: Diagnosis present

## 2016-04-18 DIAGNOSIS — Z87891 Personal history of nicotine dependence: Secondary | ICD-10-CM

## 2016-04-18 DIAGNOSIS — R296 Repeated falls: Secondary | ICD-10-CM | POA: Diagnosis present

## 2016-04-18 DIAGNOSIS — Z993 Dependence on wheelchair: Secondary | ICD-10-CM

## 2016-04-18 DIAGNOSIS — N179 Acute kidney failure, unspecified: Secondary | ICD-10-CM

## 2016-04-18 DIAGNOSIS — W3400XA Accidental discharge from unspecified firearms or gun, initial encounter: Secondary | ICD-10-CM

## 2016-04-18 DIAGNOSIS — F209 Schizophrenia, unspecified: Secondary | ICD-10-CM

## 2016-04-18 HISTORY — DX: Multiple sclerosis: G35

## 2016-04-18 LAB — URINALYSIS, ROUTINE W REFLEX MICROSCOPIC
BILIRUBIN URINE: NEGATIVE
GLUCOSE, UA: NEGATIVE mg/dL
HGB URINE DIPSTICK: NEGATIVE
Ketones, ur: NEGATIVE mg/dL
Leukocytes, UA: NEGATIVE
Nitrite: NEGATIVE
PROTEIN: NEGATIVE mg/dL
Specific Gravity, Urine: 1.006 (ref 1.005–1.030)
pH: 6.5 (ref 5.0–8.0)

## 2016-04-18 LAB — BASIC METABOLIC PANEL
Anion gap: 7 (ref 5–15)
BUN: 12 mg/dL (ref 6–20)
CO2: 28 mmol/L (ref 22–32)
Calcium: 9.3 mg/dL (ref 8.9–10.3)
Chloride: 109 mmol/L (ref 101–111)
Creatinine, Ser: 1.03 mg/dL (ref 0.61–1.24)
GFR calc Af Amer: >60 mL/min (ref >60)
GLUCOSE: 87 mg/dL (ref 65–99)
Potassium: 4 mmol/L (ref 3.5–5.1)
Sodium: 144 mmol/L (ref 135–145)

## 2016-04-18 LAB — CBC WITH DIFFERENTIAL/PLATELET
BASOS ABS: 0 10*3/uL (ref 0.0–0.1)
Basophils Relative: 0 %
EOS ABS: 0.3 10*3/uL (ref 0.0–0.7)
EOS PCT: 3 %
HCT: 46.2 % (ref 39.0–52.0)
Hemoglobin: 14.9 g/dL (ref 13.0–17.0)
LYMPHS PCT: 30 %
Lymphs Abs: 2.3 10*3/uL (ref 0.7–4.0)
MCH: 26.2 pg (ref 26.0–34.0)
MCHC: 32.3 g/dL (ref 30.0–36.0)
MCV: 81.3 fL (ref 78.0–100.0)
Monocytes Absolute: 0.5 10*3/uL (ref 0.1–1.0)
Monocytes Relative: 7 %
Neutro Abs: 4.6 10*3/uL (ref 1.7–7.7)
Neutrophils Relative %: 60 %
Platelets: 157 10*3/uL (ref 150–400)
RBC: 5.68 MIL/uL (ref 4.22–5.81)
RDW: 13.5 % (ref 11.5–15.5)
WBC: 7.7 10*3/uL (ref 4.0–10.5)

## 2016-04-18 LAB — CBG MONITORING, ED: Glucose-Capillary: 81 mg/dL (ref 65–99)

## 2016-04-18 MED ORDER — GADOBENATE DIMEGLUMINE 529 MG/ML IV SOLN
17.0000 mL | Freq: Once | INTRAVENOUS | Status: AC | PRN
  Administered 2016-04-18: 17 mL via INTRAVENOUS

## 2016-04-18 NOTE — ED Notes (Signed)
Received a phone call from MRI ( in reference to a contrast infiltration) Megan explained the pt moved and the full 20 ml was not used. 17 was used. She said she stopped the procedure, and notified the myself. It was recommended I utilize an ice pack. I told Aundra Millet I would let the provider know and for her to document everything that happened.  I spoke with Aundra Millet twice to make sure she documented the occurrence in the progress notes, and to let her know the provider ( doctor) was informed. I was told to place a warm compress on the area. Pt returned to the floor, I performed my skin assessment of the left arm and documented and placed warm contrast to the area. Multiple providers were notified of the occurrence.

## 2016-04-18 NOTE — Progress Notes (Signed)
At apx 21:30 pt was injected with multihance contrast. A 23 g butterfly needle was used. Injection was going in fine until pt suddenly shifted arm and a small amount of contrast infiltrated.  Injection was immediatly stopped.  The full dose of 20 ml was not administered, pt only received 17ml before infiltration.  Pt stated it did not hurt right after infiltration.  RN Nicola Girt(Johnathan) was called and made aware of the situation and given instructions to use an ice pack on arm if needed and to speak with a doctor or radiologist if further assistance is needed.

## 2016-04-18 NOTE — ED Provider Notes (Signed)
5:49 PM Care assumed from Dr. Lynelle Doctor.   At time of signout, patient is awaiting MRI of the brain to look for etiology of his difficulty walking. If imaging is abnormal, plan to consult neurology for further management. If scan is more, will likely discharge with neurologic follow-up.  MRIreturned showing concern for demyelinating process versus infectious process.   Neurology was called and they requested contrasted MRI scan. These were ordered.  Nursing reported that while getting his scan, some contrast extravasated into his extremity. Patient denied any symptoms similar denying any pain, no swelling, tenderness, or swelling. Patient was instructed to put warm compress on extremity.  Patient will be admitted and transferred to [facility] for further hospitalist and neurology management. Neurology came to see the patient and documentation was filled out for patient's transfer.   Canary Brim Mande Auvil, MD 04/19/16 [phone]

## 2016-04-18 NOTE — ED Notes (Signed)
In MRI

## 2016-04-18 NOTE — ED Notes (Addendum)
Patient is accompanied today by his uncle who is his primary care giver. [name] resides with his uncle. The uncle is concerned that he has noticed increased weakness in his right leg over the past 2 days that has resulted in falls x2 and decreased independence. His uncle has also noticed that he has been urinating in the bed which is unusual for him.  He feels that [name] is weak but has no changes in appetite, drinking normally. He has not noticed him complaining of pain, fever, chills.

## 2016-04-18 NOTE — ED Notes (Signed)
Was able to stand at bed side. Not able to walk. Right leg is very weak and shaky.

## 2016-04-18 NOTE — ED Triage Notes (Signed)
Pt arrives from home where he resides with his uncle. Pt has hx TBI and uses walker. Pt has increased weakness over the past few days resulting in falls. EMS reports no neuro deficits or pain at this time.

## 2016-04-18 NOTE — ED Notes (Signed)
Meal Tray given. Pt required assistance with set-up.

## 2016-04-18 NOTE — ED Notes (Signed)
Bed: [facility] Expected date:  Expected time:  Means of arrival:  Comments: 

## 2016-04-18 NOTE — ED Notes (Signed)
Patient seen trying to crawl out of bed. When asked if he could be helped he responded 'I need to get my flip flop.' I reached for his flip flop to which he also moved further out of the bed. I asked him to move back up in to the bed as he was very close to the bottom edge and I felt there was a fall risk. I also radioed for assistance. The patient did not move back but rather continued to move forward repeating 'I need my flip flop.' At this point patient was more standing than sitting with me standing in front of him. He became unsteady and I was unable to support his weight or move him back in to the bed. No assistance had come yet and I assisted him to a sitting position on the floor until help arrived to put him back in the bed. Patient denies any pain. Dr. Lynelle Doctor notified of occurrence and followed up for assessment. [place], CN notified of occurrence and stated that fall huddle did not need to occur.
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
## 2016-04-18 NOTE — ED Notes (Signed)
hospitalist in room  

## 2016-04-18 NOTE — ED Notes (Signed)
Patient's family at bedside. Informed by Dr. Lynelle Doctor that he recommends MRI for further work-up. Patient's uncle consented to procedure. Details explained to patient who also consented.

## 2016-04-19 ENCOUNTER — Encounter (HOSPITAL_COMMUNITY): Payer: Self-pay | Admitting: Internal Medicine

## 2016-04-19 DIAGNOSIS — E1165 Type 2 diabetes mellitus with hyperglycemia: Secondary | ICD-10-CM | POA: Diagnosis present

## 2016-04-19 DIAGNOSIS — G35 Multiple sclerosis: Secondary | ICD-10-CM | POA: Diagnosis not present

## 2016-04-19 DIAGNOSIS — D62 Acute posthemorrhagic anemia: Secondary | ICD-10-CM | POA: Diagnosis not present

## 2016-04-19 DIAGNOSIS — Z79899 Other long term (current) drug therapy: Secondary | ICD-10-CM | POA: Diagnosis not present

## 2016-04-19 DIAGNOSIS — D72828 Other elevated white blood cell count: Secondary | ICD-10-CM | POA: Diagnosis present

## 2016-04-19 DIAGNOSIS — Z87891 Personal history of nicotine dependence: Secondary | ICD-10-CM | POA: Diagnosis not present

## 2016-04-19 DIAGNOSIS — Z9889 Other specified postprocedural states: Secondary | ICD-10-CM | POA: Diagnosis not present

## 2016-04-19 DIAGNOSIS — Z915 Personal history of self-harm: Secondary | ICD-10-CM | POA: Diagnosis not present

## 2016-04-19 DIAGNOSIS — G40909 Epilepsy, unspecified, not intractable, without status epilepticus: Secondary | ICD-10-CM | POA: Diagnosis present

## 2016-04-19 DIAGNOSIS — N179 Acute kidney failure, unspecified: Secondary | ICD-10-CM | POA: Diagnosis not present

## 2016-04-19 DIAGNOSIS — R29898 Other symptoms and signs involving the musculoskeletal system: Secondary | ICD-10-CM | POA: Diagnosis not present

## 2016-04-19 DIAGNOSIS — H409 Unspecified glaucoma: Secondary | ICD-10-CM | POA: Diagnosis present

## 2016-04-19 DIAGNOSIS — Z888 Allergy status to other drugs, medicaments and biological substances status: Secondary | ICD-10-CM | POA: Diagnosis not present

## 2016-04-19 DIAGNOSIS — R269 Unspecified abnormalities of gait and mobility: Secondary | ICD-10-CM | POA: Diagnosis present

## 2016-04-19 DIAGNOSIS — S069X9S Unspecified intracranial injury with loss of consciousness of unspecified duration, sequela: Secondary | ICD-10-CM | POA: Diagnosis not present

## 2016-04-19 DIAGNOSIS — R296 Repeated falls: Secondary | ICD-10-CM | POA: Diagnosis present

## 2016-04-19 DIAGNOSIS — F259 Schizoaffective disorder, unspecified: Secondary | ICD-10-CM | POA: Diagnosis present

## 2016-04-19 DIAGNOSIS — Z9189 Other specified personal risk factors, not elsewhere classified: Secondary | ICD-10-CM | POA: Diagnosis not present

## 2016-04-19 DIAGNOSIS — Z993 Dependence on wheelchair: Secondary | ICD-10-CM | POA: Diagnosis not present

## 2016-04-19 DIAGNOSIS — S069X0S Unspecified intracranial injury without loss of consciousness, sequela: Secondary | ICD-10-CM

## 2016-04-19 DIAGNOSIS — T380X5A Adverse effect of glucocorticoids and synthetic analogues, initial encounter: Secondary | ICD-10-CM | POA: Diagnosis present

## 2016-04-19 DIAGNOSIS — Z833 Family history of diabetes mellitus: Secondary | ICD-10-CM | POA: Diagnosis not present

## 2016-04-19 DIAGNOSIS — Z87820 Personal history of traumatic brain injury: Secondary | ICD-10-CM | POA: Diagnosis not present

## 2016-04-19 DIAGNOSIS — M6281 Muscle weakness (generalized): Secondary | ICD-10-CM | POA: Diagnosis not present

## 2016-04-19 DIAGNOSIS — I10 Essential (primary) hypertension: Secondary | ICD-10-CM | POA: Diagnosis present

## 2016-04-19 LAB — CK: CK TOTAL: 127 U/L (ref 49–397)

## 2016-04-19 LAB — VALPROIC ACID LEVEL: VALPROIC ACID LVL: 91 ug/mL (ref 50.0–100.0)

## 2016-04-19 LAB — MRSA PCR SCREENING: MRSA BY PCR: NEGATIVE

## 2016-04-19 MED ORDER — ACETAMINOPHEN 650 MG RE SUPP
650.0000 mg | Freq: Four times a day (QID) | RECTAL | Status: DC | PRN
Start: 2016-04-19 — End: 2016-04-22

## 2016-04-19 MED ORDER — SODIUM CHLORIDE 0.9 % IV SOLN
500.0000 mg | Freq: Two times a day (BID) | INTRAVENOUS | Status: AC
  Administered 2016-04-19 – 2016-04-21 (×6): 500 mg via INTRAVENOUS
  Filled 2016-04-19 (×8): qty 4

## 2016-04-19 MED ORDER — ONDANSETRON HCL 4 MG/2ML IJ SOLN: 4.0000 mg | Freq: Four times a day (QID) | INTRAMUSCULAR | Status: DC | PRN

## 2016-04-19 MED ORDER — SODIUM CHLORIDE 0.9 % IV SOLN
INTRAVENOUS | Status: AC
  Administered 2016-04-19: 02:00:00 via INTRAVENOUS

## 2016-04-19 MED ORDER — PANTOPRAZOLE SODIUM 40 MG PO TBEC
40.0000 mg | DELAYED_RELEASE_TABLET | Freq: Every day | ORAL | Status: DC
  Administered 2016-04-19 – 2016-04-22 (×4): 40 mg via ORAL
  Filled 2016-04-19 (×5): qty 1

## 2016-04-19 MED ORDER — ONDANSETRON HCL 4 MG PO TABS: 4.0000 mg | ORAL_TABLET | Freq: Four times a day (QID) | ORAL | Status: DC | PRN

## 2016-04-19 MED ORDER — DIVALPROEX SODIUM 500 MG PO DR TAB
500.0000 mg | DELAYED_RELEASE_TABLET | Freq: Two times a day (BID) | ORAL | Status: DC
  Administered 2016-04-19 – 2016-04-22 (×8): 500 mg via ORAL
  Filled 2016-04-19 (×8): qty 1

## 2016-04-19 MED ORDER — ESCITALOPRAM OXALATE 10 MG PO TABS
10.0000 mg | ORAL_TABLET | Freq: Every day | ORAL | Status: DC
  Administered 2016-04-19 – 2016-04-21 (×4): 10 mg via ORAL
  Filled 2016-04-19 (×4): qty 1

## 2016-04-19 MED ORDER — PROPRANOLOL HCL 20 MG PO TABS
20.0000 mg | ORAL_TABLET | Freq: Four times a day (QID) | ORAL | Status: DC
  Administered 2016-04-19 – 2016-04-22 (×12): 20 mg via ORAL
  Filled 2016-04-19 (×15): qty 1
  Filled 2016-04-19: qty 2
  Filled 2016-04-19 (×3): qty 1

## 2016-04-19 MED ORDER — ACETAMINOPHEN 325 MG PO TABS
650.0000 mg | ORAL_TABLET | Freq: Four times a day (QID) | ORAL | Status: DC | PRN
  Administered 2016-04-20: 650 mg via ORAL
  Filled 2016-04-19: qty 2

## 2016-04-19 MED ORDER — LEVETIRACETAM 500 MG PO TABS
500.0000 mg | ORAL_TABLET | Freq: Two times a day (BID) | ORAL | Status: DC
  Administered 2016-04-19 – 2016-04-22 (×8): 500 mg via ORAL
  Filled 2016-04-19 (×8): qty 1

## 2016-04-19 MED ORDER — HYDRALAZINE HCL 20 MG/ML IJ SOLN: 10.0000 mg | INTRAMUSCULAR | Status: DC | PRN

## 2016-04-19 NOTE — Progress Notes (Signed)
Patient arrived to 5M20 AAOx2. Vitals were taken. Pt keeps repeating that he had "surgery on his right leg and they had to cut the middle of my stomach open." Monitoring closely, bed alarm explained several times and call bell is at his side. Caylen Yardley, [place] Scrape, RN
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
## 2016-04-19 NOTE — H&P (Signed)
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 motion degraded examination limits assessment . Sub cm chronic demyelinating plaque at T2. No definite acute inflammation. Lesions may be undetected due to motion. Otherwise negative of the thoracic spine. Electronically Signed: By: Awilda Metro M.D. On: 04/18/2016 23:48   Dg Hip Unilat W Or Wo Pelvis 2-3 Views Right  Result Date: 04/18/2016 CLINICAL DATA:  Initial encounter for Per home caregiver pt has been having increased weakness in rt lower extremity over last few days causing multiple falls, pt not complaining of any pain, EXAM: DG HIP (WITH OR WITHOUT PELVIS) 2-3V RIGHT COMPARISON:  None. FINDINGS: Femoral heads are located. Sacroiliac joints are symmetric. No acute fracture. IMPRESSION: No supraclavicular adenopathy. Electronically Signed   By: Jeronimo Greaves M.D.   On: 04/18/2016 12:06     Assessment/Plan Principal Problem:   Multiple sclerosis (HCC) Active Problems:   TBI (traumatic brain injury) (HCC)   Coordination abnormal    1. Acute demyelinating lesion most likely multiple sclerosis - I have discussed with Dr. Amada Jupiter on call neurologist. Neurologist has advised to start patient on Solu-Medrol 500 mg every 12. Patient will be transferred to [facility] for further management. Dr. Maryfrances Bunnell will be the accepting physician. Patient likely will need lumbar puncture. 2. Elevated blood pressure - patient's charts mention history of hypertension. Patient is presently not on any antihypertensives. For now I have placed patient on when necessary IV hydralazine. Closely follow blood pressure trends. 3. History of traumatic brain injury - patient is on Keppra and Depakote. Check Depakote levels.   DVT prophylaxis: SCDs. Possible lumbar puncture anticipated further workup for multiple sclerosis. Code Status: Full code.  Family Communication: Patient's uncle Mr. Almeta Monas who is the legal guardian.  Disposition Plan: To be determined.  Consults called: Neurology. Physical therapy.  Admission status: Inpatient. Likely stay 2-3 days.    Eduard Clos MD [facility] Pager [phone].  If 7PM-7AM, please contact night-coverage [email or web address] Password [facility]  04/19/2016, 12:49 AM
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
## 2016-04-20 ENCOUNTER — Encounter (HOSPITAL_COMMUNITY): Payer: Self-pay | Admitting: Internal Medicine

## 2016-04-20 DIAGNOSIS — N179 Acute kidney failure, unspecified: Secondary | ICD-10-CM

## 2016-04-20 DIAGNOSIS — Z9189 Other specified personal risk factors, not elsewhere classified: Secondary | ICD-10-CM

## 2016-04-20 DIAGNOSIS — F259 Schizoaffective disorder, unspecified: Secondary | ICD-10-CM

## 2016-04-20 DIAGNOSIS — R29898 Other symptoms and signs involving the musculoskeletal system: Secondary | ICD-10-CM

## 2016-04-20 DIAGNOSIS — Z87820 Personal history of traumatic brain injury: Secondary | ICD-10-CM

## 2016-04-20 DIAGNOSIS — Z915 Personal history of self-harm: Secondary | ICD-10-CM

## 2016-04-20 DIAGNOSIS — S069X9S Unspecified intracranial injury with loss of consciousness of unspecified duration, sequela: Secondary | ICD-10-CM

## 2016-04-20 DIAGNOSIS — Z9151 Personal history of suicidal behavior: Secondary | ICD-10-CM

## 2016-04-20 DIAGNOSIS — D72829 Elevated white blood cell count, unspecified: Secondary | ICD-10-CM

## 2016-04-20 DIAGNOSIS — I10 Essential (primary) hypertension: Secondary | ICD-10-CM

## 2016-04-20 DIAGNOSIS — G35 Multiple sclerosis: Principal | ICD-10-CM

## 2016-04-20 LAB — COMPREHENSIVE METABOLIC PANEL
ALBUMIN: 3.5 g/dL (ref 3.5–5.0)
ALK PHOS: 48 U/L (ref 38–126)
ALT: 18 U/L (ref 17–63)
ANION GAP: 11 (ref 5–15)
AST: 27 U/L (ref 15–41)
BUN: 9 mg/dL (ref 6–20)
CALCIUM: 9.1 mg/dL (ref 8.9–10.3)
CHLORIDE: 110 mmol/L (ref 101–111)
CO2: 21 mmol/L — AB (ref 22–32)
Creatinine, Ser: 1.18 mg/dL (ref 0.61–1.24)
GFR calc non Af Amer: >60 mL/min (ref >60)
Glucose, Bld: 193 mg/dL — ABNORMAL HIGH (ref 65–99)
POTASSIUM: 4.1 mmol/L (ref 3.5–5.1)
SODIUM: 142 mmol/L (ref 135–145)
Total Bilirubin: 0.4 mg/dL (ref 0.3–1.2)
Total Protein: 6.7 g/dL (ref 6.5–8.1)

## 2016-04-20 LAB — HIV ANTIBODY (ROUTINE TESTING W REFLEX): HIV SCREEN 4TH GENERATION: NONREACTIVE

## 2016-04-20 LAB — MAGNESIUM: Magnesium: 1.6 mg/dL — ABNORMAL LOW (ref 1.7–2.4)

## 2016-04-20 LAB — CBC WITH DIFFERENTIAL/PLATELET
BASOS ABS: 0 10*3/uL (ref 0.0–0.1)
BASOS PCT: 0 %
EOS ABS: 0 10*3/uL (ref 0.0–0.7)
Eosinophils Relative: 0 %
HCT: 46.7 % (ref 39.0–52.0)
Hemoglobin: 14.9 g/dL (ref 13.0–17.0)
LYMPHS ABS: 1.8 10*3/uL (ref 0.7–4.0)
Lymphocytes Relative: 7 %
MCH: 26.6 pg (ref 26.0–34.0)
MCHC: 31.9 g/dL (ref 30.0–36.0)
MCV: 83.4 fL (ref 78.0–100.0)
MONO ABS: 0.5 10*3/uL (ref 0.1–1.0)
Monocytes Relative: 2 %
NEUTROS ABS: 23.3 10*3/uL — AB (ref 1.7–7.7)
Neutrophils Relative %: 91 %
PLATELETS: 161 10*3/uL (ref 150–400)
RBC: 5.6 MIL/uL (ref 4.22–5.81)
RDW: 13.4 % (ref 11.5–15.5)
WBC: 25.6 10*3/uL — ABNORMAL HIGH (ref 4.0–10.5)

## 2016-04-20 LAB — PHOSPHORUS: PHOSPHORUS: 2.4 mg/dL — AB (ref 2.5–4.6)

## 2016-04-20 LAB — NEUROMYELITIS OPTICA AUTOAB, IGG: NMO-IgG: 1.7 U/mL (ref 0.0–3.0)

## 2016-04-20 LAB — GLUCOSE, CAPILLARY
GLUCOSE-CAPILLARY: 154 mg/dL — AB (ref 65–99)
GLUCOSE-CAPILLARY: 179 mg/dL — AB (ref 65–99)

## 2016-04-20 LAB — ANTINUCLEAR ANTIBODIES, IFA: ANTINUCLEAR ANTIBODIES, IFA: NEGATIVE

## 2016-04-20 LAB — RPR: RPR: NONREACTIVE

## 2016-04-20 MED ORDER — INSULIN ASPART 100 UNIT/ML ~~LOC~~ SOLN
0.0000 [IU] | Freq: Three times a day (TID) | SUBCUTANEOUS | Status: DC
  Administered 2016-04-20: 2 [IU] via SUBCUTANEOUS
  Administered 2016-04-21: 1 [IU] via SUBCUTANEOUS
  Administered 2016-04-21: 2 [IU] via SUBCUTANEOUS
  Administered 2016-04-21 – 2016-04-22 (×2): 1 [IU] via SUBCUTANEOUS

## 2016-04-20 MED ORDER — MAGNESIUM SULFATE 2 GM/50ML IV SOLN
2.0000 g | Freq: Once | INTRAVENOUS | Status: AC
  Administered 2016-04-20: 2 g via INTRAVENOUS
  Filled 2016-04-20: qty 50

## 2016-04-20 NOTE — Progress Notes (Signed)
Neurology Progress Note  Subjective: The patient reports he is doing well. He still has difficulty walking and does not feel that this has changed much yet. He denies any new neurologic symptoms. He is tolerating high dose Solumedrol without difficulty, denies indigestion, insomnia, or mood changes. He is eating well. ROS otherwise negative.   Current Meds:   Current Facility-Administered Medications:  .  acetaminophen (TYLENOL) tablet 650 mg, 650 mg, Oral, Q6H PRN, 650 mg at 04/20/16 0844 **OR** acetaminophen (TYLENOL) suppository 650 mg, 650 mg, Rectal, Q6H PRN, Eduard ClosArshad N Kakrakandy, MD .  divalproex (DEPAKOTE) DR tablet 500 mg, 500 mg, Oral, BID, Eduard ClosArshad N Kakrakandy, MD, 500 mg at 04/20/16 0847 .  escitalopram (LEXAPRO) tablet 10 mg, 10 mg, Oral, QHS, Eduard ClosArshad N Kakrakandy, MD, 10 mg at 04/19/16 2217 .  hydrALAZINE (APRESOLINE) injection 10 mg, 10 mg, Intravenous, Q4H PRN, Eduard ClosArshad N Kakrakandy, MD .  levETIRAcetam (KEPPRA) tablet 500 mg, 500 mg, Oral, BID, Eduard ClosArshad N Kakrakandy, MD, 500 mg at 04/20/16 0845 .  methylPREDNISolone sodium succinate (SOLU-MEDROL) 500 mg in sodium chloride 0.9 % 50 mL IVPB, 500 mg, Intravenous, Q12H, Eduard ClosArshad N Kakrakandy, MD, 500 mg at 04/20/16 1328 .  ondansetron (ZOFRAN) tablet 4 mg, 4 mg, Oral, Q6H PRN **OR** ondansetron (ZOFRAN) injection 4 mg, 4 mg, Intravenous, Q6H PRN, Eduard ClosArshad N Kakrakandy, MD .  pantoprazole (PROTONIX) EC tablet 40 mg, 40 mg, Oral, Daily, Eduard ClosArshad N Kakrakandy, MD, 40 mg at 04/20/16 0845 .  propranolol (INDERAL) tablet 20 mg, 20 mg, Oral, Q6H, Eduard ClosArshad N Kakrakandy, MD, 20 mg at 04/20/16 1203  Objective:  Temp:  [97.3 F (36.3 C)-98.8 F (37.1 C)] 97.6 F (36.4 C) (09/27 1413) Pulse Rate:  [60-100] 60 (09/27 1413) Resp:  [17-18] 17 (09/27 1413) BP: (112-143)/(68-90) 143/74 (09/27 1413) SpO2:  [96 %-99 %] 99 % (09/27 1239)  General: WDWN male sitting up in chair in NAD. Alert, oriented x4. Speech is clear without dysarthria. Affect is bright.  Comportment is normal. He is mildly perseverative.  HEENT: Neck is supple without lymphadenopathy. Mucous membranes are moist and the oropharynx is clear. Sclerae are anicteric. There is no conjunctival injection.  CV: Regular, no murmur. Carotid pulses are 2+ and symmetric with no bruits. Distal pulses 2+ and symmetric.  Lungs: CTAB  Extremities: No C/C/E. Neuro: MS: As noted above. No aphasia.  CN: Pupils are equal and reactive from 3-->2 mm bilaterally. EOMI with breakup of smooth pursuits, no nystagmus. Facial sensation is intact to light touch. There is some flattening of the left nasolabial fold with normal strength and mobility. Hearing is intact to conversational voice. Voice is normal in tone and quality. Palate elevates symmetrically. Uvula is midline. Bilateral SCM and trapezii are 5/5. Tongue is midline with normal bulk and mobility.  Motor: Normal bulk, tone, and strength throughout with the exception of 4+/5 grip bilaterally; this seemed effort-dependent. No pronator drift. No tremor or other abnormal movements are observed.  Sensation: Intact to light touch.  DTRs: 3+, symmetric. Toes are downgoing bilaterally. No pathological reflexes.  Coordination: Finger-to-nose is slow and deliberate but without dysmetria bilaterally.   Labs: Lab Results  Component Value Date   WBC 25.6 (H) 04/20/2016   HGB 14.9 04/20/2016   HCT 46.7 04/20/2016   PLT 161 04/20/2016   GLUCOSE 193 (H) 04/20/2016   TRIG 109 01/02/2014   ALT 18 04/20/2016   AST 27 04/20/2016   NA 142 04/20/2016   K 4.1 04/20/2016   CL 110 04/20/2016   CREATININE  1.18 04/20/2016   BUN 9 04/20/2016   CO2 21 (L) 04/20/2016   INR 1.59 (H) 12/31/2013   HGBA1C 5.2 09/27/2013   CBC Latest Ref Rng & Units 04/20/2016 04/18/2016 06/03/2014  WBC 4.0 - 10.5 K/uL 25.6(H) 7.7 5.2  Hemoglobin 13.0 - 17.0 g/dL 96.2 83.6 62.9  Hematocrit 39.0 - 52.0 % 46.7 46.2 46.6  Platelets 150 - 400 K/uL 161 157 165    Lab Results  Component  Value Date   HGBA1C 5.2 09/27/2013   Lab Results  Component Value Date   ALT 18 04/20/2016   AST 27 04/20/2016   ALKPHOS 48 04/20/2016   BILITOT 0.4 04/20/2016   Mg++ 1.6 Phos  2.4 RPR nonreactive HIV nonreactive ANA negative NMO antibodies negative   A/P:   1. Multiple sclerosis: This represents a new diagnosis with involvement of both brain and spinal cord. Continue high-dose IV Solumedrol 500 mg BID for three days. Thus far MS mimics are negative. Will add serum ACE. MRI satisfies McDonald criteria so no need for LP at this time. He will need to be set up with outpatient neuro follow up after discharge to initiate disease modifying therapy.   2. Abnormality of gait: This is acute-on-chronic. He has some chronic gait changes related to remote TBI but these worsened more acutely, likely due to MS lesions involving the spinal cord. Steroids as above. PT/rehab.   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   No family present at the bedside. I will continue to follow.   Rhona Leavens, MD [facility]
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
## 2016-04-20 NOTE — Progress Notes (Signed)
Rehab Admissions Coordinator Note:  Patient was screened by Trish Mage for appropriateness for an Inpatient Acute Rehab Consult.  At this time, we are recommending Inpatient Rehab consult.  Trish Mage 04/20/2016, 11:21 AM  I can be reached at [phone].
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
## 2016-04-20 NOTE — Progress Notes (Signed)
Attending MD notified of WBC increase.  No new orders at this time. Patient is receiving solumedrol BID.

## 2016-04-21 LAB — COMPREHENSIVE METABOLIC PANEL
ALT: 19 U/L (ref 17–63)
AST: 20 U/L (ref 15–41)
Albumin: 3.2 g/dL — ABNORMAL LOW (ref 3.5–5.0)
Alkaline Phosphatase: 48 U/L (ref 38–126)
Anion gap: 6 (ref 5–15)
BUN: 10 mg/dL (ref 6–20)
CHLORIDE: 109 mmol/L (ref 101–111)
CO2: 27 mmol/L (ref 22–32)
CREATININE: 1 mg/dL (ref 0.61–1.24)
Calcium: 8.6 mg/dL — ABNORMAL LOW (ref 8.9–10.3)
Glucose, Bld: 158 mg/dL — ABNORMAL HIGH (ref 65–99)
POTASSIUM: 3.9 mmol/L (ref 3.5–5.1)
SODIUM: 142 mmol/L (ref 135–145)
Total Bilirubin: 0.4 mg/dL (ref 0.3–1.2)
Total Protein: 6.3 g/dL — ABNORMAL LOW (ref 6.5–8.1)

## 2016-04-21 LAB — GLUCOSE, CAPILLARY
GLUCOSE-CAPILLARY: 146 mg/dL — AB (ref 65–99)
Glucose-Capillary: 162 mg/dL — ABNORMAL HIGH (ref 65–99)
Glucose-Capillary: 185 mg/dL — ABNORMAL HIGH (ref 65–99)
Glucose-Capillary: 177 mg/dL — ABNORMAL HIGH (ref 65–99)

## 2016-04-21 LAB — CBC WITH DIFFERENTIAL/PLATELET
BASOS ABS: 0 10*3/uL (ref 0.0–0.1)
Basophils Relative: 0 %
EOS ABS: 0 10*3/uL (ref 0.0–0.7)
EOS PCT: 0 %
HCT: 43.4 % (ref 39.0–52.0)
Hemoglobin: 13.9 g/dL (ref 13.0–17.0)
LYMPHS ABS: 1.5 10*3/uL (ref 0.7–4.0)
Lymphocytes Relative: 6 %
MCH: 26.5 pg (ref 26.0–34.0)
MCHC: 32 g/dL (ref 30.0–36.0)
MCV: 82.8 fL (ref 78.0–100.0)
MONO ABS: 0.8 10*3/uL (ref 0.1–1.0)
Monocytes Relative: 3 %
Neutro Abs: 21.4 10*3/uL — ABNORMAL HIGH (ref 1.7–7.7)
Neutrophils Relative %: 91 %
PLATELETS: 154 10*3/uL (ref 150–400)
RBC: 5.24 MIL/uL (ref 4.22–5.81)
RDW: 13.5 % (ref 11.5–15.5)
WBC: 23.7 10*3/uL — AB (ref 4.0–10.5)

## 2016-04-21 LAB — MAGNESIUM: MAGNESIUM: 2.1 mg/dL (ref 1.7–2.4)

## 2016-04-21 LAB — PHOSPHORUS: PHOSPHORUS: 2.5 mg/dL (ref 2.5–4.6)

## 2016-04-21 NOTE — Progress Notes (Signed)
Neurology Progress Note  Subjective: The patient just finished eating breakfast and feels well this morning. He wants to know if he can take his walker and start walking up and down the hallways. He still notes difficulty with his gait but has no new neurologic symptoms. He is not having any apparent ill effects from the IV steroids. ROS otherwise negative.   Current Meds:   Current Facility-Administered Medications:  .  acetaminophen (TYLENOL) tablet 650 mg, 650 mg, Oral, Q6H PRN, 650 mg at 04/20/16 0844 **OR** acetaminophen (TYLENOL) suppository 650 mg, 650 mg, Rectal, Q6H PRN, Eduard Clos, MD .  divalproex (DEPAKOTE) DR tablet 500 mg, 500 mg, Oral, BID, Eduard Clos, MD, 500 mg at 04/21/16 0846 .  escitalopram (LEXAPRO) tablet 10 mg, 10 mg, Oral, QHS, Eduard Clos, MD, 10 mg at 04/20/16 2130 .  hydrALAZINE (APRESOLINE) injection 10 mg, 10 mg, Intravenous, Q4H PRN, Eduard Clos, MD .  insulin aspart (novoLOG) injection 0-9 Units, 0-9 Units, Subcutaneous, TID WC, Omair Latif Sheikh, DO, 1 Units at 04/21/16 0900 .  levETIRAcetam (KEPPRA) tablet 500 mg, 500 mg, Oral, BID, Eduard Clos, MD, 500 mg at 04/21/16 0846 .  methylPREDNISolone sodium succinate (SOLU-MEDROL) 500 mg in sodium chloride 0.9 % 50 mL IVPB, 500 mg, Intravenous, Q12H, Eduard Clos, MD, 500 mg at 04/21/16 0001 .  ondansetron (ZOFRAN) tablet 4 mg, 4 mg, Oral, Q6H PRN **OR** ondansetron (ZOFRAN) injection 4 mg, 4 mg, Intravenous, Q6H PRN, Eduard Clos, MD .  pantoprazole (PROTONIX) EC tablet 40 mg, 40 mg, Oral, Daily, Eduard Clos, MD, 40 mg at 04/21/16 0846 .  propranolol (INDERAL) tablet 20 mg, 20 mg, Oral, Q6H, Eduard Clos, MD, 20 mg at 04/21/16 0544  Objective:  Temp:  [97.4 F (36.3 C)-98.1 F (36.7 C)] 98.1 F (36.7 C) (09/28 0607) Pulse Rate:  [57-82] 57 (09/28 0607) Resp:  [17-22] 20 (09/28 0607) BP: (124-158)/(67-90) 158/78 (09/28 0607) SpO2:  [94 %-99 %]  96 % (09/28 0607)  General: WDWN male sitting up in chair in NAD. Alert, oriented x4. Speech is clear without dysarthria. Affect is bright. Comportment is normal. He is mildly perseverative with slightly delayed speed of processing at times.  HEENT: Neck is supple without lymphadenopathy. Mucous membranes are moist and the oropharynx is clear. Sclerae are anicteric. There is no conjunctival injection.  CV: Regular, no murmur. Carotid pulses are 2+ and symmetric with no bruits. Distal pulses 2+ and symmetric.  Lungs: CTAB  Extremities: No C/C/E. Neuro: MS: As noted above. No aphasia.  CN: Pupils are equal and reactive from 3-->2 mm bilaterally. EOMI with breakup of smooth pursuits, no nystagmus. Facial sensation is intact to light touch. There is some flattening of the left nasolabial fold with normal strength and mobility. Hearing is intact to conversational voice. Voice is normal in tone and quality. Palate elevates symmetrically. Uvula is midline. Bilateral SCM and trapezii are 5/5. Tongue is midline with normal bulk and mobility.  Motor: Normal bulk, tone, and strength throughout with mild grip weakness bilaterally that improves with encouragement suggesting this is due at least in part to effort.; No pronator drift. No tremor or other abnormal movements are observed.  Sensation: Intact to light touch.  DTRs: 3+, symmetric. Toes are downgoing bilaterally. No pathological reflexes.  Coordination: Finger-to-nose is slow and deliberate but without dysmetria bilaterally.   Labs: Lab Results  Component Value Date   WBC 23.7 (H) 04/21/2016   HGB 13.9 04/21/2016  HCT 43.4 04/21/2016   PLT 154 04/21/2016   GLUCOSE 158 (H) 04/21/2016   TRIG 109 01/02/2014   ALT 19 04/21/2016   AST 20 04/21/2016   NA 142 04/21/2016   K 3.9 04/21/2016   CL 109 04/21/2016   CREATININE 1.00 04/21/2016   BUN 10 04/21/2016   CO2 27 04/21/2016   INR 1.59 (H) 12/31/2013   HGBA1C 5.2 09/27/2013   CBC Latest Ref  Rng & Units 04/21/2016 04/20/2016 04/18/2016  WBC 4.0 - 10.5 K/uL 23.7(H) 25.6(H) 7.7  Hemoglobin 13.0 - 17.0 g/dL 16.113.9 09.614.9 04.514.9  Hematocrit 39.0 - 52.0 % 43.4 46.7 46.2  Platelets 150 - 400 K/uL 154 161 157    Lab Results  Component Value Date   HGBA1C 5.2 09/27/2013   Lab Results  Component Value Date   ALT 19 04/21/2016   AST 20 04/21/2016   ALKPHOS 48 04/21/2016   BILITOT 0.4 04/21/2016   Mg++ 2.1 Phos  2.5 RPR nonreactive HIV nonreactive ANA negative NMO antibodies negative ACE pending   A/P:   1. Multiple sclerosis: This represents a new diagnosis with involvement of both brain and spinal cord. Today is day 3/3 of high-dose IV Solumedrol. No need for prednisone taper. Thus far MS mimics are negative, awaiting serum ACE. MRI satisfies McDonald criteria so no need for LP at this time. He will need to be set up with outpatient neuro follow up after discharge to initiate disease modifying therapy.   2. Abnormality of gait: This is acute-on-chronic. He has some chronic gait changes related to remote TBI but these worsened more acutely, likely due to MS lesions involving the spinal cord. Steroids as above. PT/rehab.   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    Rhona Leavensimothy Desirre Eickhoff, MD [facility]
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
## 2016-04-21 NOTE — Progress Notes (Signed)
Rehab admissions - I met with patient at the bedside.  He would like to come to inpatient rehab.  I then called his uncle who confirms that he will be able to assist patient at home after rehab as he has been doing this previously.  I will try to get patient a bed on inpatient rehab tomorrow pending bed availability.  Call me for questions.  #[phone]
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
## 2016-04-22 ENCOUNTER — Inpatient Hospital Stay (HOSPITAL_COMMUNITY)
Admission: RE | Admit: 2016-04-22 | Discharge: 2016-04-29 | DRG: 059 | Disposition: A | Discharge status: Home / Self Care | Payer: Medicaid Other | Source: Intra-hospital | Attending: Physical Medicine & Rehabilitation | Admitting: Physical Medicine & Rehabilitation

## 2016-04-22 DIAGNOSIS — Z9181 History of falling: Secondary | ICD-10-CM

## 2016-04-22 DIAGNOSIS — N179 Acute kidney failure, unspecified: Secondary | ICD-10-CM

## 2016-04-22 DIAGNOSIS — Z87820 Personal history of traumatic brain injury: Secondary | ICD-10-CM

## 2016-04-22 DIAGNOSIS — G35 Multiple sclerosis: Principal | ICD-10-CM

## 2016-04-22 DIAGNOSIS — Z79899 Other long term (current) drug therapy: Secondary | ICD-10-CM | POA: Diagnosis not present

## 2016-04-22 DIAGNOSIS — Z888 Allergy status to other drugs, medicaments and biological substances status: Secondary | ICD-10-CM

## 2016-04-22 DIAGNOSIS — G40909 Epilepsy, unspecified, not intractable, without status epilepticus: Secondary | ICD-10-CM | POA: Diagnosis present

## 2016-04-22 DIAGNOSIS — F259 Schizoaffective disorder, unspecified: Secondary | ICD-10-CM | POA: Diagnosis present

## 2016-04-22 DIAGNOSIS — G35D Multiple sclerosis, unspecified: Secondary | ICD-10-CM | POA: Diagnosis present

## 2016-04-22 DIAGNOSIS — D62 Acute posthemorrhagic anemia: Secondary | ICD-10-CM

## 2016-04-22 DIAGNOSIS — T380X5A Adverse effect of glucocorticoids and synthetic analogues, initial encounter: Secondary | ICD-10-CM | POA: Diagnosis present

## 2016-04-22 LAB — HEMOGLOBIN A1C
Hgb A1c MFr Bld: 5.2 % (ref 4.8–5.6)
Mean Plasma Glucose: 103 mg/dL

## 2016-04-22 LAB — GLUCOSE, CAPILLARY
GLUCOSE-CAPILLARY: 134 mg/dL — AB (ref 65–99)
Glucose-Capillary: 116 mg/dL — ABNORMAL HIGH (ref 65–99)

## 2016-04-22 LAB — ANGIOTENSIN CONVERTING ENZYME: Angiotensin-Converting Enzyme: 38 U/L (ref 14–82)

## 2016-04-22 MED ORDER — DIVALPROEX SODIUM 500 MG PO DR TAB
500.0000 mg | DELAYED_RELEASE_TABLET | Freq: Two times a day (BID) | ORAL | Status: DC
  Administered 2016-04-22 – 2016-04-29 (×14): 500 mg via ORAL
  Filled 2016-04-22 (×15): qty 1

## 2016-04-22 MED ORDER — TRAZODONE HCL 50 MG PO TABS
25.0000 mg | ORAL_TABLET | Freq: Every evening | ORAL | Status: DC | PRN
  Administered 2016-04-28: 50 mg via ORAL
  Filled 2016-04-22: qty 1

## 2016-04-22 MED ORDER — PROCHLORPERAZINE 25 MG RE SUPP: 12.5000 mg | Freq: Four times a day (QID) | RECTAL | Status: DC | PRN

## 2016-04-22 MED ORDER — ESCITALOPRAM OXALATE 10 MG PO TABS
10.0000 mg | ORAL_TABLET | Freq: Every day | ORAL | Status: DC
  Administered 2016-04-22 – 2016-04-28 (×7): 10 mg via ORAL
  Filled 2016-04-22 (×7): qty 1

## 2016-04-22 MED ORDER — DIPHENHYDRAMINE HCL 12.5 MG/5ML PO ELIX: 12.5000 mg | ORAL_SOLUTION | Freq: Four times a day (QID) | ORAL | Status: DC | PRN

## 2016-04-22 MED ORDER — INFLUENZA VAC SPLIT QUAD 0.5 ML IM SUSY
0.5000 mL | PREFILLED_SYRINGE | INTRAMUSCULAR | Status: AC
  Administered 2016-04-23: 0.5 mL via INTRAMUSCULAR
  Filled 2016-04-22: qty 0.5

## 2016-04-22 MED ORDER — INSULIN ASPART 100 UNIT/ML ~~LOC~~ SOLN
0.0000 [IU] | Freq: Three times a day (TID) | SUBCUTANEOUS | Status: DC
  Administered 2016-04-22: 2 [IU] via SUBCUTANEOUS

## 2016-04-22 MED ORDER — PROPRANOLOL HCL 20 MG PO TABS: 20.0000 mg | ORAL_TABLET | Freq: Four times a day (QID) | ORAL | 3 refills | Status: AC

## 2016-04-22 MED ORDER — FLEET ENEMA 7-19 GM/118ML RE ENEM: 1.0000 | ENEMA | Freq: Once | RECTAL | Status: DC | PRN

## 2016-04-22 MED ORDER — BISACODYL 10 MG RE SUPP: 10.0000 mg | Freq: Every day | RECTAL | Status: DC | PRN

## 2016-04-22 MED ORDER — PROCHLORPERAZINE MALEATE 5 MG PO TABS: 5.0000 mg | ORAL_TABLET | Freq: Four times a day (QID) | ORAL | Status: DC | PRN

## 2016-04-22 MED ORDER — LEVETIRACETAM 500 MG PO TABS
500.0000 mg | ORAL_TABLET | Freq: Two times a day (BID) | ORAL | Status: DC
  Administered 2016-04-22 – 2016-04-29 (×14): 500 mg via ORAL
  Filled 2016-04-22 (×14): qty 1

## 2016-04-22 MED ORDER — PROPRANOLOL HCL 20 MG PO TABS
20.0000 mg | ORAL_TABLET | Freq: Four times a day (QID) | ORAL | Status: DC
  Administered 2016-04-22 – 2016-04-29 (×22): 20 mg via ORAL
  Filled 2016-04-22 (×31): qty 1

## 2016-04-22 MED ORDER — ACETAMINOPHEN 325 MG PO TABS
325.0000 mg | ORAL_TABLET | ORAL | Status: DC | PRN
  Administered 2016-04-28: 650 mg via ORAL
  Filled 2016-04-22: qty 2

## 2016-04-22 MED ORDER — PANTOPRAZOLE SODIUM 40 MG PO TBEC
40.0000 mg | DELAYED_RELEASE_TABLET | Freq: Every day | ORAL | Status: DC
  Administered 2016-04-23 – 2016-04-29 (×7): 40 mg via ORAL
  Filled 2016-04-22 (×7): qty 1

## 2016-04-22 MED ORDER — ENOXAPARIN SODIUM 40 MG/0.4ML ~~LOC~~ SOLN
40.0000 mg | SUBCUTANEOUS | Status: DC
  Administered 2016-04-22 – 2016-04-28 (×7): 40 mg via SUBCUTANEOUS
  Filled 2016-04-22 (×7): qty 0.4

## 2016-04-22 MED ORDER — ALUM & MAG HYDROXIDE-SIMETH 200-200-20 MG/5ML PO SUSP: 30.0000 mL | ORAL | Status: DC | PRN

## 2016-04-22 MED ORDER — SENNOSIDES-DOCUSATE SODIUM 8.6-50 MG PO TABS
1.0000 | ORAL_TABLET | Freq: Every evening | ORAL | Status: DC | PRN
  Administered 2016-04-27: 1 via ORAL
  Filled 2016-04-22: qty 1

## 2016-04-22 MED ORDER — PROCHLORPERAZINE EDISYLATE 5 MG/ML IJ SOLN: 5.0000 mg | Freq: Four times a day (QID) | INTRAMUSCULAR | Status: DC | PRN

## 2016-04-22 MED ORDER — GUAIFENESIN-DM 100-10 MG/5ML PO SYRP: 5.0000 mL | ORAL_SOLUTION | Freq: Four times a day (QID) | ORAL | Status: DC | PRN
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
## 2016-04-22 NOTE — Progress Notes (Signed)
Rehab admissions - I spoke with patient's uncle this am.  I have medical clearance from attending MD for acute inpatient rehab admission for today.  Bed available and will admit to acute inpatient rehab today.  Call me for questions.  #[phone]

## 2016-04-22 NOTE — Care Management Note (Signed)
Case Management Note  Patient Details  Name: Victor [name] MRN: [id number] Date of Birth: 1990/12/09  Subjective/Objective:                    Action/Plan: Pt discharging to CIR today. No further needs per CM.   Expected Discharge Date:                  Expected Discharge Plan:  IP Rehab Facility  In-House Referral:     Discharge planning Services  CM Consult  Post Acute Care Choice:    Choice offered to:     DME Arranged:    DME Agency:     HH Arranged:    HH Agency:     Status of Service:  Completed, signed off  If discussed at [employer] of Stay Meetings, dates discussed:    Additional Comments:  Kermit Balo, RN 04/22/2016, 12:17 PM

## 2016-04-22 NOTE — Care Management Note (Signed)
Case Management Note  Patient Details  Name: Victor [name] MRN: [id number] Date of Birth: [birth year]  Subjective/Objective:                    Action/Plan: CM met with the patient to go over plan for discharge. Pt is hoping to get into CIR but willing to go to SNF for rehab if necessary. He states his uncle assists him at home and gives him his medications. Pt states he has trouble with his memory but hopes to get his legs strengthened up enough to be able to walk again. CM following for d/c disposition.   Expected Discharge Date:                  Expected Discharge Plan:  [place]  In-House Referral:     Discharge planning Services  CM Consult  Post Acute Care Choice:    Choice offered to:     DME Arranged:    DME Agency:     HH Arranged:    [place] Agency:     Status of Service:  Completed, signed off  If discussed at [employer] of Stay Meetings, dates discussed:    Additional Comments:  Pollie Friar, RN 04/22/2016, 12:17 PM

## 2016-04-22 NOTE — Progress Notes (Signed)
Patient A/O, no noted distress. Denies pain. Educated patient on medication, tolerated meds well with thin liquid. Ambulates with RW to BR with stand by assist. Patient loves attention, he uses the call light often. LBM 04/22/16. Cont B/B. Cognitive deficit. Skin intact.
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
## 2016-04-22 NOTE — Progress Notes (Signed)
Given report to SpreckelsDebbie, RN, unit is ready to accept pt room 7

## 2016-04-22 NOTE — Progress Notes (Signed)
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 structures with contrast; multi planar multi sequence MRI cervical spine, to include the craniocervical junction and cervicothoracic junction, and thoracic spine were obtained without and with intravenous contrast. CONTRAST:  17mL MULTIHANCE GADOBENATE DIMEGLUMINE 529 MG/ML IV SOLN COMPARISON:  MRI of the brain without contrast April 18, 2016 at 1644 hours FINDINGS: MRI HEAD FINDINGS Brain: No abnormal intracranial enhancement. Moderate global parenchymal brain volume loss. Confluent supratentorial white matter FLAIR T2 hyperintensities radiating from the periventricular margin. Patchy cortical lesions. Cerebellar lesions better seen on today's dedicated MRI. No midline shift, mass effect or masses. Smooth dural enhancement. Other: RIGHT craniotomy. MRI CERVICAL SPINE FINDINGS- mildly motion degraded examination. ALIGNMENT: Broad reversed cervical lordosis.  No malalignment. VERTEBRAE/DISCS: Vertebral bodies are intact. Intervertebral disc morphology's and signal are normal. No abnormal bone marrow signal. No abnormal osseous or intradiscal  enhancement. CORD:Mildly expansile bright T2 lesion spanning the lateral columns from C2 through C3-4, 22 mm in cranial caudad dimension with faint enhancement. 6 mm lesion RIGHT lateral column at craniocervical junction. Patchy bright T2 signal within the spinal cord at C5 without expansion or enhancement. No myelomalacia or syrinx. POSTERIOR FOSSA, VERTEBRAL ARTERIES, PARASPINAL TISSUES: No MR findings of ligamentous injury. Vertebral artery flow voids present. Included posterior fossa and paraspinal soft tissues are normal. DISC LEVELS: C2-3: No disc bulge, canal stenosis nor neural foraminal narrowing. C3-4, C4-5: Annular bulging, no canal stenosis. Mild to moderate neural foraminal narrowing. C5-6: Small RIGHT subarticular disc protrusion. No canal stenosis. Minimal RIGHT neural foraminal narrowing. C6-7 and C7-T1: No disc bulge, canal stenosis nor neural foraminal narrowing. MRI THORACIC SPINE FINDINGS- moderately motion degraded examination. ALIGNMENT: Maintenance of the thoracic kyphosis. No malalignment. VERTEBRAE/DISCS: Vertebral bodies are intact. Intervertebral discs morphology and signal are normal. No abnormal bone marrow signal. No abnormal osseous or intradiscal enhancement. CORD: Sub cm T2 bright lesion at T2 better seen on MRI cervical spine due to motion. Patchy T2 bright signal versus motion artifact upper thoracic spine without expansion or myelomalacia. Conus medullaris terminates at L1-2. PREVERTEBRAL AND PARASPINAL SOFT TISSUES: Normal though limited by motion. DISC LEVELS: No disc bulge, canal stenosis or neural foraminal narrowing at any level. IMPRESSION: MRI HEAD: Limited postcontrast follow-up MRI brain without abnormal enhancement to suggest acute inflammation. Findings compatible with severe chronic demyelination. MRI CERVICAL SPINE: Mildly motion degraded examination. Acute 2.2 cm upper cervical spinal cord demyelinating plaque. Additional nonenhancing demyelinating plaques without  myelomalacia. Early degenerative change of the cervical spine without canal stenosis. Mild-to-moderate C3-4 and C4-5 neural foraminal narrowing. MRI THORACIC SPINE: Moderately motion degraded examination limits assessment . Sub cm chronic demyelinating plaque at T2. No definite acute inflammation. Lesions may be undetected due to motion. Otherwise negative of the thoracic spine. Electronically Signed: By: Awilda Metro M.D. On: 04/18/2016 23:48     Assessment/Plan: Diagnosis: MS Labs and images independently reviewed.  Records reviewed and summated above.  1. Does the need for close, 24 hr/day medical supervision in concert with the patient's rehab needs make it unreasonable for this patient to be served in a less intensive setting? Yes  2. Co-Morbidities requiring supervision/potential complications: schizoaffective disorder (monitor), suicide attempt with TBI/SDH/BLE (monitor), leukocytosis (cont to monitor for signs and symptoms of infection, further workup if indicated), hypophosphatemia (cont to monitor, replete as necessary), hypomagnesemia (cont monitor, replete as necessary), AKI (avoid nephrotoxic meds) 3. Due to bladder management, safety, skin/wound care, disease management, medication administration and patient education, does the patient require 24 hr/day rehab nursing? Yes 4. Does the patient require coordinated care of a physician, rehab nurse, PT (1-2 hrs/day, 5 days/week), OT (1-2 hrs/day, 5 days/week) and SLP (1-2 hrs/day, 5 days/week) to address physical and functional deficits in the context of the above medical diagnosis(es)? Yes Addressing deficits in the following areas: balance, endurance, locomotion, strength, transferring, bathing, dressing, toileting, cognition, speech and psychosocial support 5. Can the patient actively participate in an intensive therapy program of at least 3 hrs of therapy per day at least 5 days per week? Yes 6. The potential for patient to make  measurable gains while on inpatient rehab is good 7. Anticipated functional outcomes upon discharge from inpatient rehab are min assist and mod assist  with PT, supervision and min assist with OT, min assist and mod assist with SLP. 8. Estimated rehab length of stay to reach the above functional goals is: 16-19 days. 9. Does the patient have adequate social supports and living environment to accommodate these discharge functional goals? Potentially 10. Anticipated D/C setting: Home 11. Anticipated post D/C treatments: HH therapy and Home excercise program 12. Overall Rehab/Functional Prognosis: good and fair  RECOMMENDATIONS: This patient's condition is appropriate for continued rehabilitative care in the following setting: Will need to inquire about caregiver support at discharge and baseline level of functioning.  Per pt, wheelchair bound for last 3-4 years.Possibly CIR. Patient has agreed to participate in recommended program. Yes Note that insurance prior authorization may be required for reimbursement for recommended care.  Comment: Rehab Admissions Coordinator to follow up.  Maryla Morrow, MD, [facility] 04/20/2016    Revision History                   Routing History
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
## 2016-04-22 NOTE — Progress Notes (Signed)
OT Cancellation Note  Patient Details Name: Victor [name] MRN: [id number] DOB: 06/30/1991   Cancelled Treatment:    Reason Eval/Treat Not Completed: Other (comment) (plan for d/c to CIR today; will defer OT needs to next venue).  Gaye Alken M.S., OTR/L Pager: [phone]  04/22/2016, 12:13 PM

## 2016-04-22 NOTE — Discharge Summary (Signed)
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 T4TOTAL, T3FREE, THYROIDAB in the last 72 hours.  Invalid input(s): FREET3 Anemia work up No results for input(s): VITAMINB12, FOLATE, FERRITIN, TIBC, IRON, RETICCTPCT in the last 72 hours. Urinalysis    Component Value Date/Time   COLORURINE YELLOW 04/18/2016 1121   APPEARANCEUR CLEAR 04/18/2016 1121   LABSPEC 1.006 04/18/2016 1121   PHURINE 6.5 04/18/2016 1121   GLUCOSEU NEGATIVE 04/18/2016 1121   HGBUR NEGATIVE 04/18/2016 1121   BILIRUBINUR NEGATIVE 04/18/2016 1121   KETONESUR NEGATIVE 04/18/2016 1121   PROTEINUR NEGATIVE 04/18/2016 1121   UROBILINOGEN 1.0 01/12/2014 1041   NITRITE NEGATIVE 04/18/2016 1121   LEUKOCYTESUR NEGATIVE 04/18/2016 1121   Sepsis Labs Invalid input(s): PROCALCITONIN,  WBC,  LACTICIDVEN Microbiology Recent Results (from the past 240 hour(s))  MRSA PCR Screening     Status: None   Collection Time: 04/19/16  2:54 AM  Result Value Ref Range Status   MRSA by PCR NEGATIVE NEGATIVE Final    Comment:        The GeneXpert  MRSA Assay (FDA approved for NASAL specimens only), is one component of a comprehensive MRSA colonization surveillance program. It is not intended to diagnose MRSA infection nor to guide or monitor treatment for MRSA infections.     Time coordinating discharge: Less than 30 minutes  SIGNED:  Merlene Laughter, DO [facility] 04/22/2016, 11:28 AM Pager [phone]  If 7PM-7AM, please contact night-coverage [email or web address] Password [email or web address]

## 2016-04-22 NOTE — Interval H&P Note (Signed)
Victor [name] was admitted today to Inpatient Rehabilitation with the diagnosis of MS.  The patient's history has been reviewed, patient examined, and there is no change in status.  Patient continues to be appropriate for intensive inpatient rehabilitation.  I have reviewed the patient's chart and labs.  Questions were answered to the patient's satisfaction. The PAPE has been reviewed and assessment remains appropriate.  SWARTZ,ZACHARY T 04/22/2016, 3:41 PM

## 2016-04-22 NOTE — Progress Notes (Signed)
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 surrounding structures with contrast; multi planar multi sequence MRI cervical spine, to include the craniocervical junction and cervicothoracic junction, and thoracic spine were obtained without and with intravenous contrast. CONTRAST:  17mL MULTIHANCE GADOBENATE DIMEGLUMINE 529 MG/ML IV SOLN COMPARISON:  MRI of the brain without contrast April 18, 2016 at 1644 hours FINDINGS: MRI HEAD FINDINGS Brain: No abnormal intracranial enhancement. Moderate global parenchymal brain volume loss. Confluent supratentorial white matter FLAIR T2 hyperintensities radiating from the periventricular margin. Patchy cortical lesions. Cerebellar lesions better seen on today's dedicated MRI. No midline shift, mass effect or masses. Smooth dural enhancement. Other: RIGHT craniotomy. MRI CERVICAL SPINE FINDINGS- mildly motion degraded examination. ALIGNMENT: Broad reversed cervical lordosis.  No malalignment. VERTEBRAE/DISCS: Vertebral bodies are intact. Intervertebral disc morphology's and signal are normal. No abnormal bone marrow signal. No abnormal osseous or intradiscal  enhancement. CORD:Mildly expansile bright T2 lesion spanning the lateral columns from C2 through C3-4, 22 mm in cranial caudad dimension with faint enhancement. 6 mm lesion RIGHT lateral column at craniocervical junction. Patchy bright T2 signal within the spinal cord at C5 without expansion or enhancement. No myelomalacia or syrinx. POSTERIOR FOSSA, VERTEBRAL ARTERIES, PARASPINAL TISSUES: No MR findings of ligamentous injury. Vertebral artery flow voids present. Included posterior fossa and paraspinal soft tissues are normal. DISC LEVELS: C2-3: No disc bulge, canal stenosis nor neural foraminal narrowing. C3-4, C4-5: Annular bulging, no canal stenosis. Mild to moderate neural foraminal narrowing. C5-6: Small RIGHT subarticular disc protrusion. No canal stenosis. Minimal RIGHT neural foraminal narrowing. C6-7 and C7-T1: No disc bulge, canal stenosis nor neural foraminal narrowing. MRI THORACIC SPINE FINDINGS- moderately motion degraded examination. ALIGNMENT: Maintenance of the thoracic kyphosis. No malalignment. VERTEBRAE/DISCS: Vertebral bodies are intact. Intervertebral discs morphology and signal are normal. No abnormal bone marrow signal. No abnormal osseous or intradiscal enhancement. CORD: Sub cm T2 bright lesion at T2 better seen on MRI cervical spine due to motion. Patchy T2 bright signal versus motion artifact upper thoracic spine without expansion or myelomalacia. Conus medullaris terminates at L1-2. PREVERTEBRAL AND PARASPINAL SOFT TISSUES: Normal though limited by motion. DISC LEVELS: No disc bulge, canal stenosis or neural foraminal narrowing at any level. IMPRESSION: MRI HEAD: Limited postcontrast follow-up MRI brain without abnormal enhancement to suggest acute inflammation. Findings compatible with severe chronic demyelination. MRI CERVICAL SPINE: Mildly motion degraded examination. Acute 2.2 cm upper cervical spinal cord demyelinating plaque. Additional nonenhancing demyelinating plaques without    myelomalacia. Early degenerative change of the cervical spine without canal stenosis. Mild-to-moderate C3-4 and C4-5 neural foraminal narrowing. MRI THORACIC SPINE: Moderately motion degraded examination limits assessment . Sub cm chronic demyelinating plaque at T2. No definite acute inflammation. Lesions may be undetected due to motion. Otherwise negative of the thoracic spine. Electronically Signed: By: Courtnay  Bloomer M.D. On: 04/18/2016 23:48     Assessment/Plan: Diagnosis: MS Labs and images independently reviewed.  Records reviewed and summated above.  1. Does the need for close, 24 hr/day medical supervision in concert with the patient's rehab needs make it unreasonable for this patient to be served in a less intensive setting? Yes  2. Co-Morbidities requiring supervision/potential complications: schizoaffective disorder (monitor), suicide attempt with TBI/SDH/BLE (monitor), leukocytosis (cont to monitor for signs and symptoms of infection, further workup if indicated), hypophosphatemia (cont to monitor, replete as necessary), hypomagnesemia (cont monitor, replete as necessary), AKI (avoid nephrotoxic meds) 3. Due to bladder management, safety, skin/wound care, disease management, medication administration and patient education, does the patient require 24 hr/day rehab nursing? Yes 4. Does the patient require coordinated care of a physician, rehab nurse, PT (1-2 hrs/day, 5 days/week), OT (1-2 hrs/day, 5 days/week) and SLP (1-2 hrs/day, 5 days/week) to address physical and functional deficits in the context of the above medical diagnosis(es)? Yes Addressing deficits in the following areas: balance, endurance, locomotion, strength, transferring, bathing, dressing, toileting, cognition, speech and psychosocial support 5. Can the patient actively participate in an intensive therapy program of at least 3 hrs of therapy per day at least 5 days per week? Yes 6. The potential for patient to make  measurable gains while on inpatient rehab is good 7. Anticipated functional outcomes upon discharge from inpatient rehab are min assist and mod assist  with PT, supervision and min assist with OT, min assist and mod assist with SLP. 8. Estimated rehab length of stay to reach the above functional goals is: 16-19 days. 9. Does the patient have adequate social supports and living environment to accommodate these discharge functional goals? Potentially 10. Anticipated D/C setting: Home 11. Anticipated post D/C treatments: HH therapy and Home excercise program 12. Overall Rehab/Functional Prognosis: good and fair  RECOMMENDATIONS: This patient's condition is appropriate for continued rehabilitative care in the following setting: Will need to inquire about caregiver support at discharge and baseline level of functioning.  Per pt, wheelchair bound for last 3-4 years.Possibly CIR. Patient has agreed to participate in recommended program. Yes Note that insurance prior authorization may be required for reimbursement for recommended care.  Comment: Rehab Admissions Coordinator to follow up.  Ankit Patel, MD, FAAPMR 04/20/2016    Revision History                   Routing History               

## 2016-04-22 NOTE — Progress Notes (Signed)
Neurology Progress Note  Subjective: The patient has no complaints this morning. He is eager to go to rehab so that he can keep working on his gait. ROS negative.   Current Meds:   Current Facility-Administered Medications:  .  acetaminophen (TYLENOL) tablet 650 mg, 650 mg, Oral, Q6H PRN, 650 mg at 04/20/16 0844 **OR** acetaminophen (TYLENOL) suppository 650 mg, 650 mg, Rectal, Q6H PRN, Eduard ClosArshad N Kakrakandy, MD .  divalproex (DEPAKOTE) DR tablet 500 mg, 500 mg, Oral, BID, Eduard ClosArshad N Kakrakandy, MD, 500 mg at 04/22/16 [id number] .  escitalopram (LEXAPRO) tablet 10 mg, 10 mg, Oral, QHS, Eduard ClosArshad N Kakrakandy, MD, 10 mg at 04/21/16 2220 .  hydrALAZINE (APRESOLINE) injection 10 mg, 10 mg, Intravenous, Q4H PRN, Eduard ClosArshad N Kakrakandy, MD .  insulin aspart (novoLOG) injection 0-9 Units, 0-9 Units, Subcutaneous, TID WC, Merlene Laughtermair Latif Sheikh, DO, 2 Units at 04/21/16 1738 .  levETIRAcetam (KEPPRA) tablet 500 mg, 500 mg, Oral, BID, Eduard ClosArshad N Kakrakandy, MD, 500 mg at 04/22/16 [id number] .  ondansetron (ZOFRAN) tablet 4 mg, 4 mg, Oral, Q6H PRN **OR** ondansetron (ZOFRAN) injection 4 mg, 4 mg, Intravenous, Q6H PRN, Eduard ClosArshad N Kakrakandy, MD .  pantoprazole (PROTONIX) EC tablet 40 mg, 40 mg, Oral, Daily, Eduard ClosArshad N Kakrakandy, MD, 40 mg at 04/22/16 [id number] .  propranolol (INDERAL) tablet 20 mg, 20 mg, Oral, Q6H, Eduard ClosArshad N Kakrakandy, MD, 20 mg at 04/22/16 [id number]  Objective:  Temp:  [98.1 F (36.7 C)-99.3 F (37.4 C)] 99.3 F (37.4 C) (09/29 0735) Pulse Rate:  [56-68] 68 (09/29 0735) Resp:  [18-20] 18 (09/29 0735) BP: (133-149)/(62-77) 134/71 (09/29 0735) SpO2:  [94 %-99 %] 99 % (09/29 0735)  General: WDWN male sitting up in chair in NAD. Alert, oriented x4. Speech is clear without dysarthria. Affect is bright. Comportment is normal. He is mildly perseverative with slightly delayed speed of processing at times.  HEENT: Neck is supple without lymphadenopathy. Mucous membranes are moist and the oropharynx is clear. Sclerae are  anicteric. There is no conjunctival injection.  CV: Regular, no murmur. Carotid pulses are 2+ and symmetric with no bruits. Distal pulses 2+ and symmetric.  Lungs: CTAB  Extremities: No C/C/E. Neuro: MS: As noted above. No aphasia.  CN: Pupils are equal and reactive from 3-->2 mm bilaterally. EOMI with breakup of smooth pursuits, no nystagmus. Facial sensation is intact to light touch. There is some flattening of the left nasolabial fold with normal strength and mobility. Hearing is intact to conversational voice. Voice is normal in tone and quality. Palate elevates symmetrically. Uvula is midline. Bilateral SCM and trapezii are 5/5. Tongue is midline with normal bulk and mobility.  Motor: Normal bulk, tone, and strength throughout. No tremor or other abnormal movements are observed.  Sensation: Intact to light touch.  DTRs: 3+, symmetric. Toes are downgoing bilaterally. No pathological reflexes.  Coordination: Finger-to-nose is slow and deliberate but without dysmetria bilaterally.   Labs: Lab Results  Component Value Date   WBC 23.7 (H) 04/21/2016   HGB 13.9 04/21/2016   HCT 43.4 04/21/2016   PLT 154 04/21/2016   GLUCOSE 158 (H) 04/21/2016   TRIG 109 01/02/2014   ALT 19 04/21/2016   AST 20 04/21/2016   NA 142 04/21/2016   K 3.9 04/21/2016   CL 109 04/21/2016   CREATININE 1.00 04/21/2016   BUN 10 04/21/2016   CO2 27 04/21/2016   INR 1.59 (H) 12/31/2013   HGBA1C 5.2 04/21/2016   CBC Latest Ref Rng & Units 04/21/2016 04/20/2016 04/18/2016  WBC  4.0 - 10.5 K/uL 23.7(H) 25.6(H) 7.7  Hemoglobin 13.0 - 17.0 g/dL 40.9 81.1 91.4  Hematocrit 39.0 - 52.0 % 43.4 46.7 46.2  Platelets 150 - 400 K/uL 154 161 157    Lab Results  Component Value Date   HGBA1C 5.2 04/21/2016   Lab Results  Component Value Date   ALT 19 04/21/2016   AST 20 04/21/2016   ALKPHOS 48 04/21/2016   BILITOT 0.4 04/21/2016   Mg++ 2.1 Phos  2.5 RPR nonreactive HIV nonreactive ANA negative NMO antibodies  negative ACE pending   A/P:   1. Multiple sclerosis: This represents a new diagnosis with involvement of both brain and spinal cord. He has completed three days of high-dose IV Solumedrol. No need for prednisone taper. Thus far MS mimics are negative, awaiting serum ACE. MRI satisfies McDonald criteria so no need for LP at this time. Outpatient neuro follow up after discharge to initiate disease modifying therapy.   2. Abnormality of gait: This is acute-on-chronic. He has some chronic gait changes related to remote TBI but these worsened more acutely, likely due to MS lesions involving the spinal cord. PT/rehab.   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   This was discussed with Dr. Marland Mcalpine. I have no further recommendations at this time and will sign off. Please call if any new issues arise.    Rhona Leavens, MD [facility]
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
## 2016-04-22 NOTE — Clinical Social Work Note (Signed)
CSW consulted for New SNF as a back up to CIR. [facility]. CSW is signing off as no further needs identified.   Dede QuerySarah Leroi Haque, MSW, LCSW  Clinical Social Worker  [phone]

## 2016-04-23 ENCOUNTER — Inpatient Hospital Stay (HOSPITAL_COMMUNITY): Payer: Medicaid Other

## 2016-04-23 ENCOUNTER — Inpatient Hospital Stay (HOSPITAL_COMMUNITY): Payer: Self-pay | Admitting: Physical Therapy

## 2016-04-23 LAB — CBC WITH DIFFERENTIAL/PLATELET
BASOS PCT: 0 %
Basophils Absolute: 0 10*3/uL (ref 0.0–0.1)
Eosinophils Absolute: 0 10*3/uL (ref 0.0–0.7)
Eosinophils Relative: 0 %
HEMATOCRIT: 44.9 % (ref 39.0–52.0)
HEMOGLOBIN: 14.1 g/dL (ref 13.0–17.0)
LYMPHS ABS: 3 10*3/uL (ref 0.7–4.0)
Lymphocytes Relative: 26 %
MCH: 26.2 pg (ref 26.0–34.0)
MCHC: 31.4 g/dL (ref 30.0–36.0)
MCV: 83.3 fL (ref 78.0–100.0)
MONO ABS: 0.8 10*3/uL (ref 0.1–1.0)
MONOS PCT: 7 %
NEUTROS ABS: 7.7 10*3/uL (ref 1.7–7.7)
NEUTROS PCT: 67 %
Platelets: 129 10*3/uL — ABNORMAL LOW (ref 150–400)
RBC: 5.39 MIL/uL (ref 4.22–5.81)
RDW: 13.7 % (ref 11.5–15.5)
WBC: 11.5 10*3/uL — ABNORMAL HIGH (ref 4.0–10.5)

## 2016-04-23 LAB — COMPREHENSIVE METABOLIC PANEL
ALBUMIN: 2.9 g/dL — AB (ref 3.5–5.0)
ALK PHOS: 39 U/L (ref 38–126)
ALT: 43 U/L (ref 17–63)
ANION GAP: 10 (ref 5–15)
AST: 26 U/L (ref 15–41)
BILIRUBIN TOTAL: 0.4 mg/dL (ref 0.3–1.2)
BUN: 12 mg/dL (ref 6–20)
CALCIUM: 8.3 mg/dL — AB (ref 8.9–10.3)
CO2: 27 mmol/L (ref 22–32)
Chloride: 105 mmol/L (ref 101–111)
Creatinine, Ser: 0.91 mg/dL (ref 0.61–1.24)
Glucose, Bld: 79 mg/dL (ref 65–99)
POTASSIUM: 3.9 mmol/L (ref 3.5–5.1)
Sodium: 142 mmol/L (ref 135–145)
TOTAL PROTEIN: 5.6 g/dL — AB (ref 6.5–8.1)
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
## 2016-04-23 NOTE — Progress Notes (Signed)
Physical Therapy Session Note  Patient Details  Name: Victor [name] MRN: [id number] Date of Birth: 01/10/1991  Today's Date: 04/23/2016 PT Individual Time: 1445-1515 PT Individual Time Calculation (min): 30 min    Short Term Goals: Week 1:  PT Short Term Goal 1 (Week 1): Pt will amb with LRAD x150  PT Short Term Goal 2 (Week 1): Pt will demonstrate dynamic standing balance with min assist PT Short Term Goal 3 (Week 1): Pt will negotiate 12 steps with min assist for strengthening  Skilled Therapeutic Interventions/Progress Updates:    Pt received resting in w/c, no c/o pain, and agreeable to therapy session.  Pt amb to ADL apartment with RW and min assist with facilitation for L<>R weight shift and verbal cues for improved step length and decreased BOS.  Pt performed bed mobility on flat bed in therapy apartment with supervision and demonstrated furniture transfer from low, soft couch with min assist for forward weight shift.  Pt engaged in simple sorting task with colored bean bags with initial mod cues fade to supervision for attention to task and problem solving.  Pt ambulated back to room at end of session and left in recliner with QRB in place, call bell in reach, and needs met.   Therapy Documentation Precautions:  Precautions Precautions: Fall Restrictions Weight Bearing Restrictions: No   See Function Navigator for Current Functional Status.   Therapy/Group: Individual Therapy  Earnest Conroy Penven-Crew 04/23/2016, 4:42 PM

## 2016-04-23 NOTE — Progress Notes (Signed)
Occupational Therapy Session Note  Patient Details  Name: Victor [name] MRN: [id number] Date of Birth: 01/27/1991  Today's Date: 04/23/2016 OT Individual Time: 1300-1330 OT Individual Time Calculation (min): 30 min     Short Term Goals: Week 1:  OT Short Term Goal 1 (Week 1): Focus on LTGs  Skilled Therapeutic Interventions/Progress Updates: OT session focused on functional mobility, transfers, and standing balance. Pt received sitting in w/c with clothing items lying on bed. Client completed dressing, requiring assist for orientation of clothing and min A to don. OT engaged client in functional mobility in ADL apartment for short, household distances. He completed with min A using RW and min cues for safety. At end of session, pt returned to room and left with all needs in reach with quick release belt donned.     Therapy Documentation Precautions:  Precautions Precautions: Fall Restrictions Weight Bearing Restrictions: No General:   Vital Signs: Therapy Vitals Pulse Rate: 86 Oxygen Therapy SpO2: (!) 86 % Pain: Pain Assessment Pain Assessment: No/denies pain ADL:   Exercises:   Other Treatments:    See Function Navigator for Current Functional Status.   Therapy/Group: Individual Therapy  Meredith Kilbride, Vara GuardianKayla N 04/23/2016, 2:06 PM
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
## 2016-04-23 NOTE — Progress Notes (Signed)
[facility] PHYSICAL MEDICINE & REHABILITATION     PROGRESS NOTE    Subjective/Complaints: Had a good night. Anxious to start therapies! Denies pain. Slept well  ROS: Pt denies fever, rash/itching, headache, blurred or double vision, nausea, vomiting, abdominal pain, diarrhea, chest pain, shortness of breath, palpitations, dysuria, dizziness, neck or back pain, bleeding, anxiety, or depression   Objective: Vital Signs: Blood pressure (!) 151/96, pulse (!) 48, temperature 98.3 F (36.8 C), temperature source Oral, resp. rate 18, height 5' 9.8" (1.773 m), weight 110.9 kg (244 lb 6.4 oz), SpO2 100 %. No results found.  Recent Labs  04/21/16 0240 04/23/16 0545  WBC 23.7* 11.5*  HGB 13.9 14.1  HCT 43.4 44.9  PLT 154 129*    Recent Labs  04/21/16 0240 04/23/16 0545  NA 142 142  K 3.9 3.9  CL 109 105  GLUCOSE 158* 79  BUN 10 12  CREATININE 1.00 0.91  CALCIUM 8.6* 8.3*   CBG (last 3)   Recent Labs  04/21/16 2131 04/22/16 0608 04/22/16 1138  GLUCAP 177* 116* 134*    Wt Readings from Last 3 Encounters:  04/22/16 110.9 kg (244 lb 6.4 oz)  04/19/16 121.8 kg (268 lb 8.3 oz)  06/25/14 102.5 kg (226 lb)    Physical Exam:  Constitutional: He appears well-developed and well-nourished.  Up in chair.  Childlike behaviors noted.   HENT:  Mouth/Throat: Oropharynx is clear and moist.  Right crani incision well healed with minimal depression.   Eyes: Conjunctivae are normal. Pupils are equal, round, and reactive to light.  Neck: Normal range of motion. Neck supple.  Cardiovascular: Normal rate and regular rhythm.   Respiratory: Effort normal and breath sounds normal. No stridor. He has no wheezes. He has no rales.  GI: Soft. Bowel sounds are normal. He exhibits no distension. There is no tenderness.  Healed old scars on abdomen.   Musculoskeletal: He exhibits no edema or tenderness.  Well healed old fasciotomy scars on right calf.    Neurological: He is alert.    Oriented to self and place.  Limited insight/understanding of current deficits. Perseverated again on prior injuries to RLE. Able to follow basic commands without difficulty.   Spasticity noted RUE/RLE, trace to 1/4.  Motor: 4+/5 throughout, except right ankle dorsiflexion 3-/5   Skin: Skin is warm and dry.  Psychiatric:  Pleasant and cooperative.    Assessment/Plan: 1. Functional, cognitive, and mobility deficits secondary to new onset multiple sclerosis which require 3+ hours per day of interdisciplinary therapy in a comprehensive inpatient rehab setting. Physiatrist is providing close team supervision and 24 hour management of active medical problems listed below. Physiatrist and rehab team continue to assess barriers to discharge/monitor patient progress toward functional and medical goals.  Function:  Bathing Bathing position   Position: Shower  Bathing parts Body parts bathed by patient: Right arm, Left arm, Chest, Abdomen, Front perineal area, Right upper leg, Left upper leg Body parts bathed by helper: Back, Left lower leg, Right lower leg, Buttocks  Bathing assist Assist Level:  (moderate assist)      Upper Body Dressing/Undressing Upper body dressing   What is the patient wearing?: Hospital gown                Upper body assist        Lower Body Dressing/Undressing Lower body dressing   What is the patient wearing?: Hospital Gown, Underwear, Non-skid slipper socks Underwear - Performed by patient: Pull underwear up/down Underwear - Performed  by helper: Thread/unthread right underwear leg, Thread/unthread left underwear leg     Non-skid slipper socks- Performed by patient: Don/doff right sock, Don/doff left sock                    Lower body assist Assist for lower body dressing:  (moderate assist)      Toileting Toileting          Toileting assist     Transfers Chair/bed transfer   Chair/bed transfer method: Stand pivot Chair/bed  transfer assist level: Touching or steadying assistance (Pt > 75%) Chair/bed transfer assistive device: Armrests, Psychiatric nurseWalker     Locomotion Ambulation           Wheelchair          Cognition Comprehension Comprehension assist level: Understands basic 75 - 89% of the time/ requires cueing 10 - 24% of the time  Expression Expression assist level: Expresses basic 75 - 89% of the time/requires cueing 10 - 24% of the time. Needs helper to occlude trach/needs to repeat words.  Social Interaction Social Interaction assist level: Interacts appropriately 50 - 74% of the time - May be physically or verbally inappropriate.  Problem Solving Problem solving assist level: Solves basic 25 - 49% of the time - needs direction more than half the time to initiate, plan or complete simple activities  Memory Memory assist level: Recognizes or recalls less than 25% of the time/requires cueing greater than 75% of the time   Medical Problem List and Plan: 1.  Functional, cognitive and mobility deficits secondary to new onset multiple sclerosis  -beginning therapies this morning 2.  DVT Prophylaxis/Anticoagulation: Pharmaceutical: Lovenox 3. Pain Management: tylenol prn 4. Mood: LCSW to follow for evaluation and support.  5. Neuropsych: This patient is not capable of making decisions on his own behalf. 6. Skin/Wound Care: Routine pressure relief measures.  7. Fluids/Electrolytes/Nutrition: I personally reviewed the patient's labs today and they are normal.  -   Offer supplements between meals prn poor intake.  8. H/o  T2DM?: Hgb A1c-5.2. BS with some elevation due to steroids will continue to ac/hs for trends.  9. Seizure disorder: on keppra bid 10. Psychoaffective disorder: On Depakote 500 mg bid and inderal qid.   Off lexapro at this time and mood stable.   -mood appropriate at present  11. Reactive leucocytosis: wbc's down to 11k.   LOS (Days) 1 A FACE TO FACE EVALUATION WAS PERFORMED  Victor [name]  T 04/23/2016 9:24 AM
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
## 2016-04-24 ENCOUNTER — Inpatient Hospital Stay (HOSPITAL_COMMUNITY): Payer: Medicaid Other | Admitting: Occupational Therapy

## 2016-04-24 NOTE — Significant Event (Signed)
Hypoglycemic Event  CBG: 62  Treatment: 15 GM carbohydrate snack  Symptoms: None  Follow-up CBG: [id number] CBG Result:87  Possible Reasons for Event: Unknown  Comments/MD notified:treated per protocol    Alfredo Martinez A

## 2016-04-24 NOTE — Progress Notes (Signed)
Occupational Therapy Session Note  Patient Details  Name: Victor [name] MRN: [id number] Date of Birth: Jan 23, 1991  Today's Date: 04/24/2016 OT Individual Time: 0340-3524 OT Individual Time Calculation (min): 49 min     Short Term Goals:Week 1:  OT Short Term Goal 1 (Week 1): Focus on LTGs  Skilled Therapeutic Interventions/Progress Updates:    Pt seen for OT ADL bathing/dressing session. Pt sitting up in w/c upon arrival,voicing desire to "get moving". Pt agreeable to bathing/dressing at sink, completing UB bathing in standing. He gathered clothing items from w/c level, requiring min VCs for attention to task and w/c management safety.  Bathing/dressing completed at sink, pt stood and washed UB, maintaining one UE support on sink ledge at all time and steadying assist provided bytherapist. He required cues for sequencing of bathing task, washing some body parts multiple times and skipping other body parts. He sat in w/c to complete LB bathing and dressing, standing with steadying support to pull pants up.  Grooming completed with supervision from seated level. Pt required VCs throughout session to be reminded that he had not yet completed some tasks/ had completed some tasks multiple times. Therapist assisted pt with using room telephone to call his uncle. Pt left sitting up in w/c at end of session, QRB donned and all needs in reach and educated regarding use of call bell.   Therapy Documentation Precautions:  Precautions Precautions: Fall Restrictions Weight Bearing Restrictions: No Pain:  No/ denies pain  See Function Navigator for Current Functional Status.   Therapy/Group: Individual Therapy  Lewis, Derrell Milanes C 04/24/2016, 6:51 AM

## 2016-04-24 NOTE — Progress Notes (Signed)
[place] PHYSICAL MEDICINE & REHABILITATION     PROGRESS NOTE    Subjective/Complaints: No new complaints. Denies pain. Liked therapy. Still focuses on weak right leg.  ROS: Pt denies fever, rash/itching, headache, blurred or double vision, nausea, vomiting, abdominal pain, diarrhea, chest pain, shortness of breath, palpitations, dysuria, dizziness, neck or back pain, bleeding, anxiety, or depression   Objective: Vital Signs: Blood pressure 135/79, pulse (!) 51, temperature 97.9 F (36.6 C), temperature source Oral, resp. rate 18, height 5' 9.8" (1.773 m), weight 110.9 kg (244 lb 6.4 oz), SpO2 100 %. No results found.  Recent Labs  04/23/16 0545  WBC 11.5*  HGB 14.1  HCT 44.9  PLT 129*    Recent Labs  04/23/16 0545  NA 142  K 3.9  CL 105  GLUCOSE 79  BUN 12  CREATININE 0.91  CALCIUM 8.3*   CBG (last 3)   Recent Labs  04/21/16 2131 04/22/16 0608 04/22/16 1138  GLUCAP 177* 116* 134*    Wt Readings from Last 3 Encounters:  04/22/16 110.9 kg (244 lb 6.4 oz)  04/19/16 121.8 kg (268 lb 8.3 oz)  06/25/14 102.5 kg (226 lb)    Physical Exam:  Constitutional: He appears well-developed and well-nourished.  Up in chair.  Childlike behaviors noted.   HENT:  Mouth/Throat: Oropharynx is clear and moist.  Right crani incision well healed with minimal depression.   Eyes: Conjunctivae are normal. Pupils are equal, round, and reactive to light.  Neck: Normal range of motion. Neck supple.  Cardiovascular: Normal rate and regular rhythm.   Respiratory: Effort normal and breath sounds normal. No stridor. He has no wheezes. He has no rales.  GI: Soft. Bowel sounds are normal. He exhibits no distension. There is no tenderness.  Healed old scars on abdomen.   Musculoskeletal: He exhibits no edema or tenderness.  Well healed old fasciotomy scars on right calf.    Neurological: He is alert.   Oriented to self and place.  Limited insight/understanding of current deficits.  Perseverated again on prior injuries to RLE. Able to follow basic commands without difficulty.   Spasticity noted RUE/RLE, trace to 1/4.  Motor: 4+/5 throughout, except right ankle dorsiflexion 3-/5   Skin: Skin is warm and dry.  Psychiatric:  Pleasant and cooperative.    Assessment/Plan: 1. Functional, cognitive, and mobility deficits secondary to new onset multiple sclerosis which require 3+ hours per day of interdisciplinary therapy in a comprehensive inpatient rehab setting. Physiatrist is providing close team supervision and 24 hour management of active medical problems listed below. Physiatrist and rehab team continue to assess barriers to discharge/monitor patient progress toward functional and medical goals.  Function:  Bathing Bathing position   Position: Shower  Bathing parts Body parts bathed by patient: Right arm, Left arm, Chest, Abdomen, Front perineal area, Right upper leg, Left upper leg Body parts bathed by helper: Back, Left lower leg, Right lower leg, Buttocks  Bathing assist Assist Level:  (moderate assist)      Upper Body Dressing/Undressing Upper body dressing   What is the patient wearing?: Pull over shirt/dress     Pull over shirt/dress - Perfomed by patient: Thread/unthread left sleeve, Thread/unthread right sleeve, Put head through opening Pull over shirt/dress - Perfomed by helper: Pull shirt over trunk        Upper body assist Assist Level: Touching or steadying assistance(Pt > 75%)      Lower Body Dressing/Undressing Lower body dressing   What is the patient wearing?: Pants  Underwear - Performed by patient: Pull underwear up/down Underwear - Performed by helper: Thread/unthread right underwear leg, Thread/unthread left underwear leg Pants- Performed by patient: Pull pants up/down, Thread/unthread left pants leg, Thread/unthread right pants leg   Non-skid slipper socks- Performed by patient: Don/doff right sock, Don/doff left sock                     Lower body assist Assist for lower body dressing: Touching or steadying assistance (Pt > 75%)      Toileting Toileting          Toileting assist     Transfers Chair/bed transfer   Chair/bed transfer method: Stand pivot Chair/bed transfer assist level: Touching or steadying assistance (Pt > 75%) Chair/bed transfer assistive device: Armrests     Locomotion Ambulation     Max distance: 150 Assist level: Touching or steadying assistance (Pt > 75%)   Wheelchair   Type: Manual Max wheelchair distance: 150 Assist Level: Supervision or verbal cues  Cognition Comprehension Comprehension assist level: Understands basic 25 - 49% of the time/ requires cueing 50 - 75% of the time  Expression Expression assist level: Expresses basic 50 - 74% of the time/requires cueing 25 - 49% of the time. Needs to repeat parts of sentences., Expresses basic 75 - 89% of the time/requires cueing 10 - 24% of the time. Needs helper to occlude trach/needs to repeat words.  Social Interaction Social Interaction assist level: Interacts appropriately 50 - 74% of the time - May be physically or verbally inappropriate.  Problem Solving Problem solving assist level: Solves basic 25 - 49% of the time - needs direction more than half the time to initiate, plan or complete simple activities  Memory Memory assist level: Recognizes or recalls less than 25% of the time/requires cueing greater than 75% of the time   Medical Problem List and Plan: 1.  Functional, cognitive and mobility deficits secondary to new onset multiple sclerosis  -continue CIR therapies  -limited insight 2.  DVT Prophylaxis/Anticoagulation: Pharmaceutical: Lovenox 3. Pain Management: tylenol prn 4. Mood: LCSW to follow for evaluation and support.  5. Neuropsych: This patient is not capable of making decisions on his own behalf. 6. Skin/Wound Care: Routine pressure relief measures.  7. Fluids/Electrolytes/Nutrition: I personally  reviewed the patient's labs today and they are normal.  -   Eating well  8. H/o  T2DM?: Hgb A1c-5.2. BS with some elevation due to steroids will continue to ac/hs for trends.  9. Seizure disorder: on keppra bid 10. Psychoaffective disorder: On Depakote 500 mg bid and inderal qid.   Off lexapro at this time and mood stable.   -mood appropriate at present  11. Reactive leucocytosis: wbc's down to 11k.   LOS (Days) 2 A FACE TO FACE EVALUATION WAS PERFORMED  Victor [name] T 04/24/2016 8:39 AM

## 2016-04-25 ENCOUNTER — Inpatient Hospital Stay (HOSPITAL_COMMUNITY): Payer: Medicaid Other | Admitting: Physical Therapy

## 2016-04-25 ENCOUNTER — Inpatient Hospital Stay (HOSPITAL_COMMUNITY): Payer: Medicaid Other | Admitting: Occupational Therapy

## 2016-04-25 LAB — GLUCOSE, CAPILLARY
GLUCOSE-CAPILLARY: 134 mg/dL — AB (ref 65–99)
GLUCOSE-CAPILLARY: 76 mg/dL (ref 65–99)
GLUCOSE-CAPILLARY: 84 mg/dL (ref 65–99)
GLUCOSE-CAPILLARY: 87 mg/dL (ref 65–99)
GLUCOSE-CAPILLARY: 99 mg/dL (ref 65–99)
GLUCOSE-CAPILLARY: 85 mg/dL (ref 65–99)
GLUCOSE-CAPILLARY: 80 mg/dL (ref 65–99)
Glucose-Capillary: 145 mg/dL — ABNORMAL HIGH (ref 65–99)
Glucose-Capillary: 86 mg/dL (ref 65–99)
Glucose-Capillary: 99 mg/dL (ref 65–99)
Glucose-Capillary: 82 mg/dL (ref 65–99)
Glucose-Capillary: 62 mg/dL — ABNORMAL LOW (ref 65–99)
Glucose-Capillary: 97 mg/dL (ref 65–99)
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
## 2016-04-25 NOTE — Progress Notes (Signed)
[place] PHYSICAL MEDICINE & REHABILITATION     PROGRESS NOTE    Subjective/Complaints: Had a good night. Excited to start therapies today. Good appetite.  ROS: Pt denies fever, rash/itching, headache, blurred or double vision, nausea, vomiting, abdominal pain, diarrhea, chest pain, shortness of breath, palpitations, dysuria, dizziness, neck or back pain, bleeding, anxiety, or depression   Objective: Vital Signs: Blood pressure 123/84, pulse (!) 56, temperature 98.1 F (36.7 C), temperature source Oral, resp. rate 20, height 5' 9.8" (1.773 m), weight 110.9 kg (244 lb 6.4 oz), SpO2 100 %. No results found.  Recent Labs  04/23/16 0545  WBC 11.5*  HGB 14.1  HCT 44.9  PLT 129*    Recent Labs  04/23/16 0545  NA 142  K 3.9  CL 105  GLUCOSE 79  BUN 12  CREATININE 0.91  CALCIUM 8.3*   CBG (last 3)   Recent Labs  04/22/16 1138  GLUCAP 134*    Wt Readings from Last 3 Encounters:  04/22/16 110.9 kg (244 lb 6.4 oz)  04/19/16 121.8 kg (268 lb 8.3 oz)  06/25/14 102.5 kg (226 lb)    Physical Exam:  Constitutional: He appears well-developed and well-nourished.  Up in chair.      HENT:  Mouth/Throat: Oropharynx is clear and moist.  Right crani incision well healed with minimal depression.   Eyes: Conjunctivae are normal. Pupils are equal, round, and reactive to light.  Neck: Normal range of motion. Neck supple.  Cardiovascular: Normal rate and regular rhythm.   Respiratory: Effort normal and breath sounds normal. No stridor. He has no wheezes. He has no rales.  GI: Soft. Bowel sounds are normal. He exhibits no distension. There is no tenderness.  Healed old scars on abdomen.   Musculoskeletal: He exhibits no edema or tenderness.  old fasciotomy scars on right calf.    Neurological: He is alert.  Oriented to self and place.  Limited insight/understanding of current deficits. Perseverated again on prior injuries to RLE. Able to follow basic commands without  difficulty.  Spasticity noted RUE/RLE, trace to 1/4 throughout--stable.  Motor: 4+/5 throughout, except right ankle dorsiflexion 3-/5   Skin: Skin is warm and dry.  Psychiatric:  Pleasant and cooperative.    Assessment/Plan: 1. Functional, cognitive, and mobility deficits secondary to new onset multiple sclerosis which require 3+ hours per day of interdisciplinary therapy in a comprehensive inpatient rehab setting. Physiatrist is providing close team supervision and 24 hour management of active medical problems listed below. Physiatrist and rehab team continue to assess barriers to discharge/monitor patient progress toward functional and medical goals.  Function:  Bathing Bathing position   Position: Wheelchair/chair at sink  Bathing parts Body parts bathed by patient: Right arm, Left arm, Chest, Abdomen, Front perineal area, Right upper leg, Left upper leg, Right lower leg, Left lower leg Body parts bathed by helper: Buttocks, Back  Bathing assist Assist Level: Touching or steadying assistance(Pt > 75%)      Upper Body Dressing/Undressing Upper body dressing   What is the patient wearing?: Pull over shirt/dress     Pull over shirt/dress - Perfomed by patient: Thread/unthread left sleeve, Thread/unthread right sleeve, Put head through opening Pull over shirt/dress - Perfomed by helper: Pull shirt over trunk        Upper body assist Assist Level: Supervision or verbal cues      Lower Body Dressing/Undressing Lower body dressing   What is the patient wearing?: Shoes Underwear - Performed by patient: Thread/unthread right underwear leg,  Thread/unthread left underwear leg, Pull underwear up/down Underwear - Performed by helper: Thread/unthread right underwear leg, Thread/unthread left underwear leg Pants- Performed by patient: Thread/unthread right pants leg, Thread/unthread left pants leg, Pull pants up/down   Non-skid slipper socks- Performed by patient: Don/doff right sock,  Don/doff left sock   Socks - Performed by patient: Don/doff right sock, Don/doff left sock   Shoes - Performed by patient: Don/doff right shoe, Don/doff left shoe Shoes - Performed by helper: Fasten right, Fasten left          Lower body assist Assist for lower body dressing: Touching or steadying assistance (Pt > 75%)      Toileting Toileting          Toileting assist     Transfers Chair/bed transfer   Chair/bed transfer method: Stand pivot Chair/bed transfer assist level: Touching or steadying assistance (Pt > 75%) Chair/bed transfer assistive device: Armrests     Locomotion Ambulation     Max distance: 150 Assist level: Touching or steadying assistance (Pt > 75%)   Wheelchair   Type: Manual Max wheelchair distance: 150 Assist Level: Supervision or verbal cues  Cognition Comprehension Comprehension assist level: Understands basic 25 - 49% of the time/ requires cueing 50 - 75% of the time  Expression Expression assist level: Expresses basic 50 - 74% of the time/requires cueing 25 - 49% of the time. Needs to repeat parts of sentences., Expresses basic 75 - 89% of the time/requires cueing 10 - 24% of the time. Needs helper to occlude trach/needs to repeat words.  Social Interaction Social Interaction assist level: Interacts appropriately 50 - 74% of the time - May be physically or verbally inappropriate.  Problem Solving Problem solving assist level: Solves basic 25 - 49% of the time - needs direction more than half the time to initiate, plan or complete simple activities  Memory Memory assist level: Recognizes or recalls less than 25% of the time/requires cueing greater than 75% of the time   Medical Problem List and Plan: 1.  Functional, cognitive and mobility deficits secondary to new onset multiple sclerosis  -continue CIR therapies  -cooperative 2.  DVT Prophylaxis/Anticoagulation: Pharmaceutical: Lovenox 3. Pain Management: tylenol prn 4. Mood: LCSW to follow  for evaluation and support.  5. Neuropsych: This patient is not capable of making decisions on his own behalf. 6. Skin/Wound Care: Routine pressure relief measures.  7. Fluids/Electrolytes/Nutrition: I personally reviewed the patient's labs today and they are normal.  -   Eating well  8. H/o  T2DM?: Hgb A1c-5.2. BS with some elevation due to steroids  -sugars low to normal  -dc cbg's and ssi 9. Seizure disorder: on keppra bid 10. Psychoaffective disorder: On Depakote 500 mg bid and inderal qid.   Off lexapro at this time and mood stable.   -mood appropriate at present  11. Reactive leucocytosis: wbc's down to 11k.   LOS (Days) 3 A FACE TO FACE EVALUATION WAS PERFORMED  SWARTZ,ZACHARY T 04/25/2016 8:47 AM

## 2016-04-25 NOTE — Progress Notes (Signed)
Physical Therapy Session Note  Patient Details  Name: Victor [name] MRN: [id number] Date of Birth: [birth year]  Today's Date: 04/25/2016 PT Individual Time: 0800-0930 PT Individual Time Calculation (min): 90 min    Short Term Goals: Week 1:  PT Short Term Goal 1 (Week 1): Pt will amb with LRAD x150  PT Short Term Goal 2 (Week 1): Pt will demonstrate dynamic standing balance with min assist PT Short Term Goal 3 (Week 1): Pt will negotiate 12 steps with min assist for strengthening  Skilled Therapeutic Interventions/Progress Updates:   Pt received seated in w/c, denies pain and agreeable to treatment. Pt propels w/c around room to retrieve clothing and shoes; ultimately decides he does not want to put on clean clothes until he showers with OT later this morning but does don shoes with minA to fasten. W/c propulsion to gym with BUE/BLE for strengthening and endurance. Stand pivot transfer with RW and min guard w/c >mat table; small shuffling steps and forward flexed posture. Gait with RW x90'; continued short shuffling step length with wide BOS; requires cues for narrow BOS to prevent RLE from hitting RW leg. Standing alternating toe taps to 3" step with BUE RW support; progressed to no RW using therapist for HHA; pt fearful of trying without RW and only HHA; changed to therapist in front of pt with BUEs on therapist's shoulders; continues to demonstrate difficulty with weight shift, ankle strategy for balance recover. Static standing balance with no UE support with self-selected wide BOS, progressed to feet shoulder distance apart, x3 trials 30 sec each with standbyA and increased sway with BOS narrowed. Attempted balance progression in parallel bars however pt with significant difficulty following commands and understanding purpose of activity; when cued for narrow BOS, pt unwilling to pick both hands up off bars, and once pt verbalizes understanding in goal of challenging balance, he moves feet to  self-selected wide BOS to complete standing without UE support. After a variety of cueing strategies, pt able to perform narrow BOS and staggered stance; difficulty when progressed to tandem stance. Gait x20' with min guard and RW. Nustep x8 min with BUE/BLE level 5 with average 40 steps/min. Returned to room in w/c with S. Remained seated in w/c with quick release belt intact and all needs in reach.   Therapy Documentation Precautions:  Precautions Precautions: Fall Restrictions Weight Bearing Restrictions: No Pain: Pain Assessment Pain Assessment: No/denies pain Pain Score: 0-No pain   See Function Navigator for Current Functional Status.   Therapy/Group: Individual Therapy  Vista Lawman 04/25/2016, 9:15 AM

## 2016-04-25 NOTE — Progress Notes (Signed)
Patient information reviewed and entered into eRehab system by Jelisha Weed, RN, CRRN, PPS Coordinator.  Information including medical coding and functional independence measure will be reviewed and updated through discharge.    

## 2016-04-25 NOTE — IPOC Note (Signed)
Overall Plan of Care [facility]) Patient Details Name: Victor [name] MRN: [id number] DOB: 1990/09/22  Admitting Diagnosis: MS new onset  Hospital Problems: Principal Problem:   Multiple sclerosis (HCC) Active Problems:   Acute blood loss anemia   History of traumatic brain injury   AKI (acute kidney injury) (HCC)     Functional Problem List: Nursing Endurance, Medication Management, Motor  PT Balance, Behavior, Motor, Perception, Safety  OT Balance, Behavior, Cognition, Vision, Endurance, Freight forwarder, Sensory  SLP    TR         Basic ADL's: OT Grooming, Bathing, Dressing, Toileting     Advanced  ADL's: OT       Transfers: PT Bed Mobility, Bed to Chair, Car, Occupational psychologist, Research scientist (life sciences): PT Stairs, Psychologist, prison and probation services, Ambulation     Additional Impairments: OT Other (comment) (UE spasticity and cognitive deficits)  SLP        TR      Anticipated Outcomes Item Anticipated Outcome  Self Feeding n/a  Swallowing      Basic self-care  supervision overall with min assist for bathing  Toileting  supervision   Bathroom Transfers supervision  Bowel/Bladder  n/a  Transfers  supervision  Locomotion  supervision with RW   Communication     Cognition     Pain  n/a  Safety/Judgment  maintain safety with cues/supervision   Therapy Plan: PT Intensity: Minimum of 1-2 x/day ,45 to 90 minutes PT Frequency: 5 out of 7 days PT Duration Estimated Length of Stay: 10-14 days OT Intensity: Minimum of 1-2 x/day, 45 to 90 minutes OT Frequency: 5 out of 7 days OT Duration/Estimated Length of Stay: 10-14 days         Team Interventions: Nursing Interventions Patient/Family Education, Disease Management/Prevention, Discharge Planning, Psychosocial Support, Medication Management  PT interventions Ambulation/gait training, Discharge planning, Functional mobility training, Psychosocial support, Therapeutic Activities, Wheelchair propulsion/positioning,  Therapeutic Exercise, Neuromuscular re-education, Warden/ranger, Cognitive remediation/compensation, Splinting/orthotics, UE/LE Strength taining/ROM, UE/LE Coordination activities, Stair training, Patient/family education, Firefighter, Fish farm manager  OT Interventions Warden/ranger, Cognitive remediation/compensation, Firefighter, Discharge planning, Functional mobility training, Functional electrical stimulation, Neuromuscular re-education, Patient/family education, Psychosocial support, Therapeutic Activities, Splinting/orthotics, Self Care/advanced ADL retraining, Therapeutic Exercise, UE/LE Strength taining/ROM, UE/LE Coordination activities, Visual/perceptual remediation/compensation  SLP Interventions    TR Interventions    SW/CM Interventions Discharge Planning, Psychosocial Support, Patient/Family Education    Team Discharge Planning: Destination: PT-Home ,OT- Home , SLP-  Projected Follow-up: PT-Home health PT, 24 hour supervision/assistance, OT-  Other (comment) (TBD), SLP-  Projected Equipment Needs: PT-To be determined, OT- None recommended by OT, SLP-  Equipment Details: PT- , OT-pt has equipment from prior CIR stay Patient/family involved in discharge planning: PT- Patient,  OT-Patient, SLP-   MD ELOS: 10-14 days Medical Rehab Prognosis:  Excellent Assessment: The patient has been admitted for CIR therapies with the diagnosis of new onset MS. The team will be addressing functional mobility, strength, stamina, balance, safety, adaptive techniques and equipment, self-care, bowel and bladder mgt, patient and caregiver education, NMR, visual-spatial awareness, cognition and behavior rx, w/c mobility, stairs . Goals have been set at supervision for mobility, self-care and cognitive tasks.    Ranelle Oyster, MD, FAAPMR      See Team Conference Notes for weekly updates to the plan of care

## 2016-04-25 NOTE — Progress Notes (Signed)
Occupational Therapy Session Note  Patient Details  Name: Victor [name] MRN: [id number] Date of Birth: [birth year]  Today's Date: 04/25/2016 OT Individual Time: 1045-1200 and 1330-1400 OT Individual Time Calculation (min): 75 min and 30 min   Short Term Goals:Week 1:  OT Short Term Goal 1 (Week 1): Focus on LTGs  Skilled Therapeutic Interventions/Progress Updates:    Session One: Pt seen for OT ADL bathing/dressing session. Pt sitting up in w/[name] upon arrival, agreeable to tx session. He ambulated with min-mod A using RW into bathroom and completed shower transfer with verbal and tactile cues for sequencing and technique to ensure he was at bench before sitting. He bathed seated on tub transfer bench with supervision and min VCs to cont with task. He stood with steadying assist and use of grab bards to complete buttock hygiene. From w/[name] level, pt gathered clothing items, requiring VCs for problem solving, sequencing and recalling which clothing items were in which drawer.  Pt stood with steadying assist and support of wall for balance when pulling up pants. Pt very fearful of standing without UE support.  Increased time and support provided with teaching pt how to tie shoes, however, pt with difficulty with new learning and following demonstration and verbal cues. Pt will benefit  He completed oral care standing at the sink with steadying assist, leaning heavily into sink counter for support. With increased time, pt able to manage set-up of grooming supplies with fine motor skills.  Pt desiring to practice walking. He completed x2 trials of ~5812ft then ~15 ft with min- mod A with VCs for step length and speed control. Pt returned to w/[name] at end of session, left seated in w/[name] with QRB donned, all needs in reach and awaiting lunch. Pt re-educated regarding use of call bell.   Session Two: Pt seen for OT session focusing on ADL re-training and functional standing balance. Pt sitting on toilet upon arrival  with NT present assisting and handed off to OT. Pt stood with steadying assist and support of wall to pull pants up. He required max VCs and tactile cues for RW management when ambulating and turning to return to w/[name]. Grooming completed from w/[name] level at sink with VCs to locate needed items.  Pt unlaced B shoes and therapist replaced shoe laces with elastic shoelaces, educated pt regarding use and pt re-donned shoes with set-up. [facility]iced [facility] and reaching outside BOS without UE support. Pt stood with steadying assist and reached across midline and outside BOS to give therapist "high five", 1 LOB episode requiring mod A to regain balance. Pt then voiced need for another bowel movement. He ambulated into bathroom, once again requiring VCs for safety awareness/ technique and RW management. Pt left seated on toilet with hand off to RN for supervision.   Therapy Documentation Precautions:  Precautions Precautions: Fall Restrictions Weight Bearing Restrictions: No Pain: Pain Assessment Pain Assessment: No/denies pain Pain Score: 0-No pain  See Function Navigator for Current Functional Status.   Therapy/Group: Individual Therapy  Victor [name] 04/25/2016, 7:12 AM

## 2016-04-26 ENCOUNTER — Inpatient Hospital Stay (HOSPITAL_COMMUNITY): Payer: Self-pay | Admitting: Occupational Therapy

## 2016-04-26 ENCOUNTER — Inpatient Hospital Stay (HOSPITAL_COMMUNITY): Payer: Self-pay | Admitting: Physical Therapy

## 2016-04-26 NOTE — Progress Notes (Signed)
Physical Therapy Session Note  Patient Details  Name: Victor [name] MRN: [id number] Date of Birth: Sep 25, 1990  Today's Date: 04/26/2016 PT Individual Time: 0800-0900 and 1330-1400 PT Individual Time Calculation (min): 60 min and 30 min (total 90 min)    Short Term Goals: Week 1:  PT Short Term Goal 1 (Week 1): Pt will amb with LRAD x150  PT Short Term Goal 2 (Week 1): Pt will demonstrate dynamic standing balance with min assist PT Short Term Goal 3 (Week 1): Pt will negotiate 12 steps with min assist for strengthening  Skilled Therapeutic Interventions/Progress Updates:   Tx 1: Pt received seated in w/c, denies pain and agreeable to treatment. Performed upper/lower body dressing with S while seated in w/c at sink, sit <>stand with S. Gait to gym with RW and close S x170'. Standing alternating toe taps to 3" step, use of RW initially to improve confidence and coordination, progressed to HHA with verbal/tactile cues for weight shifting, and repetitive same-side stepping for facilitation for weight shifting into SLS. Gait without AD, +2 HHA progressed to one HHA and eventually min guard. While ambulating pt cued in reading signs due to perseveration on not being able to read well; required max cueing for sounding out words and does better with large print. Pt demonstrates ineffective ankle strategy and core strength deficits which impair righting reactions. Standing balance with BUE boxing hits to therapist's hands for internal/external perturbation and righting strategies; requires min/modA to regain balance after posterior LOB. Gait to return to room x150' with no AD and min guard>modA with posterior/R lateral LOBs; seated in w/c as pt demonstrates increased gait deviations indicative of fatigue. Remained seated in w/c with quick release belt intact and all needs in reach.   Tx 2: Pt received seated in w/c at nurses station, denies pain and agreeable to treatment. W/c propulsion to/from gym with BLEs for  strengthening and endurance. Stand pivot transfer no AD to/from w/c. Standing balance with turning R/L, side stepping with min guard HHA with gradual reduction in support provided to min guard. Semi-reclined sit-ups on wedge 2x20, and trunk rotation with 3# ball for focus on core strength for carryover into righting reactions and standing balance. Returned to room in w/c as above; remained seated in w/c with quick release belt intact and all needs in reach.   Therapy Documentation Precautions:  Precautions Precautions: Fall Restrictions Weight Bearing Restrictions: No   See Function Navigator for Current Functional Status.   Therapy/Group: Individual Therapy  Vista Lawman 04/26/2016, 10:19 AM
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
## 2016-04-26 NOTE — Progress Notes (Signed)
Occupational Therapy Session Note  Patient Details  Name: Victor [name] MRN: [id number] Date of Birth: 11-17-1990  Today's Date: 04/26/2016 OT Individual Time: [address] and 1430-1500 OT Individual Time Calculation (min): 75 min and 30 min    Short Term Goals:Week 1:  OT Short Term Goal 1 (Week 1): Focus on LTGs  Skilled Therapeutic Interventions/Progress Updates:    Session One: Pt seen for OT ADL bathing/dressing session. Pt sitting up in w/c upon arrival, voicing desire for showering task. He ambulated throughout session using RW with steadying assist and verbal/ tactile cues for RW management.  He bathed seated on tub bench, standing with steadying assist to complete buttock hygiene. He dressed seated in w/c, standing with RW and steadying assist to pull pants up. VCs for safety awareness. Pt ambulated with RW to therapy gym. He completed dynamic standing task, reaching to remove clothes pins from waistband without UE support on RW. Completed x2 trials.  Pt ambulated back to room, completed hand hygiene standing at sink, returned to w/c and left sitting in w/c with QRB donned and all needs in reach.  Pt's uncle present during session, spoke with him regarding pt's PLOF prior to admission and baseline abilities.   Session Two: Pt seen for OT session focusing on functional activity tolerance and balance. Pt sitting up in w/c upon arrival, agreeable to tx session. He ambulated to therapy gym with close supervision and VCs for safety awareness. Needed cues for locating gym. In Therapy gym, pt completed Wii bowling activity in standing. Required overall mod A dynamic standing balance without UE support, periods of close supervision, and with VCS for weight shift. Completed x2 trials of bowling games with seated rest breaks btwn trials.  Pt ambulated back to room at end of session, again requiring cues for navigation, left seated in w/c with QRB donned and all needs in reach.   Therapy  Documentation Precautions:  Precautions Precautions: Fall Restrictions Weight Bearing Restrictions: No Pain: Pain Assessment Pain Assessment: No/denies pain Pain Score: 0-No pain  See Function Navigator for Current Functional Status.   Therapy/Group: Individual Therapy  Lewis, Ayvin Lipinski C 04/26/2016, 7:11 AM

## 2016-04-26 NOTE — Progress Notes (Signed)
[facility]     PROGRESS NOTE    Subjective/Complaints: "i'm ready to get this leg moving today. Are you going to get me up?"  ROS: Pt denies fever, rash/itching, headache, blurred or double vision, nausea, vomiting, abdominal pain, diarrhea, chest pain, shortness of breath, palpitations, dysuria, dizziness, neck or back pain, bleeding, anxiety, or depression   Objective: Vital Signs: Blood pressure (!) 147/79, pulse (!) 52, temperature 98.6 F (37 C), temperature source Oral, resp. rate 20, height 5' 9.8" (1.773 m), weight 110.9 kg (244 lb 6.4 oz), SpO2 100 %. No results found. No results for input(s): WBC, HGB, HCT, PLT in the last 72 hours. No results for input(s): NA, K, CL, GLUCOSE, BUN, CREATININE, CALCIUM in the last 72 hours.  Invalid input(s): CO CBG (last 3)   Recent Labs  04/24/16 1629 04/24/16 2039 04/25/16 0634  GLUCAP 99 85 80    Wt Readings from Last 3 Encounters:  04/22/16 110.9 kg (244 lb 6.4 oz)  04/19/16 121.8 kg (268 lb 8.3 oz)  06/25/14 102.5 kg (226 lb)    Physical Exam:  Constitutional: He appears well-developed and well-nourished.  Up in chair.      HENT:  Mouth/Throat: Oropharynx is clear and moist.  Right crani incision well healed with minimal depression.   Eyes: Conjunctivae are normal. Pupils are equal, round, and reactive to light.  Neck: Normal range of motion. Neck supple.  Cardiovascular: Normal rate and regular rhythm.   Respiratory: Effort normal and breath sounds normal. No stridor. He has no wheezes. He has no rales.  GI: Soft. Bowel sounds are normal. He exhibits no distension. There is no tenderness.  Healed old scars on abdomen.   Musculoskeletal: He exhibits no edema or tenderness.  old fasciotomy scars on right calf.    Neurological: He is alert.  Oriented to self and place.  Limited insight/understanding of current deficits. Doesn't recall who I am, even 5 minutes after being reminded.  Able to follow basic commands without difficulty.  Spasticity noted RUE/RLE, trace to 1/4 throughout--stable.  Motor: 4+/5 throughout, except right ankle dorsiflexion 3-/5   Skin: Skin is warm and dry.  Psychiatric:  Pleasant and cooperative.    Assessment/Plan: 1. Functional, cognitive, and mobility deficits secondary to new onset multiple sclerosis which require 3+ hours per day of interdisciplinary therapy in a comprehensive inpatient rehab setting. Physiatrist is providing close team supervision and 24 hour management of active medical problems listed below. Physiatrist and rehab team continue to assess barriers to discharge/monitor patient progress toward functional and medical goals.  Function:  Bathing Bathing position   Position: Shower  Bathing parts Body parts bathed by patient: Right arm, Left arm, Chest, Abdomen, Front perineal area, Right upper leg, Left upper leg, Right lower leg, Left lower leg, Buttocks Body parts bathed by helper: Back  Bathing assist Assist Level: Touching or steadying assistance(Pt > 75%)      Upper Body Dressing/Undressing Upper body dressing   What is the patient wearing?: Pull over shirt/dress     Pull over shirt/dress - Perfomed by patient: Thread/unthread left sleeve, Thread/unthread right sleeve, Put head through opening, Pull shirt over trunk Pull over shirt/dress - Perfomed by helper: Pull shirt over trunk        Upper body assist Assist Level: Supervision or verbal cues      Lower Body Dressing/Undressing Lower body dressing   What is the patient wearing?: Pants, Socks, Shoes Underwear - Performed by patient: Thread/unthread  right underwear leg, Thread/unthread left underwear leg, Pull underwear up/down Underwear - Performed by helper: Thread/unthread right underwear leg, Thread/unthread left underwear leg Pants- Performed by patient: Thread/unthread right pants leg, Thread/unthread left pants leg, Pull pants up/down   Non-skid  slipper socks- Performed by patient: Don/doff right sock, Don/doff left sock   Socks - Performed by patient: Don/doff right sock, Don/doff left sock   Shoes - Performed by patient: Don/doff right shoe, Don/doff left shoe Shoes - Performed by helper: Fasten right, Fasten left          Lower body assist Assist for lower body dressing: Touching or steadying assistance (Pt > 75%)      Toileting Toileting   Toileting steps completed by patient: Adjust clothing prior to toileting, Adjust clothing after toileting Toileting steps completed by helper: Performs perineal hygiene Toileting Assistive Devices: Grab bar or rail  Toileting assist Assist level: Touching or steadying assistance (Pt.75%)   Transfers Chair/bed transfer   Chair/bed transfer method: Stand pivot Chair/bed transfer assist level: Touching or steadying assistance (Pt > 75%) Chair/bed transfer assistive device: Armrests, Bedrails     Locomotion Ambulation     Max distance: 90 Assist level: Touching or steadying assistance (Pt > 75%)   Wheelchair   Type: Manual Max wheelchair distance: 150 Assist Level: Supervision or verbal cues  Cognition Comprehension Comprehension assist level: Understands basic 25 - 49% of the time/ requires cueing 50 - 75% of the time  Expression Expression assist level: Expresses basic 50 - 74% of the time/requires cueing 25 - 49% of the time. Needs to repeat parts of sentences., Expresses basic 75 - 89% of the time/requires cueing 10 - 24% of the time. Needs helper to occlude trach/needs to repeat words.  Social Interaction Social Interaction assist level: Interacts appropriately 50 - 74% of the time - May be physically or verbally inappropriate.  Problem Solving Problem solving assist level: Solves basic 25 - 49% of the time - needs direction more than half the time to initiate, plan or complete simple activities  Memory Memory assist level: Recognizes or recalls less than 25% of the  time/requires cueing greater than 75% of the time   Medical Problem List and Plan: 1.  Functional, cognitive and mobility deficits secondary to new onset multiple sclerosis  -continue CIR therapies  -team conference today 2.  DVT Prophylaxis/Anticoagulation: Pharmaceutical: Lovenox 3. Pain Management: tylenol prn 4. Mood: LCSW to follow for evaluation and support.  5. Neuropsych: This patient is not capable of making decisions on his own behalf. 6. Skin/Wound Care: Routine pressure relief measures.  7. Fluids/Electrolytes/Nutrition: I personally reviewed the patient's labs today and they are normal.  -   Eating well  8. H/o  T2DM?: Hgb A1c-5.2. BS with some elevation due to steroids  -sugars low to normal  -dc cbg's and ssi 9. Seizure disorder: on keppra bid 10. Psychoaffective disorder: On Depakote 500 mg bid and inderal qid.   Off lexapro at this time and mood stable.   -mood appropriate at present  11. Reactive leucocytosis: wbc's down to 11k.   LOS (Days) 4 A FACE TO FACE EVALUATION WAS PERFORMED  Jinelle Butchko T 04/26/2016 7:24 AM

## 2016-04-27 ENCOUNTER — Inpatient Hospital Stay (HOSPITAL_COMMUNITY): Payer: Self-pay | Admitting: Occupational Therapy

## 2016-04-27 ENCOUNTER — Inpatient Hospital Stay (HOSPITAL_COMMUNITY): Payer: Self-pay | Admitting: Physical Therapy

## 2016-04-27 NOTE — Progress Notes (Signed)
Physical Therapy Session Note  Patient Details  Name: Sayer Derbyshire MRN: [id number] Date of Birth: 05/29/1991  Today's Date: 04/27/2016 PT Individual Time: 0800-0900 PT Individual Time Calculation (min): 60 min    Short Term Goals: Week 1:  PT Short Term Goal 1 (Week 1): Pt will amb with LRAD x150  PT Short Term Goal 2 (Week 1): Pt will demonstrate dynamic standing balance with min assist PT Short Term Goal 3 (Week 1): Pt will negotiate 12 steps with min assist for strengthening  Skilled Therapeutic Interventions/Progress Updates:   Pt received seated in bed finishing breakfast; denies pain. Lower body dressing including shorts and shoes performed EOB with setupA and S for sit <>stand. Gait within room with RW to stand at sink and perform hygiene with S. Gait to gym x150' with Rw and S, min cues for narrow BOS to prevent feet from hitting RW legs. Gait over ramp and uneven surface with S, min cues for RW management over change in surface to maintain RW closer to body. Standing balance with eyes open/closed; pt demonstrates inefficient ankle strategy with posterior LOB. Static standing on small red wedge with RW to achieve neutral balance and UEs lifted for anterior tib/closed chair dorsiflexion activation. Initially unable to hold >1-2 seconds, improved to 5-6 seconds at a time before posterior LOB. Performed again with card board in front of pt with cues to match cards for anterior weight shift and distract from balance activity and pt then able to maintain 45 sec-1 min at a time before LOB. Stairs x8 on 6" height steps with B handrails and S. Returned to room with gait no AD and min guard x175'. Remained seated in w/c with quick release belt intact and end of session, all needs in reach.   Therapy Documentation Precautions:  Precautions Precautions: Fall Restrictions Weight Bearing Restrictions: No   See Function Navigator for Current Functional Status.   Therapy/Group: Individual  Therapy  Vista Lawman 04/27/2016, 10:45 AM

## 2016-04-27 NOTE — Progress Notes (Signed)
[facility]     PROGRESS NOTE    Subjective/Complaints: "i'm ready to get this leg moving today. Are you going to get me up?"  ROS: Pt denies fever, rash/itching, headache, blurred or double vision, nausea, vomiting, abdominal pain, diarrhea, chest pain, shortness of breath, palpitations, dysuria, dizziness, neck or back pain, bleeding, anxiety, or depression   Objective: Vital Signs: Blood pressure 129/81, pulse 82, temperature 98.2 F (36.8 C), temperature source Oral, resp. rate 20, height 5' 9.8" (1.773 m), weight 112.4 kg (247 lb 12.8 oz), SpO2 98 %. No results found. No results for input(s): WBC, HGB, HCT, PLT in the last 72 hours. No results for input(s): NA, K, CL, GLUCOSE, BUN, CREATININE, CALCIUM in the last 72 hours.  Invalid input(s): CO CBG (last 3)   Recent Labs  04/24/16 1629 04/24/16 2039 04/25/16 0634  GLUCAP 99 85 80    Wt Readings from Last 3 Encounters:  04/27/16 112.4 kg (247 lb 12.8 oz)  04/19/16 121.8 kg (268 lb 8.3 oz)  06/25/14 102.5 kg (226 lb)    Physical Exam:  Constitutional: He appears well-developed and well-nourished.  Up in chair.      HENT:  Mouth/Throat: Oropharynx is clear and moist.  Right crani incision well healed with minimal depression.   Eyes: Conjunctivae are normal. Pupils are equal, round, and reactive to light.  Neck: Normal range of motion. Neck supple.  Cardiovascular: Normal rate and regular rhythm.   Respiratory: Effort normal and breath sounds normal. No stridor. He has no wheezes. He has no rales.  GI: Soft. Bowel sounds are normal. He exhibits no distension. There is no tenderness.  Healed old scars on abdomen.   Musculoskeletal: He exhibits no edema or tenderness.  old fasciotomy scars on right calf.    Neurological: He is alert.  Oriented to self and place.  Limited insight/understanding of current deficits. Doesn't recall who I am, even 5 minutes after being reminded. Able to  follow basic commands without difficulty.  Spasticity noted RUE/RLE, trace to 1/4 throughout--stable.  Motor: 4+/5 throughout, except right ankle dorsiflexion 3-/5   Skin: Skin is warm and dry.  Psychiatric:  Pleasant and cooperative.    Assessment/Plan: 1. Functional, cognitive, and mobility deficits secondary to new onset multiple sclerosis which require 3+ hours per day of interdisciplinary therapy in a comprehensive inpatient rehab setting. Physiatrist is providing close team supervision and 24 hour management of active medical problems listed below. Physiatrist and rehab team continue to assess barriers to discharge/monitor patient progress toward functional and medical goals.  Function:  Bathing Bathing position   Position: Shower  Bathing parts Body parts bathed by patient: Right arm, Left arm, Chest, Abdomen, Front perineal area, Right upper leg, Left upper leg, Right lower leg, Left lower leg, Buttocks Body parts bathed by helper: Back  Bathing assist Assist Level: Touching or steadying assistance(Pt > 75%)      Upper Body Dressing/Undressing Upper body dressing   What is the patient wearing?: Pull over shirt/dress     Pull over shirt/dress - Perfomed by patient: Thread/unthread left sleeve, Thread/unthread right sleeve, Put head through opening, Pull shirt over trunk Pull over shirt/dress - Perfomed by helper: Pull shirt over trunk        Upper body assist Assist Level: Supervision or verbal cues      Lower Body Dressing/Undressing Lower body dressing   What is the patient wearing?: Pants, Socks, Shoes Underwear - Performed by patient: Thread/unthread right underwear  leg, Thread/unthread left underwear leg, Pull underwear up/down Underwear - Performed by helper: Thread/unthread right underwear leg, Thread/unthread left underwear leg Pants- Performed by patient: Thread/unthread right pants leg, Thread/unthread left pants leg, Pull pants up/down   Non-skid slipper  socks- Performed by patient: Don/doff right sock, Don/doff left sock   Socks - Performed by patient: Don/doff right sock, Don/doff left sock   Shoes - Performed by patient: Don/doff right shoe, Don/doff left shoe Shoes - Performed by helper: Fasten right, Fasten left          Lower body assist Assist for lower body dressing: Touching or steadying assistance (Pt > 75%)      Toileting Toileting   Toileting steps completed by patient: Adjust clothing prior to toileting, Adjust clothing after toileting Toileting steps completed by helper: Performs perineal hygiene Toileting Assistive Devices: Grab bar or rail  Toileting assist Assist level: Touching or steadying assistance (Pt.75%)   Transfers Chair/bed transfer   Chair/bed transfer method: Stand pivot Chair/bed transfer assist level: Touching or steadying assistance (Pt > 75%) Chair/bed transfer assistive device: Armrests, Bedrails     Locomotion Ambulation     Max distance: 175 Assist level: Touching or steadying assistance (Pt > 75%)   Wheelchair   Type: Manual Max wheelchair distance: 150 Assist Level: Supervision or verbal cues  Cognition Comprehension Comprehension assist level: Understands basic 25 - 49% of the time/ requires cueing 50 - 75% of the time  Expression Expression assist level: Expresses basic 50 - 74% of the time/requires cueing 25 - 49% of the time. Needs to repeat parts of sentences., Expresses basic 75 - 89% of the time/requires cueing 10 - 24% of the time. Needs helper to occlude trach/needs to repeat words.  Social Interaction Social Interaction assist level: Interacts appropriately 50 - 74% of the time - May be physically or verbally inappropriate.  Problem Solving Problem solving assist level: Solves basic 25 - 49% of the time - needs direction more than half the time to initiate, plan or complete simple activities  Memory Memory assist level: Recognizes or recalls less than 25% of the time/requires  cueing greater than 75% of the time   Medical Problem List and Plan: 1.  Functional, cognitive and mobility deficits secondary to new onset multiple sclerosis  -continue CIR therapies  -progressing toward goals  -need communication with family regarding dispo plan/goals 2.  DVT Prophylaxis/Anticoagulation: Pharmaceutical: Lovenox 3. Pain Management: tylenol prn 4. Mood: LCSW to follow for evaluation and support.  5. Neuropsych: This patient is not capable of making decisions on his own behalf. 6. Skin/Wound Care: Routine pressure relief measures.  7. Fluids/Electrolytes/Nutrition: I personally reviewed the patient's labs today and they are normal.  -   Eating well  8. H/o  T2DM?: cbg's normal 9. Seizure disorder: on keppra bid 10. Psychoaffective disorder: On Depakote 500 mg bid and inderal qid.   Off lexapro at this time and mood stable.   -mood appropriate at present  11. Reactive leucocytosis: wbc's down to 11k.   LOS (Days) 5 A FACE TO FACE EVALUATION WAS PERFORMED  Vandell Kun T 04/27/2016 8:32 AM

## 2016-04-27 NOTE — Progress Notes (Signed)
Recreational Therapy Session Note  Patient Details  Name: Lynden AngDave Klatt MRN: [id number] Date of Birth: 02-Aug-1990 Today's Date: 04/27/2016  Pain: no c/o Skilled Therapeutic Interventions/Progress Updates: Pt participated in animal assisted activity/therapy seated w/c level with supervision.   Marius Betts 04/27/2016, 2:56 PM
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
## 2016-04-27 NOTE — Progress Notes (Signed)
Occupational Therapy Session Note  Patient Details  Name: Lari Zerby MRN: [id number] Date of Birth: [birth year]  Today's Date: 04/27/2016 OT Individual Time: 1045-1200 and 1400-1454 OT Individual Time Calculation (min): 75 min and 54 min    Short Term Goals:Week 1:  OT Short Term Goal 1 (Week 1): Focus on LTGs  Skilled Therapeutic Interventions/Progress Updates:    Session One:Pt seen for OT ADL bathing/dressing session. Pt sitting up in w/c upon arrival, unable to recall therapist or type of therapy. Once oriented, pt agreeable to showering task. He gathered clothing items from w/c level with 1 VC. He ambulated into bathroom with supervision, VC for RW management in functional sitting. He bathed seated on tub bench with supervision, standing with use of grab bars for steadying support.  He dressed seated in w/c, cues to stand with use of RW as pt with decreased awareness of deficits and fall risk.  He ambulated to sink and completed grooming tasks standing at sink, VCs for RW management.  He ambulated to therapy gym. Completed standing balance task without use of UE support. Pt tossing horseshoes, working on weight shifting and crossing midline. Occasional steadying assist required. He ambulated back to room at end of session, left seated in w/c ready for lunch, QRB donned.   Session Two: Pt seen for OT session focusing on functional standing balance, ambulation, and general activity tolerance. Pt sitting up in w/c upon arrival, voicing desire "to walk". He ambulated to therapy gym with mod questioning cues for environmental cues to navigate to gym. Completed standing ball toss activity without use of UEs for balance. Required overall close supervision- CGA. Pt with posterior LOB, requiring min-mod A, increased time and VCs for weight shift to regain balance.  He ambulated throughout unit using grocery cart to cont to challenge functional ambulation, completed with supervision and VCs. Completed  standing task utilizing [employer] with emphasis on dynamic standing balance, crossing midline, and reaching overhead/ below. Pt required max VCs to follow and recall specific directions. Standing balance supervision- min A. Pt ambulated back to room at end of session, left seated in w/c at end of session, QRB donned and all needs in reach.   Therapy Documentation Precautions:  Precautions Precautions: Fall Restrictions Weight Bearing Restrictions: No Pain:   No/ denies pain  See Function Navigator for Current Functional Status.   Therapy/Group: Individual Therapy  Lewis, Vear Staton C 04/27/2016, 7:16 AM

## 2016-04-28 ENCOUNTER — Inpatient Hospital Stay (HOSPITAL_COMMUNITY): Payer: Self-pay | Admitting: Physical Therapy

## 2016-04-28 ENCOUNTER — Inpatient Hospital Stay (HOSPITAL_COMMUNITY): Payer: Self-pay | Admitting: Occupational Therapy

## 2016-04-28 NOTE — Progress Notes (Addendum)
Occupational Therapy Discharge Summary  Patient Details  Name: Victor [name] MRN: [id number] Date of Birth: 1991-01-13   Patient has met 11 of 12 long term goals due to improved activity tolerance, improved balance, postural control and improved coordination.  Patient to discharge at overall Supervision level.  Patient's care partner is independent to provide the necessary physical and cognitive assistance at discharge.   Pt's uncle present for one bathing/dressing session, reports that pt's functional level, physically and cognitively, has returned to baseline.  Pt did not meet STM goal of min A for day to day recall. Due to pt's baseline cognitive impairments, pt requires max- total A for recall of new basic information.   Recommendation:  Patient will benefit from ongoing skilled OT services in home health setting to continue to advance functional skills in the area of BADL, iADL and Reduce care partner burden. Although recommended, pt does not qualify for follow up services due to [facility] insurance. Encouraged pt and his uncle (primary caregiver) to cont with mobility and building activity tolerance.  Equipment: No equipment provided. Pt has all needed equipment from prior admission  Reasons for discharge: treatment goals met and discharge from hospital  Patient/family agrees with progress made and goals achieved: Yes  OT Discharge Precautions/Restrictions  Precautions Precautions: Fall Restrictions Weight Bearing Restrictions: No Vision/Perception  Vision- History Baseline Vision/History: Wears glasses Wears Glasses: At all times Patient Visual Report: No change from baseline  Cognition Overall Cognitive Status: History of cognitive impairments - at baseline Arousal/Alertness: Awake/alert Attention: Selective Sustained Attention: Appears intact Selective Attention: Impaired Selective Attention Impairment: Verbal basic;Functional basic Memory: Impaired Memory Impairment:  Decreased short term memory;Decreased recall of new information Decreased Short Term Memory: Verbal basic;Functional basic Awareness: Impaired Awareness Impairment: Intellectual impairment Problem Solving: Impaired Problem Solving Impairment: Verbal basic;Functional basic Executive Function: Initiating;Decision Making;Reasoning Reasoning: Impaired Reasoning Impairment: Verbal basic;Functional basic Decision Making: Impaired Decision Making Impairment: Verbal basic;Functional basic Initiating: Impaired Initiating Impairment: Verbal basic;Functional basic Safety/Judgment: Impaired Comments: Pt with decreased STM, awareness, and basic problem solving. Baseline cognitive deficits. Pt's family reports pt is functioning at baseline level Sensation Sensation Light Touch: Not tested Hot/Cold: Appears Intact Additional Comments: Unable to formally assess due to cognition Coordination Gross Motor Movements are Fluid and Coordinated: No Fine Motor Movements are Fluid and Coordinated: No (However, WFL for tasks assessed) Coordination and Movement Description: impaired motor planning Motor  Motor Motor: Abnormal tone;Abnormal postural alignment and control;Motor apraxia Trunk/Postural Assessment  Cervical Assessment Cervical Assessment: Within Functional Limits Thoracic Assessment Thoracic Assessment: Exceptions to [facility] (Kyphotic) Lumbar Assessment Lumbar Assessment: Exceptions to [facility] (Posterior pelvic tilt) Postural Control Postural Control: Deficits on evaluation  Balance Balance Balance Assessed: Yes Static Standing Balance Static Standing - Balance Support: During functional activity;Right upper extremity supported;Left upper extremity supported Static Standing - Level of Assistance: 5: Stand by assistance Dynamic Standing Balance Dynamic Standing - Balance Support: During functional activity Dynamic Standing - Level of Assistance: 5: Stand by assistance;4: Min  assist Extremity/Trunk Assessment RUE Assessment RUE Assessment: Within Functional Limits LUE Assessment LUE Assessment: Within Functional Limits   See Function Navigator for Current Functional Status.  Lewis, Tymia Streb C 04/28/2016, 4:19 PM

## 2016-04-28 NOTE — Progress Notes (Signed)
Physical Therapy Session Note  Patient Details  Name: Victor [name] MRN: [id number] Date of Birth: 12-14-1990  Today's Date: 04/28/2016 PT Individual Time: 0730-0900 and 1435-1505 PT Individual Time Calculation (min): 90 min and 30 min (total 120 min)    Short Term Goals: Week 1:  PT Short Term Goal 1 (Week 1): Pt will amb with LRAD x150  PT Short Term Goal 2 (Week 1): Pt will demonstrate dynamic standing balance with min assist PT Short Term Goal 3 (Week 1): Pt will negotiate 12 steps with min assist for strengthening  Skilled Therapeutic Interventions/Progress Updates:   Tx 1: Pt received seated in bed, denies pain. Long sitting>short sitting on EOB modI. Upper/lower body dressing performed EOB with close S and cues for safety to don shoes before standing in socks; close S for standing balance. Gait within room with RW and S; standing at sink to perform hygiene with distant S and setupA to retrieve items. Seated in w/c, pt performed self feeding; required setupA due to decreased fine motor control and pt dropping items on the floor, unable to retrieve from sitting in w/c. Dynamic standing balance with RW and S while retrieving clothes from low drawer to place into a bag and prepare to do laundry. Gait to gym with RW and S x170'. Static standing balance with slow, sustained external perturbation to B hips to engage anterior weight shift and closed chain ankle dorsiflexion. Static standing balance on airex pad to train ankle strategy and prevent posterior LOB. BLE stretching performed due to crouched gait pattern; standing gastroc stretch on 3" step with BUE support on rails. Supine and prone hip flexor stretch with pt difficulty reporting where/if he felt stretch effectively due to poor body awareness. Standing heel raises 1x10' with BUE support on RW; heavy reliance on UEs for balance. Returned to room with gait and RW x170' with S. Remained seated in w/c with quick release belt intact and all needs in  reach.   Tx 2:Pt received seated in w/c, denies pain and agreeable to treatment. Gait to/from gym with RW and S x175' per trial. Nustep x10 min total level 5 with BUE/BLE with average 45 steps/min; performed for aerobic endurance and strengthening. Gait x30' without AD and min guard/minA with improving balance confidence and gait parameters including gait speed, decreased BOS. Stairs 1x12 on 6" stairs with B handrails and S. Gait to return to room with RW and S. Remained seated in w/c at end of session, all needs in reach.   Therapy Documentation Precautions:  Precautions Precautions: Fall Restrictions Weight Bearing Restrictions: No Pain: Pain Assessment Pain Assessment: No/denies pain   See Function Navigator for Current Functional Status.   Therapy/Group: Individual Therapy  Vista Lawmanlizabeth J Tygielski 04/28/2016, 9:00 AM
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
## 2016-04-28 NOTE — Plan of Care (Signed)
Problem: RH Memory Goal: LTG Patient will demonstrate ability for day to day (OT) LTG:  Patient will demonstrate ability for day to day recall/carryover during activities of daily living with assist  (OT)  Outcome: Not Met (add Reason) Pt with baseline cognitive memory impairments. Requires max- total A for daily recall. - AL

## 2016-04-28 NOTE — Progress Notes (Signed)
[facility] PHYSICAL MEDICINE & REHABILITATION     PROGRESS NOTE    Subjective/Complaints: No new issues. Slept well. No complaints today  ROS: Pt denies fever, rash/itching, headache, blurred or double vision, nausea, vomiting, abdominal pain, diarrhea, chest pain, shortness of breath, palpitations, dysuria, dizziness, neck or back pain, bleeding, anxiety, or depression   Objective: Vital Signs: Blood pressure 140/62, pulse 84, temperature 98.5 F (36.9 C), temperature source Oral, resp. rate 18, height 5' 9.8" (1.773 m), weight 112.4 kg (247 lb 12.8 oz), SpO2 98 %. No results found. No results for input(s): WBC, HGB, HCT, PLT in the last 72 hours. No results for input(s): NA, K, CL, GLUCOSE, BUN, CREATININE, CALCIUM in the last 72 hours.  Invalid input(s): CO CBG (last 3)  No results for input(s): GLUCAP in the last 72 hours.  Wt Readings from Last 3 Encounters:  04/27/16 112.4 kg (247 lb 12.8 oz)  04/19/16 121.8 kg (268 lb 8.3 oz)  06/25/14 102.5 kg (226 lb)    Physical Exam:  Constitutional: He appears well-developed and well-nourished.  Up in chair.      HENT:  Mouth/Throat: Oropharynx is clear and moist.  Right crani incision well healed with minimal depression.   Eyes: Conjunctivae are normal. Pupils are equal, round, and reactive to light.  Neck: Normal range of motion. Neck supple.  Cardiovascular: Normal rate and regular rhythm.   Respiratory: Effort normal and breath sounds normal. No stridor. He has no wheezes. He has no rales.  GI: Soft. Bowel sounds are normal. He exhibits no distension. There is no tenderness.  Healed scars.   Musculoskeletal: He exhibits no edema or tenderness.  old fasciotomy scars on right calf.    Neurological: He is alert.  Oriented to self and place.  Limited insight/understanding of current deficits. Doesn't recall who I am, even 5 minutes after being reminded. Able to follow basic commands without difficulty.  Spasticity noted  RUE/RLE, trace to 1/4 throughout--without change Motor: 4+/5 throughout, except right ankle dorsiflexion 3-/5   Skin: Skin is warm and dry.  Psychiatric:  Pleasant and cooperative.    Assessment/Plan: 1. Functional, cognitive, and mobility deficits secondary to new onset multiple sclerosis which require 3+ hours per day of interdisciplinary therapy in a comprehensive inpatient rehab setting. Physiatrist is providing close team supervision and 24 hour management of active medical problems listed below. Physiatrist and rehab team continue to assess barriers to discharge/monitor patient progress toward functional and medical goals.  Function:  Bathing Bathing position   Position: Shower  Bathing parts Body parts bathed by patient: Right arm, Left arm, Chest, Abdomen, Front perineal area, Right upper leg, Left upper leg, Right lower leg, Left lower leg, Buttocks Body parts bathed by helper: Back  Bathing assist Assist Level: Touching or steadying assistance(Pt > 75%)      Upper Body Dressing/Undressing Upper body dressing   What is the patient wearing?: Pull over shirt/dress     Pull over shirt/dress - Perfomed by patient: Thread/unthread left sleeve, Thread/unthread right sleeve, Put head through opening, Pull shirt over trunk Pull over shirt/dress - Perfomed by helper: Pull shirt over trunk        Upper body assist Assist Level: More than reasonable time      Lower Body Dressing/Undressing Lower body dressing   What is the patient wearing?: Pants, Socks, Shoes Underwear - Performed by patient: Thread/unthread right underwear leg, Thread/unthread left underwear leg, Pull underwear up/down Underwear - Performed by helper: Thread/unthread right underwear leg,  Thread/unthread left underwear leg Pants- Performed by patient: Thread/unthread right pants leg, Thread/unthread left pants leg, Pull pants up/down   Non-skid slipper socks- Performed by patient: Don/doff right sock, Don/doff  left sock   Socks - Performed by patient: Don/doff right sock, Don/doff left sock   Shoes - Performed by patient: Don/doff right shoe, Don/doff left shoe (Elastic shoe laces) Shoes - Performed by helper: Fasten right, Fasten left          Lower body assist Assist for lower body dressing: Supervision or verbal cues      Toileting Toileting   Toileting steps completed by patient: Adjust clothing prior to toileting, Adjust clothing after toileting Toileting steps completed by helper: Performs perineal hygiene Toileting Assistive Devices: Grab bar or rail  Toileting assist Assist level: Touching or steadying assistance (Pt.75%)   Transfers Chair/bed transfer   Chair/bed transfer method: Stand pivot Chair/bed transfer assist level: Supervision or verbal cues Chair/bed transfer assistive device: Armrests     Locomotion Ambulation     Max distance: 175 Assist level: Touching or steadying assistance (Pt > 75%)   Wheelchair   Type: Manual Max wheelchair distance: 150 Assist Level: Supervision or verbal cues  Cognition Comprehension Comprehension assist level: Understands basic 25 - 49% of the time/ requires cueing 50 - 75% of the time  Expression Expression assist level: Expresses basic 50 - 74% of the time/requires cueing 25 - 49% of the time. Needs to repeat parts of sentences.  Social Interaction Social Interaction assist level: Interacts appropriately 50 - 74% of the time - May be physically or verbally inappropriate.  Problem Solving Problem solving assist level: Solves basic 25 - 49% of the time - needs direction more than half the time to initiate, plan or complete simple activities  Memory Memory assist level: Recognizes or recalls less than 25% of the time/requires cueing greater than 75% of the time   Medical Problem List and Plan: 1.  Functional, cognitive and mobility deficits secondary to new onset multiple sclerosis  -continue CIR therapies  -progressing toward  10/6 dc 2.  DVT Prophylaxis/Anticoagulation: Pharmaceutical: Lovenox 3. Pain Management: tylenol prn 4. Mood: LCSW to follow for evaluation and support.  5. Neuropsych: This patient is not capable of making decisions on his own behalf. 6. Skin/Wound Care: Routine pressure relief measures.  7. Fluids/Electrolytes/Nutrition: I personally reviewed the patient's labs today and they are normal.  -   Eating well  8. H/o  T2DM?: cbg's normal 9. Seizure disorder: on keppra bid 10. Psychoaffective disorder: On Depakote 500 mg bid and inderal qid.   Off lexapro at this time and mood stable.   -mood appropriate at present  11. Reactive leucocytosis: wbc's down to 11k.   LOS (Days) 6 A FACE TO FACE EVALUATION WAS PERFORMED  SWARTZ,ZACHARY T 04/28/2016 8:50 AM

## 2016-04-28 NOTE — Progress Notes (Signed)
Social Work Patient ID: Victor [name], male   DOB: [birth year], 25 y.o.   MRN: [id number]  Have reviewed team conference info with pt and with uncle, Ginnie Smart.  Both aware and agreeable with targeted d/c date of 10/6 and supervision goals.  Still trying to confirm who is pt's primary medical provider.  Also investigating if community case management is an option.  Continue to follow.  Skyelynn Rambeau, LCSW

## 2016-04-28 NOTE — Progress Notes (Addendum)
Recreational Therapy Discharge Summary Patient Details  Name: Felicia Both MRN: [id number] Date of Birth: [birth year] Today's Date: 04/28/2016  Long term goals set: 1  Long term goals met: 1  Comments on progress toward goals: Pt has made good progress toward goal and is ready for discharge tomorrow at overall set up/supervision level for simple TR tasks seated.  Pt may require verbal cuing for decreased safety awareness, decreased problem solving & decreased memory.  Reasons for discharge: discharge from hospital  Patient/family agrees with progress made and goals achieved: Yes  Abbie Berling 04/28/2016, 10:13 AM

## 2016-04-28 NOTE — Progress Notes (Signed)
Occupational Therapy Session Note  Patient Details  Name: Victor [name] MRN: [id number] Date of Birth: October 18, 1990  Today's Date: 04/28/2016 OT Individual Time: 1030-1200 OT Individual Time Calculation (min): 90 min     Short Term Goals:Week 1:  OT Short Term Goal 1 (Week 1): Focus on LTGs  Skilled Therapeutic Interventions/Progress Updates:    Pt seen for OT bathing/dressing session. Pt received in w/c at nurses station asking therapist "are you the one that's going to help me walk?" Pt self propelled w/c back to room and gathered clothing items in prep for shower. He ambulated into bathroom with supervision using RW, required VCs for RW management when turning to sit. He bathed seated on tub bench, standing with use of grab bars and supporting head on wall for dynamic balance while buttock hygiene he completed. He returned to w/c and dressed, standing with RW to pull pants up. Grooming completed standing at sink.  He ambulated throughout unit to complete laundry task with supervision and VCs. In therapy gym, completed pipe tree activity, pt able to complete 2 simple structures with increased time, seated rest break provided btwn tasks.  In ADL apartment, completed simulated tub/shower transfer utilizing bench, completed with supervision.  Pt ambulated back to room at end of session, left seated in w/c with QRB donned and all needs in reach.   Therapy Documentation Precautions:  Precautions Precautions: Fall Restrictions Weight Bearing Restrictions: No Pain: Pain Assessment Pain Assessment: No/denies pain  See Function Navigator for Current Functional Status.   Therapy/Group: Individual Therapy  Lewis, Luisdavid Hamblin C 04/28/2016, 7:06 AM

## 2016-04-28 NOTE — Progress Notes (Signed)
Physical Therapy Discharge Summary  Patient Details  Name: Victor [name] MRN: [id number] Date of Birth: 03-04-1991  Today's Date: 04/29/2016 PT Individual Time: 1000-1100 PT Individual Time Calculation (min): 60 min     Patient has met 11 of 11 long term goals due to improved activity tolerance, improved balance, improved postural control, ability to compensate for deficits and functional use of  right lower extremity and left lower extremity.  Patient to discharge at an ambulatory level Supervision.   Patient's care partner is independent to provide the necessary physical and cognitive assistance at discharge. Pt's uncle did not attend any physical therapy sessions, however pt is at baseline level cognitively and physically and requires assist as needed before admission.   Reasons goals not met: All goals met  Recommendation:  Patient will benefit from ongoing skilled PT services in home health setting to continue to advance safe functional mobility, address ongoing impairments in strength, balance, coordination, activity tolerance, and minimize fall risk. However, pt will not qualify for follow up physical therapy due to insurance limitations; pt has been provided with HEP to continue building strength, balance and coordination for carryover into safe mobility in the home and community.   Equipment: No equipment provided  Reasons for discharge: treatment goals met and discharge from hospital  Patient/family agrees with progress made and goals achieved: Yes  PT Discharge Precautions/RestrictionsPrecautions Precautions: Fall Restrictions Weight Bearing Restrictions: No Vision/Perception   glaucoma; wears glasses  Cognition Overall Cognitive Status: History of cognitive impairments - at baseline Arousal/Alertness: Awake/alert Attention: Selective Sustained Attention: Appears intact Selective Attention: Impaired Selective Attention Impairment: Verbal basic;Functional basic Memory:  Impaired Memory Impairment: Decreased short term memory;Decreased recall of new information Decreased Short Term Memory: Verbal basic;Functional basic Awareness: Impaired Awareness Impairment: Intellectual impairment Problem Solving: Impaired Problem Solving Impairment: Verbal basic;Functional basic Executive Function: Initiating;Decision Making;Reasoning Reasoning: Impaired Reasoning Impairment: Verbal basic;Functional basic Decision Making: Impaired Decision Making Impairment: Verbal basic;Functional basic Initiating: Impaired Initiating Impairment: Verbal basic;Functional basic Safety/Judgment: Impaired Comments: Pt with decreased STM, awareness, and basic problem solving. Baseline cognitive deficits. Pt's family reports pt is functioning at baseline level Sensation Sensation Light Touch: Not tested Hot/Cold: Appears Intact Additional Comments: Unable to formally assess due to cognition Coordination Gross Motor Movements are Fluid and Coordinated: No Fine Motor Movements are Fluid and Coordinated: No (However, WFL for tasks assessed) Coordination and Movement Description: impaired motor planning Motor  Motor Motor: Abnormal tone;Abnormal postural alignment and control;Motor apraxia  Mobility Bed Mobility Bed Mobility: Sit to Supine;Supine to Sit Supine to Sit: 6: Modified independent (Device/Increase time) Sit to Supine: 6: Modified independent (Device/Increase time) Transfers Transfers: Yes Sit to Stand: 5: Supervision;With armrests Sit to Stand Details: Verbal cues for precautions/safety;Verbal cues for technique Stand to Sit: 5: Supervision;With upper extremity assist Stand to Sit Details (indicate cue type and reason): Verbal cues for precautions/safety;Verbal cues for technique Stand Pivot Transfers: 5: Supervision Stand Pivot Transfer Details: Verbal cues for sequencing;Verbal cues for technique;Verbal cues for safe use of DME/AE Locomotion  Ambulation Ambulation:  Yes Ambulation/Gait Assistance: 5: Supervision Ambulation Distance (Feet): 300 Feet Assistive device: Rolling walker Ambulation/Gait Assistance Details: Verbal cues for precautions/safety;Verbal cues for safe use of DME/AE Ambulation/Gait Assistance Details: cues for maintaining RW closer to body, and narrow BOS to prevent LEs from kicking RW legs Gait Gait: Yes Gait Pattern: Impaired Gait Pattern: Shuffle;Ataxic;Decreased trunk rotation;Poor foot clearance - left;Poor foot clearance - right;Wide base of support;Trunk flexed;Decreased stride length;Right flexed knee in stance;Left flexed knee in stance;Right foot  flat;Left foot flat Gait velocity: decreased for age/gender norms Stairs / Additional Locomotion Stairs: Yes Stairs Assistance: 5: Supervision Stairs Assistance Details: Verbal cues for precautions/safety Stair Management Technique: Two rails;Step to pattern;Forwards Number of Stairs: 12 Height of Stairs: 6 Ramp: 5: Supervision Curb: 5: Psychiatric nurse: Yes Wheelchair Assistance: 6: Modified independent (Device/Increase time) Environmental health practitioner: Both lower extermities;Both upper extremities Wheelchair Parts Management: Needs assistance (d/t visual deficits) Distance: 150  Trunk/Postural Assessment  Cervical Assessment Cervical Assessment: Within Functional Limits Thoracic Assessment Thoracic Assessment: Exceptions to [facility] (Kyphotic) Lumbar Assessment Lumbar Assessment: Exceptions to [facility] (Posterior pelvic tilt) Postural Control Postural Control: Deficits on evaluation  Balance Balance Balance Assessed: Yes Dynamic Sitting Balance Dynamic Sitting - Balance Support: During functional activity Dynamic Sitting - Level of Assistance: 6: Modified independent (Device/Increase time) Static Standing Balance Static Standing - Balance Support: Bilateral upper extremity supported Static Standing - Level of Assistance: 5: Stand by  assistance Static Stance: Eyes closed Static Stance: Eyes Closed: standbyA x30 sec Static Stance: on Foam: min guard x30 sec with posterior bias and poor recruitment of ankle strategy for righting Dynamic Standing Balance Dynamic Standing - Balance Support: Bilateral upper extremity supported;During functional activity Dynamic Standing - Level of Assistance: 5: Stand by assistance Dynamic Standing - Balance Activities: Lateral lean/weight shifting;Forward lean/weight shifting;Reaching across midline Extremity Assessment  RUE Assessment RUE Assessment: Within Functional Limits LUE Assessment LUE Assessment: Within Functional Limits RLE Assessment RLE Assessment: Within Functional Limits RLE Strength Right Hip Flexion: 4/5 Right Knee Flexion: 4+/5 Right Knee Extension: 4+/5 Right Ankle Dorsiflexion: 3+/5 Right Ankle Plantar Flexion: 3+/5 LLE Assessment LLE Assessment: Within Functional Limits LLE Strength Left Hip Flexion: 4/5 Left Knee Flexion: 4+/5 Left Knee Extension: 4+/5 Left Ankle Dorsiflexion: 3+/5 Left Ankle Plantar Flexion: 3+/5  Skilled Therapeutic Intervention: Pt received seated in w/c, denies pain and agreeable to treatment. Assessed all mobility as described above with S overall to cue for safety and problem solving. Pt uses RW to ambulate and perform sit <>stand for functional activities. Pt assisted with taking a shower as clothes were wet with what appeared to be urine; pt denies it was urine however does report "I do need a shower today". Pt required setupA for shower and dressing, and S for standing balance with pt using grab bars and head on the wall for balance as he reports he did prior to admission. Pt remained seated in w/c at end of session, all needs in reach.   See Function Navigator for Current Functional Status.  Benjiman Core Tygielski 04/28/2016, 7:46 PM

## 2016-04-29 ENCOUNTER — Ambulatory Visit (HOSPITAL_COMMUNITY): Payer: Self-pay | Admitting: Physical Therapy

## 2016-04-29 NOTE — Progress Notes (Signed)
[place] PHYSICAL MEDICINE & REHABILITATION     PROGRESS NOTE    Subjective/Complaints: Up at nurses station. No new complaints  ROS: Pt denies fever, rash/itching, headache, blurred or double vision, nausea, vomiting, abdominal pain, diarrhea, chest pain, shortness of breath, palpitations, dysuria, dizziness, neck or back pain, bleeding, anxiety, or depression   Objective: Vital Signs: Blood pressure 129/73, pulse 60, temperature 98.4 F (36.9 C), temperature source Oral, resp. rate 18, height 5' 9.8" (1.773 m), weight 112.4 kg (247 lb 12.8 oz), SpO2 100 %. No results found. No results for input(s): WBC, HGB, HCT, PLT in the last 72 hours. No results for input(s): NA, K, CL, GLUCOSE, BUN, CREATININE, CALCIUM in the last 72 hours.  Invalid input(s): CO CBG (last 3)  No results for input(s): GLUCAP in the last 72 hours.  Wt Readings from Last 3 Encounters:  04/27/16 112.4 kg (247 lb 12.8 oz)  04/19/16 121.8 kg (268 lb 8.3 oz)  06/25/14 102.5 kg (226 lb)    Physical Exam:  Constitutional: He appears well-developed and well-nourished.  Up in chair.      HENT:  Mouth/Throat: Oropharynx is clear and moist.  Right crani incision well healed with minimal depression.   Eyes: Conjunctivae are normal. Pupils are equal, round, and reactive to light.  Neck: Normal range of motion. Neck supple.  Cardiovascular: Normal rate and regular rhythm.   Respiratory: Effort normal and breath sounds normal. No stridor. He has no wheezes. He has no rales.  GI: Soft. Bowel sounds are normal. He exhibits no distension. There is no tenderness.  Healed scars.   Musculoskeletal: He exhibits no edema or tenderness.  old fasciotomy scars on right calf.    Neurological: He is alert.  Oriented to self and place.  Limited insight/understanding of current deficits. Doesn'[name] recall who I am, even 5 minutes after being reminded. Able to follow basic commands without difficulty.  Spasticity noted RUE/RLE,  trace to 1/4 throughout--without change Motor: 4+/5 throughout, except right ankle dorsiflexion 3-/5   Skin: Skin is warm and dry.  Psychiatric:  Pleasant and cooperative.    Assessment/Plan: 1. Functional, cognitive, and mobility deficits secondary to new onset multiple sclerosis which require 3+ hours per day of interdisciplinary therapy in a comprehensive inpatient rehab setting. Physiatrist is providing close team supervision and 24 hour management of active medical problems listed below. Physiatrist and rehab team continue to assess barriers to discharge/monitor patient progress toward functional and medical goals.  Function:  Bathing Bathing position   Position: Shower  Bathing parts Body parts bathed by patient: Right arm, Left arm, Chest, Abdomen, Front perineal area, Right upper leg, Left upper leg, Right lower leg, Left lower leg, Buttocks, Back Body parts bathed by helper: Back  Bathing assist Assist Level: Supervision or verbal cues      Upper Body Dressing/Undressing Upper body dressing   What is the patient wearing?: Pull over shirt/dress     Pull over shirt/dress - Perfomed by patient: Thread/unthread left sleeve, Thread/unthread right sleeve, Put head through opening, Pull shirt over trunk Pull over shirt/dress - Perfomed by helper: Pull shirt over trunk        Upper body assist Assist Level: More than reasonable time      Lower Body Dressing/Undressing Lower body dressing   What is the patient wearing?: Pants, Socks, Shoes Underwear - Performed by patient: Thread/unthread right underwear leg, Thread/unthread left underwear leg, Pull underwear up/down Underwear - Performed by helper: Thread/unthread right underwear leg, Thread/unthread left  underwear leg Pants- Performed by patient: Thread/unthread right pants leg, Thread/unthread left pants leg, Pull pants up/down   Non-skid slipper socks- Performed by patient: Don/doff right sock, Don/doff left sock    Socks - Performed by patient: Don/doff right sock, Don/doff left sock   Shoes - Performed by patient: Don/doff right shoe, Don/doff left shoe Shoes - Performed by helper: Fasten right, Fasten left          Lower body assist Assist for lower body dressing: Supervision or verbal cues      Toileting Toileting   Toileting steps completed by patient: Adjust clothing prior to toileting, Performs perineal hygiene, Adjust clothing after toileting Toileting steps completed by helper: Performs perineal hygiene Toileting Assistive Devices: Grab bar or rail  Toileting assist Assist level: Supervision or verbal cues   Transfers Chair/bed transfer   Chair/bed transfer method: Stand pivot Chair/bed transfer assist level: Supervision or verbal cues Chair/bed transfer assistive device: Medical sales representative     Max distance: 170 Assist level: Supervision or verbal cues   Wheelchair   Type: Manual Max wheelchair distance: 150 Assist Level: Supervision or verbal cues  Cognition Comprehension Comprehension assist level: Understands basic 25 - 49% of the time/ requires cueing 50 - 75% of the time  Expression Expression assist level: Expresses basic 50 - 74% of the time/requires cueing 25 - 49% of the time. Needs to repeat parts of sentences.  Social Interaction Social Interaction assist level: Interacts appropriately 50 - 74% of the time - May be physically or verbally inappropriate.  Problem Solving Problem solving assist level: Solves basic 25 - 49% of the time - needs direction more than half the time to initiate, plan or complete simple activities  Memory Memory assist level: Recognizes or recalls 25 - 49% of the time/requires cueing 50 - 75% of the time   Medical Problem List and Plan: 1.  Functional, cognitive and mobility deficits secondary to new onset multiple sclerosis  -goals met  -dc home today  -follow up with me in about a month 2.  DVT  Prophylaxis/Anticoagulation: Pharmaceutical: Lovenox 3. Pain Management: tylenol prn 4. Mood: LCSW to follow for evaluation and support.  5. Neuropsych: This patient is not capable of making decisions on his own behalf. 6. Skin/Wound Care: Routine pressure relief measures.  7. Fluids/Electrolytes/Nutrition: I personally reviewed the patient's labs today and they are normal.  -   Eating well  8. H/o  T2DM?: cbg's normal 9. Seizure disorder: on keppra bid 10. Psychoaffective disorder: On Depakote 500 mg bid and inderal qid.   Off lexapro at this time and mood stable.   -mood appropriate at present  11. Reactive leucocytosis: wbc's down to 11k.   LOS (Days) 7 A FACE TO FACE EVALUATION WAS PERFORMED  Victor [name] 04/29/2016 9:05 AM

## 2016-04-29 NOTE — Progress Notes (Signed)
Social Work  Discharge Note  The overall goal for the admission was met for:   Discharge location: Yes - returning home with uncle and other family to provide 24/7 supervision  Length of Stay: Yes - 7 days  Discharge activity level: Yes - supervision  Home/community participation: Yes - supervision  Services provided included: MD, RD, PT, OT, RN, TR, Pharmacy and SW  Financial Services: Medicaid  Follow-up services arranged: Other: Referral placed with [facility] to re-start RN CM community case management services.  (they had followed pt prior to 2015 hospitalization)  Comments (or additional information):  While CIR therapies had recommended follow up HHPT and OT, pt does not have a qualifying diagnosis under Medicaid for these services.  Pt/ family unable to privately pay for services.    Have provided pt/uncle with information on [employer] and services.  Patient/Family verbalized understanding of follow-up arrangements: Yes  Individual responsible for coordination of the follow-up plan: pt/ uncle  Confirmed correct DME delivered: NA  Ramez Arrona

## 2016-04-29 NOTE — Discharge Instructions (Signed)
Inpatient Rehab Discharge Instructions  Victor AngDave [name] Discharge date and time:  04/29/16  Activities/Precautions/ Functional Status: Activity: activity as tolerated with supervision Diet: diabetic diet Wound Care: none needed   Functional status:  ___ No restrictions     ___ Walk up steps independently _X__ 24/7 supervision/assistance   ___ Walk up steps with assistance ___ Intermittent supervision/assistance  ___ Bathe/dress independently ___ Walk with walker     _X__ Bathe/dress with assistance ___ Walk Independently    ___ Shower independently ___ Walk with assistance    ___ Shower with assistance _X__ No alcohol     ___ Return to work/school ________    COMMUNITY REFERRALS UPON DISCHARGE:    Other:  [facility] will contact you to restart Community Case Manager support services              [phone]    GENERAL COMMUNITY RESOURCES FOR PATIENT/FAMILY:  Support Groups: [employer] Support Groups  (handout)      Special Instructions:    My questions have been answered and I understand these instructions. I will adhere to these goals and the provided educational materials after my discharge from the hospital.  Patient/Caregiver Signature _______________________________ Date __________  Clinician Signature _______________________________________ Date __________  Please bring this form and your medication list with you to all your follow-up doctor's appointments.

## 2016-04-29 NOTE — Progress Notes (Signed)
Patient and family received discharge instructions from Marissa Nestle, PA-C with verbal understanding. Victor [name] to order seat for over toilet but patient and family decided not to wait. Patient discharged to home with family and belongings.

## 2016-06-15 ENCOUNTER — Encounter: Payer: Self-pay | Admitting: Physical Medicine & Rehabilitation

## 2016-06-15 ENCOUNTER — Encounter: Payer: Medicaid Other | Attending: Physical Medicine & Rehabilitation | Admitting: Physical Medicine & Rehabilitation

## 2016-06-15 VITALS — BP 123/77 | HR 70

## 2016-06-15 DIAGNOSIS — E119 Type 2 diabetes mellitus without complications: Secondary | ICD-10-CM | POA: Insufficient documentation

## 2016-06-15 DIAGNOSIS — G40909 Epilepsy, unspecified, not intractable, without status epilepticus: Secondary | ICD-10-CM | POA: Insufficient documentation

## 2016-06-15 DIAGNOSIS — Z87891 Personal history of nicotine dependence: Secondary | ICD-10-CM | POA: Insufficient documentation

## 2016-06-15 DIAGNOSIS — Z9889 Other specified postprocedural states: Secondary | ICD-10-CM | POA: Insufficient documentation

## 2016-06-15 DIAGNOSIS — G35 Multiple sclerosis: Secondary | ICD-10-CM

## 2016-06-15 DIAGNOSIS — G5791 Unspecified mononeuropathy of right lower limb: Secondary | ICD-10-CM

## 2016-06-15 DIAGNOSIS — I10 Essential (primary) hypertension: Secondary | ICD-10-CM | POA: Insufficient documentation

## 2016-06-15 DIAGNOSIS — R269 Unspecified abnormalities of gait and mobility: Secondary | ICD-10-CM

## 2016-06-15 DIAGNOSIS — Z5189 Encounter for other specified aftercare: Secondary | ICD-10-CM | POA: Diagnosis present
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
## 2016-06-15 NOTE — Patient Instructions (Signed)
TO WORK ON LIFTING YOUR LEGS AND TOES WHEN YOU SWING YOUR LEG-----STRIKE WITH YOUR HEEL-----NO SHUFFLING!!!   PLEASE CALL ME WITH ANY PROBLEMS OR QUESTIONS [phone])   HAPPY THANKSGIVING!!!!

## 2016-07-14 ENCOUNTER — Ambulatory Visit: Payer: Self-pay | Admitting: Neurology

## 2016-07-14 ENCOUNTER — Encounter: Payer: Self-pay | Admitting: Neurology

## 2016-09-14 ENCOUNTER — Encounter
Payer: No Typology Code available for payment source | Attending: Physical Medicine & Rehabilitation | Admitting: Physical Medicine & Rehabilitation

## 2016-09-14 ENCOUNTER — Ambulatory Visit: Payer: Self-pay | Admitting: Physical Medicine & Rehabilitation

## 2016-10-04 ENCOUNTER — Other Ambulatory Visit (INDEPENDENT_AMBULATORY_CARE_PROVIDER_SITE_OTHER): Payer: Medicaid Other

## 2016-10-04 ENCOUNTER — Ambulatory Visit (INDEPENDENT_AMBULATORY_CARE_PROVIDER_SITE_OTHER): Payer: Medicaid Other | Admitting: Neurology

## 2016-10-04 ENCOUNTER — Encounter: Payer: Self-pay | Admitting: Neurology

## 2016-10-04 VITALS — BP 104/70 | HR 67 | Ht 69.8 in | Wt 223.7 lb

## 2016-10-04 DIAGNOSIS — S069X4D Unspecified intracranial injury with loss of consciousness of 6 hours to 24 hours, subsequent encounter: Secondary | ICD-10-CM

## 2016-10-04 DIAGNOSIS — R4189 Other symptoms and signs involving cognitive functions and awareness: Secondary | ICD-10-CM

## 2016-10-04 DIAGNOSIS — G35 Multiple sclerosis: Secondary | ICD-10-CM

## 2016-10-04 DIAGNOSIS — G40909 Epilepsy, unspecified, not intractable, without status epilepticus: Secondary | ICD-10-CM | POA: Diagnosis not present

## 2016-10-04 LAB — HEPATIC FUNCTION PANEL
ALK PHOS: 43 U/L (ref 39–117)
ALT: 21 U/L (ref 0–53)
AST: 18 U/L (ref 0–37)
Albumin: 4.1 g/dL (ref 3.5–5.2)
BILIRUBIN DIRECT: 0 mg/dL (ref 0.0–0.3)
BILIRUBIN TOTAL: 0.2 mg/dL (ref 0.2–1.2)
TOTAL PROTEIN: 6.9 g/dL (ref 6.0–8.3)

## 2016-10-04 LAB — VITAMIN D 25 HYDROXY (VIT D DEFICIENCY, FRACTURES): VITD: 11.83 ng/mL — ABNORMAL LOW (ref 30.00–100.00)

## 2016-10-04 LAB — CBC WITH DIFFERENTIAL/PLATELET
BASOS ABS: 0 10*3/uL (ref 0.0–0.1)
BASOS PCT: 0.7 % (ref 0.0–3.0)
EOS PCT: 4.6 % (ref 0.0–5.0)
Eosinophils Absolute: 0.3 10*3/uL (ref 0.0–0.7)
HEMATOCRIT: 46.3 % (ref 39.0–52.0)
Hemoglobin: 14.8 g/dL (ref 13.0–17.0)
LYMPHS PCT: 33.8 % (ref 12.0–46.0)
Lymphs Abs: 2.3 10*3/uL (ref 0.7–4.0)
MCHC: 32 g/dL (ref 30.0–36.0)
MCV: 81.4 fl (ref 78.0–100.0)
MONOS PCT: 6.1 % (ref 3.0–12.0)
Monocytes Absolute: 0.4 10*3/uL (ref 0.1–1.0)
NEUTROS ABS: 3.8 10*3/uL (ref 1.4–7.7)
Neutrophils Relative %: 54.8 % (ref 43.0–77.0)
PLATELETS: 163 10*3/uL (ref 150.0–400.0)
RBC: 5.69 Mil/uL (ref 4.22–5.81)
RDW: 13.9 % (ref 11.5–15.5)
WBC: 6.8 10*3/uL (ref 4.0–10.5)
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
## 2016-10-04 NOTE — Patient Instructions (Addendum)
1.  I would like to start Tysabri but we need to check some labs first.  If this is not an option, we will try Tecfidera.  Please review information about both medications. 2.  We will check hepatic panel, cbc with differential, vitamin D level and JC virus antibody with Index. 3.  Follow up in 6 months (after starting Tysabri).  Repeat MRI of brain/cervical/thoracic spine with and without contrast, hepatic panel and JC virus antibody with index in 6 months (about 1 week prior to follow up).

## 2016-10-05 ENCOUNTER — Other Ambulatory Visit: Payer: Self-pay

## 2016-10-05 ENCOUNTER — Telehealth: Payer: Self-pay

## 2016-10-05 DIAGNOSIS — G35 Multiple sclerosis: Secondary | ICD-10-CM

## 2016-10-05 NOTE — Telephone Encounter (Signed)
Called patient to give lab results. No answer. Left vmail.   

## 2016-10-05 NOTE — Telephone Encounter (Signed)
-----   Message from Drema Dallas, DO sent at 10/04/2016  6:47 PM EDT ----- Vitamin D level is low.  Low vitamin D may be protective against MS. I want him to start taking D3 50,000 IU weekly.

## 2016-10-06 NOTE — Telephone Encounter (Signed)
Spoke to uncle and he requested I contact his spouse which is the patient's aunt Victor [name]. Left vmail per DPR.

## 2016-10-08 LAB — STRATIFY JCV AB (W/ INDEX) W/ RFLX
Index Value: 0.19
JCV Antibody: NEGATIVE

## 2016-10-17 ENCOUNTER — Telehealth: Payer: Self-pay

## 2016-10-17 NOTE — Telephone Encounter (Signed)
Called patient's Aunt. No answer. Will try later.

## 2016-10-17 NOTE — Telephone Encounter (Signed)
-----   Message from Drema Dallas, DO sent at 10/17/2016  7:18 AM EDT ----- JCV antibody is negative.  I want to start Tysabri ASAP.  Repeat JCVirus antibody with index, CBC with diff and vitamin D level in 6 months (about a week prior to follow up).

## 2016-10-24 NOTE — Telephone Encounter (Signed)
Left message w/ LTA. Unable to reach aunt. Sent letter.

## 2016-11-03 ENCOUNTER — Telehealth: Payer: Self-pay | Admitting: Neurology

## 2016-11-03 NOTE — Telephone Encounter (Signed)
Spoke to [employer]. She just received letter mailed from our office dated 04/02. Advised that I spoke w/ her the same day letter was mailed out. Advised would call [employer] to check status of Tysabri.  Spoke w/ [employer] agent who stated had not recvd forms. Advised faxed forms on 04/06 & 04/10 recvd confirmations. Was advised to fax forms again. Confirmed fax# [phone]. Faxed forms again.  Called pt's Victor [name] and explained. Aunt verbalized understanding.

## 2016-11-03 NOTE — Telephone Encounter (Signed)
They would like to speak to some one about a letter they got about medication

## 2016-11-23 ENCOUNTER — Telehealth: Payer: Self-pay

## 2016-11-23 NOTE — Telephone Encounter (Signed)
Left message advising patient he needs to come to the office to sign enrollment forms for Tysabri to be processed.  Form in on clinical desk in the incomplete forms box.

## 2016-12-07 ENCOUNTER — Encounter: Payer: Self-pay | Admitting: Neurology

## 2016-12-20 ENCOUNTER — Telehealth: Payer: Self-pay | Admitting: Neurology

## 2016-12-20 NOTE — Telephone Encounter (Signed)
Patient irate over paperwork and some appointments not being made  Teddi knows about this and will contact the patients Aunt

## 2016-12-20 NOTE — Telephone Encounter (Signed)
Returning a call to the office. Please call. Thanks

## 2016-12-21 ENCOUNTER — Other Ambulatory Visit: Payer: Self-pay | Admitting: *Deleted

## 2016-12-21 ENCOUNTER — Other Ambulatory Visit: Payer: Self-pay

## 2016-12-21 DIAGNOSIS — G35 Multiple sclerosis: Secondary | ICD-10-CM

## 2016-12-21 MED ORDER — VITAMIN D (ERGOCALCIFEROL) 1.25 MG (50000 UNIT) PO CAPS: 50000.0000 [IU] | ORAL_CAPSULE | ORAL | 0 refills | Status: AC

## 2016-12-21 NOTE — Telephone Encounter (Signed)
Pt's aunt called and was given the appt. For Tysabri and also let her know [facility] would be calling to talk with her.  She gave me the new fax number for the [facility] to be faxed.  Told her there is a prescription for Vit D to be picked up.  Told her to call back and ask for me if she needed any other info.  She understood all the above.

## 2016-12-21 NOTE — Telephone Encounter (Signed)
Called to give the patient's Aunt info on Tysabri and the appointment time 01/10/17 at 9:00 at [facility]. Also to let her know that [facility] would like to talk with her.  And finally, get a number to fax the [facility]. Had to leave a voice mail message to call me back.

## 2016-12-21 NOTE — Telephone Encounter (Signed)
Call the patient's aunt and spoke to her about the paperwork that we are unable to fax as the number on the note for [place] to fax to is not working.  I had called the patient about a week ago to let him know that the paperwork is here and the fax number is not working to please call the office to give [place] direction.  The stated that she was calling now to check on this.  She is to get a working fax number for [place] to fax the [facility] [employer] form.  I have this on my desk. The aunt also asked about an appointment to [facility] for a drug.  I did not know what she was talking about.  I have talked with Dr. Everlena Cooper and he wants the patient started on Tysabri.  This will require the patient going to short stay at [facility] to get this injection.  I have called [facility] and they are awaiting orders to be put in, which Morrie Sheldon will enter into Epic.  I have called the [facility] Assistance program to make sure the enrollment form has been received.   They have received it and send [facility] short stay authorization to have infusion at that location. The lady I spoke with at [facility] said they have been trying to call the patient, as they would like to talk with him and they have been leaving voice mails with no one returning the calls.   Dr. Everlena Cooper would also like the patient started on D3 50,000 IU and I has asked Morrie Sheldon to enter that rx in Epic as well.

## 2017-01-10 ENCOUNTER — Ambulatory Visit (HOSPITAL_COMMUNITY)
Admission: RE | Admit: 2017-01-10 | Discharge: 2017-01-10 | Disposition: A | Discharge status: Home / Self Care | Payer: Medicaid Other | Source: Ambulatory Visit | Attending: Neurology | Admitting: Neurology

## 2017-01-10 ENCOUNTER — Encounter (HOSPITAL_COMMUNITY): Payer: Self-pay

## 2017-01-10 ENCOUNTER — Encounter (INDEPENDENT_AMBULATORY_CARE_PROVIDER_SITE_OTHER): Payer: Self-pay

## 2017-01-10 DIAGNOSIS — G35 Multiple sclerosis: Secondary | ICD-10-CM | POA: Diagnosis present

## 2017-01-10 MED ORDER — SODIUM CHLORIDE 0.9 % IV SOLN
Freq: Once | INTRAVENOUS | Status: AC
  Administered 2017-01-10: 10:00:00 via INTRAVENOUS

## 2017-01-10 MED ORDER — LORATADINE 10 MG PO TABS
10.0000 mg | ORAL_TABLET | Freq: Every day | ORAL | Status: DC
  Administered 2017-01-10: 10 mg via ORAL
  Filled 2017-01-10: qty 1

## 2017-01-10 MED ORDER — ACETAMINOPHEN 500 MG PO TABS
1000.0000 mg | ORAL_TABLET | Freq: Once | ORAL | Status: AC
  Administered 2017-01-10: 1000 mg via ORAL
  Filled 2017-01-10: qty 2

## 2017-01-10 MED ORDER — SODIUM CHLORIDE 0.9 % IV SOLN
300.0000 mg | INTRAVENOUS | Status: DC
  Administered 2017-01-10: 300 mg via INTRAVENOUS
  Filled 2017-01-10: qty 15

## 2017-01-10 NOTE — Discharge Instructions (Signed)

## 2017-01-10 NOTE — Progress Notes (Signed)
Pt's first infusion of Tysabri, pt. Stayed 1 hour observation after medication. Pt.'s aunt left to get food, patient pushing call bell the whole time his aunt was out of the room, patient kept requesting to ambulate in halls, patient does not walk without walker. Rn instructed patient multiple times that this is an outpatient floor and he would not be ambulating in the halls, that he was only here for his medication. Patient has short-term memory issues, teaching re-enforced multiple times, patient is very un-steady when standing. Instructions for next appointments viewed with patients aunt.

## 2017-02-01 ENCOUNTER — Telehealth: Payer: Self-pay | Admitting: Neurology

## 2017-02-01 ENCOUNTER — Other Ambulatory Visit: Payer: Self-pay | Admitting: *Deleted

## 2017-02-01 DIAGNOSIS — G35 Multiple sclerosis: Secondary | ICD-10-CM

## 2017-02-01 NOTE — Telephone Encounter (Signed)
Order placed

## 2017-02-01 NOTE — Telephone Encounter (Signed)
Short stay needs orders for Victor [name]'s Tysabri on 02/07/17.

## 2017-02-07 ENCOUNTER — Ambulatory Visit (HOSPITAL_COMMUNITY)
Admission: RE | Admit: 2017-02-07 | Discharge: 2017-02-07 | Disposition: A | Discharge status: Home / Self Care | Payer: Medicaid Other | Source: Ambulatory Visit | Attending: Neurology | Admitting: Neurology

## 2017-02-07 DIAGNOSIS — G35 Multiple sclerosis: Secondary | ICD-10-CM | POA: Diagnosis present

## 2017-02-07 MED ORDER — SODIUM CHLORIDE 0.9 % IV SOLN
INTRAVENOUS | Status: AC
  Administered 2017-02-07: 11:00:00 via INTRAVENOUS

## 2017-02-07 MED ORDER — SODIUM CHLORIDE 0.9 % IV SOLN
300.0000 mg | INTRAVENOUS | Status: DC
  Administered 2017-02-07: 300 mg via INTRAVENOUS
  Filled 2017-02-07: qty 15

## 2017-03-07 ENCOUNTER — Ambulatory Visit (HOSPITAL_COMMUNITY)
Admission: RE | Admit: 2017-03-07 | Discharge: 2017-03-07 | Disposition: A | Discharge status: Home / Self Care | Payer: Medicaid Other | Source: Ambulatory Visit | Attending: Neurology | Admitting: Neurology

## 2017-03-07 ENCOUNTER — Encounter (HOSPITAL_COMMUNITY): Payer: Self-pay

## 2017-03-07 DIAGNOSIS — G35 Multiple sclerosis: Secondary | ICD-10-CM | POA: Diagnosis not present

## 2017-03-07 MED ORDER — SODIUM CHLORIDE 0.9 % IV SOLN
300.0000 mg | INTRAVENOUS | Status: DC
  Administered 2017-03-07: 300 mg via INTRAVENOUS
  Filled 2017-03-07: qty 15

## 2017-03-07 MED ORDER — SODIUM CHLORIDE 0.9 % IV SOLN
INTRAVENOUS | Status: AC
  Administered 2017-03-07: 09:00:00 via INTRAVENOUS

## 2017-03-07 NOTE — Progress Notes (Signed)
Uneventful Tysabri infusion.  Pt stayed for the 1 hour observation post infusion.  Aunt in room with pt most of his time here, She did leave for about 30 minutes and pt kept asking to get up and walk around, but when the aunt returned pt was quiet and calm.

## 2017-04-04 ENCOUNTER — Ambulatory Visit (HOSPITAL_COMMUNITY)
Admission: RE | Admit: 2017-04-04 | Discharge: 2017-04-04 | Disposition: A | Discharge status: Home / Self Care | Payer: Medicaid Other | Source: Ambulatory Visit | Attending: Neurology | Admitting: Neurology

## 2017-04-04 ENCOUNTER — Encounter (HOSPITAL_COMMUNITY): Payer: Self-pay

## 2017-04-04 DIAGNOSIS — G35 Multiple sclerosis: Secondary | ICD-10-CM | POA: Insufficient documentation

## 2017-04-04 MED ORDER — SODIUM CHLORIDE 0.9 % IV SOLN
300.0000 mg | INTRAVENOUS | Status: DC
  Administered 2017-04-04: 300 mg via INTRAVENOUS
  Filled 2017-04-04: qty 15

## 2017-04-04 MED ORDER — SODIUM CHLORIDE 0.9 % IV SOLN
INTRAVENOUS | Status: AC
  Administered 2017-04-04: 09:00:00 via INTRAVENOUS

## 2017-04-11 ENCOUNTER — Ambulatory Visit (INDEPENDENT_AMBULATORY_CARE_PROVIDER_SITE_OTHER): Payer: Medicaid Other | Admitting: Neurology

## 2017-04-11 ENCOUNTER — Encounter: Payer: Self-pay | Admitting: Neurology

## 2017-04-11 VITALS — BP 130/82 | HR 72 | Ht 68.0 in | Wt 229.7 lb

## 2017-04-11 DIAGNOSIS — R4189 Other symptoms and signs involving cognitive functions and awareness: Secondary | ICD-10-CM

## 2017-04-11 DIAGNOSIS — G40909 Epilepsy, unspecified, not intractable, without status epilepticus: Secondary | ICD-10-CM

## 2017-04-11 DIAGNOSIS — S069X4D Unspecified intracranial injury with loss of consciousness of 6 hours to 24 hours, subsequent encounter: Secondary | ICD-10-CM

## 2017-04-11 DIAGNOSIS — G35 Multiple sclerosis: Secondary | ICD-10-CM

## 2017-04-11 DIAGNOSIS — F259 Schizoaffective disorder, unspecified: Secondary | ICD-10-CM
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
## 2017-04-11 NOTE — Patient Instructions (Signed)
1.  Continue Tysabri every month as scheduled.  Continue Depakote ER  twice daily and Keppra  twice daily 2.  Repeat JC virus antibody and index in December 3.  Repeat MRI of brain and cervical spine with and without contrast, CBC with diff and hepatic panel in 6 months. 4.  Follow up with me in 6 months after repeat testing.

## 2017-05-02 ENCOUNTER — Encounter (HOSPITAL_COMMUNITY): Payer: Self-pay

## 2017-05-02 ENCOUNTER — Ambulatory Visit (HOSPITAL_COMMUNITY)
Admission: RE | Admit: 2017-05-02 | Discharge: 2017-05-02 | Disposition: A | Discharge status: Home / Self Care | Payer: Medicaid Other | Source: Ambulatory Visit | Attending: Neurology | Admitting: Neurology

## 2017-05-02 DIAGNOSIS — G35 Multiple sclerosis: Secondary | ICD-10-CM

## 2017-05-02 MED ORDER — SODIUM CHLORIDE 0.9 % IV SOLN
300.0000 mg | INTRAVENOUS | Status: AC
  Administered 2017-05-02: 300 mg via INTRAVENOUS
  Filled 2017-05-02: qty 15

## 2017-05-02 MED ORDER — SODIUM CHLORIDE 0.9 % IV SOLN
INTRAVENOUS | Status: AC
  Administered 2017-05-02: 09:00:00 via INTRAVENOUS

## 2017-05-02 NOTE — Progress Notes (Signed)
Aunt accompanies Pt.  Tolerated infusion well.  Stayed 1 hour post infusion observation time. Assist Pt pivoting back to wheelchair for discharge with aunt.

## 2017-05-29 ENCOUNTER — Other Ambulatory Visit: Payer: Self-pay

## 2017-05-30 ENCOUNTER — Ambulatory Visit (HOSPITAL_COMMUNITY): Payer: Medicaid Other

## 2017-05-31 ENCOUNTER — Other Ambulatory Visit: Payer: Self-pay

## 2017-05-31 MED ORDER — EPINEPHRINE 0.3 MG/0.3ML IJ SOAJ: 0.3000 mg | Freq: Once | INTRAMUSCULAR | Status: AC

## 2017-05-31 MED ORDER — ACETAMINOPHEN 500 MG PO TABS: 1000.0000 mg | ORAL_TABLET | Freq: Once | ORAL | Status: AC

## 2017-05-31 MED ORDER — LORATADINE 10 MG PO TABS: 10.0000 mg | ORAL_TABLET | Freq: Every day | ORAL | Status: AC

## 2017-05-31 MED ORDER — SODIUM CHLORIDE 0.9 % IV SOLN: 1000.0000 mg | Freq: Once | INTRAVENOUS | Status: AC

## 2017-05-31 MED ORDER — HEPARIN SOD (PORK) LOCK FLUSH 100 UNIT/ML IV SOLN: 500.0000 [IU] | Freq: Once | INTRAVENOUS | Status: AC

## 2017-06-05 ENCOUNTER — Ambulatory Visit (HOSPITAL_COMMUNITY)
Admission: RE | Admit: 2017-06-05 | Discharge: 2017-06-05 | Disposition: A | Discharge status: Home / Self Care | Payer: Medicaid Other | Source: Ambulatory Visit | Attending: Neurology | Admitting: Neurology

## 2017-06-05 ENCOUNTER — Encounter (HOSPITAL_COMMUNITY): Payer: Self-pay

## 2017-06-05 ENCOUNTER — Other Ambulatory Visit: Payer: Self-pay

## 2017-06-05 DIAGNOSIS — G35 Multiple sclerosis: Secondary | ICD-10-CM | POA: Insufficient documentation

## 2017-06-05 MED ORDER — LORATADINE 10 MG PO TABS: 10.0000 mg | ORAL_TABLET | Freq: Every day | ORAL | Status: DC

## 2017-06-05 MED ORDER — SODIUM CHLORIDE 0.9 % IV SOLN
INTRAVENOUS | Status: DC
  Administered 2017-06-05: 08:00:00 via INTRAVENOUS

## 2017-06-05 MED ORDER — NATALIZUMAB 300 MG/15ML IV CONC
300.0000 mg | INTRAVENOUS | Status: DC
  Administered 2017-06-05: 300 mg via INTRAVENOUS
  Filled 2017-06-05: qty 15

## 2017-06-05 MED ORDER — ACETAMINOPHEN 500 MG PO TABS: 1000.0000 mg | ORAL_TABLET | Freq: Once | ORAL | Status: DC

## 2017-06-05 NOTE — Progress Notes (Signed)
Dr Moises BloodJaffe's office called after infusion started, states Pt needs JC virus labs. Informed Pt's aunt of need to contact office and make appointment to have lab work done.

## 2017-06-27 ENCOUNTER — Telehealth: Payer: Self-pay

## 2017-06-27 ENCOUNTER — Ambulatory Visit (HOSPITAL_COMMUNITY): Payer: Medicaid Other

## 2017-06-27 NOTE — Telephone Encounter (Signed)
Rcvd call from [place] at [facility]. Needs oeders for Tysabri. I called back later and spoke with Darel Hong, since Pt has not had the JC virus lab drawn, am not putting in orders yet. Darel Hong will adv [employer]. Pts appt for infusion is Mon 07/03/17. Called Pt, LM on VM for rtrn call. Pt must have lab prior to infusion.

## 2017-06-30 ENCOUNTER — Telehealth: Payer: Self-pay

## 2017-06-30 NOTE — Telephone Encounter (Signed)
[facility] called about Pt Tysabri orders-advsd her Pt has not had JC virus labs, will need that prior

## 2017-07-03 ENCOUNTER — Ambulatory Visit (HOSPITAL_COMMUNITY)
Admission: RE | Admit: 2017-07-03 | Discharge: 2017-07-03 | Disposition: A | Discharge status: Home / Self Care | Payer: Medicaid Other | Source: Ambulatory Visit | Attending: Neurology | Admitting: Neurology

## 2017-07-06 ENCOUNTER — Other Ambulatory Visit: Payer: Medicaid Other

## 2017-07-06 DIAGNOSIS — G35 Multiple sclerosis: Secondary | ICD-10-CM

## 2017-07-10 ENCOUNTER — Ambulatory Visit: Payer: Self-pay | Admitting: Neurology

## 2017-07-10 NOTE — Progress Notes (Signed)
Patient and aunt in admitting office here at [facility] thinking they had an appt in Short Stay for Tysabri appt.today at 1530.  Patient and aunt had been informed he needed to have labs drawn at the office prior to them sending over orders and allowing us to schedule next appt. When I was explaining over the phone to the aunt that he did NOT have an appointment scheduled with us today as we had not received orders/lab results from office she became upset and hung up the phone.   I called the office and informed them of above. Sandy 9with Dr. Everlena CooperJaffe) stated that he had just had labwork drawn on 12/13 and the results were not back yet. The patient's appointment today was with the MD office today at 1530 NOT at [facility]. Andrey CampanileSandy stated that she will put in orders for next Tysabri appt as JC virus labs should be back in the next few days. Andrey CampanileSandy will call patient's aunt today and inform her of patient's [facility] app[facility] on Jan 2 at 1200.   We will check labs prior to infusion on January 2. Andrey CampanileSandy stated if labs come back positive they will inform us prior to appt time.

## 2017-07-13 ENCOUNTER — Other Ambulatory Visit: Payer: Self-pay

## 2017-07-13 LAB — JC VIRUS DNA,PCR (WHOLE BLOOD): JC VIRUS DNA: NEGATIVE

## 2017-07-14 ENCOUNTER — Telehealth: Payer: Self-pay | Admitting: Neurology

## 2017-07-14 NOTE — Telephone Encounter (Signed)
Debbie called and is needing either a fax or call regarding this patient. She was checking to see if labs were done and regarding his medication. Thanks

## 2017-07-14 NOTE — Telephone Encounter (Signed)
Called and spoke with Hayley, advsd her I rcvd the form, am faxing back now

## 2017-07-20 ENCOUNTER — Other Ambulatory Visit: Payer: Self-pay

## 2017-07-20 ENCOUNTER — Telehealth: Payer: Self-pay

## 2017-07-20 DIAGNOSIS — G35 Multiple sclerosis: Secondary | ICD-10-CM

## 2017-07-20 NOTE — Telephone Encounter (Signed)
Called and spoke with Pts mother, advsd her Pt has an appointment 01/18/18 for labs. She said she did not have a 2019 calendar and would wait for reminder call.

## 2017-07-26 ENCOUNTER — Encounter (HOSPITAL_COMMUNITY): Payer: Self-pay

## 2017-07-26 ENCOUNTER — Ambulatory Visit (HOSPITAL_COMMUNITY)
Admission: RE | Admit: 2017-07-26 | Discharge: 2017-07-26 | Disposition: A | Discharge status: Home / Self Care | Payer: Medicaid Other | Source: Ambulatory Visit | Attending: Neurology | Admitting: Neurology

## 2017-07-26 DIAGNOSIS — G35 Multiple sclerosis: Secondary | ICD-10-CM | POA: Diagnosis not present

## 2017-07-26 MED ORDER — SODIUM CHLORIDE 0.9 % IV SOLN: 300.0000 mg | Freq: Once | INTRAVENOUS | Status: DC

## 2017-07-26 MED ORDER — EPINEPHRINE 0.3 MG/0.3ML IJ SOAJ: 0.3000 mg | Freq: Once | INTRAMUSCULAR | Status: DC

## 2017-07-26 MED ORDER — METHYLPREDNISOLONE SODIUM SUCC 1000 MG IJ SOLR: 500.0000 mg | Freq: Once | INTRAMUSCULAR | Status: DC

## 2017-07-26 MED ORDER — SODIUM CHLORIDE 0.9 % IV SOLN
300.0000 mg | INTRAVENOUS | Status: DC
  Administered 2017-07-26: 300 mg via INTRAVENOUS
  Filled 2017-07-26: qty 15

## 2017-07-26 MED ORDER — ACETAMINOPHEN 500 MG PO TABS: 1000.0000 mg | ORAL_TABLET | Freq: Once | ORAL | Status: DC

## 2017-07-26 MED ORDER — LORATADINE 10 MG PO TABS: 10.0000 mg | ORAL_TABLET | Freq: Every day | ORAL | Status: DC

## 2017-07-26 MED ORDER — HEPARIN SOD (PORK) LOCK FLUSH 100 UNIT/ML IV SOLN: 500.0000 [IU] | Freq: Once | INTRAVENOUS | Status: DC

## 2017-07-26 MED ORDER — SODIUM CHLORIDE 0.9 % IV SOLN
Freq: Once | INTRAVENOUS | Status: AC
  Administered 2017-07-26: 13:00:00 via INTRAVENOUS

## 2017-07-26 NOTE — Discharge Instructions (Signed)
Natalizumab injection / Tysabri °What is this medicine? °NATALIZUMAB (na ta LIZ you mab) is used to treat relapsing multiple sclerosis. This drug is not a cure. It is also used to treat Crohn's disease. °This medicine may be used for other purposes; ask your health care provider or pharmacist if you have questions. °COMMON BRAND NAME(S): Tysabri °What should I tell my health care provider before I take this medicine? °They need to know if you have any of these conditions: °-immune system problems °-progressive multifocal leukoencephalopathy (PML) °-an unusual or allergic reaction to natalizumab, other medicines, foods, dyes, or preservatives °-pregnant or trying to get pregnant °-breast-feeding °How should I use this medicine? °This medicine is for infusion into a vein. It is given by a health care professional in a hospital or clinic setting. °A special MedGuide will be given to you by the pharmacist with each prescription and refill. Be sure to read this information carefully each time. °Talk to your pediatrician regarding the use of this medicine in children. This medicine is not approved for use in children. °Overdosage: If you think you have taken too much of this medicine contact a poison control center or emergency room at once. °NOTE: This medicine is only for you. Do not share this medicine with others. °What if I miss a dose? °It is important not to miss your dose. Call your doctor or health care professional if you are unable to keep an appointment. °What may interact with this medicine? °-azathioprine °-cyclosporine °-interferon °-6-mercaptopurine °-methotrexate °-steroid medicines like prednisone or cortisone °-TNF-alpha inhibitors like adalimumab, etanercept, and infliximab °-vaccines °This list may not describe all possible interactions. Give your health care provider a list of all the medicines, herbs, non-prescription drugs, or dietary supplements you use. Also tell them if you smoke, drink alcohol,  or use illegal drugs. Some items may interact with your medicine. °What should I watch for while using this medicine? °Your condition will be monitored carefully while you are receiving this medicine. Visit your doctor for regular check ups. Tell your doctor or healthcare professional if your symptoms do not start to get better or if they get worse. °Stay away from people who are sick. Call your doctor or health care professional for advice if you get a fever, chills or sore throat, or other symptoms of a cold or flu. Do not treat yourself. °In some patients, this medicine may cause a serious brain infection that may cause death. If you have any problems seeing, thinking, speaking, walking, or standing, tell your doctor right away. If you cannot reach your doctor, get urgent medical care. °What side effects may I notice from receiving this medicine? °Side effects that you should report to your doctor or health care professional as soon as possible: °-allergic reactions like skin rash, itching or hives, swelling of the face, lips, or tongue °-breathing problems °-changes in vision °-chest pain °-dark urine °-depression, feelings of sadness °-dizziness °-general ill feeling or flu-like symptoms °-irregular, missed, or painful menstrual periods °-light-colored stools °-loss of appetite, nausea °-muscle weakness °-problems with balance, talking, or walking °-right upper belly pain °-unusually weak or tired °-yellowing of the eyes or skin °Side effects that usually do not require medical attention (report to your doctor or health care professional if they continue or are bothersome): °-aches, pains °-headache °-stomach upset °-tiredness °This list may not describe all possible side effects. Call your doctor for medical advice about side effects. You may report side effects to FDA at 1-800-FDA-1088. °Where should   I keep my medicine? °This drug is given in a hospital or clinic and will not be stored at home. °NOTE: This  sheet is a summary. It may not cover all possible information. If you have questions about this medicine, talk to your doctor, pharmacist, or health care provider. °© 2018 Elsevier/Gold Standard (2008-08-30 13:33:21) ° °

## 2017-07-26 NOTE — Progress Notes (Signed)
Question premed orders and others from Dr Moises Blood office, however unable to contact MD at office.  Consulted pharmacy on order release and agreed to release Tysabri and NS infusion only.  Pt tolerated infusion without problems.  Sent Dr Everlena Cooper message on specific concerns.  Need clarificatin in future regarding premeds, port flush (Pt does not have port) orders.

## 2017-07-27 ENCOUNTER — Telehealth: Payer: Self-pay

## 2017-07-27 NOTE — Telephone Encounter (Signed)
-----   Message from Drema Dallas, DO sent at 07/27/2017  7:20 AM EST ----- Regarding: FW: Orders and inability to contact MD at infusion time Contact: [phone]   ----- Message ----- From: Alita Chyle, RN Sent: 07/26/2017   4:20 PM To: Drema Dallas, DO, Ricke Hey, RN, # Subject: Orders and inability to contact MD at infusi#       Hi Dr Everlena Cooper,  I am concerned about the orders I have been receiving on Mr Leh.  I attempted to contact you at your office today (07/26/17) to clarify orders but was unable to.  The concern was: 1). Premeds were ordered which Pt did not originally receive with infusions and tolerated well.  Pt had skipped a month with infusion so I did not know if this was intentional - tylenol, claritin and solu-medrol 500mg  (very large dose ordered as premed, not PRN reaction).  2) heparin flush - Pt does not have a port. 3) Tysabri was entered twice for same day.       I consulted the pharmacist and we agreed on the following:  She released only the Tysabri infusion, which I infused.  The Pt tolerated well.  He did not receive any premeds.       Several months ago while Pt was receiving infusion, I received a call from your office stating that his infusion appointment should be cancelled for the day because he had not had JC virus lab work. Informed office staff that it was too late, infusion going.  Informed Pt that he needed to make appt with your office prior to next infusion which he did. And that brings [place] to today's infusion.       Is there an alternate contact number if we have concerns? Thank you. Burn[facility] short stay RN.

## 2017-07-27 NOTE — Telephone Encounter (Signed)
Spoke with Talmage Coin, RN. She states she called several times yesterday and was unable to get through. We discussed the orders and determined the order we are able to put in are not able to be changed by [place] as they are by the RN staff at short stay. Kim walked me through the Tysabri infusion process and what should be entered. She offered to actually come by our office and see what our screen looks like while entering so it can be streamlined on our end. She was very helpful! I gave her my direct number in the event we have future issues.

## 2017-08-18 ENCOUNTER — Other Ambulatory Visit: Payer: Self-pay | Admitting: *Deleted

## 2017-08-18 ENCOUNTER — Other Ambulatory Visit: Payer: Self-pay

## 2017-08-23 ENCOUNTER — Ambulatory Visit (HOSPITAL_COMMUNITY): Payer: Medicaid Other

## 2017-08-25 ENCOUNTER — Encounter (HOSPITAL_COMMUNITY): Payer: Self-pay

## 2017-08-25 ENCOUNTER — Ambulatory Visit (HOSPITAL_COMMUNITY)
Admission: RE | Admit: 2017-08-25 | Discharge: 2017-08-25 | Disposition: A | Discharge status: Home / Self Care | Payer: Medicaid Other | Source: Ambulatory Visit | Attending: Neurology | Admitting: Neurology

## 2017-08-25 DIAGNOSIS — G35 Multiple sclerosis: Secondary | ICD-10-CM | POA: Insufficient documentation

## 2017-08-25 MED ORDER — SODIUM CHLORIDE 0.9 % IV SOLN
300.0000 mg | INTRAVENOUS | Status: DC
  Administered 2017-08-25: 300 mg via INTRAVENOUS
  Filled 2017-08-25: qty 15

## 2017-08-25 MED ORDER — ACETAMINOPHEN 500 MG PO TABS
1000.0000 mg | ORAL_TABLET | Freq: Once | ORAL | Status: AC
  Administered 2017-08-25: 1000 mg via ORAL
  Filled 2017-08-25: qty 2

## 2017-08-25 MED ORDER — SODIUM CHLORIDE 0.9 % IV SOLN
Freq: Once | INTRAVENOUS | Status: AC
  Administered 2017-08-25: 10:00:00 via INTRAVENOUS

## 2017-08-25 MED ORDER — EPINEPHRINE 0.3 MG/0.3ML IJ SOAJ
0.3000 mg | INTRAMUSCULAR | Status: DC | PRN
  Filled 2017-08-25: qty 0.3

## 2017-08-25 MED ORDER — SODIUM CHLORIDE 0.9 % IV SOLN
500.0000 mg | INTRAVENOUS | Status: DC | PRN
  Filled 2017-08-25: qty 4

## 2017-08-25 MED ORDER — LORATADINE 10 MG PO TABS
10.0000 mg | ORAL_TABLET | Freq: Once | ORAL | Status: AC
  Administered 2017-08-25: 10 mg via ORAL
  Filled 2017-08-25: qty 1

## 2017-08-25 MED ORDER — NATALIZUMAB 300 MG/15ML IV CONC
300.0000 mg | INTRAVENOUS | Status: DC
  Filled 2017-08-25 (×2): qty 15

## 2017-08-25 NOTE — Progress Notes (Signed)
Pt has been tolerating infusion without premeds.  However, gave pre meds this visit as ordered, claritin and tylenol.  Tolerated infusion without event. Ambulated out with walker and aunt at side.

## 2017-08-25 NOTE — Discharge Instructions (Signed)
Natalizumab injection/Tysabri Infusion  °What is this medicine? °NATALIZUMAB (na ta LIZ you mab) is used to treat relapsing multiple sclerosis. This drug is not a cure. It is also used to treat Crohn's disease. °This medicine may be used for other purposes; ask your health care provider or pharmacist if you have questions. °COMMON BRAND NAME(S): Tysabri °What should I tell my health care provider before I take this medicine? °They need to know if you have any of these conditions: °-immune system problems °-progressive multifocal leukoencephalopathy (PML) °-an unusual or allergic reaction to natalizumab, other medicines, foods, dyes, or preservatives °-pregnant or trying to get pregnant °-breast-feeding °How should I use this medicine? °This medicine is for infusion into a vein. It is given by a health care professional in a hospital or clinic setting. °A special MedGuide will be given to you by the pharmacist with each prescription and refill. Be sure to read this information carefully each time. °Talk to your pediatrician regarding the use of this medicine in children. This medicine is not approved for use in children. °Overdosage: If you think you have taken too much of this medicine contact a poison control center or emergency room at once. °NOTE: This medicine is only for you. Do not share this medicine with others. °What if I miss a dose? °It is important not to miss your dose. Call your doctor or health care professional if you are unable to keep an appointment. °What may interact with this medicine? °-azathioprine °-cyclosporine °-interferon °-6-mercaptopurine °-methotrexate °-steroid medicines like prednisone or cortisone °-TNF-alpha inhibitors like adalimumab, etanercept, and infliximab °-vaccines °This list may not describe all possible interactions. Give your health care provider a list of all the medicines, herbs, non-prescription drugs, or dietary supplements you use. Also tell them if you smoke, drink  alcohol, or use illegal drugs. Some items may interact with your medicine. °What should I watch for while using this medicine? °Your condition will be monitored carefully while you are receiving this medicine. Visit your doctor for regular check ups. Tell your doctor or healthcare professional if your symptoms do not start to get better or if they get worse. °Stay away from people who are sick. Call your doctor or health care professional for advice if you get a fever, chills or sore throat, or other symptoms of a cold or flu. Do not treat yourself. °In some patients, this medicine may cause a serious brain infection that may cause death. If you have any problems seeing, thinking, speaking, walking, or standing, tell your doctor right away. If you cannot reach your doctor, get urgent medical care. °What side effects may I notice from receiving this medicine? °Side effects that you should report to your doctor or health care professional as soon as possible: °-allergic reactions like skin rash, itching or hives, swelling of the face, lips, or tongue °-breathing problems °-changes in vision °-chest pain °-dark urine °-depression, feelings of sadness °-dizziness °-general ill feeling or flu-like symptoms °-irregular, missed, or painful menstrual periods °-light-colored stools °-loss of appetite, nausea °-muscle weakness °-problems with balance, talking, or walking °-right upper belly pain °-unusually weak or tired °-yellowing of the eyes or skin °Side effects that usually do not require medical attention (report to your doctor or health care professional if they continue or are bothersome): °-aches, pains °-headache °-stomach upset °-tiredness °This list may not describe all possible side effects. Call your doctor for medical advice about side effects. You may report side effects to FDA at 1-800-FDA-1088. °Where should   I keep my medicine? °This drug is given in a hospital or clinic and will not be stored at  home. °NOTE: This sheet is a summary. It may not cover all possible information. If you have questions about this medicine, talk to your doctor, pharmacist, or health care provider. °© 2018 Elsevier/Gold Standard (2008-08-30 13:33:21) ° °

## 2017-09-15 ENCOUNTER — Other Ambulatory Visit: Payer: Self-pay | Admitting: *Deleted

## 2017-09-15 DIAGNOSIS — G35 Multiple sclerosis: Secondary | ICD-10-CM

## 2017-09-22 ENCOUNTER — Ambulatory Visit (HOSPITAL_COMMUNITY)
Admission: RE | Admit: 2017-09-22 | Discharge: 2017-09-22 | Disposition: A | Discharge status: Home / Self Care | Payer: Medicaid Other | Source: Ambulatory Visit | Attending: Neurology | Admitting: Neurology

## 2017-09-22 ENCOUNTER — Encounter (HOSPITAL_COMMUNITY): Payer: Self-pay

## 2017-09-22 DIAGNOSIS — G35 Multiple sclerosis: Secondary | ICD-10-CM | POA: Diagnosis present

## 2017-09-22 MED ORDER — ACETAMINOPHEN 500 MG PO TABS: 1000.0000 mg | ORAL_TABLET | Freq: Once | ORAL | Status: DC

## 2017-09-22 MED ORDER — SODIUM CHLORIDE 0.9 % IV SOLN
Freq: Once | INTRAVENOUS | Status: AC
  Administered 2017-09-22: 10:00:00 via INTRAVENOUS

## 2017-09-22 MED ORDER — DIPHENHYDRAMINE HCL 25 MG PO CAPS: 50.0000 mg | ORAL_CAPSULE | Freq: Once | ORAL | Status: DC

## 2017-09-22 MED ORDER — SODIUM CHLORIDE 0.9 % IV SOLN
300.0000 mg | INTRAVENOUS | Status: DC
  Administered 2017-09-22: 300 mg via INTRAVENOUS
  Filled 2017-09-22: qty 15

## 2017-09-22 NOTE — Progress Notes (Signed)
Patient and his aunt refuse benadryl and tylenol preinfusion, stating he has never taken and has no problems with infusion.

## 2017-09-27 IMAGING — CT CT HEAD W/O CM
3 of 4 series · 14 of 47 positions shown, 16 images · non-contrast
Comparison: None.

CLINICAL DATA: History traumatic brain injury. Increased weakness
and falls.

EXAM:
CT HEAD WITHOUT CONTRAST
TECHNIQUE: Contiguous axial images were obtained from the base of the skull
through the vertex without intravenous contrast.

[Series 2: head w/o · axial · non-contrast · 0.45mm/px · z∈[-146,-26]mm · 8 of 30 slices shown, 10 images]
[im 3/30  brain]
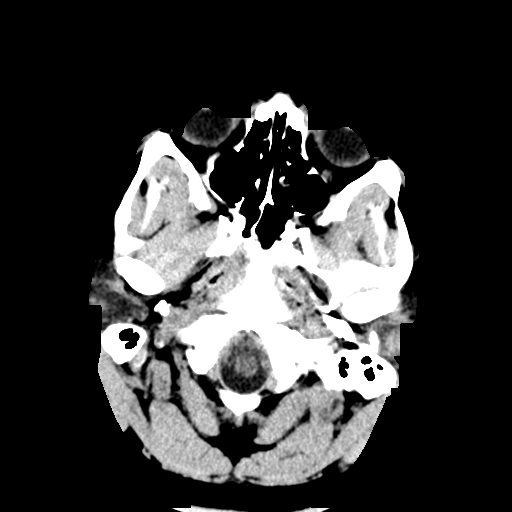
[im 3/30  bone]
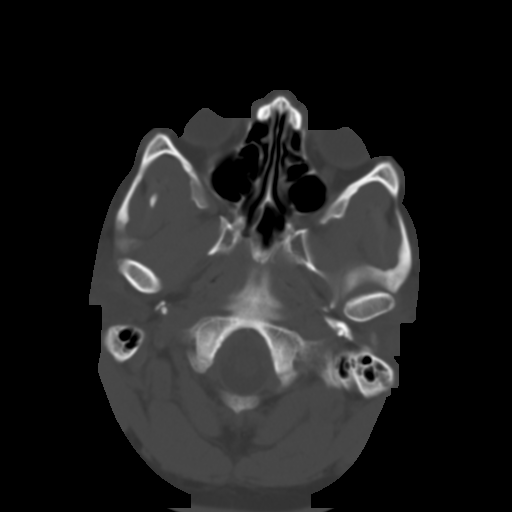
[im 7/30  brain]
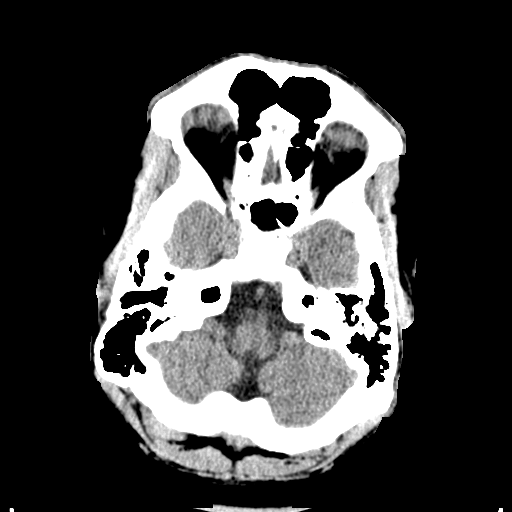
[im 11/30  brain]
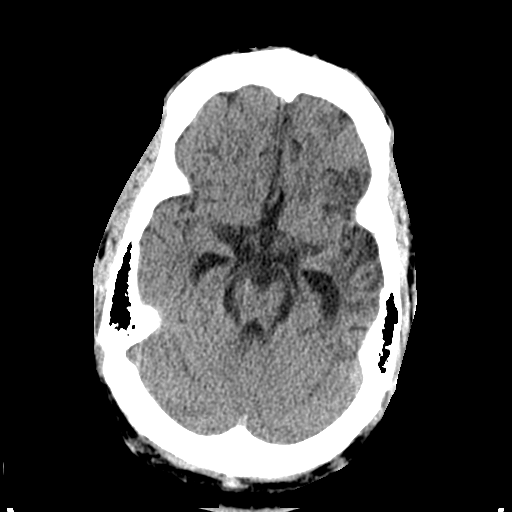
[im 13/30  brain]
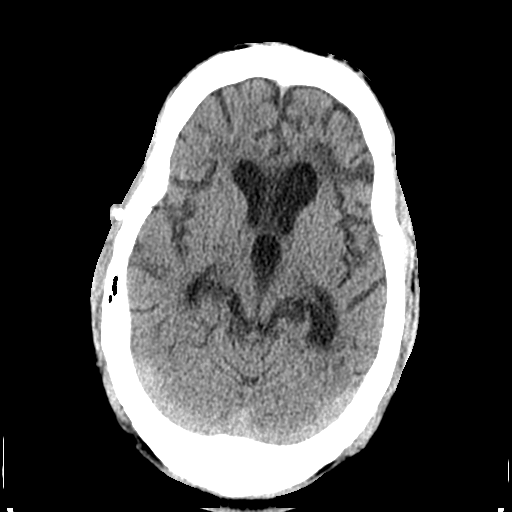
[im 17/30  brain]
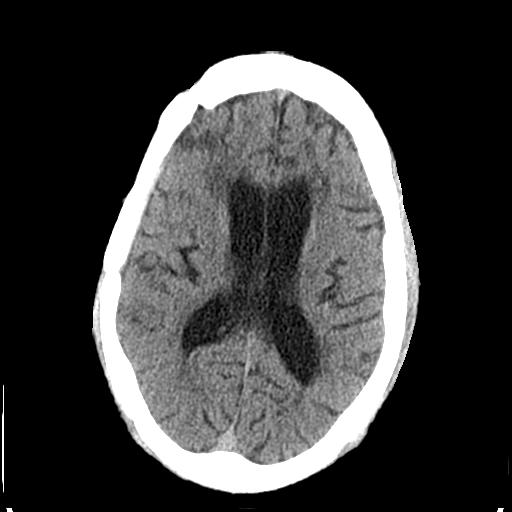
[im 17/30  bone]
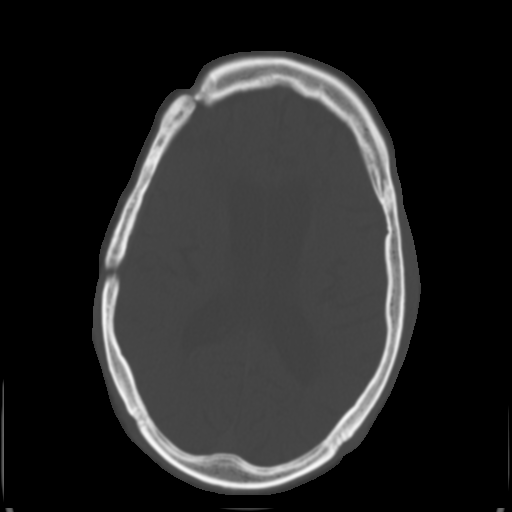
[im 19/30  brain]
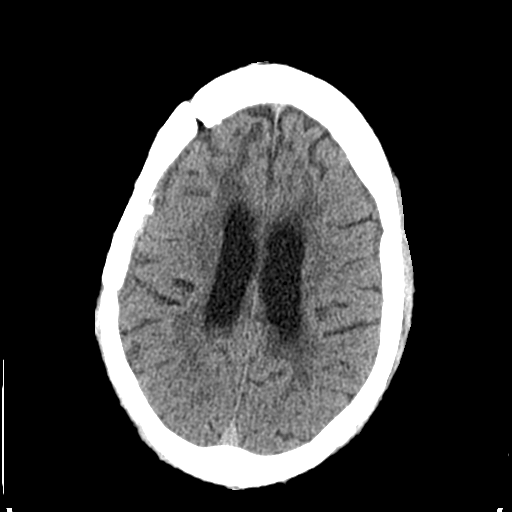
[im 23/30  brain]
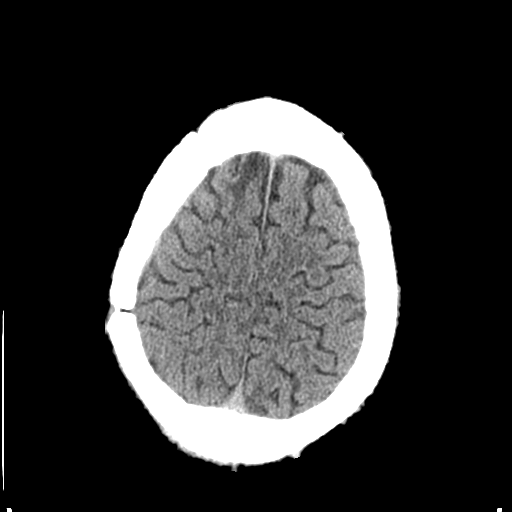
[im 27/30  brain]
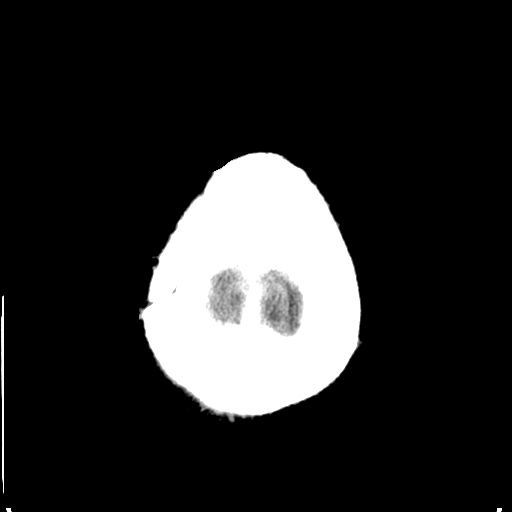

[Series 5: coronal · coronal · 0.28mm/px · 3 of 76 slices shown]
[im 26/76  brain]
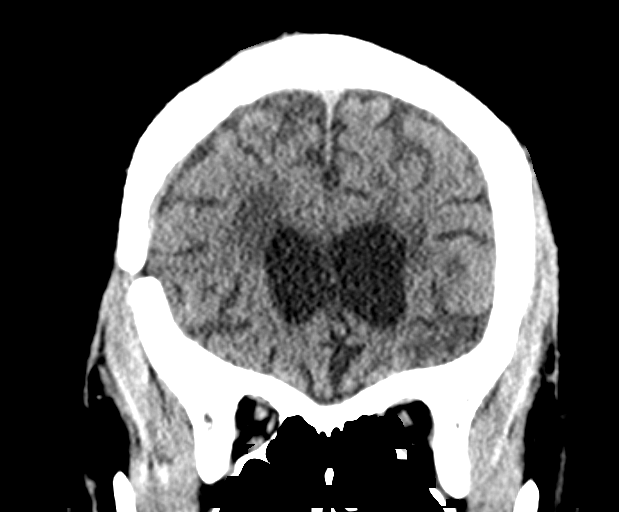
[im 34/76  brain]
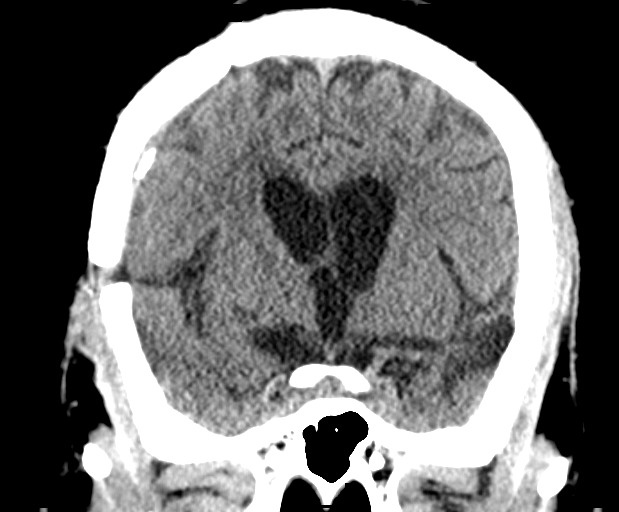
[im 42/76  brain]
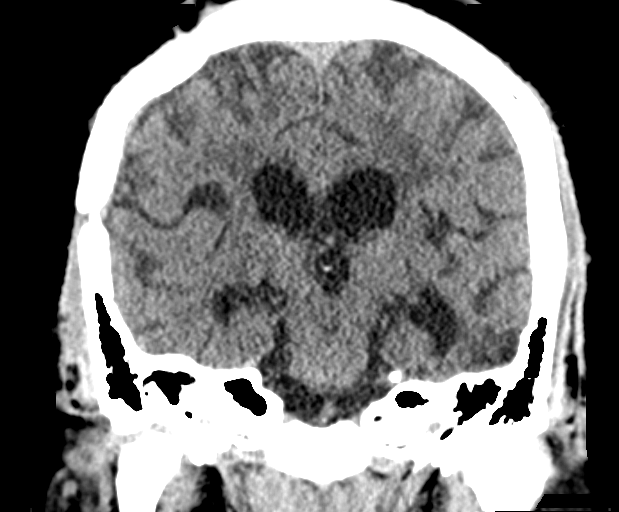

[Series 6: sagittal · sagittal · 0.28mm/px · 3 of 58 slices shown]
[im 20/58  brain]
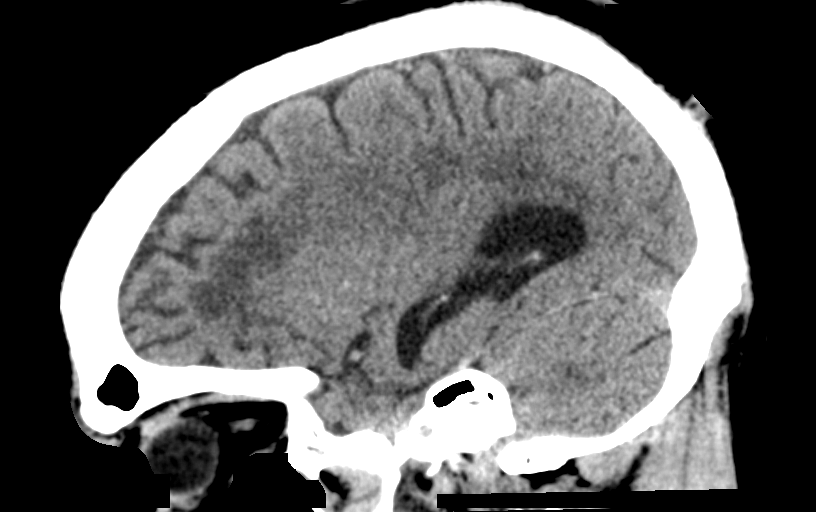
[im 29/58  brain]
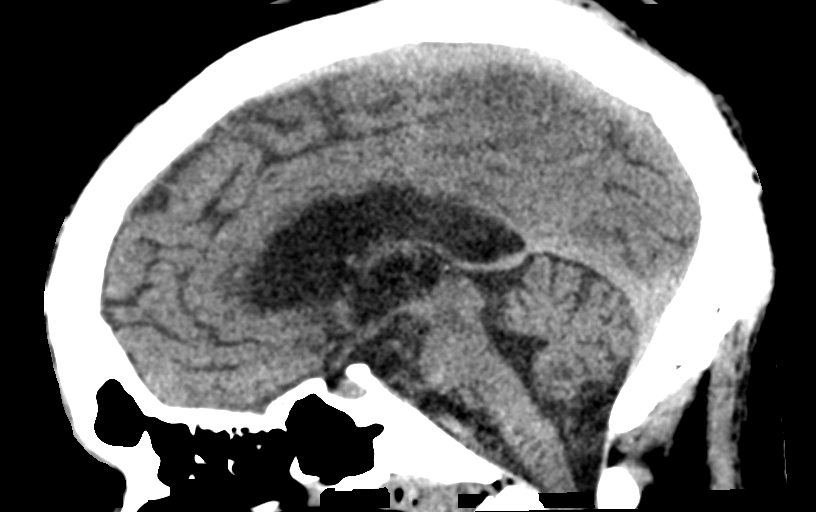
[im 39/58  brain]
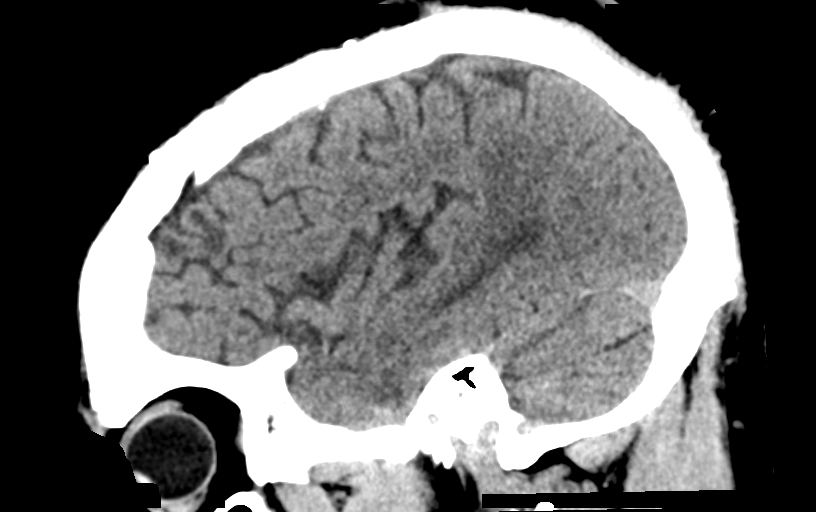

[14 of 47 positions shown; findings below may reference images not displayed]

FINDINGS: Brain: Left temporal and frontal encephalomalacia is noted
consistent with old infarction. No mass effect or midline shift is
noted. Ventricular size is within normal limits. Calcifications is
seen overlying the right frontal lobe under craniotomy site most
likely represent postoperative changes as well. No definite
hemorrhage is noted. Right frontal low density is noted concerning
for infarction of indeterminate age.

Vascular: No abnormality seen.

Skull: Status post right frontal craniotomy. Otherwise bony
calvarium appears intact.

Sinuses/Orbits: Visualized sinuses appear normal.

Other: None.
IMPRESSION: Status post right frontal craniotomy. Left temporal and frontal
encephalomalacia most consistent with old infarction.

Right frontal low density is noted most consistent with old
infarction, although acute infarction cannot be excluded and further
evaluation with MRI is recommended.

## 2017-10-13 ENCOUNTER — Other Ambulatory Visit (HOSPITAL_COMMUNITY): Payer: Self-pay | Admitting: General Practice

## 2017-10-13 ENCOUNTER — Other Ambulatory Visit: Payer: Self-pay | Admitting: Neurology

## 2017-10-13 MED ORDER — EPINEPHRINE 0.3 MG/0.3ML IJ SOAJ: 0.3000 mg | Freq: Once | INTRAMUSCULAR | Status: AC | PRN

## 2017-10-13 MED ORDER — ACETAMINOPHEN 500 MG PO TABS: 1000.0000 mg | ORAL_TABLET | Freq: Four times a day (QID) | ORAL | Status: AC | PRN

## 2017-10-13 MED ORDER — LORATADINE 10 MG PO TABS: 10.0000 mg | ORAL_TABLET | Freq: Once | ORAL | Status: AC | PRN

## 2017-10-13 MED ORDER — SODIUM CHLORIDE 0.9 % IV SOLN: 1000.0000 mg | Freq: Once | INTRAVENOUS | Status: AC

## 2017-10-16 ENCOUNTER — Other Ambulatory Visit: Payer: Self-pay

## 2017-10-17 ENCOUNTER — Other Ambulatory Visit (HOSPITAL_COMMUNITY): Payer: Self-pay | Admitting: General Practice

## 2017-10-18 ENCOUNTER — Encounter: Payer: Self-pay | Admitting: Neurology

## 2017-10-18 ENCOUNTER — Ambulatory Visit (INDEPENDENT_AMBULATORY_CARE_PROVIDER_SITE_OTHER): Payer: Medicaid Other | Admitting: Neurology

## 2017-10-18 ENCOUNTER — Other Ambulatory Visit: Payer: Self-pay

## 2017-10-18 VITALS — BP 116/80 | HR 62 | Resp 16 | Ht 68.0 in | Wt 224.0 lb

## 2017-10-18 DIAGNOSIS — G40909 Epilepsy, unspecified, not intractable, without status epilepticus: Secondary | ICD-10-CM

## 2017-10-18 DIAGNOSIS — G35 Multiple sclerosis: Secondary | ICD-10-CM | POA: Diagnosis not present

## 2017-10-18 DIAGNOSIS — R4189 Other symptoms and signs involving cognitive functions and awareness: Secondary | ICD-10-CM | POA: Diagnosis not present

## 2017-10-18 DIAGNOSIS — G8324 Monoplegia of upper limb affecting left nondominant side: Secondary | ICD-10-CM

## 2017-10-18 DIAGNOSIS — S069X4D Unspecified intracranial injury with loss of consciousness of 6 hours to 24 hours, subsequent encounter: Secondary | ICD-10-CM

## 2017-10-18 NOTE — Patient Instructions (Addendum)
1.  Continue Tysabri monthly 2.  Continue Depakote ER 500mg  twice daily and levetiracetam 500mg  twice daily 3.  Check CBC with diff, vitamin D level and CMP today Your provider has requested that you have labwork completed today. Please go to [facility] ([id number]) on the second floor of this building before leaving the office today. You do not need to check in. If you are not called within 15 minutes please check with the front desk.  4.  Check MRI of brain and cervical spine with and without contrast We have sent a referral to [facility] for your MRI and they will call you directly to schedule your appt. They are located at [address] [facility]. If you need to contact them directly please call [phone]. 5.  Repeat JC Virus antibody with index in 3 months. Your provider has requested that you have labwork completed today. Please go to [facility] ([address]) on the second floor of this building before leaving the office today. You do not need to check in. If you are not called within 15 minutes please check with the front desk.  6.  Repeat CBC with diff, vitamin D level and CMP in 6 months prior to follow up Your provider has requested that you have labwork completed today. Please go to [facility] ([address]) on the second floor of this building before leaving the office today. You do not need to check in. If you are not called within 15 minutes please check with the front desk.  7.  Follow up in 6 months.
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
## 2017-10-19 LAB — CBC WITH DIFFERENTIAL/PLATELET
BASOS ABS: 124 {cells}/uL (ref 0–200)
Basophils Relative: 1.3 %
EOS ABS: 646 {cells}/uL — AB (ref 15–500)
EOS PCT: 6.8 %
HEMATOCRIT: 43.2 % (ref 38.5–50.0)
Hemoglobin: 14.5 g/dL (ref 13.2–17.1)
LYMPHS ABS: 4883 {cells}/uL — AB (ref 850–3900)
MCH: 26.6 pg — ABNORMAL LOW (ref 27.0–33.0)
MCHC: 33.6 g/dL (ref 32.0–36.0)
MCV: 79.3 fL — AB (ref 80.0–100.0)
MPV: 12.7 fL — ABNORMAL HIGH (ref 7.5–12.5)
Monocytes Relative: 5.7 %
NEUTROS ABS: 3306 {cells}/uL (ref 1500–7800)
NEUTROS PCT: 34.8 %
Platelets: 161 10*3/uL (ref 140–400)
RBC: 5.45 10*6/uL (ref 4.20–5.80)
RDW: 14.2 % (ref 11.0–15.0)
Total Lymphocyte: 51.4 %
WBC: 9.5 10*3/uL (ref 3.8–10.8)
WBCMIX: 542 {cells}/uL (ref 200–950)

## 2017-10-19 LAB — COMPREHENSIVE METABOLIC PANEL
AG Ratio: 1.7 (calc) (ref 1.0–2.5)
ALT: 13 U/L (ref 9–46)
AST: 19 U/L (ref 10–40)
Albumin: 4.4 g/dL (ref 3.6–5.1)
Alkaline phosphatase (APISO): 44 U/L (ref 40–115)
BUN: 13 mg/dL (ref 7–25)
CALCIUM: 9.2 mg/dL (ref 8.6–10.3)
CO2: 31 mmol/L (ref 20–32)
CREATININE: 1.16 mg/dL (ref 0.60–1.35)
Chloride: 105 mmol/L (ref 98–110)
GLUCOSE: 86 mg/dL (ref 65–139)
Globulin: 2.6 g/dL (calc) (ref 1.9–3.7)
POTASSIUM: 4.4 mmol/L (ref 3.5–5.3)
SODIUM: 142 mmol/L (ref 135–146)
TOTAL PROTEIN: 7 g/dL (ref 6.1–8.1)
Total Bilirubin: 0.4 mg/dL (ref 0.2–1.2)

## 2017-10-19 LAB — VITAMIN D 25 HYDROXY (VIT D DEFICIENCY, FRACTURES): Vit D, 25-Hydroxy: 32 ng/mL (ref 30–100)

## 2017-10-20 ENCOUNTER — Ambulatory Visit (HOSPITAL_COMMUNITY)
Admission: RE | Admit: 2017-10-20 | Discharge: 2017-10-20 | Disposition: A | Discharge status: Home / Self Care | Payer: Medicaid Other | Source: Ambulatory Visit | Attending: Neurology | Admitting: Neurology

## 2017-10-20 ENCOUNTER — Encounter (HOSPITAL_COMMUNITY): Payer: Self-pay

## 2017-10-20 DIAGNOSIS — G35 Multiple sclerosis: Secondary | ICD-10-CM | POA: Diagnosis not present

## 2017-10-20 MED ORDER — NATALIZUMAB 300 MG/15ML IV CONC
300.0000 mg | Freq: Once | INTRAVENOUS | Status: AC
  Administered 2017-10-20: 300 mg via INTRAVENOUS
  Filled 2017-10-20: qty 15

## 2017-10-20 MED ORDER — ACETAMINOPHEN 325 MG PO TABS
650.0000 mg | ORAL_TABLET | Freq: Once | ORAL | Status: AC
Start: 2017-10-20 — End: 2017-10-20
  Administered 2017-10-20: 650 mg via ORAL
  Filled 2017-10-20: qty 2

## 2017-10-20 MED ORDER — LORATADINE 10 MG PO TABS
10.0000 mg | ORAL_TABLET | Freq: Once | ORAL | Status: AC
  Administered 2017-10-20: 10 mg via ORAL
  Filled 2017-10-20: qty 1

## 2017-10-20 MED ORDER — SODIUM CHLORIDE 0.9 % IV SOLN
Freq: Once | INTRAVENOUS | Status: AC
  Administered 2017-10-20: 12:00:00 via INTRAVENOUS

## 2017-10-20 NOTE — Discharge Instructions (Signed)
°Tysabri °Natalizumab injection °What is this medicine? °NATALIZUMAB (na ta LIZ you mab) is used to treat relapsing multiple sclerosis. This drug is not a cure. It is also used to treat Crohn's disease. °This medicine may be used for other purposes; ask your health care provider or pharmacist if you have questions. °COMMON BRAND NAME(S): Tysabri °What should I tell my health care provider before I take this medicine? °They need to know if you have any of these conditions: °-immune system problems °-progressive multifocal leukoencephalopathy (PML) °-an unusual or allergic reaction to natalizumab, other medicines, foods, dyes, or preservatives °-pregnant or trying to get pregnant °-breast-feeding °How should I use this medicine? °This medicine is for infusion into a vein. It is given by a health care professional in a hospital or clinic setting. °A special MedGuide will be given to you by the pharmacist with each prescription and refill. Be sure to read this information carefully each time. °Talk to your pediatrician regarding the use of this medicine in children. This medicine is not approved for use in children. °Overdosage: If you think you have taken too much of this medicine contact a poison control center or emergency room at once. °NOTE: This medicine is only for you. Do not share this medicine with others. °What if I miss a dose? °It is important not to miss your dose. Call your doctor or health care professional if you are unable to keep an appointment. °What may interact with this medicine? °-azathioprine °-cyclosporine °-interferon °-6-mercaptopurine °-methotrexate °-steroid medicines like prednisone or cortisone °-TNF-alpha inhibitors like adalimumab, etanercept, and infliximab °-vaccines °This list may not describe all possible interactions. Give your health care provider a list of all the medicines, herbs, non-prescription drugs, or dietary supplements you use. Also tell them if you smoke, drink  alcohol, or use illegal drugs. Some items may interact with your medicine. °What should I watch for while using this medicine? °Your condition will be monitored carefully while you are receiving this medicine. Visit your doctor for regular check ups. Tell your doctor or healthcare professional if your symptoms do not start to get better or if they get worse. °Stay away from people who are sick. Call your doctor or health care professional for advice if you get a fever, chills or sore throat, or other symptoms of a cold or flu. Do not treat yourself. °In some patients, this medicine may cause a serious brain infection that may cause death. If you have any problems seeing, thinking, speaking, walking, or standing, tell your doctor right away. If you cannot reach your doctor, get urgent medical care. °What side effects may I notice from receiving this medicine? °Side effects that you should report to your doctor or health care professional as soon as possible: °-allergic reactions like skin rash, itching or hives, swelling of the face, lips, or tongue °-breathing problems °-changes in vision °-chest pain °-dark urine °-depression, feelings of sadness °-dizziness °-general ill feeling or flu-like symptoms °-irregular, missed, or painful menstrual periods °-light-colored stools °-loss of appetite, nausea °-muscle weakness °-problems with balance, talking, or walking °-right upper belly pain °-unusually weak or tired °-yellowing of the eyes or skin °Side effects that usually do not require medical attention (report to your doctor or health care professional if they continue or are bothersome): °-aches, pains °-headache °-stomach upset °-tiredness °This list may not describe all possible side effects. Call your doctor for medical advice about side effects. You may report side effects to FDA at 1-800-FDA-1088. °Where should I   keep my medicine? °This drug is given in a hospital or clinic and will not be stored at  home. °NOTE: This sheet is a summary. It may not cover all possible information. If you have questions about this medicine, talk to your doctor, pharmacist, or health care provider. °© 2018 [employer] Standard (2008-08-30 13:33:21) ° °

## 2017-10-20 NOTE — Progress Notes (Signed)
Patient had uneventful Tysabri infusion. Only able to stay 20 minutes post infusion due to ride. Uncle dropped patient off and picked him up. Patient has list of future appointments.

## 2017-10-23 ENCOUNTER — Telehealth: Payer: Self-pay

## 2017-10-23 NOTE — Telephone Encounter (Signed)
Called Pt, message stated "memory is full" unable to leave message.

## 2017-10-23 NOTE — Telephone Encounter (Signed)
-----   Message from Drema Dallas, DO sent at 10/19/2017  7:34 AM EDT ----- Overall, labs are okay.  I would like his vitamin D level higher.  Recommend starting D3 4000 IU daily and repeat prior to next follow up with other labs.

## 2017-10-26 ENCOUNTER — Telehealth: Payer: Self-pay

## 2017-10-26 NOTE — Telephone Encounter (Addendum)
Attempted to reach Pt, memory is full, unable to leave message. Called Mother at [phone], no VM

## 2017-10-26 NOTE — Telephone Encounter (Signed)
-----   Message from Drema Dallas, DO sent at 10/19/2017  7:34 AM EDT ----- Overall, labs are okay.  I would like his vitamin D level higher.  Recommend starting D3 4000 IU daily and repeat prior to next follow up with other labs.

## 2017-10-30 ENCOUNTER — Telehealth: Payer: Self-pay

## 2017-10-30 NOTE — Telephone Encounter (Signed)
Attempted to reach Pt, no answer, there is now a recording stating memory is full. Unable to leave message.

## 2017-10-30 NOTE — Telephone Encounter (Signed)
-----   Message from Drema Dallas, DO sent at 10/19/2017  7:34 AM EDT ----- Overall, labs are okay.  I would like his vitamin D level higher.  Recommend starting D3 4000 IU daily and repeat prior to next follow up with other labs.

## 2017-11-08 NOTE — Progress Notes (Signed)
Mailed letter to Pt advising of lab results and recommendation to start Vitamin D3 4000 IU

## 2017-11-13 ENCOUNTER — Other Ambulatory Visit: Payer: Self-pay

## 2017-11-16 ENCOUNTER — Encounter: Payer: Self-pay | Admitting: Neurology

## 2017-11-17 ENCOUNTER — Ambulatory Visit (HOSPITAL_COMMUNITY)
Admission: RE | Admit: 2017-11-17 | Discharge: 2017-11-17 | Disposition: A | Discharge status: Home / Self Care | Payer: Medicaid Other | Source: Ambulatory Visit | Attending: Neurology | Admitting: Neurology

## 2017-11-17 DIAGNOSIS — G35 Multiple sclerosis: Secondary | ICD-10-CM | POA: Insufficient documentation

## 2017-11-17 MED ORDER — SODIUM CHLORIDE 0.9 % IV SOLN
300.0000 mg | INTRAVENOUS | Status: DC
  Administered 2017-11-17: 300 mg via INTRAVENOUS
  Filled 2017-11-17: qty 15

## 2017-11-17 MED ORDER — NATALIZUMAB 300 MG/15ML IV CONC
300.0000 mg | INTRAVENOUS | Status: DC
  Filled 2017-11-17: qty 15

## 2017-11-17 MED ORDER — SODIUM CHLORIDE 0.9 % IV SOLN
Freq: Once | INTRAVENOUS | Status: AC
  Administered 2017-11-17: 10:00:00 via INTRAVENOUS

## 2017-11-17 MED ORDER — ACETAMINOPHEN 325 MG PO TABS
650.0000 mg | ORAL_TABLET | Freq: Once | ORAL | Status: AC
  Administered 2017-11-17: 650 mg via ORAL
  Filled 2017-11-17: qty 2

## 2017-12-06 ENCOUNTER — Other Ambulatory Visit: Payer: Self-pay

## 2017-12-08 ENCOUNTER — Other Ambulatory Visit (HOSPITAL_COMMUNITY): Payer: Self-pay | Admitting: General Practice

## 2017-12-08 ENCOUNTER — Other Ambulatory Visit: Payer: Self-pay

## 2017-12-15 ENCOUNTER — Ambulatory Visit (HOSPITAL_COMMUNITY)
Admission: RE | Admit: 2017-12-15 | Discharge: 2017-12-15 | Disposition: A | Discharge status: Home / Self Care | Payer: Medicaid Other | Source: Ambulatory Visit | Attending: Neurology | Admitting: Neurology

## 2017-12-15 ENCOUNTER — Encounter (HOSPITAL_COMMUNITY): Payer: Self-pay

## 2017-12-15 DIAGNOSIS — G35 Multiple sclerosis: Secondary | ICD-10-CM | POA: Diagnosis present

## 2017-12-15 MED ORDER — SODIUM CHLORIDE 0.9 % IV SOLN
300.0000 mg | INTRAVENOUS | Status: DC
  Administered 2017-12-15: 300 mg via INTRAVENOUS
  Filled 2017-12-15: qty 15

## 2017-12-15 MED ORDER — SODIUM CHLORIDE 0.9 % IV SOLN
INTRAVENOUS | Status: DC
  Administered 2017-12-15: 11:00:00 via INTRAVENOUS

## 2017-12-15 NOTE — Discharge Instructions (Signed)
Natalizumab injection/Tysabri Infusion  °What is this medicine? °NATALIZUMAB (na ta LIZ you mab) is used to treat relapsing multiple sclerosis. This drug is not a cure. It is also used to treat Crohn's disease. °This medicine may be used for other purposes; ask your health care provider or pharmacist if you have questions. °COMMON BRAND NAME(S): Tysabri °What should I tell my health care provider before I take this medicine? °They need to know if you have any of these conditions: °-immune system problems °-progressive multifocal leukoencephalopathy (PML) °-an unusual or allergic reaction to natalizumab, other medicines, foods, dyes, or preservatives °-pregnant or trying to get pregnant °-breast-feeding °How should I use this medicine? °This medicine is for infusion into a vein. It is given by a health care professional in a hospital or clinic setting. °A special MedGuide will be given to you by the pharmacist with each prescription and refill. Be sure to read this information carefully each time. °Talk to your pediatrician regarding the use of this medicine in children. This medicine is not approved for use in children. °Overdosage: If you think you have taken too much of this medicine contact a poison control center or emergency room at once. °NOTE: This medicine is only for you. Do not share this medicine with others. °What if I miss a dose? °It is important not to miss your dose. Call your doctor or health care professional if you are unable to keep an appointment. °What may interact with this medicine? °-azathioprine °-cyclosporine °-interferon °-6-mercaptopurine °-methotrexate °-steroid medicines like prednisone or cortisone °-TNF-alpha inhibitors like adalimumab, etanercept, and infliximab °-vaccines °This list may not describe all possible interactions. Give your health care provider a list of all the medicines, herbs, non-prescription drugs, or dietary supplements you use. Also tell them if you smoke, drink  alcohol, or use illegal drugs. Some items may interact with your medicine. °What should I watch for while using this medicine? °Your condition will be monitored carefully while you are receiving this medicine. Visit your doctor for regular check ups. Tell your doctor or healthcare professional if your symptoms do not start to get better or if they get worse. °Stay away from people who are sick. Call your doctor or health care professional for advice if you get a fever, chills or sore throat, or other symptoms of a cold or flu. Do not treat yourself. °In some patients, this medicine may cause a serious brain infection that may cause death. If you have any problems seeing, thinking, speaking, walking, or standing, tell your doctor right away. If you cannot reach your doctor, get urgent medical care. °What side effects may I notice from receiving this medicine? °Side effects that you should report to your doctor or health care professional as soon as possible: °-allergic reactions like skin rash, itching or hives, swelling of the face, lips, or tongue °-breathing problems °-changes in vision °-chest pain °-dark urine °-depression, feelings of sadness °-dizziness °-general ill feeling or flu-like symptoms °-irregular, missed, or painful menstrual periods °-light-colored stools °-loss of appetite, nausea °-muscle weakness °-problems with balance, talking, or walking °-right upper belly pain °-unusually weak or tired °-yellowing of the eyes or skin °Side effects that usually do not require medical attention (report to your doctor or health care professional if they continue or are bothersome): °-aches, pains °-headache °-stomach upset °-tiredness °This list may not describe all possible side effects. Call your doctor for medical advice about side effects. You may report side effects to FDA at 1-800-FDA-1088. °Where should   I keep my medicine? °This drug is given in a hospital or clinic and will not be stored at  home. °NOTE: This sheet is a summary. It may not cover all possible information. If you have questions about this medicine, talk to your doctor, pharmacist, or health care provider. °© 2018 Elsevier/Gold Standard (2008-08-30 13:33:21) ° °

## 2017-12-15 NOTE — Progress Notes (Signed)
Uneventful Tysabri infusion. Stayed for 45 minutes observation post infusion. Aware to call MD for problems and concerns.

## 2017-12-26 ENCOUNTER — Telehealth: Payer: Self-pay | Admitting: Neurology

## 2017-12-26 NOTE — Telephone Encounter (Signed)
Debbie called regarding this patient and a form that she has faxed over that is needing to be completed by Dr. Everlena Cooper because the Authorization will expire on 01/10/18. Please Call. Thanks

## 2017-12-28 NOTE — Telephone Encounter (Signed)
Rcvd form, faxing back to [phone]

## 2017-12-28 NOTE — Telephone Encounter (Signed)
Called and spoke with Victor [name], she is faxing form.

## 2018-01-19 ENCOUNTER — Encounter (HOSPITAL_COMMUNITY): Payer: Self-pay

## 2018-01-19 ENCOUNTER — Encounter (HOSPITAL_COMMUNITY)
Admission: RE | Admit: 2018-01-19 | Discharge: 2018-01-19 | Disposition: A | Discharge status: Home / Self Care | Payer: Medicaid Other | Source: Ambulatory Visit | Attending: Neurology | Admitting: Neurology

## 2018-01-19 DIAGNOSIS — G35 Multiple sclerosis: Secondary | ICD-10-CM | POA: Insufficient documentation

## 2018-01-19 MED ORDER — SODIUM CHLORIDE 0.9 % IV SOLN
INTRAVENOUS | Status: DC
  Administered 2018-01-19: 13:00:00 via INTRAVENOUS

## 2018-01-19 MED ORDER — SODIUM CHLORIDE 0.9 % IV SOLN
300.0000 mg | INTRAVENOUS | Status: DC
  Administered 2018-01-19: 300 mg via INTRAVENOUS
  Filled 2018-01-19: qty 15

## 2018-01-19 NOTE — Progress Notes (Signed)
Patient had uneventful infusion of Tysabri. Able to only stay 30 min post infusion for observation. Uncle here to pick patient up. Has list of next appointments.

## 2018-02-16 ENCOUNTER — Encounter (HOSPITAL_COMMUNITY): Payer: Medicaid Other

## 2018-02-22 ENCOUNTER — Telehealth: Payer: Self-pay

## 2018-02-22 DIAGNOSIS — G35 Multiple sclerosis: Secondary | ICD-10-CM

## 2018-02-22 DIAGNOSIS — Z79899 Other long term (current) drug therapy: Secondary | ICD-10-CM

## 2018-02-22 NOTE — Telephone Encounter (Signed)
Called and spoke with Victor [name]. Advised her Dr Everlena Cooper wants Pt to have labs ASAP. He did not get them as requested before. She will bring him tomorrow

## 2018-02-23 ENCOUNTER — Other Ambulatory Visit (INDEPENDENT_AMBULATORY_CARE_PROVIDER_SITE_OTHER): Payer: Medicaid Other

## 2018-02-23 DIAGNOSIS — Z79899 Other long term (current) drug therapy: Secondary | ICD-10-CM

## 2018-02-23 DIAGNOSIS — G35 Multiple sclerosis: Secondary | ICD-10-CM | POA: Diagnosis not present

## 2018-02-23 LAB — CBC WITH DIFFERENTIAL/PLATELET
BASOS ABS: 0.1 10*3/uL (ref 0.0–0.1)
Basophils Relative: 1.1 % (ref 0.0–3.0)
Eosinophils Absolute: 0.8 10*3/uL — ABNORMAL HIGH (ref 0.0–0.7)
Eosinophils Relative: 7.9 % — ABNORMAL HIGH (ref 0.0–5.0)
HCT: 45.4 % (ref 39.0–52.0)
Hemoglobin: 14.9 g/dL (ref 13.0–17.0)
LYMPHS ABS: 4.2 10*3/uL — AB (ref 0.7–4.0)
Lymphocytes Relative: 43.1 % (ref 12.0–46.0)
MCHC: 32.9 g/dL (ref 30.0–36.0)
MCV: 82.7 fl (ref 78.0–100.0)
MONO ABS: 0.8 10*3/uL (ref 0.1–1.0)
Monocytes Relative: 8.7 % (ref 3.0–12.0)
Neutro Abs: 3.8 10*3/uL (ref 1.4–7.7)
Neutrophils Relative %: 39.2 % — ABNORMAL LOW (ref 43.0–77.0)
PLATELETS: 175 10*3/uL (ref 150.0–400.0)
RBC: 5.49 Mil/uL (ref 4.22–5.81)
RDW: 14.8 % (ref 11.5–15.5)
WBC: 9.7 10*3/uL (ref 4.0–10.5)

## 2018-02-23 LAB — COMPREHENSIVE METABOLIC PANEL
ALK PHOS: 44 U/L (ref 39–117)
ALT: 15 U/L (ref 0–53)
AST: 17 U/L (ref 0–37)
Albumin: 4.6 g/dL (ref 3.5–5.2)
BILIRUBIN TOTAL: 0.3 mg/dL (ref 0.2–1.2)
BUN: 14 mg/dL (ref 6–23)
CO2: 31 mEq/L (ref 19–32)
Calcium: 9.6 mg/dL (ref 8.4–10.5)
Chloride: 105 mEq/L (ref 96–112)
Creatinine, Ser: 1.18 mg/dL (ref 0.40–1.50)
GFR: 95.7 mL/min (ref >60.00)
GLUCOSE: 78 mg/dL (ref 70–99)
Potassium: 4.4 mEq/L (ref 3.5–5.1)
SODIUM: 143 meq/L (ref 135–145)
TOTAL PROTEIN: 7.5 g/dL (ref 6.0–8.3)

## 2018-02-26 LAB — JC VIRUS DNA,PCR (WHOLE BLOOD): JC Virus DNA, PCR, Blood: NEGATIVE

## 2018-02-26 LAB — VITAMIN D 1,25 DIHYDROXY
VITAMIN D3 1, 25 (OH): 35 pg/mL
Vitamin D 1, 25 (OH)2 Total: 35 pg/mL (ref 18–72)
Vitamin D2 1, 25 (OH)2: <8 pg/mL

## 2018-03-15 ENCOUNTER — Other Ambulatory Visit (HOSPITAL_COMMUNITY): Payer: Self-pay | Admitting: General Practice

## 2018-03-16 ENCOUNTER — Other Ambulatory Visit (HOSPITAL_COMMUNITY): Payer: Self-pay | Admitting: General Practice

## 2018-03-19 ENCOUNTER — Other Ambulatory Visit: Payer: Self-pay

## 2018-03-22 ENCOUNTER — Encounter (HOSPITAL_COMMUNITY)
Admission: RE | Admit: 2018-03-22 | Discharge: 2018-03-22 | Disposition: A | Discharge status: Home / Self Care | Payer: Medicaid Other | Source: Ambulatory Visit | Attending: Neurology | Admitting: Neurology

## 2018-03-22 ENCOUNTER — Encounter (HOSPITAL_COMMUNITY): Payer: Self-pay

## 2018-03-22 DIAGNOSIS — G35 Multiple sclerosis: Secondary | ICD-10-CM | POA: Diagnosis present

## 2018-03-22 MED ORDER — SODIUM CHLORIDE 0.9 % IV SOLN
300.0000 mg | INTRAVENOUS | Status: DC
  Administered 2018-03-22: 300 mg via INTRAVENOUS
  Filled 2018-03-22: qty 15

## 2018-03-22 MED ORDER — SODIUM CHLORIDE 0.9 % IV SOLN
INTRAVENOUS | Status: DC
  Administered 2018-03-22: 11:00:00 via INTRAVENOUS

## 2018-03-22 NOTE — Progress Notes (Signed)
Pt tolerated infusion without event and chose to leave with uncle Kevin Fenton early, not wanting to stay total 1 hour post infusion observation time. Uncle confirms that he has future appointment times.  Pt was no show for July appointment.
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
## 2018-04-13 ENCOUNTER — Ambulatory Visit: Payer: Self-pay | Admitting: Neurology

## 2018-04-13 ENCOUNTER — Encounter: Payer: Self-pay | Admitting: Neurology

## 2018-04-19 ENCOUNTER — Encounter (HOSPITAL_COMMUNITY): Payer: Self-pay

## 2018-04-19 ENCOUNTER — Encounter (HOSPITAL_COMMUNITY)
Admission: RE | Admit: 2018-04-19 | Discharge: 2018-04-19 | Disposition: A | Discharge status: Home / Self Care | Payer: Medicaid Other | Source: Ambulatory Visit | Attending: Neurology | Admitting: Neurology

## 2018-04-19 DIAGNOSIS — G35 Multiple sclerosis: Secondary | ICD-10-CM | POA: Insufficient documentation

## 2018-04-19 MED ORDER — SODIUM CHLORIDE 0.9 % IV SOLN
300.0000 mg | INTRAVENOUS | Status: DC
  Administered 2018-04-19: 300 mg via INTRAVENOUS
  Filled 2018-04-19: qty 15

## 2018-04-19 MED ORDER — SODIUM CHLORIDE 0.9 % IV SOLN
INTRAVENOUS | Status: DC
  Administered 2018-04-19: 08:00:00 via INTRAVENOUS

## 2018-04-20 ENCOUNTER — Ambulatory Visit: Payer: Self-pay | Admitting: Neurology

## 2018-04-26 ENCOUNTER — Ambulatory Visit: Payer: Self-pay | Admitting: Neurology
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
## 2018-05-08 ENCOUNTER — Encounter: Payer: Self-pay | Admitting: Neurology

## 2018-05-08 ENCOUNTER — Ambulatory Visit (INDEPENDENT_AMBULATORY_CARE_PROVIDER_SITE_OTHER): Payer: Medicaid Other | Admitting: Neurology

## 2018-05-08 VITALS — BP 108/58 | HR 67 | Ht 69.0 in | Wt 223.0 lb

## 2018-05-08 DIAGNOSIS — S069X4D Unspecified intracranial injury with loss of consciousness of 6 hours to 24 hours, subsequent encounter: Secondary | ICD-10-CM

## 2018-05-08 DIAGNOSIS — G40909 Epilepsy, unspecified, not intractable, without status epilepticus: Secondary | ICD-10-CM | POA: Diagnosis not present

## 2018-05-08 DIAGNOSIS — R4189 Other symptoms and signs involving cognitive functions and awareness: Secondary | ICD-10-CM | POA: Diagnosis not present

## 2018-05-08 DIAGNOSIS — Z79899 Other long term (current) drug therapy: Secondary | ICD-10-CM

## 2018-05-08 DIAGNOSIS — G35 Multiple sclerosis: Secondary | ICD-10-CM

## 2018-05-08 NOTE — Patient Instructions (Addendum)
1.  We will check MRI of brain and cervical spine with and without contrast 2.  Continue Tysabri monthly 3.  Continue Depakote ER 500mg  twice daily and Keppra 500mg  twice daily 4.  Check CBC with diff, vitamin D level, CMP and JC Virus antibody with index in 4 months 5.  Follow up in 6 months (after repeat labs)  We have sent a referral to [facility] for your MRI and they will call you directly to schedule your appt. They are located at [address] [facility] If you need to contact them directly please call [phone].

## 2018-05-18 ENCOUNTER — Encounter (HOSPITAL_COMMUNITY)
Admission: RE | Admit: 2018-05-18 | Discharge: 2018-05-18 | Disposition: A | Discharge status: Home / Self Care | Payer: Medicaid Other | Source: Ambulatory Visit | Attending: Neurology | Admitting: Neurology

## 2018-05-18 ENCOUNTER — Encounter (HOSPITAL_COMMUNITY): Payer: Self-pay

## 2018-05-18 DIAGNOSIS — G35 Multiple sclerosis: Secondary | ICD-10-CM | POA: Insufficient documentation

## 2018-05-18 MED ORDER — SODIUM CHLORIDE 0.9 % IV SOLN
INTRAVENOUS | Status: DC
  Administered 2018-05-18: 10:00:00 via INTRAVENOUS

## 2018-05-18 MED ORDER — SODIUM CHLORIDE 0.9 % IV SOLN
300.0000 mg | INTRAVENOUS | Status: DC
  Administered 2018-05-18: 300 mg via INTRAVENOUS
  Filled 2018-05-18: qty 15

## 2018-05-18 NOTE — Discharge Instructions (Signed)
Natalizumab injection/infusion  What is this medicine? NATALIZUMAB (na ta LIZ you mab) is used to treat relapsing multiple sclerosis. This drug is not a cure. It is also used to treat Crohn's disease. This medicine may be used for other purposes; ask your health care provider or pharmacist if you have questions. COMMON BRAND NAME(S): Tysabri What should I tell my health care provider before I take this medicine? They need to know if you have any of these conditions: -immune system problems -progressive multifocal leukoencephalopathy (PML) -an unusual or allergic reaction to natalizumab, other medicines, foods, dyes, or preservatives -pregnant or trying to get pregnant -breast-feeding How should I use this medicine? This medicine is for infusion into a vein. It is given by a health care professional in a hospital or clinic setting. A special MedGuide will be given to you by the pharmacist with each prescription and refill. Be sure to read this information carefully each time. Talk to your pediatrician regarding the use of this medicine in children. This medicine is not approved for use in children. Overdosage: If you think you have taken too much of this medicine contact a poison control center or emergency room at once. NOTE: This medicine is only for you. Do not share this medicine with others. What if I miss a dose? It is important not to miss your dose. Call your doctor or health care professional if you are unable to keep an appointment. What may interact with this medicine? -azathioprine -cyclosporine -interferon -6-mercaptopurine -methotrexate -steroid medicines like prednisone or cortisone -TNF-alpha inhibitors like adalimumab, etanercept, and infliximab -vaccines This list may not describe all possible interactions. Give your health care provider a list of all the medicines, herbs, non-prescription drugs, or dietary supplements you use. Also tell them if you smoke, drink alcohol,  or use illegal drugs. Some items may interact with your medicine. What should I watch for while using this medicine? Your condition will be monitored carefully while you are receiving this medicine. Visit your doctor for regular check ups. Tell your doctor or healthcare professional if your symptoms do not start to get better or if they get worse. Stay away from people who are sick. Call your doctor or health care professional for advice if you get a fever, chills or sore throat, or other symptoms of a cold or flu. Do not treat yourself. In some patients, this medicine may cause a serious brain infection that may cause death. If you have any problems seeing, thinking, speaking, walking, or standing, tell your doctor right away. If you cannot reach your doctor, get urgent medical care. What side effects may I notice from receiving this medicine? Side effects that you should report to your doctor or health care professional as soon as possible: -allergic reactions like skin rash, itching or hives, swelling of the face, lips, or tongue -breathing problems -changes in vision -chest pain -dark urine -depression, feelings of sadness -dizziness -general ill feeling or flu-like symptoms -irregular, missed, or painful menstrual periods -light-colored stools -loss of appetite, nausea -muscle weakness -problems with balance, talking, or walking -right upper belly pain -unusually weak or tired -yellowing of the eyes or skin Side effects that usually do not require medical attention (report to your doctor or health care professional if they continue or are bothersome): -aches, pains -headache -stomach upset -tiredness This list may not describe all possible side effects. Call your doctor for medical advice about side effects. You may report side effects to FDA at 1-800-FDA-1088. Where should I  keep my medicine? This drug is given in a hospital or clinic and will not be stored at home. NOTE: This  sheet is a summary. It may not cover all possible information. If you have questions about this medicine, talk to your doctor, pharmacist, or health care provider.  2018 Elsevier/Gold Standard (2008-08-30 13:33:21)

## 2018-06-14 ENCOUNTER — Other Ambulatory Visit (HOSPITAL_COMMUNITY): Payer: Self-pay | Admitting: General Practice

## 2018-06-15 ENCOUNTER — Other Ambulatory Visit (HOSPITAL_COMMUNITY): Payer: Self-pay | Admitting: General Practice

## 2018-06-18 ENCOUNTER — Other Ambulatory Visit (HOSPITAL_COMMUNITY): Payer: Self-pay | Admitting: General Practice

## 2018-06-18 ENCOUNTER — Other Ambulatory Visit: Payer: Self-pay

## 2018-06-20 ENCOUNTER — Encounter (HOSPITAL_COMMUNITY)
Admission: RE | Admit: 2018-06-20 | Discharge: 2018-06-20 | Disposition: A | Discharge status: Home / Self Care | Payer: Medicaid Other | Source: Ambulatory Visit | Attending: Neurology | Admitting: Neurology

## 2018-06-20 ENCOUNTER — Encounter (HOSPITAL_COMMUNITY): Payer: Self-pay

## 2018-06-20 DIAGNOSIS — G35 Multiple sclerosis: Secondary | ICD-10-CM | POA: Diagnosis present

## 2018-06-20 MED ORDER — LORATADINE 10 MG PO TABS
10.0000 mg | ORAL_TABLET | Freq: Once | ORAL | Status: AC
  Administered 2018-06-20: 10 mg via ORAL
  Filled 2018-06-20: qty 1

## 2018-06-20 MED ORDER — EPINEPHRINE 0.3 MG/0.3ML IJ SOAJ: 0.3000 mg | Freq: Once | INTRAMUSCULAR | Status: DC

## 2018-06-20 MED ORDER — ACETAMINOPHEN 325 MG PO TABS
650.0000 mg | ORAL_TABLET | Freq: Once | ORAL | Status: AC
  Administered 2018-06-20: 650 mg via ORAL
  Filled 2018-06-20: qty 2

## 2018-06-20 MED ORDER — SODIUM CHLORIDE 0.9 % IV SOLN
Freq: Once | INTRAVENOUS | Status: AC
  Administered 2018-06-20: 09:00:00 via INTRAVENOUS

## 2018-06-20 MED ORDER — SODIUM CHLORIDE 0.9 % IV SOLN
300.0000 mg | INTRAVENOUS | Status: DC
  Administered 2018-06-20: 300 mg via INTRAVENOUS
  Filled 2018-06-20: qty 15

## 2018-06-20 NOTE — Discharge Instructions (Signed)

## 2018-07-05 ENCOUNTER — Other Ambulatory Visit: Payer: Self-pay

## 2018-07-24 ENCOUNTER — Encounter (HOSPITAL_COMMUNITY)
Admission: RE | Admit: 2018-07-24 | Discharge: 2018-07-24 | Disposition: A | Discharge status: Home / Self Care | Payer: Medicaid Other | Source: Ambulatory Visit | Attending: Neurology | Admitting: Neurology

## 2018-07-24 DIAGNOSIS — G35 Multiple sclerosis: Secondary | ICD-10-CM | POA: Insufficient documentation

## 2018-07-24 MED ORDER — SODIUM CHLORIDE 0.9 % IV SOLN
300.0000 mg | INTRAVENOUS | Status: DC
  Administered 2018-07-24: 300 mg via INTRAVENOUS
  Filled 2018-07-24: qty 15

## 2018-07-24 MED ORDER — SODIUM CHLORIDE 0.9 % IV SOLN
INTRAVENOUS | Status: DC
  Administered 2018-07-24: 09:00:00 via INTRAVENOUS

## 2018-07-24 NOTE — Discharge Instructions (Signed)
Natalizumab injection/Tysabri Infusion ° °What is this medicine? °NATALIZUMAB (na ta LIZ you mab) is used to treat relapsing multiple sclerosis. This drug is not a cure. It is also used to treat Crohn's disease. °This medicine may be used for other purposes; ask your health care provider or pharmacist if you have questions. °COMMON BRAND NAME(S): Tysabri °What should I tell my health care provider before I take this medicine? °They need to know if you have any of these conditions: °-immune system problems °-progressive multifocal leukoencephalopathy (PML) °-an unusual or allergic reaction to natalizumab, other medicines, foods, dyes, or preservatives °-pregnant or trying to get pregnant °-breast-feeding °How should I use this medicine? °This medicine is for infusion into a vein. It is given by a health care professional in a hospital or clinic setting. °A special MedGuide will be given to you by the pharmacist with each prescription and refill. Be sure to read this information carefully each time. °Talk to your pediatrician regarding the use of this medicine in children. This medicine is not approved for use in children. °Overdosage: If you think you have taken too much of this medicine contact a poison control center or emergency room at once. °NOTE: This medicine is only for you. Do not share this medicine with others. °What if I miss a dose? °It is important not to miss your dose. Call your doctor or health care professional if you are unable to keep an appointment. °What may interact with this medicine? °-azathioprine °-cyclosporine °-interferon °-6-mercaptopurine °-methotrexate °-steroid medicines like prednisone or cortisone °-TNF-alpha inhibitors like adalimumab, etanercept, and infliximab °-vaccines °This list may not describe all possible interactions. Give your health care provider a list of all the medicines, herbs, non-prescription drugs, or dietary supplements you use. Also tell them if you smoke, drink  alcohol, or use illegal drugs. Some items may interact with your medicine. °What should I watch for while using this medicine? °Your condition will be monitored carefully while you are receiving this medicine. Visit your doctor for regular check ups. Tell your doctor or healthcare professional if your symptoms do not start to get better or if they get worse. °Stay away from people who are sick. Call your doctor or health care professional for advice if you get a fever, chills or sore throat, or other symptoms of a cold or flu. Do not treat yourself. °In some patients, this medicine may cause a serious brain infection that may cause death. If you have any problems seeing, thinking, speaking, walking, or standing, tell your doctor right away. If you cannot reach your doctor, get urgent medical care. °What side effects may I notice from receiving this medicine? °Side effects that you should report to your doctor or health care professional as soon as possible: °-allergic reactions like skin rash, itching or hives, swelling of the face, lips, or tongue °-breathing problems °-changes in vision °-chest pain °-dark urine °-depression, feelings of sadness °-dizziness °-general ill feeling or flu-like symptoms °-irregular, missed, or painful menstrual periods °-light-colored stools °-loss of appetite, nausea °-muscle weakness °-problems with balance, talking, or walking °-right upper belly pain °-unusually weak or tired °-yellowing of the eyes or skin °Side effects that usually do not require medical attention (report to your doctor or health care professional if they continue or are bothersome): °-aches, pains °-headache °-stomach upset °-tiredness °This list may not describe all possible side effects. Call your doctor for medical advice about side effects. You may report side effects to FDA at 1-800-FDA-1088. °Where should   I keep my medicine? °This drug is given in a hospital or clinic and will not be stored at  home. °NOTE: This sheet is a summary. It may not cover all possible information. If you have questions about this medicine, talk to your doctor, pharmacist, or health care provider. °© 2019 Elsevier/Gold Standard (2008-08-30 13:33:21) ° °

## 2018-07-24 NOTE — Progress Notes (Signed)
Pt had uneventful Tysabri Infusion; Pt stayed approx 15 mins post Touch Infusion 1 hour recommendation; pt discharged to car with pts uncle Ginnie Smart.

## 2018-08-21 ENCOUNTER — Encounter (HOSPITAL_COMMUNITY)
Admission: RE | Admit: 2018-08-21 | Discharge: 2018-08-21 | Disposition: A | Discharge status: Home / Self Care | Payer: Medicaid Other | Source: Ambulatory Visit | Attending: Neurology | Admitting: Neurology

## 2018-08-21 ENCOUNTER — Encounter (HOSPITAL_COMMUNITY): Payer: Self-pay

## 2018-08-21 DIAGNOSIS — G35 Multiple sclerosis: Secondary | ICD-10-CM | POA: Insufficient documentation

## 2018-08-21 MED ORDER — SODIUM CHLORIDE 0.9 % IV SOLN
300.0000 mg | INTRAVENOUS | Status: AC
  Administered 2018-08-21: 300 mg via INTRAVENOUS
  Filled 2018-08-21: qty 15

## 2018-08-21 MED ORDER — SODIUM CHLORIDE 0.9 % IV SOLN
INTRAVENOUS | Status: AC
  Administered 2018-08-21: 08:00:00 via INTRAVENOUS

## 2018-08-21 MED ORDER — ACETAMINOPHEN 325 MG PO TABS
650.0000 mg | ORAL_TABLET | ORAL | Status: DC
  Administered 2018-08-21: 650 mg via ORAL
  Filled 2018-08-21: qty 2

## 2018-08-21 MED ORDER — LORATADINE 10 MG PO TABS
10.0000 mg | ORAL_TABLET | ORAL | Status: DC
  Administered 2018-08-21: 10 mg via ORAL
  Filled 2018-08-21: qty 1

## 2018-08-21 NOTE — Progress Notes (Signed)
tysabri infusion complete.  Pt's uncle was called, spoke with him and he states he will be here at 1000.  Informed uncle we would take Victor [name] to the main lobby to sit and wait for pick-up, uncle voiced understanding.

## 2018-09-11 ENCOUNTER — Telehealth: Payer: Self-pay

## 2018-09-11 ENCOUNTER — Other Ambulatory Visit (HOSPITAL_COMMUNITY): Payer: Self-pay | Admitting: General Practice

## 2018-09-11 NOTE — Telephone Encounter (Signed)
Called [facility] advising Pt must come for labs prior to having any further Tysabri infusions

## 2018-09-17 ENCOUNTER — Other Ambulatory Visit (HOSPITAL_COMMUNITY): Payer: Self-pay | Admitting: General Practice

## 2018-09-17 ENCOUNTER — Other Ambulatory Visit (INDEPENDENT_AMBULATORY_CARE_PROVIDER_SITE_OTHER): Payer: Medicaid Other

## 2018-09-17 DIAGNOSIS — Z79899 Other long term (current) drug therapy: Secondary | ICD-10-CM

## 2018-09-17 LAB — COMPREHENSIVE METABOLIC PANEL
ALBUMIN: 4.4 g/dL (ref 3.5–5.2)
ALK PHOS: 43 U/L (ref 39–117)
ALT: 16 U/L (ref 0–53)
AST: 17 U/L (ref 0–37)
BILIRUBIN TOTAL: 0.4 mg/dL (ref 0.2–1.2)
BUN: 17 mg/dL (ref 6–23)
CALCIUM: 9.3 mg/dL (ref 8.4–10.5)
CO2: 31 meq/L (ref 19–32)
CREATININE: 1.04 mg/dL (ref 0.40–1.50)
Chloride: 104 mEq/L (ref 96–112)
GFR: 103.72 mL/min (ref >60.00)
Glucose, Bld: 76 mg/dL (ref 70–99)
Potassium: 4.5 mEq/L (ref 3.5–5.1)
Sodium: 142 mEq/L (ref 135–145)
TOTAL PROTEIN: 7.1 g/dL (ref 6.0–8.3)

## 2018-09-17 LAB — CBC WITH DIFFERENTIAL/PLATELET
BASOS ABS: 0.1 10*3/uL (ref 0.0–0.1)
Basophils Relative: 0.6 % (ref 0.0–3.0)
EOS ABS: 0.5 10*3/uL (ref 0.0–0.7)
Eosinophils Relative: 5.9 % — ABNORMAL HIGH (ref 0.0–5.0)
HEMATOCRIT: 44.1 % (ref 39.0–52.0)
HEMOGLOBIN: 14.6 g/dL (ref 13.0–17.0)
LYMPHS PCT: 50.5 % — AB (ref 12.0–46.0)
Lymphs Abs: 4.1 10*3/uL — ABNORMAL HIGH (ref 0.7–4.0)
MCHC: 33.1 g/dL (ref 30.0–36.0)
MCV: 81.2 fl (ref 78.0–100.0)
Monocytes Absolute: 0.6 10*3/uL (ref 0.1–1.0)
Monocytes Relative: 7 % (ref 3.0–12.0)
Neutro Abs: 2.9 10*3/uL (ref 1.4–7.7)
Neutrophils Relative %: 36 % — ABNORMAL LOW (ref 43.0–77.0)
Platelets: 157 10*3/uL (ref 150.0–400.0)
RBC: 5.43 Mil/uL (ref 4.22–5.81)
RDW: 14.3 % (ref 11.5–15.5)
WBC: 8.1 10*3/uL (ref 4.0–10.5)

## 2018-09-17 LAB — VITAMIN D 25 HYDROXY (VIT D DEFICIENCY, FRACTURES): VITD: 27.18 ng/mL — AB (ref 30.00–100.00)

## 2018-09-18 ENCOUNTER — Other Ambulatory Visit (HOSPITAL_COMMUNITY): Payer: Self-pay | Admitting: General Practice

## 2018-09-18 ENCOUNTER — Encounter (HOSPITAL_COMMUNITY): Payer: Medicaid Other

## 2018-09-27 LAB — STRATIFY JCV(TM) AB W/INDEX
JCV ANTIBODY BY INHIBITION: NEGATIVE
JCV INDEX VALUE: 0.3

## 2018-10-15 ENCOUNTER — Other Ambulatory Visit: Payer: Self-pay

## 2018-10-15 ENCOUNTER — Other Ambulatory Visit (HOSPITAL_COMMUNITY): Payer: Self-pay | Admitting: General Practice

## 2018-10-16 ENCOUNTER — Encounter (HOSPITAL_COMMUNITY)
Admission: RE | Admit: 2018-10-16 | Discharge: 2018-10-16 | Disposition: A | Discharge status: Home / Self Care | Payer: Medicaid Other | Source: Ambulatory Visit | Attending: Neurology | Admitting: Neurology

## 2018-10-16 ENCOUNTER — Other Ambulatory Visit: Payer: Self-pay

## 2018-10-16 DIAGNOSIS — G35 Multiple sclerosis: Secondary | ICD-10-CM | POA: Diagnosis not present

## 2018-10-16 LAB — GLUCOSE, CAPILLARY: Glucose-Capillary: 96 mg/dL (ref 70–99)

## 2018-10-16 MED ORDER — SODIUM CHLORIDE 0.9 % IV SOLN
300.0000 mg | INTRAVENOUS | Status: DC
  Administered 2018-10-16: 300 mg via INTRAVENOUS
  Filled 2018-10-16: qty 15

## 2018-10-16 MED ORDER — SODIUM CHLORIDE 0.9 % IV SOLN
Freq: Once | INTRAVENOUS | Status: AC
  Administered 2018-10-16: 10:00:00 via INTRAVENOUS

## 2018-10-16 MED ORDER — ACETAMINOPHEN 325 MG PO TABS
650.0000 mg | ORAL_TABLET | Freq: Once | ORAL | Status: AC
  Administered 2018-10-16: 650 mg via ORAL
  Filled 2018-10-16: qty 2

## 2018-10-16 MED ORDER — LORATADINE 10 MG PO TABS
10.0000 mg | ORAL_TABLET | Freq: Every day | ORAL | Status: DC
  Administered 2018-10-16: 10 mg via ORAL
  Filled 2018-10-16: qty 1

## 2018-10-16 NOTE — Progress Notes (Signed)
Called pt's Uncle,Jerome, and told him pt was done, he states he will arrive in about 15 minutes.  Informed uncle he would be in the main lobbby in chair.  Uncle voiced understanding.

## 2018-11-07 ENCOUNTER — Other Ambulatory Visit (HOSPITAL_COMMUNITY): Payer: Self-pay | Admitting: General Practice

## 2018-11-07 ENCOUNTER — Ambulatory Visit: Payer: Self-pay | Admitting: Neurology

## 2018-11-14 ENCOUNTER — Other Ambulatory Visit: Payer: Self-pay

## 2018-11-14 ENCOUNTER — Ambulatory Visit (HOSPITAL_COMMUNITY)
Admission: RE | Admit: 2018-11-14 | Discharge: 2018-11-14 | Disposition: A | Discharge status: Home / Self Care | Payer: Medicaid Other | Source: Ambulatory Visit | Attending: Neurology | Admitting: Neurology

## 2018-11-14 ENCOUNTER — Encounter (HOSPITAL_COMMUNITY): Payer: Medicaid Other

## 2018-11-14 DIAGNOSIS — G35 Multiple sclerosis: Secondary | ICD-10-CM | POA: Diagnosis present

## 2018-11-14 MED ORDER — SODIUM CHLORIDE 0.9 % IV SOLN
300.0000 mg | INTRAVENOUS | Status: DC
  Administered 2018-11-14: 300 mg via INTRAVENOUS
  Filled 2018-11-14: qty 15

## 2018-11-14 MED ORDER — ACETAMINOPHEN 325 MG PO TABS
650.0000 mg | ORAL_TABLET | ORAL | Status: DC
  Administered 2018-11-14: 650 mg via ORAL
  Filled 2018-11-14: qty 2

## 2018-11-14 MED ORDER — EPINEPHRINE 0.3 MG/0.3ML IJ SOAJ
0.3000 mg | Freq: Once | INTRAMUSCULAR | Status: DC
  Filled 2018-11-14: qty 0.3

## 2018-11-14 MED ORDER — SODIUM CHLORIDE 0.9 % IV SOLN
INTRAVENOUS | Status: DC
  Administered 2018-11-14: 14:00:00 via INTRAVENOUS

## 2018-11-14 MED ORDER — LORATADINE 10 MG PO TABS
10.0000 mg | ORAL_TABLET | ORAL | Status: DC
  Administered 2018-11-14: 10 mg via ORAL
  Filled 2018-11-14: qty 1

## 2018-11-14 NOTE — Progress Notes (Addendum)
Patient received Tysabri via a PIV as ordered. Observed for at least 60 minutes post infusion.Tolerated well, vitals stable, discharge instructions given, verbalized understanding. Patient alert, oriented and transported in a wheel chair at the time of discharge.   Scheduler will follow up with guardian to confirm next appointment.

## 2018-11-14 NOTE — Discharge Instructions (Signed)
Natalizumab injection What is this medicine? NATALIZUMAB (na ta LIZ you mab) is used to treat relapsing multiple sclerosis. This drug is not a cure. It is also used to treat Crohn's disease. This medicine may be used for other purposes; ask your health care provider or pharmacist if you have questions. COMMON BRAND NAME(S): Tysabri What should I tell my health care provider before I take this medicine? They need to know if you have any of these conditions: -immune system problems -progressive multifocal leukoencephalopathy (PML) -an unusual or allergic reaction to natalizumab, other medicines, foods, dyes, or preservatives -pregnant or trying to get pregnant -breast-feeding How should I use this medicine? This medicine is for infusion into a vein. It is given by a health care professional in a hospital or clinic setting. A special MedGuide will be given to you by the pharmacist with each prescription and refill. Be sure to read this information carefully each time. Talk to your pediatrician regarding the use of this medicine in children. This medicine is not approved for use in children. Overdosage: If you think you have taken too much of this medicine contact a poison control center or emergency room at once. NOTE: This medicine is only for you. Do not share this medicine with others. What if I miss a dose? It is important not to miss your dose. Call your doctor or health care professional if you are unable to keep an appointment. What may interact with this medicine? -azathioprine -cyclosporine -interferon -6-mercaptopurine -methotrexate -steroid medicines like prednisone or cortisone -TNF-alpha inhibitors like adalimumab, etanercept, and infliximab -vaccines This list may not describe all possible interactions. Give your health care provider a list of all the medicines, herbs, non-prescription drugs, or dietary supplements you use. Also tell them if you smoke, drink alcohol, or use  illegal drugs. Some items may interact with your medicine. What should I watch for while using this medicine? Your condition will be monitored carefully while you are receiving this medicine. Visit your doctor for regular check ups. Tell your doctor or healthcare professional if your symptoms do not start to get better or if they get worse. Stay away from people who are sick. Call your doctor or health care professional for advice if you get a fever, chills or sore throat, or other symptoms of a cold or flu. Do not treat yourself. In some patients, this medicine may cause a serious brain infection that may cause death. If you have any problems seeing, thinking, speaking, walking, or standing, tell your doctor right away. If you cannot reach your doctor, get urgent medical care. What side effects may I notice from receiving this medicine? Side effects that you should report to your doctor or health care professional as soon as possible: -allergic reactions like skin rash, itching or hives, swelling of the face, lips, or tongue -breathing problems -changes in vision -chest pain -dark urine -depression, feelings of sadness -dizziness -general ill feeling or flu-like symptoms -irregular, missed, or painful menstrual periods -light-colored stools -loss of appetite, nausea -muscle weakness -problems with balance, talking, or walking -right upper belly pain -unusually weak or tired -yellowing of the eyes or skin Side effects that usually do not require medical attention (report to your doctor or health care professional if they continue or are bothersome): -aches, pains -headache -stomach upset -tiredness This list may not describe all possible side effects. Call your doctor for medical advice about side effects. You may report side effects to FDA at 1-800-FDA-1088. Where should I keep   my medicine? This drug is given in a hospital or clinic and will not be stored at home. NOTE: This sheet is  a summary. It may not cover all possible information. If you have questions about this medicine, talk to your doctor, pharmacist, or health care provider.  2019 Elsevier/Gold Standard (2008-08-30 13:33:21)  

## 2018-12-12 ENCOUNTER — Encounter (HOSPITAL_COMMUNITY): Payer: Medicaid Other

## 2018-12-12 ENCOUNTER — Other Ambulatory Visit: Payer: Self-pay

## 2018-12-12 ENCOUNTER — Ambulatory Visit (HOSPITAL_COMMUNITY)
Admission: RE | Admit: 2018-12-12 | Discharge: 2018-12-12 | Disposition: A | Discharge status: Home / Self Care | Payer: Medicaid Other | Source: Ambulatory Visit | Attending: Neurology | Admitting: Neurology

## 2018-12-12 DIAGNOSIS — G35 Multiple sclerosis: Secondary | ICD-10-CM | POA: Insufficient documentation

## 2018-12-12 MED ORDER — SODIUM CHLORIDE 0.9 % IV SOLN
INTRAVENOUS | Status: DC | PRN
  Administered 2018-12-12: 250 mL via INTRAVENOUS

## 2018-12-12 MED ORDER — ACETAMINOPHEN 325 MG PO TABS
650.0000 mg | ORAL_TABLET | Freq: Once | ORAL | Status: AC
  Administered 2018-12-12: 650 mg via ORAL
  Filled 2018-12-12: qty 2

## 2018-12-12 MED ORDER — SODIUM CHLORIDE 0.9 % IV SOLN
300.0000 mg | Freq: Once | INTRAVENOUS | Status: AC
  Administered 2018-12-12: 300 mg via INTRAVENOUS
  Filled 2018-12-12: qty 15

## 2018-12-12 MED ORDER — LORATADINE 10 MG PO TABS
10.0000 mg | ORAL_TABLET | Freq: Every day | ORAL | Status: DC
  Administered 2018-12-12: 10 mg via ORAL
  Filled 2018-12-12: qty 1

## 2018-12-12 MED ORDER — EPINEPHRINE 0.3 MG/0.3ML IJ SOAJ
0.3000 mg | Freq: Once | INTRAMUSCULAR | Status: DC
  Filled 2018-12-12: qty 0.3

## 2018-12-12 NOTE — Discharge Instructions (Signed)
Natalizumab injection What is this medicine? NATALIZUMAB (na ta LIZ you mab) is used to treat relapsing multiple sclerosis. This drug is not a cure. It is also used to treat Crohn's disease. This medicine may be used for other purposes; ask your health care provider or pharmacist if you have questions. COMMON BRAND NAME(S): Tysabri What should I tell my health care provider before I take this medicine? They need to know if you have any of these conditions: -immune system problems -progressive multifocal leukoencephalopathy (PML) -an unusual or allergic reaction to natalizumab, other medicines, foods, dyes, or preservatives -pregnant or trying to get pregnant -breast-feeding How should I use this medicine? This medicine is for infusion into a vein. It is given by a health care professional in a hospital or clinic setting. A special MedGuide will be given to you by the pharmacist with each prescription and refill. Be sure to read this information carefully each time. Talk to your pediatrician regarding the use of this medicine in children. This medicine is not approved for use in children. Overdosage: If you think you have taken too much of this medicine contact a poison control center or emergency room at once. NOTE: This medicine is only for you. Do not share this medicine with others. What if I miss a dose? It is important not to miss your dose. Call your doctor or health care professional if you are unable to keep an appointment. What may interact with this medicine? -azathioprine -cyclosporine -interferon -6-mercaptopurine -methotrexate -steroid medicines like prednisone or cortisone -TNF-alpha inhibitors like adalimumab, etanercept, and infliximab -vaccines This list may not describe all possible interactions. Give your health care provider a list of all the medicines, herbs, non-prescription drugs, or dietary supplements you use. Also tell them if you smoke, drink alcohol, or use  illegal drugs. Some items may interact with your medicine. What should I watch for while using this medicine? Your condition will be monitored carefully while you are receiving this medicine. Visit your doctor for regular check ups. Tell your doctor or healthcare professional if your symptoms do not start to get better or if they get worse. Stay away from people who are sick. Call your doctor or health care professional for advice if you get a fever, chills or sore throat, or other symptoms of a cold or flu. Do not treat yourself. In some patients, this medicine may cause a serious brain infection that may cause death. If you have any problems seeing, thinking, speaking, walking, or standing, tell your doctor right away. If you cannot reach your doctor, get urgent medical care. What side effects may I notice from receiving this medicine? Side effects that you should report to your doctor or health care professional as soon as possible: -allergic reactions like skin rash, itching or hives, swelling of the face, lips, or tongue -breathing problems -changes in vision -chest pain -dark urine -depression, feelings of sadness -dizziness -general ill feeling or flu-like symptoms -irregular, missed, or painful menstrual periods -light-colored stools -loss of appetite, nausea -muscle weakness -problems with balance, talking, or walking -right upper belly pain -unusually weak or tired -yellowing of the eyes or skin Side effects that usually do not require medical attention (report to your doctor or health care professional if they continue or are bothersome): -aches, pains -headache -stomach upset -tiredness This list may not describe all possible side effects. Call your doctor for medical advice about side effects. You may report side effects to FDA at 1-800-FDA-1088. Where should I keep   my medicine? This drug is given in a hospital or clinic and will not be stored at home. NOTE: This sheet is  a summary. It may not cover all possible information. If you have questions about this medicine, talk to your doctor, pharmacist, or health care provider.  2019 Elsevier/Gold Standard (2008-08-30 13:33:21)  

## 2018-12-12 NOTE — Progress Notes (Signed)
Patient receivedTysabriviaaPIVas ordered. Observed for at least60minutes post infusion.Tolerated well, vitals stable, discharge instructions given, verbalized understanding. Patient alert, oriented and transported in a wheelchairat the time of discharge 

## 2018-12-21 ENCOUNTER — Telehealth: Payer: Self-pay | Admitting: Neurology

## 2018-12-21 NOTE — Telephone Encounter (Signed)
Will work on [place] on monday

## 2018-12-21 NOTE — Telephone Encounter (Signed)
Rachelle called from [employer] regarding an Authorization on the medication Tysabri. The Auth ends on 01/11/19. Please Call. Thanks

## 2019-01-03 NOTE — Telephone Encounter (Signed)
[employer] [phone], spoke with Rollene Fare. She is faxing form for recertification.  Form rcvd.

## 2019-01-09 ENCOUNTER — Other Ambulatory Visit: Payer: Self-pay

## 2019-01-09 ENCOUNTER — Encounter: Payer: Self-pay | Admitting: Neurology

## 2019-01-09 ENCOUNTER — Ambulatory Visit (HOSPITAL_COMMUNITY)
Admission: RE | Admit: 2019-01-09 | Discharge: 2019-01-09 | Disposition: A | Discharge status: Home / Self Care | Payer: Medicaid Other | Source: Ambulatory Visit | Attending: Neurology | Admitting: Neurology

## 2019-01-09 ENCOUNTER — Encounter (HOSPITAL_COMMUNITY): Payer: Medicaid Other

## 2019-01-09 DIAGNOSIS — G35 Multiple sclerosis: Secondary | ICD-10-CM | POA: Diagnosis present

## 2019-01-09 MED ORDER — SODIUM CHLORIDE 0.9 % IV SOLN
300.0000 mg | Freq: Once | INTRAVENOUS | Status: AC
  Administered 2019-01-09: 300 mg via INTRAVENOUS
  Filled 2019-01-09: qty 15

## 2019-01-09 MED ORDER — EPINEPHRINE 0.3 MG/0.3ML IJ SOAJ: 0.3000 mg | Freq: Once | INTRAMUSCULAR | Status: DC | PRN

## 2019-01-09 MED ORDER — ACETAMINOPHEN 325 MG PO TABS
650.0000 mg | ORAL_TABLET | Freq: Once | ORAL | Status: AC
  Administered 2019-01-09: 650 mg via ORAL
  Filled 2019-01-09: qty 2

## 2019-01-09 MED ORDER — LORATADINE 10 MG PO TABS
10.0000 mg | ORAL_TABLET | Freq: Every day | ORAL | Status: DC
  Administered 2019-01-09: 10 mg via ORAL
  Filled 2019-01-09: qty 1

## 2019-01-09 MED ORDER — SODIUM CHLORIDE 0.9 % IV SOLN
INTRAVENOUS | Status: DC | PRN
  Administered 2019-01-09: 250 mL via INTRAVENOUS

## 2019-01-09 NOTE — Psychosocial Assessment (Signed)
Patient receivedTysabriviaaPIVas ordered. Observed for at least87minutes post infusion.Tolerated well, vitals stable, discharge instructions given, verbalized understanding. Patient alert, oriented and transported in a wheelchairat the time of discharge

## 2019-01-09 NOTE — Discharge Instructions (Signed)
Natalizumab injection What is this medicine? NATALIZUMAB (na ta LIZ you mab) is used to treat relapsing multiple sclerosis. This drug is not a cure. It is also used to treat Crohn's disease. This medicine may be used for other purposes; ask your health care provider or pharmacist if you have questions. COMMON BRAND NAME(S): Tysabri What should I tell my health care provider before I take this medicine? They need to know if you have any of these conditions: -immune system problems -progressive multifocal leukoencephalopathy (PML) -an unusual or allergic reaction to natalizumab, other medicines, foods, dyes, or preservatives -pregnant or trying to get pregnant -breast-feeding How should I use this medicine? This medicine is for infusion into a vein. It is given by a health care professional in a hospital or clinic setting. A special MedGuide will be given to you by the pharmacist with each prescription and refill. Be sure to read this information carefully each time. Talk to your pediatrician regarding the use of this medicine in children. This medicine is not approved for use in children. Overdosage: If you think you have taken too much of this medicine contact a poison control center or emergency room at once. NOTE: This medicine is only for you. Do not share this medicine with others. What if I miss a dose? It is important not to miss your dose. Call your doctor or health care professional if you are unable to keep an appointment. What may interact with this medicine? -azathioprine -cyclosporine -interferon -6-mercaptopurine -methotrexate -steroid medicines like prednisone or cortisone -TNF-alpha inhibitors like adalimumab, etanercept, and infliximab -vaccines This list may not describe all possible interactions. Give your health care provider a list of all the medicines, herbs, non-prescription drugs, or dietary supplements you use. Also tell them if you smoke, drink alcohol, or use  illegal drugs. Some items may interact with your medicine. What should I watch for while using this medicine? Your condition will be monitored carefully while you are receiving this medicine. Visit your doctor for regular check ups. Tell your doctor or healthcare professional if your symptoms do not start to get better or if they get worse. Stay away from people who are sick. Call your doctor or health care professional for advice if you get a fever, chills or sore throat, or other symptoms of a cold or flu. Do not treat yourself. In some patients, this medicine may cause a serious brain infection that may cause death. If you have any problems seeing, thinking, speaking, walking, or standing, tell your doctor right away. If you cannot reach your doctor, get urgent medical care. What side effects may I notice from receiving this medicine? Side effects that you should report to your doctor or health care professional as soon as possible: -allergic reactions like skin rash, itching or hives, swelling of the face, lips, or tongue -breathing problems -changes in vision -chest pain -dark urine -depression, feelings of sadness -dizziness -general ill feeling or flu-like symptoms -irregular, missed, or painful menstrual periods -light-colored stools -loss of appetite, nausea -muscle weakness -problems with balance, talking, or walking -right upper belly pain -unusually weak or tired -yellowing of the eyes or skin Side effects that usually do not require medical attention (report to your doctor or health care professional if they continue or are bothersome): -aches, pains -headache -stomach upset -tiredness This list may not describe all possible side effects. Call your doctor for medical advice about side effects. You may report side effects to FDA at 1-800-FDA-1088. Where should I keep   my medicine? This drug is given in a hospital or clinic and will not be stored at home. NOTE: This sheet is  a summary. It may not cover all possible information. If you have questions about this medicine, talk to your doctor, pharmacist, or health care provider.  2019 Elsevier/Gold Standard (2008-08-30 13:33:21)  
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
## 2019-01-11 ENCOUNTER — Other Ambulatory Visit: Payer: Self-pay

## 2019-01-11 ENCOUNTER — Encounter: Payer: Self-pay | Admitting: Neurology

## 2019-01-11 ENCOUNTER — Telehealth (INDEPENDENT_AMBULATORY_CARE_PROVIDER_SITE_OTHER): Payer: Medicaid Other | Admitting: Neurology

## 2019-01-11 DIAGNOSIS — G35 Multiple sclerosis: Secondary | ICD-10-CM

## 2019-01-11 DIAGNOSIS — G40909 Epilepsy, unspecified, not intractable, without status epilepticus: Secondary | ICD-10-CM

## 2019-02-08 ENCOUNTER — Ambulatory Visit (HOSPITAL_COMMUNITY)
Admission: RE | Admit: 2019-02-08 | Discharge: 2019-02-08 | Disposition: A | Discharge status: Home / Self Care | Payer: Medicaid Other | Source: Ambulatory Visit | Attending: Neurology | Admitting: Neurology

## 2019-02-08 ENCOUNTER — Other Ambulatory Visit: Payer: Self-pay

## 2019-02-08 DIAGNOSIS — G35 Multiple sclerosis: Secondary | ICD-10-CM | POA: Diagnosis not present

## 2019-02-08 MED ORDER — SODIUM CHLORIDE 0.9 % IV SOLN
300.0000 mg | Freq: Once | INTRAVENOUS | Status: AC
  Administered 2019-02-08: 300 mg via INTRAVENOUS
  Filled 2019-02-08: qty 15

## 2019-02-08 MED ORDER — ACETAMINOPHEN 325 MG PO TABS
650.0000 mg | ORAL_TABLET | Freq: Once | ORAL | Status: AC
  Administered 2019-02-08: 650 mg via ORAL
  Filled 2019-02-08: qty 2

## 2019-02-08 MED ORDER — LORATADINE 10 MG PO TABS
10.0000 mg | ORAL_TABLET | Freq: Every day | ORAL | Status: DC
  Administered 2019-02-08: 10 mg via ORAL
  Filled 2019-02-08: qty 1

## 2019-02-08 MED ORDER — SODIUM CHLORIDE 0.9 % IV SOLN
INTRAVENOUS | Status: DC | PRN
  Administered 2019-02-08: 250 mL via INTRAVENOUS

## 2019-02-08 MED ORDER — EPINEPHRINE 0.3 MG/0.3ML IJ SOAJ
0.3000 mg | Freq: Once | INTRAMUSCULAR | Status: DC | PRN
  Filled 2019-02-08: qty 0.6

## 2019-02-08 NOTE — Discharge Instructions (Signed)
Natalizumab injection What is this medicine? NATALIZUMAB (na ta LIZ you mab) is used to treat relapsing multiple sclerosis. This drug is not a cure. It is also used to treat Crohn's disease. This medicine may be used for other purposes; ask your health care provider or pharmacist if you have questions. COMMON BRAND NAME(S): Tysabri What should I tell my health care provider before I take this medicine? They need to know if you have any of these conditions:  immune system problems  progressive multifocal leukoencephalopathy (PML)  an unusual or allergic reaction to natalizumab, other medicines, foods, dyes, or preservatives  pregnant or trying to get pregnant  breast-feeding How should I use this medicine? This medicine is for infusion into a vein. It is given by a health care professional in a hospital or clinic setting. A special MedGuide will be given to you by the pharmacist with each prescription and refill. Be sure to read this information carefully each time. Talk to your pediatrician regarding the use of this medicine in children. This medicine is not approved for use in children. Overdosage: If you think you have taken too much of this medicine contact a poison control center or emergency room at once. NOTE: This medicine is only for you. Do not share this medicine with others. What if I miss a dose? It is important not to miss your dose. Call your doctor or health care professional if you are unable to keep an appointment. What may interact with this medicine?  azathioprine  cyclosporine  interferon  6-mercaptopurine  methotrexate  steroid medicines like prednisone or cortisone  TNF-alpha inhibitors like adalimumab, etanercept, and infliximab  vaccines This list may not describe all possible interactions. Give your health care provider a list of all the medicines, herbs, non-prescription drugs, or dietary supplements you use. Also tell them if you smoke, drink  alcohol, or use illegal drugs. Some items may interact with your medicine. What should I watch for while using this medicine? Your condition will be monitored carefully while you are receiving this medicine. Visit your doctor for regular check ups. Tell your doctor or healthcare professional if your symptoms do not start to get better or if they get worse. Stay away from people who are sick. Call your doctor or health care professional for advice if you get a fever, chills or sore throat, or other symptoms of a cold or flu. Do not treat yourself. In some patients, this medicine may cause a serious brain infection that may cause death. If you have any problems seeing, thinking, speaking, walking, or standing, tell your doctor right away. If you cannot reach your doctor, get urgent medical care. What side effects may I notice from receiving this medicine? Side effects that you should report to your doctor or health care professional as soon as possible:  allergic reactions like skin rash, itching or hives, swelling of the face, lips, or tongue  breathing problems  changes in vision  chest pain  dark urine  depression, feelings of sadness  dizziness  general ill feeling or flu-like symptoms  irregular, missed, or painful menstrual periods  light-colored stools  loss of appetite, nausea  muscle weakness  problems with balance, talking, or walking  right upper belly pain  unusually weak or tired  yellowing of the eyes or skin Side effects that usually do not require medical attention (report to your doctor or health care professional if they continue or are bothersome):  aches, pains  headache  stomach upset    tiredness This list may not describe all possible side effects. Call your doctor for medical advice about side effects. You may report side effects to FDA at 1-800-FDA-1088. Where should I keep my medicine? This drug is given in a hospital or clinic and will not be  stored at home. NOTE: This sheet is a summary. It may not cover all possible information. If you have questions about this medicine, talk to your doctor, pharmacist, or health care provider.  2020 Elsevier/Gold Standard (2008-08-30 13:33:21)  

## 2019-02-08 NOTE — Progress Notes (Signed)
Patient received Tysabri via PIV. Pre infusion medications, Tylenol and Claritin were given. Patient did not stay for one hour post infusion. Tolerated well, vitals stable, discharge instructions given, verbalized understanding. Patient alert, oriented and transported in a wheel chair at the time of discharge.

## 2019-03-08 ENCOUNTER — Ambulatory Visit (HOSPITAL_COMMUNITY): Payer: Medicaid Other

## 2019-03-11 ENCOUNTER — Ambulatory Visit (HOSPITAL_COMMUNITY)
Admission: RE | Admit: 2019-03-11 | Discharge: 2019-03-11 | Disposition: A | Discharge status: Home / Self Care | Payer: Medicaid Other | Source: Ambulatory Visit | Attending: Neurology | Admitting: Neurology

## 2019-03-11 DIAGNOSIS — G35 Multiple sclerosis: Secondary | ICD-10-CM | POA: Diagnosis present

## 2019-03-11 MED ORDER — LORATADINE 10 MG PO TABS
10.0000 mg | ORAL_TABLET | Freq: Every day | ORAL | Status: DC
  Administered 2019-03-11: 10:00:00 10 mg via ORAL
  Filled 2019-03-11: qty 1

## 2019-03-11 MED ORDER — SODIUM CHLORIDE 0.9 % IV SOLN
INTRAVENOUS | Status: DC | PRN
  Administered 2019-03-11: 10:00:00 250 mL via INTRAVENOUS

## 2019-03-11 MED ORDER — SODIUM CHLORIDE 0.9 % IV SOLN
300.0000 mg | Freq: Once | INTRAVENOUS | Status: AC
  Administered 2019-03-11: 300 mg via INTRAVENOUS
  Filled 2019-03-11: qty 15

## 2019-03-11 MED ORDER — ACETAMINOPHEN 325 MG PO TABS
650.0000 mg | ORAL_TABLET | Freq: Once | ORAL | Status: AC
  Administered 2019-03-11: 650 mg via ORAL
  Filled 2019-03-11: qty 2

## 2019-03-11 NOTE — Progress Notes (Signed)
PATIENT CARE CENTER NOTE  Diagnosis:Multiple Sclorsis   Provider:Jaffe, Adam, DO   Procedure:Tysabri infusion   Note:Patient received Tysabri infusion via PIV. Pre-infusion medications given. Patient tolerated infusion well with no adverse reaction. Vital signs remained stable. Patient observed for 1 hour post-infusion. Discharge instructions given. Patient alert, oriented and ambulatoryto wheelchairat discharge.           

## 2019-03-11 NOTE — Discharge Instructions (Signed)
Natalizumab injection What is this medicine? NATALIZUMAB (na ta LIZ you mab) is used to treat relapsing multiple sclerosis. This drug is not a cure. It is also used to treat Crohn's disease. This medicine may be used for other purposes; ask your health care provider or pharmacist if you have questions. COMMON BRAND NAME(S): Tysabri What should I tell my health care provider before I take this medicine? They need to know if you have any of these conditions:  immune system problems  progressive multifocal leukoencephalopathy (PML)  an unusual or allergic reaction to natalizumab, other medicines, foods, dyes, or preservatives  pregnant or trying to get pregnant  breast-feeding How should I use this medicine? This medicine is for infusion into a vein. It is given by a health care professional in a hospital or clinic setting. A special MedGuide will be given to you by the pharmacist with each prescription and refill. Be sure to read this information carefully each time. Talk to your pediatrician regarding the use of this medicine in children. This medicine is not approved for use in children. Overdosage: If you think you have taken too much of this medicine contact a poison control center or emergency room at once. NOTE: This medicine is only for you. Do not share this medicine with others. What if I miss a dose? It is important not to miss your dose. Call your doctor or health care professional if you are unable to keep an appointment. What may interact with this medicine?  azathioprine  cyclosporine  interferon  6-mercaptopurine  methotrexate  steroid medicines like prednisone or cortisone  TNF-alpha inhibitors like adalimumab, etanercept, and infliximab  vaccines This list may not describe all possible interactions. Give your health care provider a list of all the medicines, herbs, non-prescription drugs, or dietary supplements you use. Also tell them if you smoke, drink  alcohol, or use illegal drugs. Some items may interact with your medicine. What should I watch for while using this medicine? Your condition will be monitored carefully while you are receiving this medicine. Visit your doctor for regular check ups. Tell your doctor or healthcare professional if your symptoms do not start to get better or if they get worse. Stay away from people who are sick. Call your doctor or health care professional for advice if you get a fever, chills or sore throat, or other symptoms of a cold or flu. Do not treat yourself. In some patients, this medicine may cause a serious brain infection that may cause death. If you have any problems seeing, thinking, speaking, walking, or standing, tell your doctor right away. If you cannot reach your doctor, get urgent medical care. What side effects may I notice from receiving this medicine? Side effects that you should report to your doctor or health care professional as soon as possible:  allergic reactions like skin rash, itching or hives, swelling of the face, lips, or tongue  breathing problems  changes in vision  chest pain  dark urine  depression, feelings of sadness  dizziness  general ill feeling or flu-like symptoms  irregular, missed, or painful menstrual periods  light-colored stools  loss of appetite, nausea  muscle weakness  problems with balance, talking, or walking  right upper belly pain  unusually weak or tired  yellowing of the eyes or skin Side effects that usually do not require medical attention (report to your doctor or health care professional if they continue or are bothersome):  aches, pains  headache  stomach upset    tiredness This list may not describe all possible side effects. Call your doctor for medical advice about side effects. You may report side effects to FDA at 1-800-FDA-1088. Where should I keep my medicine? This drug is given in a hospital or clinic and will not be  stored at home. NOTE: This sheet is a summary. It may not cover all possible information. If you have questions about this medicine, talk to your doctor, pharmacist, or health care provider.  2020 Elsevier/Gold Standard (2008-08-30 13:33:21)  

## 2019-03-23 ENCOUNTER — Ambulatory Visit
Admission: RE | Admit: 2019-03-23 | Discharge: 2019-03-23 | Disposition: A | Discharge status: Home / Self Care | Payer: Medicaid Other | Source: Ambulatory Visit | Attending: Neurology | Admitting: Neurology

## 2019-03-23 ENCOUNTER — Other Ambulatory Visit: Payer: Self-pay

## 2019-03-23 DIAGNOSIS — G35 Multiple sclerosis: Secondary | ICD-10-CM

## 2019-03-23 DIAGNOSIS — S069X4D Unspecified intracranial injury with loss of consciousness of 6 hours to 24 hours, subsequent encounter: Secondary | ICD-10-CM

## 2019-03-23 MED ORDER — GADOBENATE DIMEGLUMINE 529 MG/ML IV SOLN
20.0000 mL | Freq: Once | INTRAVENOUS | Status: AC | PRN
  Administered 2019-03-23: 13:00:00 20 mL via INTRAVENOUS

## 2019-03-27 ENCOUNTER — Telehealth: Payer: Self-pay

## 2019-03-27 NOTE — Telephone Encounter (Signed)
Left message for patient to call office.  

## 2019-03-27 NOTE — Telephone Encounter (Signed)
-----   Message from Pieter Partridge, DO sent at 03/26/2019  4:04 PM EDT ----- MRI shows slight progression of MS, all chronic, compared to MRI from 2017.  I wouldn't make any changes in management at this time but we must repeat MRI in one year.

## 2019-03-28 ENCOUNTER — Telehealth: Payer: Self-pay

## 2019-03-28 NOTE — Telephone Encounter (Signed)
Left message for patient to call in regarding results

## 2019-03-28 NOTE — Telephone Encounter (Signed)
-----   Message from Adam R Jaffe, DO sent at 03/26/2019  4:04 PM EDT ----- MRI shows slight progression of MS, all chronic, compared to MRI from 2017.  I wouldn't make any changes in management at this time but we must repeat MRI in one year. 

## 2019-04-08 ENCOUNTER — Telehealth: Payer: Self-pay | Admitting: Neurology

## 2019-04-08 DIAGNOSIS — G8324 Monoplegia of upper limb affecting left nondominant side: Secondary | ICD-10-CM

## 2019-04-08 DIAGNOSIS — S069X4D Unspecified intracranial injury with loss of consciousness of 6 hours to 24 hours, subsequent encounter: Secondary | ICD-10-CM

## 2019-04-08 DIAGNOSIS — Z79899 Other long term (current) drug therapy: Secondary | ICD-10-CM

## 2019-04-08 DIAGNOSIS — R4189 Other symptoms and signs involving cognitive functions and awareness: Secondary | ICD-10-CM

## 2019-04-08 DIAGNOSIS — G40909 Epilepsy, unspecified, not intractable, without status epilepticus: Secondary | ICD-10-CM

## 2019-04-08 DIAGNOSIS — G35 Multiple sclerosis: Secondary | ICD-10-CM

## 2019-04-08 NOTE — Telephone Encounter (Signed)
They would like the MRI results and the next appt date for the infusion appt

## 2019-04-09 NOTE — Telephone Encounter (Signed)
I do not see any more infusions do you want more infusions scheduled

## 2019-04-09 NOTE — Telephone Encounter (Signed)
Please refer to my result note regarding MRI from 8/29.   He is supposed to be getting Tysabri every month.

## 2019-04-10 NOTE — Telephone Encounter (Signed)
Please review this I am not sure

## 2019-04-10 NOTE — Telephone Encounter (Signed)
Called patient's aunt and told her I am working on ordering/scheduling next Tysabri infusion. Last one was given 8/17 at [facility].

## 2019-04-12 NOTE — Addendum Note (Signed)
Addended by: Jesse Fall on: 04/12/2019 09:24 AM   Modules accepted: Orders

## 2019-04-12 NOTE — Telephone Encounter (Addendum)
Spoke to Dr. Tomi Likens and patient needs to have cbc diff/ cmp / jc virus antibody with index drawn now prior to next infusion. Orders entered.  Called and spoke to aunt and she will bring him up here for lab work.  Once blood work resulted can have Tysabri infusion after JCV virus result is back and MD has reviewed.   Informed aunt will make infusion appt out a couple of weeks to make sure labs are resulted. Verbalized understanding.  Also informed aunt that MD wants Victor [name] to start on D3 4000 IU daily  Aunt has been informed (previously) regarding MRI results - she stated she was aware.

## 2019-04-15 ENCOUNTER — Other Ambulatory Visit: Payer: Self-pay

## 2019-04-15 ENCOUNTER — Other Ambulatory Visit (INDEPENDENT_AMBULATORY_CARE_PROVIDER_SITE_OTHER): Payer: Medicaid Other

## 2019-04-15 DIAGNOSIS — G35 Multiple sclerosis: Secondary | ICD-10-CM | POA: Diagnosis not present

## 2019-04-15 DIAGNOSIS — R4189 Other symptoms and signs involving cognitive functions and awareness: Secondary | ICD-10-CM

## 2019-04-15 DIAGNOSIS — G40909 Epilepsy, unspecified, not intractable, without status epilepticus: Secondary | ICD-10-CM

## 2019-04-15 DIAGNOSIS — S069X4D Unspecified intracranial injury with loss of consciousness of 6 hours to 24 hours, subsequent encounter: Secondary | ICD-10-CM

## 2019-04-15 DIAGNOSIS — Z79899 Other long term (current) drug therapy: Secondary | ICD-10-CM

## 2019-04-15 DIAGNOSIS — G8324 Monoplegia of upper limb affecting left nondominant side: Secondary | ICD-10-CM

## 2019-04-15 LAB — COMPREHENSIVE METABOLIC PANEL
ALT: 25 U/L (ref 0–53)
AST: 22 U/L (ref 0–37)
Albumin: 4.4 g/dL (ref 3.5–5.2)
Alkaline Phosphatase: 57 U/L (ref 39–117)
BUN: 11 mg/dL (ref 6–23)
CO2: 33 mEq/L — ABNORMAL HIGH (ref 19–32)
Calcium: 9.6 mg/dL (ref 8.4–10.5)
Chloride: 104 mEq/L (ref 96–112)
Creatinine, Ser: 1.05 mg/dL (ref 0.40–1.50)
GFR: 102.14 mL/min (ref >60.00)
Glucose, Bld: 85 mg/dL (ref 70–99)
Potassium: 4 mEq/L (ref 3.5–5.1)
Sodium: 143 mEq/L (ref 135–145)
Total Bilirubin: 0.4 mg/dL (ref 0.2–1.2)
Total Protein: 7.2 g/dL (ref 6.0–8.3)

## 2019-04-16 ENCOUNTER — Telehealth: Payer: Self-pay | Admitting: *Deleted

## 2019-04-16 ENCOUNTER — Other Ambulatory Visit: Payer: Self-pay | Admitting: *Deleted

## 2019-04-16 LAB — CBC WITH DIFFERENTIAL
Basophils Absolute: 0.1 10*3/uL (ref 0.0–0.2)
Basos: 1 %
EOS (ABSOLUTE): 0.8 10*3/uL — ABNORMAL HIGH (ref 0.0–0.4)
Eos: 8 %
Hematocrit: 43.7 % (ref 37.5–51.0)
Hemoglobin: 14.4 g/dL (ref 13.0–17.7)
Immature Grans (Abs): 0.2 10*3/uL — ABNORMAL HIGH (ref 0.0–0.1)
Immature Granulocytes: 2 %
Lymphocytes Absolute: 4.7 10*3/uL — ABNORMAL HIGH (ref 0.7–3.1)
Lymphs: 46 %
MCH: 25.8 pg — ABNORMAL LOW (ref 26.6–33.0)
MCHC: 33 g/dL (ref 31.5–35.7)
MCV: 78 fL — ABNORMAL LOW (ref 79–97)
Monocytes Absolute: 0.8 10*3/uL (ref 0.1–0.9)
Monocytes: 8 %
NRBC: 1 % — ABNORMAL HIGH (ref 0–0)
Neutrophils Absolute: 3.5 10*3/uL (ref 1.4–7.0)
Neutrophils: 35 %
RBC: 5.59 x10E6/uL (ref 4.14–5.80)
RDW: 13.3 % (ref 11.6–15.4)
WBC: 10.1 10*3/uL (ref 3.4–10.8)

## 2019-04-16 NOTE — Telephone Encounter (Signed)
Patient had labs drawn yesterday. Once JCV virus lab is resulted and reviewed by MD may have next Tysabri infusion. This infusion is to be given every 28 days.   Call placed to patient care center to make appt for patient. He received it there 03/11/19.  Will enter Tysabri for 3 months as new order needs to be placed every 90 days per JCAHO. Orders entered under signed / held for Pre meds of Claritin/Tylenol as well as the Tysabri infusion. Emergency meds of epi pen/solumedrol if reaction entered also.  Aunt Jeri Lager called and message left to make aware of next three appts scheduled for Tysabri . Scheduled with [facility] at [facility] [phone]) for the next  3 infusions:  Sept 30th 0800 Oct 29th 0800 Nov 30th 0800.  Left message for aunt to return call and I will inform her of above appts.

## 2019-04-17 ENCOUNTER — Telehealth: Payer: Self-pay | Admitting: *Deleted

## 2019-04-17 NOTE — Telephone Encounter (Signed)
Called aunt Jeri Lager and told her the time/dates for the next 3 infusions of Tysabri at [name]. The first one will be next week 9/30 at 0800. Aunt is aware where to go as he was there for last infusion.   Orders in computer. JCV virus lab drawn and will be watching for result as needed prior to infusion Wed.

## 2019-04-20 LAB — STRATIFY JCV AB (W/ INDEX) W/ RFLX
Index Value: 0.3 — ABNORMAL HIGH
Stratify JCV (TM) Ab w/Reflex Inhibition: UNDETERMINED — AB

## 2019-04-20 LAB — RFLX STRATIFY JCV (TM) AB INHIBITION: JCV Antibody by Inhibition: NEGATIVE

## 2019-04-22 ENCOUNTER — Telehealth: Payer: Self-pay | Admitting: *Deleted

## 2019-04-22 ENCOUNTER — Other Ambulatory Visit: Payer: Self-pay | Admitting: *Deleted

## 2019-04-22 DIAGNOSIS — Z79899 Other long term (current) drug therapy: Secondary | ICD-10-CM

## 2019-04-22 DIAGNOSIS — S069X4D Unspecified intracranial injury with loss of consciousness of 6 hours to 24 hours, subsequent encounter: Secondary | ICD-10-CM

## 2019-04-22 DIAGNOSIS — G35 Multiple sclerosis: Secondary | ICD-10-CM

## 2019-04-22 NOTE — Telephone Encounter (Signed)
Reviewed labs.  OK to schedule.  Will need to repeat those labs in 6 months.

## 2019-04-22 NOTE — Progress Notes (Signed)
Labs entered for March 2021 (CBC diff/CMET/JCV virus)

## 2019-04-22 NOTE — Telephone Encounter (Addendum)
Labs ordered CBC diff/ CMET/ JCV virus for March 2021.   Patient scheduled at [name] this Wed then the next two months (every 28 days). Sent email to Victor [name], nurse manager at [facility] to inquire whether their nurses can "modify orders" to use them up to 3 times once entered by office (new ones needed every 90 days). Or if office needs to enter new infusion orders every month that is fine just wanted to be clear so patient has orders when there for infusion.

## 2019-04-22 NOTE — Telephone Encounter (Signed)
Labs drawn 9/21 resulted : CMP, CBC, JCV virus (negative).  Dr. Tomi Likens Mauri Reading scheduled for this week and were waiting on JCV result. Ok to proceed with infusion?

## 2019-04-24 ENCOUNTER — Ambulatory Visit (HOSPITAL_COMMUNITY)
Admission: RE | Admit: 2019-04-24 | Discharge: 2019-04-24 | Disposition: A | Discharge status: Home / Self Care | Payer: Medicaid Other | Source: Ambulatory Visit | Attending: Neurology | Admitting: Neurology

## 2019-04-24 ENCOUNTER — Other Ambulatory Visit: Payer: Self-pay

## 2019-04-24 DIAGNOSIS — G35 Multiple sclerosis: Secondary | ICD-10-CM | POA: Diagnosis present

## 2019-04-24 MED ORDER — EPINEPHRINE 0.3 MG/0.3ML IJ SOAJ
0.3000 mg | INTRAMUSCULAR | Status: DC | PRN
  Filled 2019-04-24 (×2): qty 0.6

## 2019-04-24 MED ORDER — SODIUM CHLORIDE 0.9 % IV SOLN
300.0000 mg | INTRAVENOUS | Status: DC
  Administered 2019-04-24: 09:00:00 300 mg via INTRAVENOUS
  Filled 2019-04-24: qty 15

## 2019-04-24 MED ORDER — ACETAMINOPHEN 500 MG PO TABS
1000.0000 mg | ORAL_TABLET | ORAL | Status: DC
  Administered 2019-04-24: 08:00:00 1000 mg via ORAL
  Filled 2019-04-24: qty 2

## 2019-04-24 MED ORDER — LORATADINE 10 MG PO TABS
10.0000 mg | ORAL_TABLET | ORAL | Status: DC
  Administered 2019-04-24: 10 mg via ORAL
  Filled 2019-04-24: qty 1

## 2019-04-24 MED ORDER — METHYLPREDNISOLONE SODIUM SUCC 500 MG IJ SOLR
500.0000 mg | INTRAMUSCULAR | Status: DC | PRN
  Filled 2019-04-24: qty 500

## 2019-04-24 MED ORDER — SODIUM CHLORIDE 0.9 % IV SOLN
INTRAVENOUS | Status: DC | PRN
  Administered 2019-04-24: 09:00:00 250 mL via INTRAVENOUS

## 2019-04-24 NOTE — Discharge Instructions (Signed)
Natalizumab injection What is this medicine? NATALIZUMAB (na ta LIZ you mab) is used to treat relapsing multiple sclerosis. This drug is not a cure. It is also used to treat Crohn's disease. This medicine may be used for other purposes; ask your health care provider or pharmacist if you have questions. COMMON BRAND NAME(S): Tysabri What should I tell my health care provider before I take this medicine? They need to know if you have any of these conditions:  immune system problems  progressive multifocal leukoencephalopathy (PML)  an unusual or allergic reaction to natalizumab, other medicines, foods, dyes, or preservatives  pregnant or trying to get pregnant  breast-feeding How should I use this medicine? This medicine is for infusion into a vein. It is given by a health care professional in a hospital or clinic setting. A special MedGuide will be given to you by the pharmacist with each prescription and refill. Be sure to read this information carefully each time. Talk to your pediatrician regarding the use of this medicine in children. This medicine is not approved for use in children. Overdosage: If you think you have taken too much of this medicine contact a poison control center or emergency room at once. NOTE: This medicine is only for you. Do not share this medicine with others. What if I miss a dose? It is important not to miss your dose. Call your doctor or health care professional if you are unable to keep an appointment. What may interact with this medicine?  azathioprine  cyclosporine  interferon  6-mercaptopurine  methotrexate  steroid medicines like prednisone or cortisone  TNF-alpha inhibitors like adalimumab, etanercept, and infliximab  vaccines This list may not describe all possible interactions. Give your health care provider a list of all the medicines, herbs, non-prescription drugs, or dietary supplements you use. Also tell them if you smoke, drink  alcohol, or use illegal drugs. Some items may interact with your medicine. What should I watch for while using this medicine? Your condition will be monitored carefully while you are receiving this medicine. Visit your doctor for regular check ups. Tell your doctor or healthcare professional if your symptoms do not start to get better or if they get worse. Stay away from people who are sick. Call your doctor or health care professional for advice if you get a fever, chills or sore throat, or other symptoms of a cold or flu. Do not treat yourself. In some patients, this medicine may cause a serious brain infection that may cause death. If you have any problems seeing, thinking, speaking, walking, or standing, tell your doctor right away. If you cannot reach your doctor, get urgent medical care. What side effects may I notice from receiving this medicine? Side effects that you should report to your doctor or health care professional as soon as possible:  allergic reactions like skin rash, itching or hives, swelling of the face, lips, or tongue  breathing problems  changes in vision  chest pain  dark urine  depression, feelings of sadness  dizziness  general ill feeling or flu-like symptoms  irregular, missed, or painful menstrual periods  light-colored stools  loss of appetite, nausea  muscle weakness  problems with balance, talking, or walking  right upper belly pain  unusually weak or tired  yellowing of the eyes or skin Side effects that usually do not require medical attention (report to your doctor or health care professional if they continue or are bothersome):  aches, pains  headache  stomach upset    tiredness This list may not describe all possible side effects. Call your doctor for medical advice about side effects. You may report side effects to FDA at 1-800-FDA-1088. Where should I keep my medicine? This drug is given in a hospital or clinic and will not be  stored at home. NOTE: This sheet is a summary. It may not cover all possible information. If you have questions about this medicine, talk to your doctor, pharmacist, or health care provider.  2020 Elsevier/Gold Standard (2008-08-30 13:33:21)  

## 2019-04-24 NOTE — Progress Notes (Signed)
Patient received Tysabri via PIV. Pre infusion medications were given as ordered. Observed for at least 60 minutes post infusion.Tolerated well, vitals stable, discharge instructions given, verbalized understanding. Patient alert, oriented and transported in a wheelchair at the time of discharge.

## 2019-05-22 ENCOUNTER — Other Ambulatory Visit: Payer: Self-pay | Admitting: *Deleted

## 2019-05-23 ENCOUNTER — Other Ambulatory Visit: Payer: Self-pay | Admitting: *Deleted

## 2019-05-23 ENCOUNTER — Ambulatory Visit (HOSPITAL_COMMUNITY)
Admission: RE | Admit: 2019-05-23 | Discharge: 2019-05-23 | Disposition: A | Discharge status: Home / Self Care | Payer: Medicaid Other | Source: Ambulatory Visit | Attending: Neurology | Admitting: Neurology

## 2019-05-23 ENCOUNTER — Other Ambulatory Visit: Payer: Self-pay

## 2019-05-23 ENCOUNTER — Telehealth: Payer: Self-pay | Admitting: *Deleted

## 2019-05-23 DIAGNOSIS — G35 Multiple sclerosis: Secondary | ICD-10-CM | POA: Insufficient documentation

## 2019-05-23 MED ORDER — SODIUM CHLORIDE 0.9 % IV SOLN
INTRAVENOUS | Status: DC | PRN
  Administered 2019-05-23: 250 mL via INTRAVENOUS

## 2019-05-23 MED ORDER — EPINEPHRINE 0.3 MG/0.3ML IJ SOAJ
0.3000 mg | INTRAMUSCULAR | Status: DC | PRN
  Filled 2019-05-23: qty 0.6

## 2019-05-23 MED ORDER — METHYLPREDNISOLONE SODIUM SUCC 500 MG IJ SOLR
500.0000 mg | INTRAMUSCULAR | Status: DC | PRN
  Filled 2019-05-23: qty 500

## 2019-05-23 MED ORDER — ACETAMINOPHEN 500 MG PO TABS
1000.0000 mg | ORAL_TABLET | Freq: Once | ORAL | Status: AC
  Administered 2019-05-23: 1000 mg via ORAL
  Filled 2019-05-23: qty 2

## 2019-05-23 MED ORDER — SODIUM CHLORIDE 0.9 % IV SOLN
300.0000 mg | Freq: Once | INTRAVENOUS | Status: AC
  Administered 2019-05-23: 09:00:00 300 mg via INTRAVENOUS
  Filled 2019-05-23: qty 15

## 2019-05-23 MED ORDER — LORATADINE 10 MG PO TABS
10.0000 mg | ORAL_TABLET | Freq: Once | ORAL | Status: AC
  Administered 2019-05-23: 09:00:00 10 mg via ORAL
  Filled 2019-05-23: qty 1

## 2019-05-23 NOTE — Telephone Encounter (Signed)
Patient had last appt televisit with Dr. Tomi Likens 01/11/19. He has not scheduled next appt so had front desk go ahead and put him on Dr. Georgie Chard schedule for his 6 mos follow up. Next available appt is Monday March 8 at 1050 am.  Patient will need to have the following labs done the week prior to his appt. :  CMP / CBC with diff / JCV virus antibody with index  Call left for aunt Eston Esters to let her know about [facility]/ Dr. Tomi Likens appt/ lab draw prior to March appt.  Asked her to please return call to office.

## 2019-05-23 NOTE — Telephone Encounter (Signed)
Opened in error

## 2019-05-23 NOTE — Progress Notes (Addendum)
[facility] NOTE  Diagnosis:Multiple Sclorsis   Provider:Jaffe, Adam, DO   Procedure:Tysabri infusion   Note:Patient received Tysabri infusion via PIV. Pre-infusion medications given. Patient tolerated infusion well with no adverse reaction. Vital signs remained stable. Patient observed for 1 hour post-infusion. Discharge instructions given. Patient alert, oriented and ambulatoryto wheelchairat discharge.           

## 2019-05-23 NOTE — Discharge Instructions (Signed)
Natalizumab injection What is this medicine? NATALIZUMAB (na ta LIZ you mab) is used to treat relapsing multiple sclerosis. This drug is not a cure. It is also used to treat Crohn's disease. This medicine may be used for other purposes; ask your health care provider or pharmacist if you have questions. COMMON BRAND NAME(S): Tysabri What should I tell my health care provider before I take this medicine? They need to know if you have any of these conditions:  immune system problems  progressive multifocal leukoencephalopathy (PML)  an unusual or allergic reaction to natalizumab, other medicines, foods, dyes, or preservatives  pregnant or trying to get pregnant  breast-feeding How should I use this medicine? This medicine is for infusion into a vein. It is given by a health care professional in a hospital or clinic setting. A special MedGuide will be given to you by the pharmacist with each prescription and refill. Be sure to read this information carefully each time. Talk to your pediatrician regarding the use of this medicine in children. This medicine is not approved for use in children. Overdosage: If you think you have taken too much of this medicine contact a poison control center or emergency room at once. NOTE: This medicine is only for you. Do not share this medicine with others. What if I miss a dose? It is important not to miss your dose. Call your doctor or health care professional if you are unable to keep an appointment. What may interact with this medicine?  azathioprine  cyclosporine  interferon  6-mercaptopurine  methotrexate  steroid medicines like prednisone or cortisone  TNF-alpha inhibitors like adalimumab, etanercept, and infliximab  vaccines This list may not describe all possible interactions. Give your health care provider a list of all the medicines, herbs, non-prescription drugs, or dietary supplements you use. Also tell them if you smoke, drink  alcohol, or use illegal drugs. Some items may interact with your medicine. What should I watch for while using this medicine? Your condition will be monitored carefully while you are receiving this medicine. Visit your doctor for regular check ups. Tell your doctor or healthcare professional if your symptoms do not start to get better or if they get worse. Stay away from people who are sick. Call your doctor or health care professional for advice if you get a fever, chills or sore throat, or other symptoms of a cold or flu. Do not treat yourself. In some patients, this medicine may cause a serious brain infection that may cause death. If you have any problems seeing, thinking, speaking, walking, or standing, tell your doctor right away. If you cannot reach your doctor, get urgent medical care. What side effects may I notice from receiving this medicine? Side effects that you should report to your doctor or health care professional as soon as possible:  allergic reactions like skin rash, itching or hives, swelling of the face, lips, or tongue  breathing problems  changes in vision  chest pain  dark urine  depression, feelings of sadness  dizziness  general ill feeling or flu-like symptoms  irregular, missed, or painful menstrual periods  light-colored stools  loss of appetite, nausea  muscle weakness  problems with balance, talking, or walking  right upper belly pain  unusually weak or tired  yellowing of the eyes or skin Side effects that usually do not require medical attention (report to your doctor or health care professional if they continue or are bothersome):  aches, pains  headache  stomach upset    tiredness This list may not describe all possible side effects. Call your doctor for medical advice about side effects. You may report side effects to FDA at 1-800-FDA-1088. Where should I keep my medicine? This drug is given in a hospital or clinic and will not be  stored at home. NOTE: This sheet is a summary. It may not cover all possible information. If you have questions about this medicine, talk to your doctor, pharmacist, or health care provider.  2020 Elsevier/Gold Standard (2008-08-30 13:33:21)  

## 2019-05-23 NOTE — Progress Notes (Signed)
Orders for tysabri  [facility]ly infusion at [facility]. appt are all scheduled through Jan 25  pre- med  tylenol 1000 mg PO Claritin 10 mg PO

## 2019-05-23 NOTE — Telephone Encounter (Signed)
Called and left message for Jurgen / his Aunt (Mrs. Rutherford Nail) to let them know I scheduled Brandis's next 3 Tysabri infusions at [facility] [phone]). He is currently there currently getting his monthly infusion.  Left message for aunt to call back to make sure she has the time/dates of future infusions.  Patient has appts scheduled for 11/20; 12/28; 1/25.  Per Dr. Tomi Likens - orders for premeds are Tylenol 1000mg  and Claritin 10mg  po prior to the Tysabri 300mg  given every 28 days.

## 2019-06-24 ENCOUNTER — Inpatient Hospital Stay (HOSPITAL_COMMUNITY)
Admission: RE | Admit: 2019-06-24 | Discharge: 2019-06-24 | Disposition: A | Discharge status: Home / Self Care | Payer: Medicaid Other | Source: Ambulatory Visit

## 2019-06-27 ENCOUNTER — Other Ambulatory Visit: Payer: Self-pay

## 2019-06-27 NOTE — Progress Notes (Signed)
Place hospital orders for [facility] for Tysabri 300 mg every 28 days  Pre- Meds are Tylenol 1000 mg once- Claritin 10 mg once   Check for PA is needed  Dx: G35 MS    appt schedule for 07/22/19 @ 9:00 am

## 2019-07-03 ENCOUNTER — Other Ambulatory Visit: Payer: Self-pay

## 2019-07-18 ENCOUNTER — Telehealth: Payer: Self-pay | Admitting: Neurology

## 2019-07-18 NOTE — Telephone Encounter (Signed)
Called [facility] back and pt is scheduled at [facility] long the 28th of dec.

## 2019-07-18 NOTE — Telephone Encounter (Signed)
Sharyn Lull called from [facility] needing to speak with you regarding this patient and if he is still under Dr. Georgie Chard care? His last Infusion was on 05/23/19 for Tysabri. They are also needing to know his Therapy status. They said they spoke with Mikle Bosworth his caretaker but due to [place] they could not ask her anything. She did say that he was under care of Dr. Nancy Fetter. Please Call. Thank you

## 2019-07-22 ENCOUNTER — Ambulatory Visit (HOSPITAL_COMMUNITY)
Admission: RE | Admit: 2019-07-22 | Discharge: 2019-07-22 | Disposition: A | Discharge status: Home / Self Care | Payer: Medicaid Other | Source: Ambulatory Visit | Attending: Neurology | Admitting: Neurology

## 2019-07-22 ENCOUNTER — Other Ambulatory Visit: Payer: Self-pay

## 2019-07-22 DIAGNOSIS — G35 Multiple sclerosis: Secondary | ICD-10-CM | POA: Diagnosis not present

## 2019-07-22 MED ORDER — SODIUM CHLORIDE 0.9 % IV SOLN
300.0000 mg | INTRAVENOUS | Status: DC
  Administered 2019-07-22: 300 mg via INTRAVENOUS
  Filled 2019-07-22: qty 15

## 2019-07-22 MED ORDER — ACETAMINOPHEN 500 MG PO TABS
1000.0000 mg | ORAL_TABLET | Freq: Once | ORAL | Status: AC
  Administered 2019-07-22: 1000 mg via ORAL
  Filled 2019-07-22: qty 2

## 2019-07-22 MED ORDER — SODIUM CHLORIDE 0.9 % IV SOLN
INTRAVENOUS | Status: DC | PRN
  Administered 2019-07-22: 250 mL via INTRAVENOUS

## 2019-07-22 MED ORDER — LORATADINE 10 MG PO TABS
10.0000 mg | ORAL_TABLET | Freq: Once | ORAL | Status: AC
  Administered 2019-07-22: 10 mg via ORAL
  Filled 2019-07-22: qty 1

## 2019-07-22 NOTE — Discharge Instructions (Signed)
Natalizumab injection What is this medicine? NATALIZUMAB (na ta LIZ you mab) is used to treat relapsing multiple sclerosis. This drug is not a cure. It is also used to treat Crohn's disease. This medicine may be used for other purposes; ask your health care provider or pharmacist if you have questions. COMMON BRAND NAME(S): Tysabri What should I tell my health care provider before I take this medicine? They need to know if you have any of these conditions:  immune system problems  progressive multifocal leukoencephalopathy (PML)  an unusual or allergic reaction to natalizumab, other medicines, foods, dyes, or preservatives  pregnant or trying to get pregnant  breast-feeding How should I use this medicine? This medicine is for infusion into a vein. It is given by a health care professional in a hospital or clinic setting. A special MedGuide will be given to you by the pharmacist with each prescription and refill. Be sure to read this information carefully each time. Talk to your pediatrician regarding the use of this medicine in children. This medicine is not approved for use in children. Overdosage: If you think you have taken too much of this medicine contact a poison control center or emergency room at once. NOTE: This medicine is only for you. Do not share this medicine with others. What if I miss a dose? It is important not to miss your dose. Call your doctor or health care professional if you are unable to keep an appointment. What may interact with this medicine?  azathioprine  cyclosporine  interferon  6-mercaptopurine  methotrexate  steroid medicines like prednisone or cortisone  TNF-alpha inhibitors like adalimumab, etanercept, and infliximab  vaccines This list may not describe all possible interactions. Give your health care provider a list of all the medicines, herbs, non-prescription drugs, or dietary supplements you use. Also tell them if you smoke, drink  alcohol, or use illegal drugs. Some items may interact with your medicine. What should I watch for while using this medicine? Your condition will be monitored carefully while you are receiving this medicine. Visit your doctor for regular check ups. Tell your doctor or healthcare professional if your symptoms do not start to get better or if they get worse. Stay away from people who are sick. Call your doctor or health care professional for advice if you get a fever, chills or sore throat, or other symptoms of a cold or flu. Do not treat yourself. In some patients, this medicine may cause a serious brain infection that may cause death. If you have any problems seeing, thinking, speaking, walking, or standing, tell your doctor right away. If you cannot reach your doctor, get urgent medical care. What side effects may I notice from receiving this medicine? Side effects that you should report to your doctor or health care professional as soon as possible:  allergic reactions like skin rash, itching or hives, swelling of the face, lips, or tongue  breathing problems  changes in vision  chest pain  dark urine  depression, feelings of sadness  dizziness  general ill feeling or flu-like symptoms  irregular, missed, or painful menstrual periods  light-colored stools  loss of appetite, nausea  muscle weakness  problems with balance, talking, or walking  right upper belly pain  unusually weak or tired  yellowing of the eyes or skin Side effects that usually do not require medical attention (report to your doctor or health care professional if they continue or are bothersome):  aches, pains  headache  stomach upset    tiredness This list may not describe all possible side effects. Call your doctor for medical advice about side effects. You may report side effects to FDA at 1-800-FDA-1088. Where should I keep my medicine? This drug is given in a hospital or clinic and will not be  stored at home. NOTE: This sheet is a summary. It may not cover all possible information. If you have questions about this medicine, talk to your doctor, pharmacist, or health care provider.  2020 Elsevier/Gold Standard (2008-08-30 13:33:21)  

## 2019-07-22 NOTE — Progress Notes (Signed)
PATIENT CARE CENTER NOTE  Diagnosis:Multiple Sclorsis   Provider:Jaffe, Adam, DO   Procedure:Tysabri infusion   Note:Patient received Tysabri infusion via PIV. Pre-infusion medications given. Patient tolerated infusion well with no adverse reaction. Vital signs remained stable. Patient observed for 1 hour post-infusion. Discharge instructions given. Patient alert, oriented and ambulatoryto wheelchairat discharge.           

## 2019-07-31 ENCOUNTER — Other Ambulatory Visit: Payer: Self-pay

## 2019-07-31 DIAGNOSIS — G35 Multiple sclerosis: Secondary | ICD-10-CM

## 2019-08-13 ENCOUNTER — Other Ambulatory Visit: Payer: Self-pay

## 2019-08-13 ENCOUNTER — Telehealth: Payer: Self-pay | Admitting: Neurology

## 2019-08-13 DIAGNOSIS — G35 Multiple sclerosis: Secondary | ICD-10-CM

## 2019-08-13 NOTE — Telephone Encounter (Signed)
Patient is needing orders for the wheelchair and walker please. Patient has medicaid. Thanks!

## 2019-08-13 NOTE — Telephone Encounter (Signed)
OK 

## 2019-08-13 NOTE — Telephone Encounter (Signed)
Mrs Dreama Saa called informed that script was sent to [facility] for wheelchair and walker for Mr Nakanishi,

## 2019-08-19 ENCOUNTER — Ambulatory Visit (HOSPITAL_COMMUNITY)
Admission: RE | Admit: 2019-08-19 | Discharge: 2019-08-19 | Disposition: A | Discharge status: Home / Self Care | Payer: Medicaid Other | Source: Ambulatory Visit | Attending: Neurology | Admitting: Neurology

## 2019-08-19 ENCOUNTER — Other Ambulatory Visit: Payer: Self-pay

## 2019-08-19 DIAGNOSIS — G35 Multiple sclerosis: Secondary | ICD-10-CM | POA: Diagnosis present

## 2019-08-19 MED ORDER — SODIUM CHLORIDE 0.9 % IV SOLN
INTRAVENOUS | Status: DC | PRN
  Administered 2019-08-19: 250 mL via INTRAVENOUS

## 2019-08-19 MED ORDER — ACETAMINOPHEN ER 650 MG PO TBCR: 650.0000 mg | EXTENDED_RELEASE_TABLET | Freq: Once | ORAL | Status: DC

## 2019-08-19 MED ORDER — ACETAMINOPHEN 325 MG PO TABS
650.0000 mg | ORAL_TABLET | Freq: Once | ORAL | Status: AC
  Administered 2019-08-19: 650 mg via ORAL
  Filled 2019-08-19: qty 2

## 2019-08-19 MED ORDER — SODIUM CHLORIDE 0.9 % IV SOLN
300.0000 mg | INTRAVENOUS | Status: DC
  Administered 2019-08-19: 300 mg via INTRAVENOUS
  Filled 2019-08-19: qty 15

## 2019-08-19 MED ORDER — LORATADINE 10 MG PO TABS
10.0000 mg | ORAL_TABLET | Freq: Once | ORAL | Status: AC
  Administered 2019-08-19: 10:00:00 10 mg via ORAL
  Filled 2019-08-19: qty 1

## 2019-08-19 NOTE — Progress Notes (Signed)
PATIENT CARE CENTER NOTE  Diagnosis:Multiple Sclorsis   Provider:Jaffe, Adam, DO   Procedure:Tysabri infusion   Note:Patient received Tysabri infusion via PIV. Pre-infusion medications given. Patient tolerated infusion well with no adverse reaction. Vital signs remained stable. Patient observed for 1 hour post-infusion. Discharge instructions given. Patient alert, oriented and ambulatoryto wheelchairat discharge.           

## 2019-08-19 NOTE — Discharge Instructions (Signed)
Natalizumab injection What is this medicine? NATALIZUMAB (na ta LIZ you mab) is used to treat relapsing multiple sclerosis. This drug is not a cure. It is also used to treat Crohn's disease. This medicine may be used for other purposes; ask your health care provider or pharmacist if you have questions. COMMON BRAND NAME(S): Tysabri What should I tell my health care provider before I take this medicine? They need to know if you have any of these conditions:  immune system problems  progressive multifocal leukoencephalopathy (PML)  an unusual or allergic reaction to natalizumab, other medicines, foods, dyes, or preservatives  pregnant or trying to get pregnant  breast-feeding How should I use this medicine? This medicine is for infusion into a vein. It is given by a health care professional in a hospital or clinic setting. A special MedGuide will be given to you by the pharmacist with each prescription and refill. Be sure to read this information carefully each time. Talk to your pediatrician regarding the use of this medicine in children. This medicine is not approved for use in children. Overdosage: If you think you have taken too much of this medicine contact a poison control center or emergency room at once. NOTE: This medicine is only for you. Do not share this medicine with others. What if I miss a dose? It is important not to miss your dose. Call your doctor or health care professional if you are unable to keep an appointment. What may interact with this medicine? Do not take this medicine with any of the following medications:  biologic medicines such as adalimumab, certolizumab, etanercept, golimumab, infliximab This medicine may also interact with the following medications:  azathioprine  cyclosporine  interferons  6-mercaptopurine  methotrexate  other medicines that lower your chance of fighting an infection  steroid medicines like prednisone or  cortisone  vaccines This list may not describe all possible interactions. Give your health care provider a list of all the medicines, herbs, non-prescription drugs, or dietary supplements you use. Also tell them if you smoke, drink alcohol, or use illegal drugs. Some items may interact with your medicine. What should I watch for while using this medicine? Your condition will be monitored carefully while you are receiving this medicine. Visit your doctor for regular check ups. Tell your doctor or healthcare professional if your symptoms do not start to get better or if they get worse. Stay away from people who are sick. Call your doctor or health care professional for advice if you get a fever, chills or sore throat, or other symptoms of a cold or flu. Do not treat yourself. In some patients, this medicine may cause a serious brain infection that may cause death. If you have any problems seeing, thinking, speaking, walking, or standing, tell your doctor right away. If you cannot reach your doctor, get urgent medical care. What side effects may I notice from receiving this medicine? Side effects that you should report to your doctor or health care professional as soon as possible:  allergic reactions like skin rash, itching or hives, swelling of the face, lips, or tongue  breathing problems  changes in vision  chest pain  confusion  depressed mood  dizziness  feeling faint; lightheaded; falls  general ill feeling or flu-like symptoms  loss of memory  missed menstrual periods  muscle weakness  problems with balance, talking, or walking  signs and symptoms of liver injury like dark yellow or brown urine; general ill feeling or flu-like symptoms; light-colored   stools; loss of appetite; nausea; right upper belly pain; unusually weak or tired; yellowing of the eyes or skin  suicidal thoughts, mood changes  unusual bruising or bleeding  unusually weak or tired Side effects that  usually do not require medical attention (report to your doctor or health care professional if they continue or are bothersome):  headache  joint pain  muscle cramps  muscle pain  nausea, vomiting  pain, redness, or irritation at site where injected  tiredness This list may not describe all possible side effects. Call your doctor for medical advice about side effects. You may report side effects to FDA at 1-800-FDA-1088. Where should I keep my medicine? This drug is given in a hospital or clinic and will not be stored at home. NOTE: This sheet is a summary. It may not cover all possible information. If you have questions about this medicine, talk to your doctor, pharmacist, or health care provider.  2020 Elsevier/Gold Standard (2019-01-14 13:20:26)  

## 2019-08-23 ENCOUNTER — Other Ambulatory Visit: Payer: Self-pay

## 2019-08-23 ENCOUNTER — Telehealth: Payer: Self-pay | Admitting: Neurology

## 2019-08-23 DIAGNOSIS — G35 Multiple sclerosis: Secondary | ICD-10-CM

## 2019-08-23 NOTE — Telephone Encounter (Signed)
Unable to get wheelchair due to insurance denial

## 2019-08-23 NOTE — Telephone Encounter (Signed)
Left message for patient to call with fax number caretaker to call back

## 2019-08-23 NOTE — Telephone Encounter (Signed)
Victor [name] called back and asked could the order for the Wheel chair be faxed to the same place as the walker was placed. Thank you

## 2019-08-23 NOTE — Telephone Encounter (Signed)
Victor [name] called regarding Victor [name] and needing a order for a Wheel chair re faxed for him. She said he was able to get a walker but the medical supply did not have the wheel chair. She would like to know if a new order can be sent there again and to another place as well. Thank you

## 2019-09-16 ENCOUNTER — Other Ambulatory Visit: Payer: Self-pay

## 2019-09-16 ENCOUNTER — Ambulatory Visit (HOSPITAL_COMMUNITY)
Admission: RE | Admit: 2019-09-16 | Discharge: 2019-09-16 | Disposition: A | Discharge status: Home / Self Care | Payer: Medicaid Other | Source: Ambulatory Visit | Attending: Neurology | Admitting: Neurology

## 2019-09-16 DIAGNOSIS — G35 Multiple sclerosis: Secondary | ICD-10-CM | POA: Insufficient documentation

## 2019-09-16 MED ORDER — EPINEPHRINE 0.3 MG/0.3ML IJ SOAJ
0.3000 mg | Freq: Once | INTRAMUSCULAR | Status: DC
  Filled 2019-09-16: qty 0.6

## 2019-09-16 MED ORDER — SODIUM CHLORIDE 0.9 % IV SOLN
300.0000 mg | Freq: Once | INTRAVENOUS | Status: AC
  Administered 2019-09-16: 09:00:00 300 mg via INTRAVENOUS
  Filled 2019-09-16: qty 15

## 2019-09-16 MED ORDER — LORATADINE 10 MG PO TABS
10.0000 mg | ORAL_TABLET | Freq: Every day | ORAL | Status: DC
  Administered 2019-09-16: 09:00:00 10 mg via ORAL
  Filled 2019-09-16: qty 1

## 2019-09-16 MED ORDER — ACETAMINOPHEN 325 MG PO TABS
650.0000 mg | ORAL_TABLET | Freq: Once | ORAL | Status: AC
  Administered 2019-09-16: 09:00:00 650 mg via ORAL
  Filled 2019-09-16: qty 2

## 2019-09-16 MED ORDER — SODIUM CHLORIDE 0.9 % IV SOLN
INTRAVENOUS | Status: DC | PRN
  Administered 2019-09-16: 09:00:00 250 mL via INTRAVENOUS

## 2019-09-16 NOTE — Progress Notes (Signed)
PATIENT CARE CENTER NOTE  Diagnosis:Multiple Sclorsis   Provider:Jaffe, Adam, DO   Procedure:Tysabri infusion   Note:Patient received Tysabri infusion via PIV. Pre-infusion medications given. Patient tolerated infusion well with no adverse reaction. Vital signs remained stable. Patient observed for 1 hour post-infusion. Discharge instructions given. Patient alert, oriented and ambulatoryto wheelchairat discharge.           

## 2019-09-16 NOTE — Discharge Instructions (Signed)
Natalizumab injection What is this medicine? NATALIZUMAB (na ta LIZ you mab) is used to treat relapsing multiple sclerosis. This drug is not a cure. It is also used to treat Crohn's disease. This medicine may be used for other purposes; ask your health care provider or pharmacist if you have questions. COMMON BRAND NAME(S): Tysabri What should I tell my health care provider before I take this medicine? They need to know if you have any of these conditions:  immune system problems  progressive multifocal leukoencephalopathy (PML)  an unusual or allergic reaction to natalizumab, other medicines, foods, dyes, or preservatives  pregnant or trying to get pregnant  breast-feeding How should I use this medicine? This medicine is for infusion into a vein. It is given by a health care professional in a hospital or clinic setting. A special MedGuide will be given to you by the pharmacist with each prescription and refill. Be sure to read this information carefully each time. Talk to your pediatrician regarding the use of this medicine in children. This medicine is not approved for use in children. Overdosage: If you think you have taken too much of this medicine contact a poison control center or emergency room at once. NOTE: This medicine is only for you. Do not share this medicine with others. What if I miss a dose? It is important not to miss your dose. Call your doctor or health care professional if you are unable to keep an appointment. What may interact with this medicine? Do not take this medicine with any of the following medications:  biologic medicines such as adalimumab, certolizumab, etanercept, golimumab, infliximab This medicine may also interact with the following medications:  azathioprine  cyclosporine  interferons  6-mercaptopurine  methotrexate  other medicines that lower your chance of fighting an infection  steroid medicines like prednisone or  cortisone  vaccines This list may not describe all possible interactions. Give your health care provider a list of all the medicines, herbs, non-prescription drugs, or dietary supplements you use. Also tell them if you smoke, drink alcohol, or use illegal drugs. Some items may interact with your medicine. What should I watch for while using this medicine? Your condition will be monitored carefully while you are receiving this medicine. Visit your doctor for regular check ups. Tell your doctor or healthcare professional if your symptoms do not start to get better or if they get worse. Stay away from people who are sick. Call your doctor or health care professional for advice if you get a fever, chills or sore throat, or other symptoms of a cold or flu. Do not treat yourself. In some patients, this medicine may cause a serious brain infection that may cause death. If you have any problems seeing, thinking, speaking, walking, or standing, tell your doctor right away. If you cannot reach your doctor, get urgent medical care. What side effects may I notice from receiving this medicine? Side effects that you should report to your doctor or health care professional as soon as possible:  allergic reactions like skin rash, itching or hives, swelling of the face, lips, or tongue  breathing problems  changes in vision  chest pain  confusion  depressed mood  dizziness  feeling faint; lightheaded; falls  general ill feeling or flu-like symptoms  loss of memory  missed menstrual periods  muscle weakness  problems with balance, talking, or walking  signs and symptoms of liver injury like dark yellow or brown urine; general ill feeling or flu-like symptoms; light-colored   stools; loss of appetite; nausea; right upper belly pain; unusually weak or tired; yellowing of the eyes or skin  suicidal thoughts, mood changes  unusual bruising or bleeding  unusually weak or tired Side effects that  usually do not require medical attention (report to your doctor or health care professional if they continue or are bothersome):  headache  joint pain  muscle cramps  muscle pain  nausea, vomiting  pain, redness, or irritation at site where injected  tiredness This list may not describe all possible side effects. Call your doctor for medical advice about side effects. You may report side effects to FDA at 1-800-FDA-1088. Where should I keep my medicine? This drug is given in a hospital or clinic and will not be stored at home. NOTE: This sheet is a summary. It may not cover all possible information. If you have questions about this medicine, talk to your doctor, pharmacist, or health care provider.  2020 Elsevier/Gold Standard (2019-01-14 13:20:26)  

## 2019-09-18 ENCOUNTER — Other Ambulatory Visit: Payer: Self-pay

## 2019-09-18 ENCOUNTER — Other Ambulatory Visit: Payer: Medicaid Other

## 2019-09-18 ENCOUNTER — Telehealth: Payer: Self-pay

## 2019-09-18 DIAGNOSIS — G35 Multiple sclerosis: Secondary | ICD-10-CM

## 2019-09-18 DIAGNOSIS — Z79899 Other long term (current) drug therapy: Secondary | ICD-10-CM

## 2019-09-18 DIAGNOSIS — S069X4D Unspecified intracranial injury with loss of consciousness of 6 hours to 24 hours, subsequent encounter: Secondary | ICD-10-CM

## 2019-09-18 LAB — COMPREHENSIVE METABOLIC PANEL
ALT: 12 U/L (ref 0–53)
AST: 16 U/L (ref 0–37)
Albumin: 4.2 g/dL (ref 3.5–5.2)
Alkaline Phosphatase: 49 U/L (ref 39–117)
BUN: 15 mg/dL (ref 6–23)
CO2: 31 mEq/L (ref 19–32)
Calcium: 9.5 mg/dL (ref 8.4–10.5)
Chloride: 105 mEq/L (ref 96–112)
Creatinine, Ser: 1.11 mg/dL (ref 0.40–1.50)
GFR: 95.5 mL/min (ref >60.00)
Glucose, Bld: 72 mg/dL (ref 70–99)
Potassium: 4.3 mEq/L (ref 3.5–5.1)
Sodium: 142 mEq/L (ref 135–145)
Total Bilirubin: 0.3 mg/dL (ref 0.2–1.2)
Total Protein: 7.3 g/dL (ref 6.0–8.3)

## 2019-09-18 NOTE — Telephone Encounter (Signed)
I left message for patient to come in for labs asap.

## 2019-09-19 LAB — CBC WITH DIFFERENTIAL
Basophils Absolute: 0.1 10*3/uL (ref 0.0–0.2)
Basos: 1 %
EOS (ABSOLUTE): 0.8 10*3/uL — ABNORMAL HIGH (ref 0.0–0.4)
Eos: 9 %
Hematocrit: 46 % (ref 37.5–51.0)
Hemoglobin: 14.7 g/dL (ref 13.0–17.7)
Immature Grans (Abs): 0.1 10*3/uL (ref 0.0–0.1)
Immature Granulocytes: 2 %
Lymphocytes Absolute: 4.5 10*3/uL — ABNORMAL HIGH (ref 0.7–3.1)
Lymphs: 50 %
MCH: 24.6 pg — ABNORMAL LOW (ref 26.6–33.0)
MCHC: 32 g/dL (ref 31.5–35.7)
MCV: 77 fL — ABNORMAL LOW (ref 79–97)
Monocytes Absolute: 0.6 10*3/uL (ref 0.1–0.9)
Monocytes: 6 %
NRBC: 1 % — ABNORMAL HIGH (ref 0–0)
Neutrophils Absolute: 2.9 10*3/uL (ref 1.4–7.0)
Neutrophils: 32 %
RBC: 5.98 x10E6/uL — ABNORMAL HIGH (ref 4.14–5.80)
RDW: 14.5 % (ref 11.6–15.4)
WBC: 8.9 10*3/uL (ref 3.4–10.8)

## 2019-09-24 LAB — STRATIFY JCV AB (W/ INDEX) W/ RFLX
Index Value: 0.4 — ABNORMAL HIGH
Stratify JCV (TM) Ab w/Reflex Inhibition: UNDETERMINED — AB

## 2019-09-24 LAB — RFLX STRATIFY JCV (TM) AB INHIBITION: JCV Antibody by Inhibition: NEGATIVE
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
## 2019-09-30 ENCOUNTER — Encounter: Payer: Self-pay | Admitting: Neurology

## 2019-09-30 ENCOUNTER — Other Ambulatory Visit: Payer: Self-pay

## 2019-09-30 ENCOUNTER — Ambulatory Visit (INDEPENDENT_AMBULATORY_CARE_PROVIDER_SITE_OTHER): Payer: Medicaid Other | Admitting: Neurology

## 2019-09-30 ENCOUNTER — Other Ambulatory Visit: Payer: Medicaid Other

## 2019-09-30 VITALS — BP 144/94 | HR 65 | Resp 20 | Ht 67.0 in | Wt 230.0 lb

## 2019-09-30 DIAGNOSIS — G35 Multiple sclerosis: Secondary | ICD-10-CM | POA: Diagnosis not present

## 2019-09-30 DIAGNOSIS — S069X4D Unspecified intracranial injury with loss of consciousness of 6 hours to 24 hours, subsequent encounter: Secondary | ICD-10-CM | POA: Diagnosis not present

## 2019-09-30 DIAGNOSIS — G40909 Epilepsy, unspecified, not intractable, without status epilepticus: Secondary | ICD-10-CM

## 2019-09-30 LAB — CBC WITH DIFFERENTIAL/PLATELET
Absolute Monocytes: 740 cells/uL (ref 200–950)
Basophils Absolute: 78 cells/uL (ref 0–200)
Basophils Relative: 0.9 %
Eosinophils Absolute: 905 cells/uL — ABNORMAL HIGH (ref 15–500)
Eosinophils Relative: 10.4 %
HCT: 45.4 % (ref 38.5–50.0)
Hemoglobin: 14.5 g/dL (ref 13.2–17.1)
Lymphs Abs: 3750 cells/uL (ref 850–3900)
MCH: 25 pg — ABNORMAL LOW (ref 27.0–33.0)
MCHC: 31.9 g/dL — ABNORMAL LOW (ref 32.0–36.0)
MCV: 78.4 fL — ABNORMAL LOW (ref 80.0–100.0)
MPV: 12.1 fL (ref 7.5–12.5)
Monocytes Relative: 8.5 %
Neutro Abs: 3228 cells/uL (ref 1500–7800)
Neutrophils Relative %: 37.1 %
Platelets: 148 10*3/uL (ref 140–400)
RBC: 5.79 10*6/uL (ref 4.20–5.80)
RDW: 14.5 % (ref 11.0–15.0)
Total Lymphocyte: 43.1 %
WBC: 8.7 10*3/uL (ref 3.8–10.8)

## 2019-09-30 NOTE — Patient Instructions (Addendum)
1.  I would like to switch the MS medication from Tysabri to Ocrevus. 2.  Check labs today:  CBC with diff, Hepatitis B panel, vitamin D level 3.  Continue levetiracetam 500mg  twice daily and Depakote ER 500mg  twice daily 4.  Continue D3 50,000 IU weekly 5.  Follow up in 6 months.    Your provider has requested that you have labwork completed today. Please go to [facility] ([id number]) on the second floor of this building before leaving the office today. You do not need to check in. If you are not called within 15 minutes please check with the front desk.

## 2019-10-01 LAB — ACUTE HEP PANEL AND HEP B SURFACE AB
HEPATITIS C ANTIBODY REFILL$(REFL): NONREACTIVE
Hep A IgM: NONREACTIVE
Hep B C IgM: NONREACTIVE
Hepatitis B Surface Ag: NONREACTIVE
SIGNAL TO CUT-OFF: 0.04 (ref <1.00)

## 2019-10-01 LAB — VITAMIN D 25 HYDROXY (VIT D DEFICIENCY, FRACTURES): Vit D, 25-Hydroxy: 24 ng/mL — ABNORMAL LOW (ref 30–100)

## 2019-10-01 LAB — REFLEX TIQ

## 2019-10-10 NOTE — Progress Notes (Unsigned)
Infusion start for Ocrevus. Pending start date should be after April 10,2021. [facility] is overseeing the referral.

## 2019-10-14 ENCOUNTER — Ambulatory Visit (HOSPITAL_COMMUNITY): Payer: Medicaid Other

## 2019-10-17 ENCOUNTER — Encounter: Payer: Self-pay | Admitting: *Deleted

## 2019-10-17 NOTE — Progress Notes (Addendum)
[facility] unable to get PA for this patient for Ocrevus due to Medicaid guidelines. Submitted today via a standard drug form on [employer] per recommendation of Saadia at [employer] call interaction ID [id number] She stated this medication is not approved through pharmacy  Must fax Standard drug form and clinicals to [phone] Waiting for determination   Called to check on status of PA. Call interaction [id number] spoke with Cristie Hem  She said to refax because the one I faxed on 10/17/19 was not logged into their system. I received confirmation of successful transmission but they do not have it. I refaxed everything today.

## 2019-10-28 NOTE — Progress Notes (Signed)
Sent new referral to [employer] to start process for Ocrevus.

## 2019-11-05 ENCOUNTER — Encounter: Payer: Self-pay | Admitting: Neurology

## 2019-11-05 NOTE — Progress Notes (Signed)
Please sign and print and I will attach to appeal. Thank You

## 2019-11-12 NOTE — Progress Notes (Signed)
Resubmitted PA with letter from Provider attached, to [facility] per Rep. 11/05/19 PA Denied.

## 2019-11-14 ENCOUNTER — Telehealth: Payer: Self-pay

## 2019-11-14 NOTE — Telephone Encounter (Signed)
Tried calling pt to inform pt tof denial for new medication and the new appeal we have started with [facility],  wanting to also see if pt still getting Tysbri injections.  Unable to LVM

## 2019-11-20 ENCOUNTER — Telehealth: Payer: Self-pay

## 2019-11-20 NOTE — Progress Notes (Signed)
Telephone call to [place] [facility]: Ref# [id number]  Rep had Pharmacist re review the PA. On hold for 30 minutes.  Per Pharmacist pt doesn't meet State Criteria. Denial letter sent out 11/18/19. Once letter is received and pt give [place] permission to start the Appeal process fax appeal back to [facility].  then [facility]  will call us and do a Peer to Peer with DR. Everlena Cooper, the pharmacist, and State.

## 2019-11-20 NOTE — Telephone Encounter (Signed)
Telephone call to [place] [facility]: Ref# [id number]  Rep had Pharmacist re review the PA. On hold for 30 minutes.  Per Pharmacist pt doesn't meet State Criteria. Denial letter sent out 11/18/19. Once letter is received and pt give us permission to start the Appeal process fax appeal back to [facility].  then [facility]  will call us and do a Peer to Peer with DR. Jaffe, the pharmacist, and State.  

## 2019-11-26 ENCOUNTER — Telehealth: Payer: Self-pay | Admitting: Neurology

## 2019-11-26 NOTE — Telephone Encounter (Signed)
Zenon Mayo, call patient care at [phone], they do the infusions in his past.

## 2019-11-26 NOTE — Telephone Encounter (Signed)
The following message was left with [employer] on 11/26/19 at 12:43 PM.  Caller states she is trying to reach Dr. Moises Blood nurse. She states the patient's prescription for Ocrevus has been denied.  I returned the patient's aunt's call and she is requesting an appeal on the denial.

## 2019-11-29 NOTE — Telephone Encounter (Signed)
Pt to call and schedule

## 2019-12-02 NOTE — Progress Notes (Signed)
Appeal form signed and received in office.   Form faxed to [facility] [phone], and [employer]- [phone] per form instructions.

## 2019-12-06 ENCOUNTER — Telehealth: Payer: Self-pay | Admitting: Neurology

## 2019-12-06 NOTE — Telephone Encounter (Signed)
[employer] rep called in about this patient's Tysabri. He stated the patient is a part of the Touch Program and if he is no longer taking this medication that a discontinuation form will need to be faxed in to Fax [phone]

## 2019-12-06 NOTE — Telephone Encounter (Signed)
Victor [name], CMA will fax back.

## 2019-12-09 ENCOUNTER — Telehealth: Payer: Self-pay | Admitting: Neurology

## 2019-12-09 NOTE — Telephone Encounter (Signed)
Spoke with Morrie Sheldon and she stated the patient was supposed to be on Ocrevus. Has this medication been approved and if so can she have a verbal order?   Spoke with Zenon Mayo who states we are waiting to hear back from patients insurance for Tysabri because Dr Everlena Cooper will have to do a peer to peer. Unsure why Tysabri was discontinued in the past. But we are trying to get the patient back on the correct medication regimen.   Informed Morrie Sheldon that we are waiting to hear back from patients insurance. She voiced understanding.

## 2019-12-09 NOTE — Telephone Encounter (Signed)
Victor [name] from [facility] called in and stated the patient has an appointment with them tomorrow and she was needing an order for some medication. Can someone give her a call?

## 2019-12-10 ENCOUNTER — Ambulatory Visit (HOSPITAL_COMMUNITY): Payer: Medicaid Other

## 2019-12-11 ENCOUNTER — Telehealth: Payer: Self-pay | Admitting: Neurology

## 2019-12-11 NOTE — Telephone Encounter (Signed)
telephone call back to [facility]: Spoke to [facility] Pt Expedite Appeal was denied. They felt like pt has time to wait since he is on another medication. Advised Kori pt is not on any medication right now and he needs to be approved.  Kor gave phone numbers to call to see if theat could be reversed.   [employer] [phone], Case# [id number] [facility] Pharmacy: [facility]ier is handling [place] case : [id number]

## 2019-12-11 NOTE — Telephone Encounter (Signed)
Caller states that we have been talking with her about his MS medication and medicaid. She states that it has to do with appeal for the MS medication but does not know the name of the medication   Phone number for the appeal is [phone]

## 2019-12-16 ENCOUNTER — Telehealth: Payer: Self-pay | Admitting: Neurology

## 2019-12-16 NOTE — Telephone Encounter (Signed)
Patient's mother came in the office and dropped off a list of medications. She stated the patient's medication was not approved by Medicaid and she had a conference call with them and they gave her the following medications that the patient needs to try before they would cover the current one.   Gilenya, Tecfidra - Oral Tablets Ec fampiridien Avontex Rebif Copaxone Betseros

## 2019-12-17 NOTE — Telephone Encounter (Signed)
I would like to request rescheduling conference call with me.  If we cannot get this medication approved, then I would like the name and license number of the reviewing physician who is denying our request.

## 2019-12-19 NOTE — Telephone Encounter (Signed)
Tried calling numbers given by [facility] no answer LMOVOM. No returned calls.  Also lvm for Kori to call back as well. Tried calling pt family no response.

## 2019-12-20 ENCOUNTER — Telehealth: Payer: Self-pay

## 2019-12-20 NOTE — Telephone Encounter (Signed)
Telephone call to pt, Pt and legal guardian needs to come and sign paperwork for Tysabri.

## 2019-12-24 NOTE — Telephone Encounter (Signed)
Tysabri forms filled out by provider and pt will fax over to [employer]

## 2019-12-26 NOTE — Progress Notes (Signed)
Discontinuation form and New Application form sent to [employer]

## 2019-12-30 NOTE — Progress Notes (Signed)
Confirmation Referral Received

## 2020-01-14 ENCOUNTER — Other Ambulatory Visit: Payer: Self-pay

## 2020-01-14 NOTE — Progress Notes (Signed)
Tysabri orders added. Infusion date 01/21/20

## 2020-01-16 ENCOUNTER — Telehealth: Payer: Self-pay

## 2020-01-16 NOTE — Telephone Encounter (Signed)
LMOVM Tysabri appt [facility] 01/20/20 at 9am.

## 2020-01-16 NOTE — Telephone Encounter (Signed)
Telephone call to [facility]. LMOVM please give [place] a call in regards to pt Appeal for Ocrevus. Left provider number as well please call for a PEER TO PEER

## 2020-01-17 ENCOUNTER — Telehealth: Payer: Self-pay

## 2020-01-17 NOTE — Telephone Encounter (Signed)
Telephone call from [place] at [facility] [employer], During the Conference call in May with the Pt Aunt they had a agreement. And the Case was closed on may 20th . She was unable to let me know what was discussed during the conference call. Per Thurston Hole it is a closed and private call.  We will have to call the family.

## 2020-01-20 ENCOUNTER — Inpatient Hospital Stay (HOSPITAL_COMMUNITY)
Admission: RE | Admit: 2020-01-20 | Discharge: 2020-01-20 | Disposition: A | Discharge status: Home / Self Care | Payer: Medicaid Other | Source: Ambulatory Visit

## 2020-01-21 ENCOUNTER — Encounter: Payer: Self-pay | Admitting: Neurology

## 2020-01-21 ENCOUNTER — Inpatient Hospital Stay (HOSPITAL_COMMUNITY): Admission: RE | Admit: 2020-01-21 | Payer: Medicaid Other | Source: Ambulatory Visit

## 2020-01-21 ENCOUNTER — Ambulatory Visit (HOSPITAL_COMMUNITY)
Admission: RE | Admit: 2020-01-21 | Discharge: 2020-01-21 | Disposition: A | Discharge status: Home / Self Care | Payer: Medicaid Other | Source: Ambulatory Visit | Attending: Internal Medicine | Admitting: Internal Medicine

## 2020-01-21 ENCOUNTER — Other Ambulatory Visit: Payer: Self-pay

## 2020-01-21 DIAGNOSIS — G35 Multiple sclerosis: Secondary | ICD-10-CM | POA: Diagnosis not present

## 2020-01-21 MED ORDER — SODIUM CHLORIDE 0.9 % IV SOLN
300.0000 mg | INTRAVENOUS | Status: DC
  Administered 2020-01-21: 300 mg via INTRAVENOUS
  Filled 2020-01-21: qty 15

## 2020-01-21 MED ORDER — ACETAMINOPHEN 500 MG PO TABS
1000.0000 mg | ORAL_TABLET | Freq: Once | ORAL | Status: AC
  Administered 2020-01-21: 1000 mg via ORAL
  Filled 2020-01-21: qty 2

## 2020-01-21 MED ORDER — SODIUM CHLORIDE 0.9 % IV SOLN
INTRAVENOUS | Status: DC | PRN
  Administered 2020-01-21: 250 mL via INTRAVENOUS

## 2020-01-21 MED ORDER — LORATADINE 10 MG PO TABS
10.0000 mg | ORAL_TABLET | Freq: Once | ORAL | Status: AC
  Administered 2020-01-21: 10 mg via ORAL
  Filled 2020-01-21: qty 1

## 2020-01-21 NOTE — Progress Notes (Signed)
Received fax from [employer] about authorization for the prescribing program valid 01/21/2020 - 07/21/2020. Patient enrollment #: [id number] for Tysabri infusions at [facility].

## 2020-01-21 NOTE — Progress Notes (Signed)
PATIENT CARE CENTER NOTE  Diagnosis:Multiple Sclorsis   Provider:Jaffe, Adam, DO   Procedure:Tysabri infusion   Note:Patient received Tysabri infusion via PIV. Pre-infusion medications given. Patient tolerated infusion well with no adverse reaction. Vital signs remained stable. Patient observed for 1 hour post-infusion. Discharge instructions given. Patient alert, oriented and ambulatory with roller walker at discharge.          

## 2020-01-21 NOTE — Discharge Instructions (Signed)
Natalizumab injection What is this medicine? NATALIZUMAB (na ta LIZ you mab) is used to treat relapsing multiple sclerosis. This drug is not a cure. It is also used to treat Crohn's disease. This medicine may be used for other purposes; ask your health care provider or pharmacist if you have questions. COMMON BRAND NAME(S): Tysabri What should I tell my health care provider before I take this medicine? They need to know if you have any of these conditions:  immune system problems  progressive multifocal leukoencephalopathy (PML)  an unusual or allergic reaction to natalizumab, other medicines, foods, dyes, or preservatives  pregnant or trying to get pregnant  breast-feeding How should I use this medicine? This medicine is for infusion into a vein. It is given by a health care professional in a hospital or clinic setting. A special MedGuide will be given to you by the pharmacist with each prescription and refill. Be sure to read this information carefully each time. Talk to your pediatrician regarding the use of this medicine in children. This medicine is not approved for use in children. Overdosage: If you think you have taken too much of this medicine contact a poison control center or emergency room at once. NOTE: This medicine is only for you. Do not share this medicine with others. What if I miss a dose? It is important not to miss your dose. Call your doctor or health care professional if you are unable to keep an appointment. What may interact with this medicine? Do not take this medicine with any of the following medications:  biologic medicines such as adalimumab, certolizumab, etanercept, golimumab, infliximab This medicine may also interact with the following medications:  azathioprine  cyclosporine  interferons  6-mercaptopurine  methotrexate  other medicines that lower your chance of fighting an infection  steroid medicines like prednisone or  cortisone  vaccines This list may not describe all possible interactions. Give your health care provider a list of all the medicines, herbs, non-prescription drugs, or dietary supplements you use. Also tell them if you smoke, drink alcohol, or use illegal drugs. Some items may interact with your medicine. What should I watch for while using this medicine? Your condition will be monitored carefully while you are receiving this medicine. Visit your doctor for regular check ups. Tell your doctor or healthcare professional if your symptoms do not start to get better or if they get worse. Stay away from people who are sick. Call your doctor or health care professional for advice if you get a fever, chills or sore throat, or other symptoms of a cold or flu. Do not treat yourself. In some patients, this medicine may cause a serious brain infection that may cause death. If you have any problems seeing, thinking, speaking, walking, or standing, tell your doctor right away. If you cannot reach your doctor, get urgent medical care. What side effects may I notice from receiving this medicine? Side effects that you should report to your doctor or health care professional as soon as possible:  allergic reactions like skin rash, itching or hives, swelling of the face, lips, or tongue  breathing problems  changes in vision  chest pain  confusion  depressed mood  dizziness  feeling faint; lightheaded; falls  general ill feeling or flu-like symptoms  loss of memory  missed menstrual periods  muscle weakness  problems with balance, talking, or walking  signs and symptoms of liver injury like dark yellow or brown urine; general ill feeling or flu-like symptoms; light-colored   stools; loss of appetite; nausea; right upper belly pain; unusually weak or tired; yellowing of the eyes or skin  suicidal thoughts, mood changes  unusual bruising or bleeding  unusually weak or tired Side effects that  usually do not require medical attention (report to your doctor or health care professional if they continue or are bothersome):  headache  joint pain  muscle cramps  muscle pain  nausea, vomiting  pain, redness, or irritation at site where injected  tiredness This list may not describe all possible side effects. Call your doctor for medical advice about side effects. You may report side effects to FDA at 1-800-FDA-1088. Where should I keep my medicine? This drug is given in a hospital or clinic and will not be stored at home. NOTE: This sheet is a summary. It may not cover all possible information. If you have questions about this medicine, talk to your doctor, pharmacist, or health care provider.  2020 Elsevier/Gold Standard (2019-01-14 13:20:26)  

## 2020-02-18 ENCOUNTER — Ambulatory Visit (HOSPITAL_COMMUNITY)
Admission: RE | Admit: 2020-02-18 | Discharge: 2020-02-18 | Disposition: A | Discharge status: Home / Self Care | Payer: Medicaid Other | Source: Ambulatory Visit | Attending: Internal Medicine | Admitting: Internal Medicine

## 2020-02-18 ENCOUNTER — Other Ambulatory Visit: Payer: Self-pay

## 2020-02-18 DIAGNOSIS — G35 Multiple sclerosis: Secondary | ICD-10-CM | POA: Insufficient documentation

## 2020-02-18 MED ORDER — SODIUM CHLORIDE 0.9 % IV SOLN
INTRAVENOUS | Status: DC | PRN
  Administered 2020-02-18: 250 mL via INTRAVENOUS

## 2020-02-18 MED ORDER — SODIUM CHLORIDE 0.9 % IV SOLN
300.0000 mg | INTRAVENOUS | Status: DC
  Administered 2020-02-18: 300 mg via INTRAVENOUS
  Filled 2020-02-18: qty 15

## 2020-02-18 MED ORDER — ACETAMINOPHEN 500 MG PO TABS
1000.0000 mg | ORAL_TABLET | Freq: Once | ORAL | Status: AC
  Administered 2020-02-18: 1000 mg via ORAL
  Filled 2020-02-18: qty 2

## 2020-02-18 MED ORDER — LORATADINE 10 MG PO TABS
10.0000 mg | ORAL_TABLET | Freq: Every day | ORAL | Status: DC
  Administered 2020-02-18: 10 mg via ORAL
  Filled 2020-02-18: qty 1

## 2020-02-18 NOTE — Discharge Instructions (Signed)
Natalizumab injection What is this medicine? NATALIZUMAB (na ta LIZ you mab) is used to treat relapsing multiple sclerosis. This drug is not a cure. It is also used to treat Crohn's disease. This medicine may be used for other purposes; ask your health care provider or pharmacist if you have questions. COMMON BRAND NAME(S): Tysabri What should I tell my health care provider before I take this medicine? They need to know if you have any of these conditions:  immune system problems  progressive multifocal leukoencephalopathy (PML)  an unusual or allergic reaction to natalizumab, other medicines, foods, dyes, or preservatives  pregnant or trying to get pregnant  breast-feeding How should I use this medicine? This medicine is for infusion into a vein. It is given by a health care professional in a hospital or clinic setting. A special MedGuide will be given to you by the pharmacist with each prescription and refill. Be sure to read this information carefully each time. Talk to your pediatrician regarding the use of this medicine in children. This medicine is not approved for use in children. Overdosage: If you think you have taken too much of this medicine contact a poison control center or emergency room at once. NOTE: This medicine is only for you. Do not share this medicine with others. What if I miss a dose? It is important not to miss your dose. Call your doctor or health care professional if you are unable to keep an appointment. What may interact with this medicine? Do not take this medicine with any of the following medications:  biologic medicines such as adalimumab, certolizumab, etanercept, golimumab, infliximab This medicine may also interact with the following medications:  azathioprine  cyclosporine  interferons  6-mercaptopurine  methotrexate  other medicines that lower your chance of fighting an infection  steroid medicines like prednisone or  cortisone  vaccines This list may not describe all possible interactions. Give your health care provider a list of all the medicines, herbs, non-prescription drugs, or dietary supplements you use. Also tell them if you smoke, drink alcohol, or use illegal drugs. Some items may interact with your medicine. What should I watch for while using this medicine? Your condition will be monitored carefully while you are receiving this medicine. Visit your doctor for regular check ups. Tell your doctor or healthcare professional if your symptoms do not start to get better or if they get worse. Stay away from people who are sick. Call your doctor or health care professional for advice if you get a fever, chills or sore throat, or other symptoms of a cold or flu. Do not treat yourself. In some patients, this medicine may cause a serious brain infection that may cause death. If you have any problems seeing, thinking, speaking, walking, or standing, tell your doctor right away. If you cannot reach your doctor, get urgent medical care. What side effects may I notice from receiving this medicine? Side effects that you should report to your doctor or health care professional as soon as possible:  allergic reactions like skin rash, itching or hives, swelling of the face, lips, or tongue  breathing problems  changes in vision  chest pain  confusion  depressed mood  dizziness  feeling faint; lightheaded; falls  general ill feeling or flu-like symptoms  loss of memory  missed menstrual periods  muscle weakness  problems with balance, talking, or walking  signs and symptoms of liver injury like dark yellow or brown urine; general ill feeling or flu-like symptoms; light-colored   stools; loss of appetite; nausea; right upper belly pain; unusually weak or tired; yellowing of the eyes or skin  suicidal thoughts, mood changes  unusual bruising or bleeding  unusually weak or tired Side effects that  usually do not require medical attention (report to your doctor or health care professional if they continue or are bothersome):  headache  joint pain  muscle cramps  muscle pain  nausea, vomiting  pain, redness, or irritation at site where injected  tiredness This list may not describe all possible side effects. Call your doctor for medical advice about side effects. You may report side effects to FDA at 1-800-FDA-1088. Where should I keep my medicine? This drug is given in a hospital or clinic and will not be stored at home. NOTE: This sheet is a summary. It may not cover all possible information. If you have questions about this medicine, talk to your doctor, pharmacist, or health care provider.  2020 Elsevier/Gold Standard (2019-01-14 13:20:26)  

## 2020-02-18 NOTE — Progress Notes (Signed)
PATIENT CARE CENTER NOTE  Diagnosis:Multiple Sclorsis   Provider:Jaffe, Adam, DO   Procedure:Tysabri infusion   Note:Patient received Tysabri infusion via PIV. Pre-infusion medications given. Patient tolerated infusion well with no adverse reaction. Vital signs remained stable. Patient observed for 1 hour post-infusion. Discharge instructions given. Patient alert, oriented and ambulatory with roller walker at discharge.

## 2020-03-20 ENCOUNTER — Other Ambulatory Visit: Payer: Self-pay

## 2020-03-20 ENCOUNTER — Ambulatory Visit (HOSPITAL_COMMUNITY)
Admission: RE | Admit: 2020-03-20 | Discharge: 2020-03-20 | Disposition: A | Discharge status: Home / Self Care | Payer: Medicaid Other | Source: Ambulatory Visit | Attending: Internal Medicine | Admitting: Internal Medicine

## 2020-03-20 DIAGNOSIS — G35 Multiple sclerosis: Secondary | ICD-10-CM | POA: Diagnosis present

## 2020-03-20 MED ORDER — ACETAMINOPHEN 500 MG PO TABS
1000.0000 mg | ORAL_TABLET | Freq: Once | ORAL | Status: AC
  Administered 2020-03-20: 1000 mg via ORAL
  Filled 2020-03-20: qty 2

## 2020-03-20 MED ORDER — SODIUM CHLORIDE 0.9 % IV SOLN
INTRAVENOUS | Status: DC | PRN
  Administered 2020-03-20: 250 mL via INTRAVENOUS

## 2020-03-20 MED ORDER — SODIUM CHLORIDE 0.9 % IV SOLN
300.0000 mg | INTRAVENOUS | Status: DC
  Administered 2020-03-20: 300 mg via INTRAVENOUS
  Filled 2020-03-20: qty 15

## 2020-03-20 MED ORDER — LORATADINE 10 MG PO TABS
10.0000 mg | ORAL_TABLET | Freq: Every day | ORAL | Status: DC
  Administered 2020-03-20: 10 mg via ORAL
  Filled 2020-03-20: qty 1

## 2020-03-20 NOTE — Progress Notes (Signed)
PATIENT CARE CENTER NOTE  Diagnosis:Multiple Sclorsis   Provider:Jaffe, Adam, DO   Procedure:Tysabri infusion   Note:Patient received Tysabri infusion via PIV. Pre-infusion medications given. Patient tolerated infusion well with no adverse reaction. Vital signs remained stable. Patient observed for 1 hour post-infusion. Discharge instructions given. Patient alert, oriented and ambulatoryto wheelchairat discharge.

## 2020-03-20 NOTE — Discharge Instructions (Signed)
Natalizumab injection What is this medicine? NATALIZUMAB (na ta LIZ you mab) is used to treat relapsing multiple sclerosis. This drug is not a cure. It is also used to treat Crohn's disease. This medicine may be used for other purposes; ask your health care provider or pharmacist if you have questions. COMMON BRAND NAME(S): Tysabri What should I tell my health care provider before I take this medicine? They need to know if you have any of these conditions:  immune system problems  progressive multifocal leukoencephalopathy (PML)  an unusual or allergic reaction to natalizumab, other medicines, foods, dyes, or preservatives  pregnant or trying to get pregnant  breast-feeding How should I use this medicine? This medicine is for infusion into a vein. It is given by a health care professional in a hospital or clinic setting. A special MedGuide will be given to you by the pharmacist with each prescription and refill. Be sure to read this information carefully each time. Talk to your pediatrician regarding the use of this medicine in children. This medicine is not approved for use in children. Overdosage: If you think you have taken too much of this medicine contact a poison control center or emergency room at once. NOTE: This medicine is only for you. Do not share this medicine with others. What if I miss a dose? It is important not to miss your dose. Call your doctor or health care professional if you are unable to keep an appointment. What may interact with this medicine? Do not take this medicine with any of the following medications:  biologic medicines such as adalimumab, certolizumab, etanercept, golimumab, infliximab This medicine may also interact with the following medications:  azathioprine  cyclosporine  interferons  6-mercaptopurine  methotrexate  other medicines that lower your chance of fighting an infection  steroid medicines like prednisone or  cortisone  vaccines This list may not describe all possible interactions. Give your health care provider a list of all the medicines, herbs, non-prescription drugs, or dietary supplements you use. Also tell them if you smoke, drink alcohol, or use illegal drugs. Some items may interact with your medicine. What should I watch for while using this medicine? Your condition will be monitored carefully while you are receiving this medicine. Visit your doctor for regular check ups. Tell your doctor or healthcare professional if your symptoms do not start to get better or if they get worse. Stay away from people who are sick. Call your doctor or health care professional for advice if you get a fever, chills or sore throat, or other symptoms of a cold or flu. Do not treat yourself. In some patients, this medicine may cause a serious brain infection that may cause death. If you have any problems seeing, thinking, speaking, walking, or standing, tell your doctor right away. If you cannot reach your doctor, get urgent medical care. What side effects may I notice from receiving this medicine? Side effects that you should report to your doctor or health care professional as soon as possible:  allergic reactions like skin rash, itching or hives, swelling of the face, lips, or tongue  breathing problems  changes in vision  chest pain  confusion  depressed mood  dizziness  feeling faint; lightheaded; falls  general ill feeling or flu-like symptoms  loss of memory  missed menstrual periods  muscle weakness  problems with balance, talking, or walking  signs and symptoms of liver injury like dark yellow or brown urine; general ill feeling or flu-like symptoms; light-colored   stools; loss of appetite; nausea; right upper belly pain; unusually weak or tired; yellowing of the eyes or skin  suicidal thoughts, mood changes  unusual bruising or bleeding  unusually weak or tired Side effects that  usually do not require medical attention (report to your doctor or health care professional if they continue or are bothersome):  headache  joint pain  muscle cramps  muscle pain  nausea, vomiting  pain, redness, or irritation at site where injected  tiredness This list may not describe all possible side effects. Call your doctor for medical advice about side effects. You may report side effects to FDA at 1-800-FDA-1088. Where should I keep my medicine? This drug is given in a hospital or clinic and will not be stored at home. NOTE: This sheet is a summary. It may not cover all possible information. If you have questions about this medicine, talk to your doctor, pharmacist, or health care provider.  2020 Elsevier/Gold Standard (2019-01-14 13:20:26)  

## 2020-04-17 ENCOUNTER — Ambulatory Visit (HOSPITAL_COMMUNITY)
Admission: RE | Admit: 2020-04-17 | Discharge: 2020-04-17 | Disposition: A | Discharge status: Home / Self Care | Payer: Medicaid Other | Source: Ambulatory Visit | Attending: Internal Medicine | Admitting: Internal Medicine

## 2020-04-17 ENCOUNTER — Other Ambulatory Visit: Payer: Self-pay

## 2020-04-17 DIAGNOSIS — G35 Multiple sclerosis: Secondary | ICD-10-CM | POA: Insufficient documentation

## 2020-04-17 MED ORDER — ACETAMINOPHEN 500 MG PO TABS
1000.0000 mg | ORAL_TABLET | Freq: Once | ORAL | Status: AC
  Administered 2020-04-17: 1000 mg via ORAL
  Filled 2020-04-17: qty 2

## 2020-04-17 MED ORDER — SODIUM CHLORIDE 0.9 % IV SOLN
300.0000 mg | INTRAVENOUS | Status: DC
  Administered 2020-04-17: 300 mg via INTRAVENOUS
  Filled 2020-04-17: qty 15

## 2020-04-17 MED ORDER — LORATADINE 10 MG PO TABS
10.0000 mg | ORAL_TABLET | Freq: Once | ORAL | Status: AC
  Administered 2020-04-17: 10 mg via ORAL
  Filled 2020-04-17: qty 1

## 2020-04-17 MED ORDER — SODIUM CHLORIDE 0.9 % IV SOLN
INTRAVENOUS | Status: DC | PRN
  Administered 2020-04-17: 250 mL via INTRAVENOUS

## 2020-04-17 NOTE — Progress Notes (Signed)
Patient received IV tysabri. Pre infusion medications were given as ordered by Shon Millet MD. Observed for at least 60 minutes post infusion.Tolerated well, vitals stable, discharge instructions given, verbalized understanding. Patient alert, oriented and transported in a wheelchair at the time of discharge. Left in the company of the aunt.

## 2020-04-17 NOTE — Discharge Instructions (Signed)
Natalizumab injection What is this medicine? NATALIZUMAB (na ta LIZ you mab) is used to treat relapsing multiple sclerosis. This drug is not a cure. It is also used to treat Crohn's disease. This medicine may be used for other purposes; ask your health care provider or pharmacist if you have questions. COMMON BRAND NAME(S): Tysabri What should I tell my health care provider before I take this medicine? They need to know if you have any of these conditions:  immune system problems  progressive multifocal leukoencephalopathy (PML)  an unusual or allergic reaction to natalizumab, other medicines, foods, dyes, or preservatives  pregnant or trying to get pregnant  breast-feeding How should I use this medicine? This medicine is for infusion into a vein. It is given by a health care professional in a hospital or clinic setting. A special MedGuide will be given to you by the pharmacist with each prescription and refill. Be sure to read this information carefully each time. Talk to your pediatrician regarding the use of this medicine in children. This medicine is not approved for use in children. Overdosage: If you think you have taken too much of this medicine contact a poison control center or emergency room at once. NOTE: This medicine is only for you. Do not share this medicine with others. What if I miss a dose? It is important not to miss your dose. Call your doctor or health care professional if you are unable to keep an appointment. What may interact with this medicine? Do not take this medicine with any of the following medications:  biologic medicines such as adalimumab, certolizumab, etanercept, golimumab, infliximab This medicine may also interact with the following medications:  azathioprine  cyclosporine  interferons  6-mercaptopurine  methotrexate  other medicines that lower your chance of fighting an infection  steroid medicines like prednisone or  cortisone  vaccines This list may not describe all possible interactions. Give your health care provider a list of all the medicines, herbs, non-prescription drugs, or dietary supplements you use. Also tell them if you smoke, drink alcohol, or use illegal drugs. Some items may interact with your medicine. What should I watch for while using this medicine? Your condition will be monitored carefully while you are receiving this medicine. Visit your doctor for regular check ups. Tell your doctor or healthcare professional if your symptoms do not start to get better or if they get worse. Stay away from people who are sick. Call your doctor or health care professional for advice if you get a fever, chills or sore throat, or other symptoms of a cold or flu. Do not treat yourself. In some patients, this medicine may cause a serious brain infection that may cause death. If you have any problems seeing, thinking, speaking, walking, or standing, tell your doctor right away. If you cannot reach your doctor, get urgent medical care. What side effects may I notice from receiving this medicine? Side effects that you should report to your doctor or health care professional as soon as possible:  allergic reactions like skin rash, itching or hives, swelling of the face, lips, or tongue  breathing problems  changes in vision  chest pain  confusion  depressed mood  dizziness  feeling faint; lightheaded; falls  general ill feeling or flu-like symptoms  loss of memory  missed menstrual periods  muscle weakness  problems with balance, talking, or walking  signs and symptoms of liver injury like dark yellow or brown urine; general ill feeling or flu-like symptoms; light-colored   stools; loss of appetite; nausea; right upper belly pain; unusually weak or tired; yellowing of the eyes or skin  suicidal thoughts, mood changes  unusual bruising or bleeding  unusually weak or tired Side effects that  usually do not require medical attention (report to your doctor or health care professional if they continue or are bothersome):  headache  joint pain  muscle cramps  muscle pain  nausea, vomiting  pain, redness, or irritation at site where injected  tiredness This list may not describe all possible side effects. Call your doctor for medical advice about side effects. You may report side effects to FDA at 1-800-FDA-1088. Where should I keep my medicine? This drug is given in a hospital or clinic and will not be stored at home. NOTE: This sheet is a summary. It may not cover all possible information. If you have questions about this medicine, talk to your doctor, pharmacist, or health care provider.  2020 Elsevier/Gold Standard (2019-01-14 13:20:26)  

## 2020-05-18 ENCOUNTER — Inpatient Hospital Stay (HOSPITAL_COMMUNITY): Admission: RE | Admit: 2020-05-18 | Payer: Medicaid Other | Source: Ambulatory Visit

## 2020-06-03 ENCOUNTER — Other Ambulatory Visit: Payer: Self-pay

## 2020-06-03 ENCOUNTER — Ambulatory Visit (HOSPITAL_COMMUNITY)
Admission: RE | Admit: 2020-06-03 | Discharge: 2020-06-03 | Disposition: A | Discharge status: Home / Self Care | Payer: Medicaid Other | Source: Ambulatory Visit | Attending: Internal Medicine | Admitting: Internal Medicine

## 2020-06-03 DIAGNOSIS — G35 Multiple sclerosis: Secondary | ICD-10-CM | POA: Diagnosis not present

## 2020-06-03 MED ORDER — LORATADINE 10 MG PO TABS
10.0000 mg | ORAL_TABLET | Freq: Every day | ORAL | Status: DC
  Administered 2020-06-03: 10 mg via ORAL
  Filled 2020-06-03: qty 1

## 2020-06-03 MED ORDER — SODIUM CHLORIDE 0.9 % IV SOLN
300.0000 mg | INTRAVENOUS | Status: DC
  Administered 2020-06-03: 300 mg via INTRAVENOUS
  Filled 2020-06-03: qty 15

## 2020-06-03 MED ORDER — ACETAMINOPHEN 500 MG PO TABS
1000.0000 mg | ORAL_TABLET | Freq: Once | ORAL | Status: AC
  Administered 2020-06-03: 1000 mg via ORAL
  Filled 2020-06-03: qty 2

## 2020-06-03 MED ORDER — SODIUM CHLORIDE 0.9 % IV SOLN
INTRAVENOUS | Status: DC | PRN
  Administered 2020-06-03: 250 mL via INTRAVENOUS

## 2020-06-03 NOTE — Discharge Instructions (Signed)
Natalizumab injection What is this medicine? NATALIZUMAB (na ta LIZ you mab) is used to treat relapsing multiple sclerosis. This drug is not a cure. It is also used to treat Crohn's disease. This medicine may be used for other purposes; ask your health care provider or pharmacist if you have questions. COMMON BRAND NAME(S): Tysabri What should I tell my health care provider before I take this medicine? They need to know if you have any of these conditions:  immune system problems  progressive multifocal leukoencephalopathy (PML)  an unusual or allergic reaction to natalizumab, other medicines, foods, dyes, or preservatives  pregnant or trying to get pregnant  breast-feeding How should I use this medicine? This medicine is for infusion into a vein. It is given by a health care professional in a hospital or clinic setting. A special MedGuide will be given to you by the pharmacist with each prescription and refill. Be sure to read this information carefully each time. Talk to your pediatrician regarding the use of this medicine in children. This medicine is not approved for use in children. Overdosage: If you think you have taken too much of this medicine contact a poison control center or emergency room at once. NOTE: This medicine is only for you. Do not share this medicine with others. What if I miss a dose? It is important not to miss your dose. Call your doctor or health care professional if you are unable to keep an appointment. What may interact with this medicine? Do not take this medicine with any of the following medications:  biologic medicines such as adalimumab, certolizumab, etanercept, golimumab, infliximab This medicine may also interact with the following medications:  azathioprine  cyclosporine  interferons  6-mercaptopurine  methotrexate  other medicines that lower your chance of fighting an infection  steroid medicines like prednisone or  cortisone  vaccines This list may not describe all possible interactions. Give your health care provider a list of all the medicines, herbs, non-prescription drugs, or dietary supplements you use. Also tell them if you smoke, drink alcohol, or use illegal drugs. Some items may interact with your medicine. What should I watch for while using this medicine? Your condition will be monitored carefully while you are receiving this medicine. Visit your doctor for regular check ups. Tell your doctor or healthcare professional if your symptoms do not start to get better or if they get worse. Stay away from people who are sick. Call your doctor or health care professional for advice if you get a fever, chills or sore throat, or other symptoms of a cold or flu. Do not treat yourself. In some patients, this medicine may cause a serious brain infection that may cause death. If you have any problems seeing, thinking, speaking, walking, or standing, tell your doctor right away. If you cannot reach your doctor, get urgent medical care. What side effects may I notice from receiving this medicine? Side effects that you should report to your doctor or health care professional as soon as possible:  allergic reactions like skin rash, itching or hives, swelling of the face, lips, or tongue  breathing problems  changes in vision  chest pain  confusion  depressed mood  dizziness  feeling faint; lightheaded; falls  general ill feeling or flu-like symptoms  loss of memory  missed menstrual periods  muscle weakness  problems with balance, talking, or walking  signs and symptoms of liver injury like dark yellow or brown urine; general ill feeling or flu-like symptoms; light-colored   stools; loss of appetite; nausea; right upper belly pain; unusually weak or tired; yellowing of the eyes or skin  suicidal thoughts, mood changes  unusual bruising or bleeding  unusually weak or tired Side effects that  usually do not require medical attention (report to your doctor or health care professional if they continue or are bothersome):  headache  joint pain  muscle cramps  muscle pain  nausea, vomiting  pain, redness, or irritation at site where injected  tiredness This list may not describe all possible side effects. Call your doctor for medical advice about side effects. You may report side effects to FDA at 1-800-FDA-1088. Where should I keep my medicine? This drug is given in a hospital or clinic and will not be stored at home. NOTE: This sheet is a summary. It may not cover all possible information. If you have questions about this medicine, talk to your doctor, pharmacist, or health care provider.  2020 Elsevier/Gold Standard (2019-01-14 13:20:26)  

## 2020-06-03 NOTE — Progress Notes (Signed)
Patient received IV Tysabri as ordered by Shon Millet, MD. Pre infusion medications were given. Observed for at least 60 minutes post infusion.Tolerated well, vitals stable, discharge instructions given, verbalized understanding. Patient alert, oriented and ambulatory with a walker at the time of discharge. Left in the company of the aunt.

## 2020-07-14 ENCOUNTER — Ambulatory Visit (HOSPITAL_COMMUNITY)
Admission: RE | Admit: 2020-07-14 | Discharge: 2020-07-14 | Disposition: A | Discharge status: Home / Self Care | Payer: Medicaid Other | Source: Ambulatory Visit | Attending: Internal Medicine | Admitting: Internal Medicine

## 2020-07-14 ENCOUNTER — Other Ambulatory Visit: Payer: Self-pay

## 2020-07-14 DIAGNOSIS — G35 Multiple sclerosis: Secondary | ICD-10-CM | POA: Diagnosis present

## 2020-07-14 MED ORDER — LORATADINE 10 MG PO TABS
10.0000 mg | ORAL_TABLET | Freq: Once | ORAL | Status: AC
  Administered 2020-07-14: 10 mg via ORAL
  Filled 2020-07-14: qty 1

## 2020-07-14 MED ORDER — SODIUM CHLORIDE 0.9 % IV SOLN
INTRAVENOUS | Status: DC | PRN
  Administered 2020-07-14: 250 mL via INTRAVENOUS

## 2020-07-14 MED ORDER — SODIUM CHLORIDE 0.9 % IV SOLN
300.0000 mg | INTRAVENOUS | Status: DC
  Administered 2020-07-14: 300 mg via INTRAVENOUS
  Filled 2020-07-14: qty 15

## 2020-07-14 MED ORDER — ACETAMINOPHEN 500 MG PO TABS
1000.0000 mg | ORAL_TABLET | Freq: Once | ORAL | Status: AC
  Administered 2020-07-14: 1000 mg via ORAL
  Filled 2020-07-14: qty 2

## 2020-07-14 NOTE — Progress Notes (Signed)
PATIENT CARE CENTER NOTE  Diagnosis:Multiple Sclorsis   Provider:Jaffe, Adam, DO   Procedure:Tysabri infusion   Note:Patient received Tysabri infusion via PIV. Pre-infusion medications given. Patient tolerated infusion well with no adverse reaction. Vital signs remained stable. Patient observed for 1 hour post-infusion. Discharge instructions given. Patient alert, oriented and ambulatoryto wheelchairat discharge.

## 2020-07-14 NOTE — Discharge Instructions (Signed)
Natalizumab injection What is this medicine? NATALIZUMAB (na ta LIZ you mab) is used to treat relapsing multiple sclerosis. This drug is not a cure. It is also used to treat Crohn's disease. This medicine may be used for other purposes; ask your health care provider or pharmacist if you have questions. COMMON BRAND NAME(S): Tysabri What should I tell my health care provider before I take this medicine? They need to know if you have any of these conditions:  immune system problems  progressive multifocal leukoencephalopathy (PML)  an unusual or allergic reaction to natalizumab, other medicines, foods, dyes, or preservatives  pregnant or trying to get pregnant  breast-feeding How should I use this medicine? This medicine is for infusion into a vein. It is given by a health care professional in a hospital or clinic setting. A special MedGuide will be given to you by the pharmacist with each prescription and refill. Be sure to read this information carefully each time. Talk to your pediatrician regarding the use of this medicine in children. This medicine is not approved for use in children. Overdosage: If you think you have taken too much of this medicine contact a poison control center or emergency room at once. NOTE: This medicine is only for you. Do not share this medicine with others. What if I miss a dose? It is important not to miss your dose. Call your doctor or health care professional if you are unable to keep an appointment. What may interact with this medicine? Do not take this medicine with any of the following medications:  biologic medicines such as adalimumab, certolizumab, etanercept, golimumab, infliximab This medicine may also interact with the following medications:  azathioprine  cyclosporine  interferons  6-mercaptopurine  methotrexate  other medicines that lower your chance of fighting an infection  steroid medicines like prednisone or  cortisone  vaccines This list may not describe all possible interactions. Give your health care provider a list of all the medicines, herbs, non-prescription drugs, or dietary supplements you use. Also tell them if you smoke, drink alcohol, or use illegal drugs. Some items may interact with your medicine. What should I watch for while using this medicine? Your condition will be monitored carefully while you are receiving this medicine. Visit your doctor for regular check ups. Tell your doctor or healthcare professional if your symptoms do not start to get better or if they get worse. Stay away from people who are sick. Call your doctor or health care professional for advice if you get a fever, chills or sore throat, or other symptoms of a cold or flu. Do not treat yourself. In some patients, this medicine may cause a serious brain infection that may cause death. If you have any problems seeing, thinking, speaking, walking, or standing, tell your doctor right away. If you cannot reach your doctor, get urgent medical care. What side effects may I notice from receiving this medicine? Side effects that you should report to your doctor or health care professional as soon as possible:  allergic reactions like skin rash, itching or hives, swelling of the face, lips, or tongue  breathing problems  changes in vision  chest pain  confusion  depressed mood  dizziness  feeling faint; lightheaded; falls  general ill feeling or flu-like symptoms  loss of memory  missed menstrual periods  muscle weakness  problems with balance, talking, or walking  signs and symptoms of liver injury like dark yellow or brown urine; general ill feeling or flu-like symptoms; light-colored   stools; loss of appetite; nausea; right upper belly pain; unusually weak or tired; yellowing of the eyes or skin  suicidal thoughts, mood changes  unusual bruising or bleeding  unusually weak or tired Side effects that  usually do not require medical attention (report to your doctor or health care professional if they continue or are bothersome):  headache  joint pain  muscle cramps  muscle pain  nausea, vomiting  pain, redness, or irritation at site where injected  tiredness This list may not describe all possible side effects. Call your doctor for medical advice about side effects. You may report side effects to FDA at 1-800-FDA-1088. Where should I keep my medicine? This drug is given in a hospital or clinic and will not be stored at home. NOTE: This sheet is a summary. It may not cover all possible information. If you have questions about this medicine, talk to your doctor, pharmacist, or health care provider.  2020 Elsevier/Gold Standard (2019-01-14 13:20:26)  

## 2020-08-14 ENCOUNTER — Encounter (HOSPITAL_COMMUNITY): Payer: Medicaid Other

## 2020-08-19 ENCOUNTER — Ambulatory Visit (HOSPITAL_COMMUNITY)
Admission: RE | Admit: 2020-08-19 | Discharge: 2020-08-19 | Disposition: A | Discharge status: Home / Self Care | Payer: Medicaid Other | Source: Ambulatory Visit | Attending: Internal Medicine | Admitting: Internal Medicine

## 2020-08-19 ENCOUNTER — Other Ambulatory Visit: Payer: Self-pay

## 2020-08-19 DIAGNOSIS — G35 Multiple sclerosis: Secondary | ICD-10-CM | POA: Diagnosis not present

## 2020-08-19 MED ORDER — SODIUM CHLORIDE 0.9 % IV SOLN
INTRAVENOUS | Status: DC | PRN
  Administered 2020-08-19: 250 mL via INTRAVENOUS

## 2020-08-19 MED ORDER — ACETAMINOPHEN 500 MG PO TABS
1000.0000 mg | ORAL_TABLET | Freq: Once | ORAL | Status: AC
  Administered 2020-08-19: 1000 mg via ORAL
  Filled 2020-08-19: qty 2

## 2020-08-19 MED ORDER — LORATADINE 10 MG PO TABS
10.0000 mg | ORAL_TABLET | Freq: Once | ORAL | Status: AC
  Administered 2020-08-19: 10 mg via ORAL
  Filled 2020-08-19: qty 1

## 2020-08-19 MED ORDER — SODIUM CHLORIDE 0.9 % IV SOLN
300.0000 mg | INTRAVENOUS | Status: DC
  Administered 2020-08-19: 300 mg via INTRAVENOUS
  Filled 2020-08-19: qty 15

## 2020-08-19 NOTE — Progress Notes (Addendum)
CENTER NOTE   Diagnosis: Multiple Sclerosis       Provider: Jaffe,Adam MD     Procedure: Tysabri IV     Note: Patient received Tysabri infusion (dose 7 of 12) via PIV. Pre infusion medications given. Tolerated infusion well with no adverse reaction. Vital signs stable. Discharge instructions given. Patient observed for 1 hour post-infusion. Pt states he feels well, and no s/s reaction noted.  Alert, oriented at discharge, taken in wheelchair by staff to meet his transportation in lobby.

## 2020-08-19 NOTE — Discharge Instructions (Signed)
Natalizumab injection What is this medicine? NATALIZUMAB (na ta LIZ you mab) is used to treat relapsing multiple sclerosis. This drug is not a cure. It is also used to treat Crohn's disease. This medicine may be used for other purposes; ask your health care provider or pharmacist if you have questions. COMMON BRAND NAME(S): Tysabri What should I tell my health care provider before I take this medicine? They need to know if you have any of these conditions:  immune system problems  progressive multifocal leukoencephalopathy (PML)  an unusual or allergic reaction to natalizumab, other medicines, foods, dyes, or preservatives  pregnant or trying to get pregnant  breast-feeding How should I use this medicine? This medicine is for infusion into a vein. It is given by a health care professional in a hospital or clinic setting. A special MedGuide will be given to you by the pharmacist with each prescription and refill. Be sure to read this information carefully each time. Talk to your pediatrician regarding the use of this medicine in children. This medicine is not approved for use in children. Overdosage: If you think you have taken too much of this medicine contact a poison control center or emergency room at once. NOTE: This medicine is only for you. Do not share this medicine with others. What if I miss a dose? It is important not to miss your dose. Call your doctor or health care professional if you are unable to keep an appointment. What may interact with this medicine? Do not take this medicine with any of the following medications:  biologic medicines such as adalimumab, certolizumab, etanercept, golimumab, infliximab This medicine may also interact with the following medications:  azathioprine  cyclosporine  interferons  6-mercaptopurine  methotrexate  other medicines that lower your chance of fighting an infection  steroid medicines like prednisone or  cortisone  vaccines This list may not describe all possible interactions. Give your health care provider a list of all the medicines, herbs, non-prescription drugs, or dietary supplements you use. Also tell them if you smoke, drink alcohol, or use illegal drugs. Some items may interact with your medicine. What should I watch for while using this medicine? Your condition will be monitored carefully while you are receiving this medicine. Visit your doctor for regular check ups. Tell your doctor or healthcare professional if your symptoms do not start to get better or if they get worse. Stay away from people who are sick. Call your doctor or health care professional for advice if you get a fever, chills or sore throat, or other symptoms of a cold or flu. Do not treat yourself. In some patients, this medicine may cause a serious brain infection that may cause death. If you have any problems seeing, thinking, speaking, walking, or standing, tell your doctor right away. If you cannot reach your doctor, get urgent medical care. What side effects may I notice from receiving this medicine? Side effects that you should report to your doctor or health care professional as soon as possible:  allergic reactions like skin rash, itching or hives, swelling of the face, lips, or tongue  breathing problems  changes in vision  chest pain  confusion  depressed mood  dizziness  feeling faint; lightheaded; falls  general ill feeling or flu-like symptoms  loss of memory  missed menstrual periods  muscle weakness  problems with balance, talking, or walking  signs and symptoms of liver injury like dark yellow or brown urine; general ill feeling or flu-like symptoms; light-colored   stools; loss of appetite; nausea; right upper belly pain; unusually weak or tired; yellowing of the eyes or skin  suicidal thoughts, mood changes  unusual bruising or bleeding  unusually weak or tired Side effects that  usually do not require medical attention (report to your doctor or health care professional if they continue or are bothersome):  headache  joint pain  muscle cramps  muscle pain  nausea, vomiting  pain, redness, or irritation at site where injected  tiredness This list may not describe all possible side effects. Call your doctor for medical advice about side effects. You may report side effects to FDA at 1-800-FDA-1088. Where should I keep my medicine? This drug is given in a hospital or clinic and will not be stored at home. NOTE: This sheet is a summary. It may not cover all possible information. If you have questions about this medicine, talk to your doctor, pharmacist, or health care provider.  2021 Elsevier/Gold Standard (2019-01-14 13:20:26)  

## 2020-09-18 ENCOUNTER — Other Ambulatory Visit: Payer: Self-pay

## 2020-09-18 ENCOUNTER — Non-Acute Institutional Stay (HOSPITAL_COMMUNITY)
Admission: RE | Admit: 2020-09-18 | Discharge: 2020-09-18 | Disposition: A | Discharge status: Home / Self Care | Payer: Medicaid Other | Source: Ambulatory Visit | Attending: Internal Medicine | Admitting: Internal Medicine

## 2020-09-18 DIAGNOSIS — G35 Multiple sclerosis: Secondary | ICD-10-CM | POA: Insufficient documentation

## 2020-09-18 MED ORDER — SODIUM CHLORIDE 0.9 % IV SOLN
INTRAVENOUS | Status: DC | PRN
  Administered 2020-09-18: 250 mL via INTRAVENOUS

## 2020-09-18 MED ORDER — SODIUM CHLORIDE 0.9 % IV SOLN
300.0000 mg | INTRAVENOUS | Status: DC
  Administered 2020-09-18: 300 mg via INTRAVENOUS
  Filled 2020-09-18: qty 15

## 2020-09-18 MED ORDER — LORATADINE 10 MG PO TABS
10.0000 mg | ORAL_TABLET | Freq: Once | ORAL | Status: AC
  Administered 2020-09-18: 10 mg via ORAL
  Filled 2020-09-18: qty 1

## 2020-09-18 MED ORDER — ACETAMINOPHEN 500 MG PO TABS
1000.0000 mg | ORAL_TABLET | Freq: Once | ORAL | Status: AC
  Administered 2020-09-18: 1000 mg via ORAL
  Filled 2020-09-18: qty 2

## 2020-09-18 NOTE — Progress Notes (Addendum)
PATIENT CARE CENTER NOTE  Diagnosis:Multiple Sclerosis   Provider:Jaffe,Adam MD   Procedure:Tysabri IV   Note:Patient received Tysabri infusion (dose 8 of 12)via PIV. Pre infusion medications given. Tolerated infusion well with no adverse reaction. Vital signs stable. Patientobserved for1 hour post-infusion.  Discharge instructions given. Patient alert, oriented at discharge, taken in wheelchair by staff to meet his transportation in lobby.

## 2020-09-18 NOTE — Discharge Instructions (Signed)
Natalizumab injection What is this medicine? NATALIZUMAB (na ta LIZ you mab) is used to treat relapsing multiple sclerosis. This drug is not a cure. It is also used to treat Crohn's disease. This medicine may be used for other purposes; ask your health care provider or pharmacist if you have questions. COMMON BRAND NAME(S): Tysabri What should I tell my health care provider before I take this medicine? They need to know if you have any of these conditions:  immune system problems  progressive multifocal leukoencephalopathy (PML)  an unusual or allergic reaction to natalizumab, other medicines, foods, dyes, or preservatives  pregnant or trying to get pregnant  breast-feeding How should I use this medicine? This medicine is for infusion into a vein. It is given by a health care professional in a hospital or clinic setting. A special MedGuide will be given to you by the pharmacist with each prescription and refill. Be sure to read this information carefully each time. Talk to your pediatrician regarding the use of this medicine in children. This medicine is not approved for use in children. Overdosage: If you think you have taken too much of this medicine contact a poison control center or emergency room at once. NOTE: This medicine is only for you. Do not share this medicine with others. What if I miss a dose? It is important not to miss your dose. Call your doctor or health care professional if you are unable to keep an appointment. What may interact with this medicine? Do not take this medicine with any of the following medications:  biologic medicines such as adalimumab, certolizumab, etanercept, golimumab, infliximab This medicine may also interact with the following medications:  azathioprine  cyclosporine  interferons  6-mercaptopurine  methotrexate  other medicines that lower your chance of fighting an infection  steroid medicines like prednisone or  cortisone  vaccines This list may not describe all possible interactions. Give your health care provider a list of all the medicines, herbs, non-prescription drugs, or dietary supplements you use. Also tell them if you smoke, drink alcohol, or use illegal drugs. Some items may interact with your medicine. What should I watch for while using this medicine? Your condition will be monitored carefully while you are receiving this medicine. Visit your doctor for regular check ups. Tell your doctor or healthcare professional if your symptoms do not start to get better or if they get worse. Stay away from people who are sick. Call your doctor or health care professional for advice if you get a fever, chills or sore throat, or other symptoms of a cold or flu. Do not treat yourself. In some patients, this medicine may cause a serious brain infection that may cause death. If you have any problems seeing, thinking, speaking, walking, or standing, tell your doctor right away. If you cannot reach your doctor, get urgent medical care. What side effects may I notice from receiving this medicine? Side effects that you should report to your doctor or health care professional as soon as possible:  allergic reactions like skin rash, itching or hives, swelling of the face, lips, or tongue  breathing problems  changes in vision  chest pain  confusion  depressed mood  dizziness  feeling faint; lightheaded; falls  general ill feeling or flu-like symptoms  loss of memory  missed menstrual periods  muscle weakness  problems with balance, talking, or walking  signs and symptoms of liver injury like dark yellow or brown urine; general ill feeling or flu-like symptoms; light-colored   stools; loss of appetite; nausea; right upper belly pain; unusually weak or tired; yellowing of the eyes or skin  suicidal thoughts, mood changes  unusual bruising or bleeding  unusually weak or tired Side effects that  usually do not require medical attention (report to your doctor or health care professional if they continue or are bothersome):  headache  joint pain  muscle cramps  muscle pain  nausea, vomiting  pain, redness, or irritation at site where injected  tiredness This list may not describe all possible side effects. Call your doctor for medical advice about side effects. You may report side effects to FDA at 1-800-FDA-1088. Where should I keep my medicine? This drug is given in a hospital or clinic and will not be stored at home. NOTE: This sheet is a summary. It may not cover all possible information. If you have questions about this medicine, talk to your doctor, pharmacist, or health care provider.  2021 Elsevier/Gold Standard (2019-01-14 13:20:26)  

## 2020-10-14 ENCOUNTER — Non-Acute Institutional Stay (HOSPITAL_COMMUNITY)
Admission: RE | Admit: 2020-10-14 | Discharge: 2020-10-14 | Disposition: A | Discharge status: Home / Self Care | Payer: Medicaid Other | Source: Ambulatory Visit | Attending: Internal Medicine | Admitting: Internal Medicine

## 2020-10-14 ENCOUNTER — Other Ambulatory Visit: Payer: Self-pay

## 2020-10-14 DIAGNOSIS — G35 Multiple sclerosis: Secondary | ICD-10-CM | POA: Insufficient documentation

## 2020-10-14 MED ORDER — LORATADINE 10 MG PO TABS
10.0000 mg | ORAL_TABLET | Freq: Once | ORAL | Status: AC
  Administered 2020-10-14: 10 mg via ORAL
  Filled 2020-10-14: qty 1

## 2020-10-14 MED ORDER — SODIUM CHLORIDE 0.9 % IV SOLN
300.0000 mg | INTRAVENOUS | Status: DC
  Administered 2020-10-14: 300 mg via INTRAVENOUS
  Filled 2020-10-14: qty 15

## 2020-10-14 MED ORDER — ACETAMINOPHEN 500 MG PO TABS
1000.0000 mg | ORAL_TABLET | Freq: Once | ORAL | Status: AC
  Administered 2020-10-14: 1000 mg via ORAL
  Filled 2020-10-14: qty 2

## 2020-10-14 MED ORDER — SODIUM CHLORIDE 0.9 % IV SOLN
INTRAVENOUS | Status: DC | PRN
  Administered 2020-10-14: 250 mL via INTRAVENOUS

## 2020-10-14 NOTE — Progress Notes (Signed)
PatientreceivedIVTysabri as ordered by Shon Millet, MD.Pre infusion medications were given.Observed for at least60 minutes post infusion.Tolerated well, vitals stable, discharge instructions given, verbalized understanding. Patient alert, oriented and transported in a wheelchair at the time of discharge. Left in the company of uncle Kevin Fenton.

## 2020-10-14 NOTE — Discharge Instructions (Signed)
Natalizumab injection What is this medicine? NATALIZUMAB (na ta LIZ you mab) is used to treat relapsing multiple sclerosis. This drug is not a cure. It is also used to treat Crohn's disease. This medicine may be used for other purposes; ask your health care provider or pharmacist if you have questions. COMMON BRAND NAME(S): Tysabri What should I tell my health care provider before I take this medicine? They need to know if you have any of these conditions:  immune system problems  progressive multifocal leukoencephalopathy (PML)  an unusual or allergic reaction to natalizumab, other medicines, foods, dyes, or preservatives  pregnant or trying to get pregnant  breast-feeding How should I use this medicine? This medicine is for infusion into a vein. It is given by a health care professional in a hospital or clinic setting. A special MedGuide will be given to you by the pharmacist with each prescription and refill. Be sure to read this information carefully each time. Talk to your pediatrician regarding the use of this medicine in children. This medicine is not approved for use in children. Overdosage: If you think you have taken too much of this medicine contact a poison control center or emergency room at once. NOTE: This medicine is only for you. Do not share this medicine with others. What if I miss a dose? It is important not to miss your dose. Call your doctor or health care professional if you are unable to keep an appointment. What may interact with this medicine? Do not take this medicine with any of the following medications:  biologic medicines such as adalimumab, certolizumab, etanercept, golimumab, infliximab This medicine may also interact with the following medications:  azathioprine  cyclosporine  interferons  6-mercaptopurine  methotrexate  other medicines that lower your chance of fighting an infection  steroid medicines like prednisone or  cortisone  vaccines This list may not describe all possible interactions. Give your health care provider a list of all the medicines, herbs, non-prescription drugs, or dietary supplements you use. Also tell them if you smoke, drink alcohol, or use illegal drugs. Some items may interact with your medicine. What should I watch for while using this medicine? Your condition will be monitored carefully while you are receiving this medicine. Visit your doctor for regular check ups. Tell your doctor or healthcare professional if your symptoms do not start to get better or if they get worse. Stay away from people who are sick. Call your doctor or health care professional for advice if you get a fever, chills or sore throat, or other symptoms of a cold or flu. Do not treat yourself. In some patients, this medicine may cause a serious brain infection that may cause death. If you have any problems seeing, thinking, speaking, walking, or standing, tell your doctor right away. If you cannot reach your doctor, get urgent medical care. What side effects may I notice from receiving this medicine? Side effects that you should report to your doctor or health care professional as soon as possible:  allergic reactions like skin rash, itching or hives, swelling of the face, lips, or tongue  breathing problems  changes in vision  chest pain  confusion  depressed mood  dizziness  feeling faint; lightheaded; falls  general ill feeling or flu-like symptoms  loss of memory  missed menstrual periods  muscle weakness  problems with balance, talking, or walking  signs and symptoms of liver injury like dark yellow or brown urine; general ill feeling or flu-like symptoms; light-colored   stools; loss of appetite; nausea; right upper belly pain; unusually weak or tired; yellowing of the eyes or skin  suicidal thoughts, mood changes  unusual bruising or bleeding  unusually weak or tired Side effects that  usually do not require medical attention (report to your doctor or health care professional if they continue or are bothersome):  headache  joint pain  muscle cramps  muscle pain  nausea, vomiting  pain, redness, or irritation at site where injected  tiredness This list may not describe all possible side effects. Call your doctor for medical advice about side effects. You may report side effects to FDA at 1-800-FDA-1088. Where should I keep my medicine? This drug is given in a hospital or clinic and will not be stored at home. NOTE: This sheet is a summary. It may not cover all possible information. If you have questions about this medicine, talk to your doctor, pharmacist, or health care provider.  2021 Elsevier/Gold Standard (2019-01-14 13:20:26)  

## 2020-11-11 ENCOUNTER — Other Ambulatory Visit: Payer: Self-pay

## 2020-11-11 ENCOUNTER — Non-Acute Institutional Stay (HOSPITAL_COMMUNITY)
Admission: RE | Admit: 2020-11-11 | Discharge: 2020-11-11 | Disposition: A | Discharge status: Home / Self Care | Payer: Medicaid Other | Source: Ambulatory Visit | Attending: Internal Medicine | Admitting: Internal Medicine

## 2020-11-11 DIAGNOSIS — G35 Multiple sclerosis: Secondary | ICD-10-CM | POA: Diagnosis not present

## 2020-11-11 MED ORDER — LORATADINE 10 MG PO TABS
10.0000 mg | ORAL_TABLET | Freq: Once | ORAL | Status: AC
  Administered 2020-11-11: 10 mg via ORAL
  Filled 2020-11-11: qty 1

## 2020-11-11 MED ORDER — ACETAMINOPHEN 500 MG PO TABS
1000.0000 mg | ORAL_TABLET | Freq: Once | ORAL | Status: AC
  Administered 2020-11-11: 1000 mg via ORAL
  Filled 2020-11-11: qty 2

## 2020-11-11 MED ORDER — SODIUM CHLORIDE 0.9 % IV SOLN
300.0000 mg | INTRAVENOUS | Status: DC
  Administered 2020-11-11: 300 mg via INTRAVENOUS
  Filled 2020-11-11: qty 15

## 2020-11-11 MED ORDER — SODIUM CHLORIDE 0.9 % IV SOLN
INTRAVENOUS | Status: DC | PRN
  Administered 2020-11-11: 250 mL via INTRAVENOUS

## 2020-11-11 NOTE — Progress Notes (Addendum)
CENTER NOTE   Diagnosis: Multiple Sclerosis       Provider: Shon Millet , MD     Procedure: Tysabri IV     Note: Patient received Tysabri infusion ( dose 10 of 12)  via PIV. Premeds given as ordered, PO Tylenol and Claritin. Tolerated infusion well with no adverse reaction. Vital signs stable. Discharge instructions given. Patient stayed for the 1 hour post-infusion observation. Next infusion appointment made at front desk. Alert, oriented at discharge, taken in wheelchair by Uncle at discharge.

## 2020-11-11 NOTE — Discharge Instructions (Signed)
Natalizumab injection What is this medicine? NATALIZUMAB (na ta LIZ you mab) is used to treat relapsing multiple sclerosis. This drug is not a cure. It is also used to treat Crohn's disease. This medicine may be used for other purposes; ask your health care provider or pharmacist if you have questions. COMMON BRAND NAME(S): Tysabri What should I tell my health care provider before I take this medicine? They need to know if you have any of these conditions:  immune system problems  progressive multifocal leukoencephalopathy (PML)  an unusual or allergic reaction to natalizumab, other medicines, foods, dyes, or preservatives  pregnant or trying to get pregnant  breast-feeding How should I use this medicine? This medicine is for infusion into a vein. It is given by a health care professional in a hospital or clinic setting. A special MedGuide will be given to you by the pharmacist with each prescription and refill. Be sure to read this information carefully each time. Talk to your pediatrician regarding the use of this medicine in children. This medicine is not approved for use in children. Overdosage: If you think you have taken too much of this medicine contact a poison control center or emergency room at once. NOTE: This medicine is only for you. Do not share this medicine with others. What if I miss a dose? It is important not to miss your dose. Call your doctor or health care professional if you are unable to keep an appointment. What may interact with this medicine? Do not take this medicine with any of the following medications:  biologic medicines such as adalimumab, certolizumab, etanercept, golimumab, infliximab This medicine may also interact with the following medications:  azathioprine  cyclosporine  interferons  6-mercaptopurine  methotrexate  other medicines that lower your chance of fighting an infection  steroid medicines like prednisone or  cortisone  vaccines This list may not describe all possible interactions. Give your health care provider a list of all the medicines, herbs, non-prescription drugs, or dietary supplements you use. Also tell them if you smoke, drink alcohol, or use illegal drugs. Some items may interact with your medicine. What should I watch for while using this medicine? Your condition will be monitored carefully while you are receiving this medicine. Visit your doctor for regular check ups. Tell your doctor or healthcare professional if your symptoms do not start to get better or if they get worse. Stay away from people who are sick. Call your doctor or health care professional for advice if you get a fever, chills or sore throat, or other symptoms of a cold or flu. Do not treat yourself. In some patients, this medicine may cause a serious brain infection that may cause death. If you have any problems seeing, thinking, speaking, walking, or standing, tell your doctor right away. If you cannot reach your doctor, get urgent medical care. What side effects may I notice from receiving this medicine? Side effects that you should report to your doctor or health care professional as soon as possible:  allergic reactions like skin rash, itching or hives, swelling of the face, lips, or tongue  breathing problems  changes in vision  chest pain  confusion  depressed mood  dizziness  feeling faint; lightheaded; falls  general ill feeling or flu-like symptoms  loss of memory  missed menstrual periods  muscle weakness  problems with balance, talking, or walking  signs and symptoms of liver injury like dark yellow or brown urine; general ill feeling or flu-like symptoms; light-colored   stools; loss of appetite; nausea; right upper belly pain; unusually weak or tired; yellowing of the eyes or skin  suicidal thoughts, mood changes  unusual bruising or bleeding  unusually weak or tired Side effects that  usually do not require medical attention (report to your doctor or health care professional if they continue or are bothersome):  headache  joint pain  muscle cramps  muscle pain  nausea, vomiting  pain, redness, or irritation at site where injected  tiredness This list may not describe all possible side effects. Call your doctor for medical advice about side effects. You may report side effects to FDA at 1-800-FDA-1088. Where should I keep my medicine? This drug is given in a hospital or clinic and will not be stored at home. NOTE: This sheet is a summary. It may not cover all possible information. If you have questions about this medicine, talk to your doctor, pharmacist, or health care provider.  2021 Elsevier/Gold Standard (2019-01-14 13:20:26)  

## 2020-12-09 ENCOUNTER — Encounter (HOSPITAL_COMMUNITY): Payer: Medicaid Other

## 2020-12-22 ENCOUNTER — Telehealth: Payer: Self-pay

## 2020-12-22 NOTE — Telephone Encounter (Signed)
Spoke to [employer],  pt needs to schedule a visit. He is due for renewal of medication and labs are due as well.    Please schedule with the front desk call transferred.
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
## 2021-01-08 ENCOUNTER — Ambulatory Visit (INDEPENDENT_AMBULATORY_CARE_PROVIDER_SITE_OTHER): Payer: Medicaid Other | Admitting: Neurology

## 2021-01-08 ENCOUNTER — Encounter: Payer: Self-pay | Admitting: Neurology

## 2021-01-08 ENCOUNTER — Other Ambulatory Visit: Payer: Medicaid Other

## 2021-01-08 ENCOUNTER — Other Ambulatory Visit: Payer: Self-pay

## 2021-01-08 VITALS — BP 125/74 | HR 74 | Ht 69.0 in | Wt 215.0 lb

## 2021-01-08 DIAGNOSIS — G35 Multiple sclerosis: Secondary | ICD-10-CM

## 2021-01-08 DIAGNOSIS — G40909 Epilepsy, unspecified, not intractable, without status epilepticus: Secondary | ICD-10-CM

## 2021-01-08 DIAGNOSIS — S069X4D Unspecified intracranial injury with loss of consciousness of 6 hours to 24 hours, subsequent encounter: Secondary | ICD-10-CM | POA: Diagnosis not present

## 2021-01-08 LAB — COMPREHENSIVE METABOLIC PANEL
ALT: 14 U/L (ref 0–53)
AST: 16 U/L (ref 0–37)
Albumin: 4.3 g/dL (ref 3.5–5.2)
Alkaline Phosphatase: 44 U/L (ref 39–117)
BUN: 12 mg/dL (ref 6–23)
CO2: 28 mEq/L (ref 19–32)
Calcium: 9.4 mg/dL (ref 8.4–10.5)
Chloride: 105 mEq/L (ref 96–112)
Creatinine, Ser: 0.94 mg/dL (ref 0.40–1.50)
GFR: 109.76 mL/min (ref >60.00)
Glucose, Bld: 88 mg/dL (ref 70–99)
Potassium: 4.2 mEq/L (ref 3.5–5.1)
Sodium: 142 mEq/L (ref 135–145)
Total Bilirubin: 0.2 mg/dL (ref 0.2–1.2)
Total Protein: 7.2 g/dL (ref 6.0–8.3)

## 2021-01-08 LAB — CBC WITH DIFFERENTIAL/PLATELET
Basophils Absolute: 0.1 10*3/uL (ref 0.0–0.1)
Basophils Relative: 0.9 % (ref 0.0–3.0)
Eosinophils Absolute: 1 10*3/uL — ABNORMAL HIGH (ref 0.0–0.7)
Eosinophils Relative: 10.5 % — ABNORMAL HIGH (ref 0.0–5.0)
HCT: 43.5 % (ref 39.0–52.0)
Hemoglobin: 14 g/dL (ref 13.0–17.0)
Lymphocytes Relative: 39.2 % (ref 12.0–46.0)
Lymphs Abs: 3.7 10*3/uL (ref 0.7–4.0)
MCHC: 32.2 g/dL (ref 30.0–36.0)
MCV: 80.9 fl (ref 78.0–100.0)
Monocytes Absolute: 0.8 10*3/uL (ref 0.1–1.0)
Monocytes Relative: 8.8 % (ref 3.0–12.0)
Neutro Abs: 3.8 10*3/uL (ref 1.4–7.7)
Neutrophils Relative %: 40.6 % — ABNORMAL LOW (ref 43.0–77.0)
Platelets: 156 10*3/uL (ref 150.0–400.0)
RBC: 5.38 Mil/uL (ref 4.22–5.81)
RDW: 15 % (ref 11.5–15.5)
WBC: 9.4 10*3/uL (ref 4.0–10.5)

## 2021-01-08 LAB — VITAMIN D 25 HYDROXY (VIT D DEFICIENCY, FRACTURES): VITD: 28.13 ng/mL — ABNORMAL LOW (ref 30.00–100.00)

## 2021-01-08 NOTE — Patient Instructions (Signed)
Continue Tysabri Continue Depakote and levetiracetam Continue vitamin D3 Check CBC with diff, CMP, vit D, JC Virus antibody with index - repeat in 6 months as well Check MRI of brain and cervical spine with and without contrast in 6 months Follow up in 6 months (after repeat labs and MRI)

## 2021-01-11 ENCOUNTER — Non-Acute Institutional Stay (HOSPITAL_COMMUNITY)
Admission: RE | Admit: 2021-01-11 | Discharge: 2021-01-11 | Disposition: A | Discharge status: Home / Self Care | Payer: Medicaid Other | Source: Ambulatory Visit | Attending: Internal Medicine | Admitting: Internal Medicine

## 2021-01-11 ENCOUNTER — Other Ambulatory Visit: Payer: Self-pay

## 2021-01-11 DIAGNOSIS — G35 Multiple sclerosis: Secondary | ICD-10-CM | POA: Diagnosis present

## 2021-01-11 MED ORDER — SODIUM CHLORIDE 0.9 % IV SOLN
300.0000 mg | INTRAVENOUS | Status: DC
  Administered 2021-01-11: 300 mg via INTRAVENOUS
  Filled 2021-01-11: qty 15

## 2021-01-11 MED ORDER — ACETAMINOPHEN 500 MG PO TABS
1000.0000 mg | ORAL_TABLET | Freq: Once | ORAL | Status: AC
  Administered 2021-01-11: 09:00:00 1000 mg via ORAL
  Filled 2021-01-11: qty 2

## 2021-01-11 MED ORDER — SODIUM CHLORIDE 0.9 % IV SOLN
INTRAVENOUS | Status: DC | PRN
  Administered 2021-01-11: 09:00:00 250 mL via INTRAVENOUS

## 2021-01-11 MED ORDER — LORATADINE 10 MG PO TABS
10.0000 mg | ORAL_TABLET | Freq: Every day | ORAL | Status: DC
  Administered 2021-01-11: 09:00:00 10 mg via ORAL
  Filled 2021-01-11: qty 1

## 2021-01-11 NOTE — Progress Notes (Signed)
PATIENT CARE CENTER NOTE   Diagnosis: Multiple Sclerosis       Provider: Jaffe,Adam MD     Procedure: Tysabri IV     Note: Patient received Tysabri infusion (dose 11 of 12) via PIV. Pre infusion medications given. Tolerated infusion well with no adverse reaction. Vital signs stable. Patient observed for 1 hour post-infusion.  Discharge instructions given. Patient alert, oriented at discharge, taken in wheelchair by staff to meet his transportation in lobby.

## 2021-01-13 ENCOUNTER — Other Ambulatory Visit: Payer: Self-pay

## 2021-01-13 DIAGNOSIS — G35 Multiple sclerosis: Secondary | ICD-10-CM

## 2021-01-13 LAB — STRATIFY JCV AB (W/ INDEX) W/ RFLX
Index Value: 0.28 — ABNORMAL HIGH
Stratify JCV (TM) Ab w/Reflex Inhibition: UNDETERMINED — AB

## 2021-01-13 LAB — RFLX STRATIFY JCV (TM) AB INHIBITION: JCV Antibody by Inhibition: NEGATIVE

## 2021-01-13 NOTE — Progress Notes (Signed)
Per [facility] Tysabri order needs to be added.

## 2021-01-14 ENCOUNTER — Telehealth: Payer: Self-pay

## 2021-01-14 NOTE — Telephone Encounter (Signed)
Message eft by a [employer] rep, Please give [place] a call or update on patient continuation form of tysabri.    Telephone call back to [employer], Pt has been seen and Victor [name] wants pt to continue on Tysabri waiting on labs to be reviewed.

## 2021-01-20 ENCOUNTER — Other Ambulatory Visit (HOSPITAL_COMMUNITY): Payer: Self-pay

## 2021-01-21 NOTE — Telephone Encounter (Signed)
[employer] touch form filled out and faxed

## 2021-01-28 NOTE — Progress Notes (Signed)
Tysabri infusion approved at [facility] at [id number]. Aurthorized througth 07/22/2021. Sent to scan.

## 2021-01-29 ENCOUNTER — Ambulatory Visit
Admission: RE | Admit: 2021-01-29 | Discharge: 2021-01-29 | Disposition: A | Discharge status: Home / Self Care | Payer: Medicaid Other | Source: Ambulatory Visit | Attending: Neurology | Admitting: Neurology

## 2021-01-29 ENCOUNTER — Other Ambulatory Visit: Payer: Self-pay

## 2021-01-29 DIAGNOSIS — G35 Multiple sclerosis: Secondary | ICD-10-CM

## 2021-01-29 MED ORDER — GADOBENATE DIMEGLUMINE 529 MG/ML IV SOLN
20.0000 mL | Freq: Once | INTRAVENOUS | Status: AC | PRN
  Administered 2021-01-29: 20 mL via INTRAVENOUS

## 2021-02-02 NOTE — Progress Notes (Signed)
Advised of mri results

## 2021-02-05 ENCOUNTER — Other Ambulatory Visit: Payer: Self-pay

## 2021-02-05 NOTE — Progress Notes (Signed)
Wrong medication added will order the correct medication Tysabri.

## 2021-02-05 NOTE — Addendum Note (Signed)
Addended by: Leida Lauth on: 02/05/2021 03:29 PM   Modules accepted: Orders

## 2021-02-08 ENCOUNTER — Other Ambulatory Visit: Payer: Self-pay

## 2021-02-08 ENCOUNTER — Non-Acute Institutional Stay (HOSPITAL_COMMUNITY)
Admission: RE | Admit: 2021-02-08 | Discharge: 2021-02-08 | Disposition: A | Discharge status: Home / Self Care | Payer: Medicaid Other | Source: Ambulatory Visit | Attending: Internal Medicine | Admitting: Internal Medicine

## 2021-02-08 DIAGNOSIS — G35 Multiple sclerosis: Secondary | ICD-10-CM | POA: Diagnosis present

## 2021-02-08 MED ORDER — SODIUM CHLORIDE 0.9 % IV SOLN
300.0000 mg | INTRAVENOUS | Status: DC
  Administered 2021-02-08: 300 mg via INTRAVENOUS
  Filled 2021-02-08: qty 15

## 2021-02-08 MED ORDER — ACETAMINOPHEN 500 MG PO TABS
1000.0000 mg | ORAL_TABLET | Freq: Once | ORAL | Status: AC
  Administered 2021-02-08: 1000 mg via ORAL
  Filled 2021-02-08: qty 2

## 2021-02-08 MED ORDER — SODIUM CHLORIDE 0.9 % IV SOLN
INTRAVENOUS | Status: DC | PRN
  Administered 2021-02-08: 250 mL via INTRAVENOUS

## 2021-02-08 MED ORDER — LORATADINE 10 MG PO TABS
10.0000 mg | ORAL_TABLET | Freq: Every day | ORAL | Status: DC
  Administered 2021-02-08: 10 mg via ORAL
  Filled 2021-02-08: qty 1

## 2021-02-08 NOTE — Progress Notes (Addendum)
CENTER NOTE   Diagnosis: Multiple Sclerosis       Provider: Shon Millet MD     Procedure: Donnamarie Poag IV     Note: Patient received Tysabri ( dose 12 of 12) infusion via PIV. Premeds given Tylenol and Claritin PO. Tolerated infusion well with no adverse reaction. Vital signs stable. Discharge instructions given, next appointment made with scheduler. Patient observed for 1 hour post-infusion, no s/s reaction noted. Alert, oriented and stable at discharge. Pt escorted to vehicle in wheelchair by his [employer]. Marland Kitchen

## 2021-03-08 ENCOUNTER — Non-Acute Institutional Stay (HOSPITAL_COMMUNITY)
Admission: RE | Admit: 2021-03-08 | Discharge: 2021-03-08 | Disposition: A | Discharge status: Home / Self Care | Payer: Medicaid Other | Source: Ambulatory Visit | Attending: Internal Medicine | Admitting: Internal Medicine

## 2021-03-08 ENCOUNTER — Other Ambulatory Visit: Payer: Self-pay

## 2021-03-08 DIAGNOSIS — G35 Multiple sclerosis: Secondary | ICD-10-CM | POA: Insufficient documentation

## 2021-03-08 MED ORDER — SODIUM CHLORIDE 0.9 % IV SOLN
INTRAVENOUS | Status: DC | PRN
  Administered 2021-03-08: 250 mL via INTRAVENOUS

## 2021-03-08 MED ORDER — LORATADINE 10 MG PO TABS
10.0000 mg | ORAL_TABLET | Freq: Every day | ORAL | Status: DC
  Administered 2021-03-08: 10 mg via ORAL
  Filled 2021-03-08: qty 1

## 2021-03-08 MED ORDER — ACETAMINOPHEN 325 MG PO TABS
650.0000 mg | ORAL_TABLET | Freq: Three times a day (TID) | ORAL | Status: DC
  Administered 2021-03-08: 650 mg via ORAL
  Filled 2021-03-08: qty 2

## 2021-03-08 MED ORDER — SODIUM CHLORIDE 0.9 % IV SOLN
300.0000 mg | Freq: Once | INTRAVENOUS | Status: AC
  Administered 2021-03-08: 300 mg via INTRAVENOUS
  Filled 2021-03-08: qty 15

## 2021-03-08 NOTE — Progress Notes (Signed)
PATIENT CARE CENTER NOTE   Diagnosis: Multiple Sclerosis       Provider: Shon Millet MD     Procedure: Tysabri IV     Note: Patient received Tysabri infusion via PIV. Pre infusion medications given. Tolerated infusion well with no adverse reaction. Vital signs stable. Patient observed for 1 hour post-infusion.  Spoke with Milbert Coulter, CMA at Dr. Moises Blood office. Per [place] Hard gave verbal order for a total of 6 doses of Tysabri.  Sheena to place updated Sign and Held order with number of Tysabri doses.  Discharge instructions given to patient. Patient alert, oriented and ambulatory with walker at discharge. Patient taken to meet his transportation in lobby.

## 2021-03-16 ENCOUNTER — Other Ambulatory Visit: Payer: Self-pay

## 2021-03-16 NOTE — Progress Notes (Signed)
Order For Tysabri Every 28 days added. Pt to get lab done in  December and a f/u visit to get further infusions.

## 2021-04-05 ENCOUNTER — Non-Acute Institutional Stay (HOSPITAL_COMMUNITY)
Admission: RE | Admit: 2021-04-05 | Discharge: 2021-04-05 | Disposition: A | Discharge status: Home / Self Care | Payer: Medicaid Other | Source: Ambulatory Visit | Attending: Internal Medicine | Admitting: Internal Medicine

## 2021-04-05 ENCOUNTER — Other Ambulatory Visit: Payer: Self-pay

## 2021-04-05 DIAGNOSIS — G35 Multiple sclerosis: Secondary | ICD-10-CM | POA: Diagnosis not present

## 2021-04-05 MED ORDER — LORATADINE 10 MG PO TABS
10.0000 mg | ORAL_TABLET | Freq: Once | ORAL | Status: AC
  Administered 2021-04-05: 10 mg via ORAL
  Filled 2021-04-05: qty 1

## 2021-04-05 MED ORDER — NATALIZUMAB 300 MG/15ML IV CONC
300.0000 mg | INTRAVENOUS | Status: DC
  Administered 2021-04-05: 300 mg via INTRAVENOUS
  Filled 2021-04-05: qty 15

## 2021-04-05 MED ORDER — SODIUM CHLORIDE 0.9 % IV SOLN
INTRAVENOUS | Status: DC | PRN
Start: 2021-04-05 — End: 2021-04-06
  Administered 2021-04-05: 250 mL via INTRAVENOUS

## 2021-04-05 MED ORDER — ACETAMINOPHEN 325 MG PO TABS
650.0000 mg | ORAL_TABLET | Freq: Once | ORAL | Status: AC
  Administered 2021-04-05: 650 mg via ORAL
  Filled 2021-04-05: qty 2

## 2021-04-05 NOTE — Progress Notes (Signed)
PATIENT CARE CENTER NOTE   Diagnosis: Multiple Sclerosis       Provider: Jaffe,Adam MD     Procedure: Tysabri IV     Note: Patient received Tysabri infusion (1 of 3) via PIV. Pre infusion medications given. Tolerated infusion well with no adverse reaction. Vital signs stable. Patient observed for 1 hour post-infusion.  Discharge instructions given to patient. Patient alert, oriented and ambulatory with walker at discharge. Patient taken to meet his transportation in lobby.

## 2021-05-03 ENCOUNTER — Other Ambulatory Visit: Payer: Self-pay

## 2021-05-03 ENCOUNTER — Non-Acute Institutional Stay (HOSPITAL_COMMUNITY)
Admission: RE | Admit: 2021-05-03 | Discharge: 2021-05-03 | Disposition: A | Discharge status: Home / Self Care | Payer: Medicaid Other | Source: Ambulatory Visit | Attending: Internal Medicine | Admitting: Internal Medicine

## 2021-05-03 DIAGNOSIS — G35 Multiple sclerosis: Secondary | ICD-10-CM | POA: Diagnosis not present

## 2021-05-03 MED ORDER — SODIUM CHLORIDE 0.9 % IV SOLN
INTRAVENOUS | Status: DC | PRN
  Administered 2021-05-03: 250 mL via INTRAVENOUS

## 2021-05-03 MED ORDER — ACETAMINOPHEN 325 MG PO TABS
650.0000 mg | ORAL_TABLET | Freq: Once | ORAL | Status: AC
  Administered 2021-05-03: 650 mg via ORAL
  Filled 2021-05-03: qty 2

## 2021-05-03 MED ORDER — SODIUM CHLORIDE 0.9 % IV SOLN
300.0000 mg | INTRAVENOUS | Status: DC
  Administered 2021-05-03: 300 mg via INTRAVENOUS
  Filled 2021-05-03: qty 15

## 2021-05-03 MED ORDER — LORATADINE 10 MG PO TABS
10.0000 mg | ORAL_TABLET | Freq: Every day | ORAL | Status: DC
  Administered 2021-05-03: 10 mg via ORAL
  Filled 2021-05-03: qty 1

## 2021-05-03 NOTE — Progress Notes (Signed)
PATIENT CARE CENTER NOTE    Diagnosis: Multiple Sclerosis       Provider: Jaffe,Adam MD     Procedure: Tysabri IV     Note: Patient received Tysabri infusion (2 of 3) via PIV. Pre infusion medications given. Tolerated infusion well with no adverse reaction. Vital signs stable. Patient observed for 1 hour post-infusion.  Discharge instructions given to patient. Patient alert, oriented and ambulatory to wheelchair at discharge. Patient taken to meet his transportation in lobby.

## 2021-05-31 ENCOUNTER — Non-Acute Institutional Stay (HOSPITAL_COMMUNITY)
Admission: RE | Admit: 2021-05-31 | Discharge: 2021-05-31 | Disposition: A | Discharge status: Home / Self Care | Payer: Medicaid Other | Source: Ambulatory Visit | Attending: Internal Medicine | Admitting: Internal Medicine

## 2021-05-31 ENCOUNTER — Other Ambulatory Visit: Payer: Self-pay

## 2021-05-31 DIAGNOSIS — G35 Multiple sclerosis: Secondary | ICD-10-CM | POA: Diagnosis not present

## 2021-05-31 MED ORDER — SODIUM CHLORIDE 0.9 % IV SOLN
300.0000 mg | INTRAVENOUS | Status: DC
  Administered 2021-05-31: 300 mg via INTRAVENOUS
  Filled 2021-05-31: qty 15

## 2021-05-31 MED ORDER — LORATADINE 10 MG PO TABS
10.0000 mg | ORAL_TABLET | Freq: Once | ORAL | Status: AC
  Administered 2021-05-31: 10 mg via ORAL
  Filled 2021-05-31: qty 1

## 2021-05-31 MED ORDER — ACETAMINOPHEN 325 MG PO TABS
650.0000 mg | ORAL_TABLET | Freq: Once | ORAL | Status: AC
  Administered 2021-05-31: 650 mg via ORAL
  Filled 2021-05-31: qty 2

## 2021-05-31 MED ORDER — SODIUM CHLORIDE 0.9 % IV SOLN: INTRAVENOUS | Status: DC | PRN

## 2021-05-31 NOTE — Progress Notes (Signed)
Patient received final Tysabri dose per most recent order. Order is now expired. Message sent to Milbert Coulter, CMA via secure chat. Awaiting advise on whether or not patient is to receive his next infusion on 06/28/21 or wait until after his f/u appointment with Dr. Everlena Cooper on 07/22/21.

## 2021-05-31 NOTE — Progress Notes (Signed)
PATIENT CARE CENTER NOTE     Diagnosis: Multiple Sclerosis       Provider: Jaffe,Adam MD     Procedure: Tysabri IV     Note: Patient received Tysabri infusion (3 of 3) via PIV. Pre infusion medications given. Tolerated infusion well with no adverse reaction. Vital signs stable. Patient observed for 1 hour post-infusion.  Discharge instructions given to patient. Patient alert, oriented and ambulatory to wheelchair at discharge. Patient taken to meet his transportation in lobby.

## 2021-06-25 ENCOUNTER — Other Ambulatory Visit: Payer: Self-pay

## 2021-06-25 DIAGNOSIS — G35 Multiple sclerosis: Secondary | ICD-10-CM

## 2021-06-28 ENCOUNTER — Non-Acute Institutional Stay (HOSPITAL_COMMUNITY)
Admission: RE | Admit: 2021-06-28 | Discharge: 2021-06-28 | Disposition: A | Discharge status: Home / Self Care | Payer: Medicaid Other | Source: Ambulatory Visit | Attending: Internal Medicine | Admitting: Internal Medicine

## 2021-06-28 ENCOUNTER — Other Ambulatory Visit: Payer: Self-pay

## 2021-06-28 DIAGNOSIS — G35 Multiple sclerosis: Secondary | ICD-10-CM | POA: Diagnosis present

## 2021-06-28 MED ORDER — LORATADINE 10 MG PO TABS
10.0000 mg | ORAL_TABLET | Freq: Once | ORAL | Status: AC
  Administered 2021-06-28: 10 mg via ORAL
  Filled 2021-06-28: qty 1

## 2021-06-28 MED ORDER — ACETAMINOPHEN 325 MG PO TABS
650.0000 mg | ORAL_TABLET | Freq: Once | ORAL | Status: AC
  Administered 2021-06-28: 650 mg via ORAL
  Filled 2021-06-28: qty 2

## 2021-06-28 MED ORDER — SODIUM CHLORIDE 0.9 % IV SOLN
300.0000 mg | Freq: Once | INTRAVENOUS | Status: AC
  Administered 2021-06-28: 300 mg via INTRAVENOUS
  Filled 2021-06-28: qty 15

## 2021-06-28 MED ORDER — SODIUM CHLORIDE 0.9 % IV SOLN: INTRAVENOUS | Status: DC | PRN

## 2021-06-28 NOTE — Progress Notes (Signed)
PATIENT CARE CENTER NOTE     Diagnosis: Multiple Sclerosis       Provider: Jaffe,Adam MD     Procedure: Tysabri IV     Note: Patient received Tysabri infusion (1 of 1) via PIV. Pre infusion medications given. Tolerated infusion well with no adverse reaction. Vital signs stable. Patient observed for 1 hour post-infusion.  Discharge instructions given to patient. Patient alert, oriented and ambulatory to wheelchair at discharge. Patient taken to meet his transportation in lobby.

## 2021-07-02 NOTE — Telephone Encounter (Signed)
F/u   Notice of patient authorization   This [employer] prescribing program authorization is valid from 12.5.2022 to 06.30.2023.
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
## 2021-07-22 ENCOUNTER — Other Ambulatory Visit: Payer: Medicaid Other

## 2021-07-22 ENCOUNTER — Encounter: Payer: Self-pay | Admitting: Neurology

## 2021-07-22 ENCOUNTER — Ambulatory Visit (INDEPENDENT_AMBULATORY_CARE_PROVIDER_SITE_OTHER): Payer: Medicaid Other | Admitting: Neurology

## 2021-07-22 ENCOUNTER — Other Ambulatory Visit: Payer: Self-pay

## 2021-07-22 VITALS — BP 102/60 | HR 73 | Ht 68.0 in | Wt 249.0 lb

## 2021-07-22 DIAGNOSIS — G40209 Localization-related (focal) (partial) symptomatic epilepsy and epileptic syndromes with complex partial seizures, not intractable, without status epilepticus: Secondary | ICD-10-CM | POA: Diagnosis not present

## 2021-07-22 DIAGNOSIS — G35 Multiple sclerosis: Secondary | ICD-10-CM

## 2021-07-22 DIAGNOSIS — S069X4D Unspecified intracranial injury with loss of consciousness of 6 hours to 24 hours, subsequent encounter: Secondary | ICD-10-CM | POA: Diagnosis not present

## 2021-07-22 LAB — COMPREHENSIVE METABOLIC PANEL
ALT: 18 U/L (ref 0–53)
AST: 18 U/L (ref 0–37)
Albumin: 4.4 g/dL (ref 3.5–5.2)
Alkaline Phosphatase: 44 U/L (ref 39–117)
BUN: 13 mg/dL (ref 6–23)
CO2: 33 mEq/L — ABNORMAL HIGH (ref 19–32)
Calcium: 9.7 mg/dL (ref 8.4–10.5)
Chloride: 103 mEq/L (ref 96–112)
Creatinine, Ser: 1.01 mg/dL (ref 0.40–1.50)
GFR: 100.32 mL/min (ref >60.00)
Glucose, Bld: 72 mg/dL (ref 70–99)
Potassium: 5 mEq/L (ref 3.5–5.1)
Sodium: 141 mEq/L (ref 135–145)
Total Bilirubin: 0.3 mg/dL (ref 0.2–1.2)
Total Protein: 7.2 g/dL (ref 6.0–8.3)

## 2021-07-22 LAB — CBC WITH DIFFERENTIAL/PLATELET
Basophils Absolute: 0.1 10*3/uL (ref 0.0–0.1)
Basophils Relative: 0.6 % (ref 0.0–3.0)
Eosinophils Absolute: 0.7 10*3/uL (ref 0.0–0.7)
Eosinophils Relative: 6.9 % — ABNORMAL HIGH (ref 0.0–5.0)
HCT: 45.4 % (ref 39.0–52.0)
Hemoglobin: 14.2 g/dL (ref 13.0–17.0)
Lymphocytes Relative: 45.8 % (ref 12.0–46.0)
Lymphs Abs: 4.6 10*3/uL — ABNORMAL HIGH (ref 0.7–4.0)
MCHC: 31.4 g/dL (ref 30.0–36.0)
MCV: 82.5 fl (ref 78.0–100.0)
Monocytes Absolute: 0.8 10*3/uL (ref 0.1–1.0)
Monocytes Relative: 8.3 % (ref 3.0–12.0)
Neutro Abs: 3.8 10*3/uL (ref 1.4–7.7)
Neutrophils Relative %: 38.4 % — ABNORMAL LOW (ref 43.0–77.0)
Platelets: 169 10*3/uL (ref 150.0–400.0)
RBC: 5.5 Mil/uL (ref 4.22–5.81)
RDW: 14.9 % (ref 11.5–15.5)
WBC: 10 10*3/uL (ref 4.0–10.5)

## 2021-07-22 LAB — VITAMIN D 25 HYDROXY (VIT D DEFICIENCY, FRACTURES): VITD: 26.82 ng/mL — ABNORMAL LOW (ref 30.00–100.00)

## 2021-07-22 NOTE — Patient Instructions (Addendum)
Continue Tysabri every 4 weeks Depakote DR 500mg  twice daily and levetiracetam 500mg  twice daily D3 50,000 IU weekly Check CBC with diff, CMP, vit D, JC Virus antibody with index - repeat in 6 months as well Follow up 6 months.

## 2021-07-23 ENCOUNTER — Other Ambulatory Visit: Payer: Self-pay

## 2021-07-27 ENCOUNTER — Other Ambulatory Visit: Payer: Self-pay

## 2021-07-27 ENCOUNTER — Non-Acute Institutional Stay (HOSPITAL_COMMUNITY)
Admission: RE | Admit: 2021-07-27 | Discharge: 2021-07-27 | Disposition: A | Discharge status: Home / Self Care | Payer: Medicaid Other | Source: Ambulatory Visit | Attending: Internal Medicine | Admitting: Internal Medicine

## 2021-07-27 DIAGNOSIS — G35 Multiple sclerosis: Secondary | ICD-10-CM | POA: Insufficient documentation

## 2021-07-27 MED ORDER — SODIUM CHLORIDE 0.9 % IV SOLN: INTRAVENOUS | Status: DC | PRN

## 2021-07-27 MED ORDER — LORATADINE 10 MG PO TABS
10.0000 mg | ORAL_TABLET | Freq: Once | ORAL | Status: AC
  Administered 2021-07-27: 10 mg via ORAL
  Filled 2021-07-27: qty 1

## 2021-07-27 MED ORDER — ACETAMINOPHEN 325 MG PO TABS
650.0000 mg | ORAL_TABLET | Freq: Three times a day (TID) | ORAL | Status: DC
  Administered 2021-07-27: 650 mg via ORAL
  Filled 2021-07-27: qty 2

## 2021-07-27 MED ORDER — SODIUM CHLORIDE 0.9 % IV SOLN
300.0000 mg | Freq: Once | INTRAVENOUS | Status: AC
  Administered 2021-07-27: 300 mg via INTRAVENOUS
  Filled 2021-07-27: qty 15

## 2021-07-27 NOTE — Progress Notes (Addendum)
PATIENT CARE CENTER NOTE   Diagnosis: Multiple Sclerosis       Provider: Shon Millet MD     Procedure: Donnamarie Poag 300mg      Note: Patient received Tysabri  infusion (dose# 1 of 2) via PIV. BP was 155/74 prior to infusion, pt states he thinks he forgot to take his BP meds this morning. Dr. notified, order given to proceed with administration of medication, no further intervention needed. Pre meds given, Tylenol and Claritin PO ,as ordered. Tolerated infusion well with no adverse reaction. Vital signs stable, post infusion BP 121/66. Patient observed for 1 hour post infusion, no s/s reaction noted.  Pt scheduled for next infusion on 08/24/21, provided with date and time , verbalized understanding.  Discharge instructions given. Pt alert, oriented and taken in wheelchair accompanied by his 08/26/21.

## 2021-07-29 ENCOUNTER — Telehealth: Payer: Self-pay

## 2021-07-29 NOTE — Telephone Encounter (Signed)
-----   Message from Drema Dallas, DO sent at 07/29/2021  7:23 AM EST ----- Vit D level is still low.  He reportedly takes D3 50,000 IU weekly.  Please verify if this is true.  If it is, then I would like to change dose of D3 to 8000 IU DAILY.  Repeat in 6 months along with CBC with diff, CMP, JC Virus antibody and index.

## 2021-07-29 NOTE — Telephone Encounter (Signed)
Telephone call to pt, Spoke to patients uncle. Advised of lab results. Recommendation  from dr.Jaffe to increase Vitamin D3 to 8000 IU daily.

## 2021-07-31 LAB — STRATIFY JCV AB (W/ INDEX) W/ RFLX
Index Value: 0.29 — ABNORMAL HIGH
Stratify JCV (TM) Ab w/Reflex Inhibition: UNDETERMINED — AB

## 2021-07-31 LAB — RFLX STRATIFY JCV (TM) AB INHIBITION: JCV Antibody by Inhibition: NEGATIVE

## 2021-08-13 LAB — STRATIFY JCV(TM) AB W/INDEX
JCV Antibody by Inhibition: NEGATIVE
JCV Antibody: UNDETERMINED
JCV Index Value: 0.3

## 2021-08-24 ENCOUNTER — Non-Acute Institutional Stay (HOSPITAL_COMMUNITY)
Admission: RE | Admit: 2021-08-24 | Discharge: 2021-08-24 | Disposition: A | Discharge status: Home / Self Care | Payer: Medicaid Other | Source: Ambulatory Visit | Attending: Internal Medicine | Admitting: Internal Medicine

## 2021-08-24 ENCOUNTER — Other Ambulatory Visit: Payer: Self-pay

## 2021-08-24 DIAGNOSIS — G35 Multiple sclerosis: Secondary | ICD-10-CM

## 2021-08-24 MED ORDER — ACETAMINOPHEN 325 MG PO TABS
650.0000 mg | ORAL_TABLET | Freq: Once | ORAL | Status: AC
  Administered 2021-08-24: 650 mg via ORAL
  Filled 2021-08-24: qty 2

## 2021-08-24 MED ORDER — SODIUM CHLORIDE 0.9 % IV SOLN: INTRAVENOUS | Status: DC | PRN

## 2021-08-24 MED ORDER — LORATADINE 10 MG PO TABS
10.0000 mg | ORAL_TABLET | Freq: Once | ORAL | Status: AC
  Administered 2021-08-24: 10 mg via ORAL
  Filled 2021-08-24: qty 1

## 2021-08-24 MED ORDER — SODIUM CHLORIDE 0.9 % IV SOLN
300.0000 mg | INTRAVENOUS | Status: DC
  Administered 2021-08-24: 300 mg via INTRAVENOUS
  Filled 2021-08-24: qty 15

## 2021-08-24 NOTE — Progress Notes (Addendum)
CENTER NOTE   Diagnosis: Multiple Sclerosis       Provider: Shon Millet MD     Procedure: Donnamarie Poag 300mg      Note: Patient received Tysabri infusion ( dose 2 of 2) via PIV. Premeds of Tylenol and Claritin PO given prior to infusion as ordered. Tolerated infusion well with no adverse reaction. Vital signs stable. Discharge instructions given. Patient observed for 1 hour post infusion, no s/s reaction noted. Pt scheduled for next infusion on 09/21/21, pt aware. Alert and oriented at discharge and taken in wheelchair accompanied by 09/23/21.

## 2021-09-21 ENCOUNTER — Other Ambulatory Visit: Payer: Self-pay

## 2021-09-21 ENCOUNTER — Non-Acute Institutional Stay (HOSPITAL_COMMUNITY)
Admission: RE | Admit: 2021-09-21 | Discharge: 2021-09-21 | Disposition: A | Discharge status: Home / Self Care | Payer: Medicaid Other | Source: Ambulatory Visit | Attending: Internal Medicine | Admitting: Internal Medicine

## 2021-09-21 DIAGNOSIS — G35 Multiple sclerosis: Secondary | ICD-10-CM | POA: Insufficient documentation

## 2021-09-21 MED ORDER — SODIUM CHLORIDE 0.9 % IV SOLN: INTRAVENOUS | Status: DC | PRN

## 2021-09-21 MED ORDER — SODIUM CHLORIDE 0.9 % IV SOLN
300.0000 mg | INTRAVENOUS | Status: DC
  Administered 2021-09-21: 300 mg via INTRAVENOUS
  Filled 2021-09-21: qty 15

## 2021-09-21 MED ORDER — LORATADINE 10 MG PO TABS
10.0000 mg | ORAL_TABLET | Freq: Once | ORAL | Status: AC
  Administered 2021-09-21: 10 mg via ORAL
  Filled 2021-09-21: qty 1

## 2021-09-21 MED ORDER — ACETAMINOPHEN 325 MG PO TABS
650.0000 mg | ORAL_TABLET | Freq: Once | ORAL | Status: AC
  Administered 2021-09-21: 650 mg via ORAL
  Filled 2021-09-21: qty 2

## 2021-09-21 NOTE — Progress Notes (Signed)
PATIENT CARE CENTER NOTE   Diagnosis: Multiple Sclerosis       Provider: Shon Millet MD     Procedure: Donnamarie Poag 300mg      Note: Patient received Tysabri ( dose 1 of 1) infusion via PIV. Premedicated with Claritin and Tylenol PO as ordered. Tolerated infusion well with no adverse reaction. BP elevated throughout infusion, Dr. notified, no further intervention ordered. BP at completion of infusion was 149/78, provider made aware.  All other VSS. Discharge instructions given, pt to RTC on 10/19/21 for next infusion , verbalized understanding. . Patient observed for 1 hour post infusion, no s/s of a reaction noted.  Alert, oriented and ambulatory with walker at discharge, accompanied by 10/21/21.

## 2021-10-19 ENCOUNTER — Encounter (HOSPITAL_COMMUNITY): Payer: Medicaid Other

## 2021-11-04 ENCOUNTER — Non-Acute Institutional Stay (HOSPITAL_COMMUNITY)
Admission: RE | Admit: 2021-11-04 | Discharge: 2021-11-04 | Disposition: A | Discharge status: Home / Self Care | Payer: Medicaid Other | Source: Ambulatory Visit | Attending: Internal Medicine | Admitting: Internal Medicine

## 2021-11-04 DIAGNOSIS — G35 Multiple sclerosis: Secondary | ICD-10-CM | POA: Insufficient documentation

## 2021-11-04 MED ORDER — ACETAMINOPHEN ER 650 MG PO TBCR: 650.0000 mg | EXTENDED_RELEASE_TABLET | ORAL | Status: DC

## 2021-11-04 MED ORDER — SODIUM CHLORIDE 0.9 % IV SOLN: INTRAVENOUS | Status: DC | PRN

## 2021-11-04 MED ORDER — SODIUM CHLORIDE 0.9 % IV SOLN
300.0000 mg | INTRAVENOUS | Status: DC
  Administered 2021-11-04: 300 mg via INTRAVENOUS
  Filled 2021-11-04: qty 15

## 2021-11-04 MED ORDER — LORATADINE 10 MG PO TABS
10.0000 mg | ORAL_TABLET | ORAL | Status: DC
  Administered 2021-11-04: 10 mg via ORAL
  Filled 2021-11-04: qty 1

## 2021-11-04 MED ORDER — ACETAMINOPHEN 325 MG PO TABS
650.0000 mg | ORAL_TABLET | ORAL | Status: DC
  Administered 2021-11-04: 650 mg via ORAL
  Filled 2021-11-04: qty 2

## 2021-11-04 NOTE — Progress Notes (Signed)
PATIENT CARE CENTER NOTE ?  ?  ?Diagnosis: Multiple Sclerosis  ?  ?   ?Provider: Shon Millet MD ?  ?  ?Procedure: Tysabri IV ?  ?  ?Note: Patient received Tysabri infusion (1 of 5) via PIV. Pre infusion medications (Tylenol and Claritin) given per order. Tolerated infusion well with no adverse reaction. Vital signs stable. Patient observed for 1 hour post-infusion.  Discharge instructions given to patient. Patient's next appointment scheduled for 12/02/2021.  Patient alert, oriented and ambulatory with walker at discharge. Patient taken to meet his transportation in lobby. ?

## 2021-12-02 ENCOUNTER — Non-Acute Institutional Stay (HOSPITAL_COMMUNITY)
Admission: RE | Admit: 2021-12-02 | Discharge: 2021-12-02 | Disposition: A | Discharge status: Home / Self Care | Payer: Medicaid Other | Source: Ambulatory Visit | Attending: Internal Medicine | Admitting: Internal Medicine

## 2021-12-02 DIAGNOSIS — G35 Multiple sclerosis: Secondary | ICD-10-CM | POA: Diagnosis present

## 2021-12-02 MED ORDER — SODIUM CHLORIDE 0.9 % IV SOLN: INTRAVENOUS | Status: DC | PRN

## 2021-12-02 MED ORDER — SODIUM CHLORIDE 0.9 % IV SOLN
300.0000 mg | INTRAVENOUS | Status: DC
  Administered 2021-12-02: 300 mg via INTRAVENOUS
  Filled 2021-12-02: qty 15

## 2021-12-02 MED ORDER — LORATADINE 10 MG PO TABS
10.0000 mg | ORAL_TABLET | Freq: Once | ORAL | Status: AC
  Administered 2021-12-02: 10 mg via ORAL
  Filled 2021-12-02: qty 1

## 2021-12-02 MED ORDER — ACETAMINOPHEN 325 MG PO TABS
650.0000 mg | ORAL_TABLET | Freq: Once | ORAL | Status: AC
  Administered 2021-12-02: 650 mg via ORAL
  Filled 2021-12-02: qty 2

## 2021-12-02 NOTE — Progress Notes (Signed)
PATIENT CARE CENTER NOTE ?  ?  ?Diagnosis: Multiple Sclerosis  ?  ?   ?Provider: Shon Millet MD ?  ?  ?Procedure: Tysabri IV ?  ?  ?Note: Patient received Tysabri infusion (dose 2 of 5) via PIV. Pre infusion medications (Tylenol and Claritin) given per order. Tolerated infusion well with no adverse reaction. Vital signs stable. Patient observed for 1 hour post-infusion.  Discharge instructions given to patient. Patient's next appointment scheduled for 12/30/2021.  Patient alert, oriented and transported in wheelchair at discharge. Patient taken to meet his transportation in lobby. ?

## 2021-12-30 ENCOUNTER — Non-Acute Institutional Stay (HOSPITAL_COMMUNITY)
Admission: RE | Admit: 2021-12-30 | Discharge: 2021-12-30 | Disposition: A | Discharge status: Home / Self Care | Payer: Medicaid Other | Source: Ambulatory Visit | Attending: Internal Medicine | Admitting: Internal Medicine

## 2021-12-30 DIAGNOSIS — G35 Multiple sclerosis: Secondary | ICD-10-CM | POA: Diagnosis not present

## 2021-12-30 MED ORDER — SODIUM CHLORIDE 0.9 % IV SOLN
INTRAVENOUS | Status: DC | PRN
Start: 2021-12-30 — End: 2021-12-31

## 2021-12-30 MED ORDER — LORATADINE 10 MG PO TABS
10.0000 mg | ORAL_TABLET | Freq: Once | ORAL | Status: AC
  Administered 2021-12-30: 10 mg via ORAL
  Filled 2021-12-30: qty 1

## 2021-12-30 MED ORDER — SODIUM CHLORIDE 0.9 % IV SOLN
300.0000 mg | INTRAVENOUS | Status: DC
  Administered 2021-12-30: 300 mg via INTRAVENOUS
  Filled 2021-12-30: qty 15

## 2021-12-30 MED ORDER — ACETAMINOPHEN 325 MG PO TABS
650.0000 mg | ORAL_TABLET | Freq: Once | ORAL | Status: AC
Start: 2021-12-30 — End: 2021-12-30
  Administered 2021-12-30: 650 mg via ORAL
  Filled 2021-12-30: qty 2

## 2021-12-30 NOTE — Progress Notes (Signed)
PATIENT CARE CENTER NOTE     Diagnosis: Multiple Sclerosis       Provider: Shon Millet MD     Procedure: Donnamarie Poag IV     Note: Patient received Tysabri infusion (dose 3 of 5) via PIV. Pre infusion medications (Tylenol and Claritin) given per order. Tolerated infusion well with no adverse reaction. Vital signs stable. Patient observed for 1 hour post-infusion.  Discharge instructions given to patient. Patient's next appointment scheduled for 01/27/2022.  Patient alert, oriented and transported in wheelchair at discharge. Patient taken to meet his transportation in lobby.

## 2022-01-21 ENCOUNTER — Other Ambulatory Visit: Payer: Self-pay

## 2022-01-21 DIAGNOSIS — G35 Multiple sclerosis: Secondary | ICD-10-CM

## 2022-01-21 NOTE — Progress Notes (Signed)
Labs order from 07/22/21 expired before patient could come back.   Will reorder CBC W/diff, CMP, JC Virus, Vit D

## 2022-01-24 ENCOUNTER — Other Ambulatory Visit (INDEPENDENT_AMBULATORY_CARE_PROVIDER_SITE_OTHER): Payer: Medicaid Other

## 2022-01-24 DIAGNOSIS — G35 Multiple sclerosis: Secondary | ICD-10-CM | POA: Diagnosis not present

## 2022-01-24 LAB — COMPREHENSIVE METABOLIC PANEL
ALT: 18 U/L (ref 0–53)
AST: 17 U/L (ref 0–37)
Albumin: 4.6 g/dL (ref 3.5–5.2)
Alkaline Phosphatase: 44 U/L (ref 39–117)
BUN: 14 mg/dL (ref 6–23)
CO2: 27 mEq/L (ref 19–32)
Calcium: 9.6 mg/dL (ref 8.4–10.5)
Chloride: 105 mEq/L (ref 96–112)
Creatinine, Ser: 1.01 mg/dL (ref 0.40–1.50)
GFR: 99.96 mL/min (ref >60.00)
Glucose, Bld: 83 mg/dL (ref 70–99)
Potassium: 4.4 mEq/L (ref 3.5–5.1)
Sodium: 141 mEq/L (ref 135–145)
Total Bilirubin: 0.3 mg/dL (ref 0.2–1.2)
Total Protein: 7.5 g/dL (ref 6.0–8.3)

## 2022-01-24 LAB — VITAMIN D 25 HYDROXY (VIT D DEFICIENCY, FRACTURES): VITD: 31.74 ng/mL (ref 30.00–100.00)

## 2022-01-25 LAB — CBC WITH DIFFERENTIAL
Basophils Absolute: 0.1 10*3/uL (ref 0.0–0.2)
Basos: 1 %
EOS (ABSOLUTE): 0.6 10*3/uL — ABNORMAL HIGH (ref 0.0–0.4)
Eos: 7 %
Hematocrit: 45.3 % (ref 37.5–51.0)
Hemoglobin: 14.4 g/dL (ref 13.0–17.7)
Immature Grans (Abs): 0.2 10*3/uL — ABNORMAL HIGH (ref 0.0–0.1)
Immature Granulocytes: 2 %
Lymphocytes Absolute: 3.6 10*3/uL — ABNORMAL HIGH (ref 0.7–3.1)
Lymphs: 43 %
MCH: 25.5 pg — ABNORMAL LOW (ref 26.6–33.0)
MCHC: 31.8 g/dL (ref 31.5–35.7)
MCV: 80 fL (ref 79–97)
Monocytes Absolute: 0.9 10*3/uL (ref 0.1–0.9)
Monocytes: 11 %
NRBC: 2 % — ABNORMAL HIGH (ref 0–0)
Neutrophils Absolute: 3.1 10*3/uL (ref 1.4–7.0)
Neutrophils: 36 %
RBC: 5.65 x10E6/uL (ref 4.14–5.80)
RDW: 14.2 % (ref 11.6–15.4)
WBC: 8.4 10*3/uL (ref 3.4–10.8)

## 2022-01-27 ENCOUNTER — Non-Acute Institutional Stay (HOSPITAL_COMMUNITY)
Admission: RE | Admit: 2022-01-27 | Discharge: 2022-01-27 | Disposition: A | Discharge status: Home / Self Care | Payer: Medicaid Other | Source: Ambulatory Visit | Attending: Internal Medicine | Admitting: Internal Medicine

## 2022-01-27 DIAGNOSIS — G35 Multiple sclerosis: Secondary | ICD-10-CM | POA: Diagnosis present

## 2022-01-27 MED ORDER — LORATADINE 10 MG PO TABS
10.0000 mg | ORAL_TABLET | Freq: Once | ORAL | Status: AC
  Administered 2022-01-27: 10 mg via ORAL
  Filled 2022-01-27: qty 1

## 2022-01-27 MED ORDER — ACETAMINOPHEN 325 MG PO TABS
650.0000 mg | ORAL_TABLET | Freq: Once | ORAL | Status: AC
  Administered 2022-01-27: 650 mg via ORAL
  Filled 2022-01-27: qty 2

## 2022-01-27 MED ORDER — SODIUM CHLORIDE 0.9 % IV SOLN
INTRAVENOUS | Status: DC | PRN
Start: 2022-01-27 — End: 2022-01-28

## 2022-01-27 MED ORDER — SODIUM CHLORIDE 0.9 % IV SOLN
300.0000 mg | INTRAVENOUS | Status: DC
  Administered 2022-01-27: 300 mg via INTRAVENOUS
  Filled 2022-01-27: qty 15

## 2022-01-27 NOTE — Progress Notes (Signed)
PATIENT CARE CENTER NOTE     Diagnosis: Multiple Sclerosis       Provider: Shon Millet MD     Procedure: Donnamarie Poag IV     Note: Patient received Tysabri infusion (dose 4 of 5) via PIV. Pre infusion medications (Tylenol and Claritin) given per order. Tolerated infusion well with no adverse reaction. Vital signs stable. Patient observed for 1 hour post-infusion.  Discharge instructions given to patient. Patient's next appointment scheduled for 02/24/2022.  Patient alert, oriented and ambulatory with walker at discharge. Patient taken to meet his transportation in the lobby.

## 2022-01-30 LAB — STRATIFY JCV AB (W/ INDEX) W/ RFLX
Index Value: 0.33 — ABNORMAL HIGH
Stratify JCV (TM) Ab w/Reflex Inhibition: UNDETERMINED — AB

## 2022-01-30 LAB — RFLX STRATIFY JCV (TM) AB INHIBITION: JCV Antibody by Inhibition: NEGATIVE
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
## 2022-02-01 ENCOUNTER — Encounter: Payer: Self-pay | Admitting: Neurology

## 2022-02-01 ENCOUNTER — Ambulatory Visit (INDEPENDENT_AMBULATORY_CARE_PROVIDER_SITE_OTHER): Payer: Medicaid Other | Admitting: Neurology

## 2022-02-01 VITALS — BP 125/80 | HR 69 | Ht 67.0 in | Wt 247.0 lb

## 2022-02-01 DIAGNOSIS — G35 Multiple sclerosis: Secondary | ICD-10-CM | POA: Diagnosis not present

## 2022-02-01 DIAGNOSIS — G40209 Localization-related (focal) (partial) symptomatic epilepsy and epileptic syndromes with complex partial seizures, not intractable, without status epilepticus: Secondary | ICD-10-CM

## 2022-02-01 DIAGNOSIS — S069X4D Unspecified intracranial injury with loss of consciousness of 6 hours to 24 hours, subsequent encounter: Secondary | ICD-10-CM

## 2022-02-01 NOTE — Addendum Note (Signed)
Addended by: Lenise Herald on: 02/01/2022 10:18 AM   Modules accepted: Orders

## 2022-02-01 NOTE — Patient Instructions (Signed)
Check MRI of brain and cervical spine with and without contrast in 6 months. Continue Tysabri, Keppra 500mg  twice daily, Depakote 500mg  twice daily, D3 50,000 IU weekly Check CBC with diff, CMP, JC Virus antibody with index in 6 months. Follow up in 6 months (after MRI and labs)

## 2022-02-24 ENCOUNTER — Encounter (HOSPITAL_COMMUNITY): Payer: Medicaid Other

## 2022-02-25 ENCOUNTER — Non-Acute Institutional Stay (HOSPITAL_COMMUNITY)
Admission: RE | Admit: 2022-02-25 | Discharge: 2022-02-25 | Disposition: A | Discharge status: Home / Self Care | Payer: Medicaid Other | Source: Ambulatory Visit | Attending: Internal Medicine | Admitting: Internal Medicine

## 2022-02-25 DIAGNOSIS — G35 Multiple sclerosis: Secondary | ICD-10-CM | POA: Diagnosis present

## 2022-02-25 MED ORDER — LORATADINE 10 MG PO TABS
10.0000 mg | ORAL_TABLET | Freq: Every day | ORAL | Status: DC
  Administered 2022-02-25: 10 mg via ORAL
  Filled 2022-02-25: qty 1

## 2022-02-25 MED ORDER — SODIUM CHLORIDE 0.9 % IV SOLN
300.0000 mg | INTRAVENOUS | Status: DC
  Administered 2022-02-25: 300 mg via INTRAVENOUS
  Filled 2022-02-25: qty 15

## 2022-02-25 MED ORDER — ACETAMINOPHEN 325 MG PO TABS
650.0000 mg | ORAL_TABLET | Freq: Once | ORAL | Status: AC
  Administered 2022-02-25: 650 mg via ORAL
  Filled 2022-02-25: qty 2

## 2022-02-25 MED ORDER — SODIUM CHLORIDE 0.9 % IV SOLN: INTRAVENOUS | Status: DC | PRN

## 2022-02-25 NOTE — Progress Notes (Signed)
PATIENT CARE CENTER NOTE:   Diagnosis: Multiple Sclerosis       Provider:  Shon Millet DO     Procedure: Tysabri 300mg  infusion     Note: Patient received Tysabri infusion ( dose #5 or 5)via PIV. Premedicated with Claritin and Tylenol PO per orders Tolerated infusion well with no adverse reaction. Vital signs stable. Discharge instructions given, pt RTC on 9/1, pt aware of date and time. Patient observed for 1 hour post infusion, no s/s reaction noted. Alert, oriented and ambulatory with walker at discharge, accompanied by 11/1.

## 2022-03-04 ENCOUNTER — Telehealth (HOSPITAL_COMMUNITY): Payer: Self-pay

## 2022-03-04 NOTE — Telephone Encounter (Signed)
Contacted Dr. Moises Blood clinical staff Milbert Coulter CMA via Epic secure chat, requesting new orders for Tysabri and premeds be placed prior to next infusion.

## 2022-03-07 ENCOUNTER — Other Ambulatory Visit: Payer: Self-pay

## 2022-03-07 DIAGNOSIS — G35 Multiple sclerosis: Secondary | ICD-10-CM

## 2022-03-07 NOTE — Progress Notes (Signed)
Standing order for Tysabri added for 6 months Per Last ov notes.

## 2022-03-24 ENCOUNTER — Other Ambulatory Visit: Payer: Self-pay

## 2022-03-24 NOTE — Addendum Note (Signed)
Addended by: Leida Lauth on: 03/24/2022 03:30 PM   Modules accepted: Orders

## 2022-03-25 ENCOUNTER — Non-Acute Institutional Stay (HOSPITAL_COMMUNITY)
Admission: RE | Admit: 2022-03-25 | Discharge: 2022-03-25 | Disposition: A | Discharge status: Home / Self Care | Payer: Medicaid Other | Source: Ambulatory Visit | Attending: Internal Medicine | Admitting: Internal Medicine

## 2022-03-25 DIAGNOSIS — G35 Multiple sclerosis: Secondary | ICD-10-CM | POA: Insufficient documentation

## 2022-03-25 MED ORDER — ACETAMINOPHEN ER 650 MG PO TBCR: 650.0000 mg | EXTENDED_RELEASE_TABLET | Freq: Once | ORAL | Status: DC

## 2022-03-25 MED ORDER — SODIUM CHLORIDE 0.9 % IV SOLN
INTRAVENOUS | Status: DC | PRN
Start: 2022-03-25 — End: 2022-03-26

## 2022-03-25 MED ORDER — LORATADINE 10 MG PO TABS
10.0000 mg | ORAL_TABLET | Freq: Once | ORAL | Status: AC
  Administered 2022-03-25: 10 mg via ORAL
  Filled 2022-03-25: qty 1

## 2022-03-25 MED ORDER — ACETAMINOPHEN 325 MG PO TABS
650.0000 mg | ORAL_TABLET | Freq: Once | ORAL | Status: AC
  Administered 2022-03-25: 650 mg via ORAL
  Filled 2022-03-25: qty 2

## 2022-03-25 MED ORDER — SODIUM CHLORIDE 0.9 % IV SOLN
300.0000 mg | INTRAVENOUS | Status: DC
  Administered 2022-03-25: 300 mg via INTRAVENOUS
  Filled 2022-03-25: qty 15

## 2022-03-25 NOTE — Progress Notes (Signed)
PATIENT CARE CENTER NOTE     Diagnosis: Multiple Sclerosis       Provider: Shon Millet MD     Procedure: Donnamarie Poag IV     Note: Patient received Tysabri infusion (dose 1 of 6) via PIV. Pre infusion medications (Tylenol and Claritin) given per order. Tolerated infusion well with no adverse reaction. Vital signs stable. Patient observed for 1 hour post-infusion.  Discharge instructions given to patient. Patient's next appointment scheduled for 04/22/2022.  Patient alert, oriented and ambulatory with walker at discharge. Patient taken to meet his transportation in the lobby.

## 2022-04-22 ENCOUNTER — Non-Acute Institutional Stay (HOSPITAL_COMMUNITY)
Admission: RE | Admit: 2022-04-22 | Discharge: 2022-04-22 | Disposition: A | Discharge status: Home / Self Care | Payer: Medicaid Other | Source: Ambulatory Visit | Attending: Internal Medicine | Admitting: Internal Medicine

## 2022-04-22 DIAGNOSIS — G35 Multiple sclerosis: Secondary | ICD-10-CM | POA: Insufficient documentation

## 2022-04-22 MED ORDER — SODIUM CHLORIDE 0.9 % IV SOLN: INTRAVENOUS | Status: DC | PRN

## 2022-04-22 MED ORDER — LORATADINE 10 MG PO TABS
10.0000 mg | ORAL_TABLET | Freq: Every day | ORAL | Status: DC
  Administered 2022-04-22: 10 mg via ORAL
  Filled 2022-04-22: qty 1

## 2022-04-22 MED ORDER — SODIUM CHLORIDE 0.9 % IV SOLN
300.0000 mg | INTRAVENOUS | Status: DC
  Administered 2022-04-22: 300 mg via INTRAVENOUS
  Filled 2022-04-22: qty 15

## 2022-04-22 MED ORDER — ACETAMINOPHEN 325 MG PO TABS
650.0000 mg | ORAL_TABLET | Freq: Once | ORAL | Status: AC
  Administered 2022-04-22: 650 mg via ORAL
  Filled 2022-04-22: qty 2

## 2022-04-22 NOTE — Progress Notes (Signed)
PATIENT CARE CENTER NOTE  Diagnosis: Multiple Sclerosis       Provider: Metta Clines MD     Procedure: Tysabri 300 mg     Note: Patient received Tysabri infusion (dose 2 of 6) via PIV. Pre infusion medications (Tylenol and Claritin) given per order. Tolerated infusion well with no adverse reaction. Vital signs stable. Patient observed for 1 hour post-infusion.  Discharge instructions given to patient. Patient alert, and using wheelchair at discharge.

## 2022-05-20 ENCOUNTER — Non-Acute Institutional Stay (HOSPITAL_COMMUNITY)
Admission: RE | Admit: 2022-05-20 | Discharge: 2022-05-20 | Disposition: A | Discharge status: Home / Self Care | Payer: Medicaid Other | Source: Ambulatory Visit | Attending: Internal Medicine | Admitting: Internal Medicine

## 2022-05-20 DIAGNOSIS — G35 Multiple sclerosis: Secondary | ICD-10-CM | POA: Insufficient documentation

## 2022-05-20 MED ORDER — SODIUM CHLORIDE 0.9 % IV SOLN
300.0000 mg | INTRAVENOUS | Status: DC
  Administered 2022-05-20: 300 mg via INTRAVENOUS
  Filled 2022-05-20: qty 15

## 2022-05-20 MED ORDER — LORATADINE 10 MG PO TABS
10.0000 mg | ORAL_TABLET | ORAL | Status: DC
  Administered 2022-05-20: 10 mg via ORAL
  Filled 2022-05-20: qty 1

## 2022-05-20 MED ORDER — SODIUM CHLORIDE 0.9 % IV SOLN
INTRAVENOUS | Status: DC | PRN
Start: 2022-05-20 — End: 2022-05-21

## 2022-05-20 MED ORDER — ACETAMINOPHEN 325 MG PO TABS
650.0000 mg | ORAL_TABLET | ORAL | Status: DC
  Administered 2022-05-20: 650 mg via ORAL
  Filled 2022-05-20: qty 2

## 2022-05-20 NOTE — Progress Notes (Signed)
PATIENT CARE CENTER NOTE  Diagnosis: Multiple Sclerosis   Provider: Metta Clines MD  Procedure: Tysabri 300 mg  Note: Patient received Tysabri 300 mg infusion (dose #3 of 6) via PIV. Pt pre medicated with Tylenol 650 mg and Claritin 10 mg per orders. Tolerated infusion well with no adverse reaction. Vital signs stable. Discharge instructions given. Patient stayed for the one hour observation post infusion.Patient advised to schedule next appointment at front desk. Alert, oriented and ambulatory at discharge.

## 2022-06-20 ENCOUNTER — Encounter (HOSPITAL_COMMUNITY): Payer: Medicaid Other

## 2022-06-22 ENCOUNTER — Non-Acute Institutional Stay (HOSPITAL_COMMUNITY)
Admission: RE | Admit: 2022-06-22 | Discharge: 2022-06-22 | Disposition: A | Discharge status: Home / Self Care | Payer: Medicaid Other | Source: Ambulatory Visit | Attending: Internal Medicine | Admitting: Internal Medicine

## 2022-06-22 DIAGNOSIS — G35 Multiple sclerosis: Secondary | ICD-10-CM | POA: Diagnosis not present

## 2022-06-22 MED ORDER — SODIUM CHLORIDE 0.9 % IV SOLN
300.0000 mg | Freq: Once | INTRAVENOUS | Status: AC
  Administered 2022-06-22: 300 mg via INTRAVENOUS
  Filled 2022-06-22: qty 15

## 2022-06-22 MED ORDER — SODIUM CHLORIDE 0.9 % IV SOLN: INTRAVENOUS | Status: DC | PRN

## 2022-06-22 MED ORDER — LORATADINE 10 MG PO TABS
10.0000 mg | ORAL_TABLET | Freq: Every day | ORAL | Status: DC
  Administered 2022-06-22: 10 mg via ORAL
  Filled 2022-06-22: qty 1

## 2022-06-22 MED ORDER — ACETAMINOPHEN 325 MG PO TABS
650.0000 mg | ORAL_TABLET | Freq: Once | ORAL | Status: AC
  Administered 2022-06-22: 650 mg via ORAL
  Filled 2022-06-22: qty 2

## 2022-06-22 NOTE — Progress Notes (Signed)
PATIENT CARE CENTER NOTE   Diagnosis: Multiple Sclerosis       Provider: Shon Millet DO     Procedure: Tysabri 300mg  infusion     Note: Patient received Tysabri infusion  ( dose #4 of 6) via PIV. Pt premedicated with Tylenol and Claritin PO per orders. Tolerated infusion well with no adverse reaction. Vital signs stable. Discharge instructions given, pt to RTC on 12/27, made aware and verbalized understanding. Patient observed for the 1 hour post-infusion , no s/s reaction noted. Alert, oriented and ambulatory  with walker at discharge, accompanied by 1/28.

## 2022-07-20 ENCOUNTER — Non-Acute Institutional Stay (HOSPITAL_COMMUNITY)
Admission: RE | Admit: 2022-07-20 | Discharge: 2022-07-20 | Disposition: A | Discharge status: Home / Self Care | Payer: Medicaid Other | Source: Ambulatory Visit | Attending: Internal Medicine | Admitting: Internal Medicine

## 2022-07-20 DIAGNOSIS — G35 Multiple sclerosis: Secondary | ICD-10-CM | POA: Insufficient documentation

## 2022-07-20 MED ORDER — LORATADINE 10 MG PO TABS
10.0000 mg | ORAL_TABLET | Freq: Every day | ORAL | Status: DC
  Administered 2022-07-20: 10 mg via ORAL
  Filled 2022-07-20: qty 1

## 2022-07-20 MED ORDER — ACETAMINOPHEN 325 MG PO TABS
650.0000 mg | ORAL_TABLET | Freq: Once | ORAL | Status: AC
  Administered 2022-07-20: 650 mg via ORAL
  Filled 2022-07-20: qty 2

## 2022-07-20 MED ORDER — SODIUM CHLORIDE 0.9 % IV SOLN: INTRAVENOUS | Status: DC | PRN

## 2022-07-20 MED ORDER — SODIUM CHLORIDE 0.9 % IV SOLN
300.0000 mg | INTRAVENOUS | Status: DC
  Administered 2022-07-20: 300 mg via INTRAVENOUS
  Filled 2022-07-20: qty 15

## 2022-07-20 NOTE — Progress Notes (Signed)
PATIENT CARE CENTER NOTE:   Diagnosis: Multiple Sclerosis       Provider: Shon Millet DO     Procedure: Tysabri 300mg  infusion     Note: Patient received Tysabri infusion ( dose #5 of 6) via PIV. Premedicated with Claritin and Tylenol PO per orders. Tolerated infusion well with no adverse reaction. Vital signs stable. Discharge instructions given. Patient observed for 1 hour post -infusion, no s/s reaction noted. Pt to RTC on 1/24 , provided with date and time of appointment, verbalized understanding.  Alert, oriented and ambulatory at discharge with walker, accompanied by 2/24.

## 2022-08-04 ENCOUNTER — Inpatient Hospital Stay: Admission: RE | Admit: 2022-08-04 | Payer: Medicaid Other | Source: Ambulatory Visit

## 2022-08-04 ENCOUNTER — Other Ambulatory Visit: Payer: Medicaid Other
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
## 2022-08-09 ENCOUNTER — Ambulatory Visit: Payer: Medicaid Other | Admitting: Neurology

## 2022-08-09 ENCOUNTER — Encounter: Payer: Self-pay | Admitting: Neurology
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
## 2022-08-11 ENCOUNTER — Ambulatory Visit: Payer: Medicaid Other | Admitting: Neurology

## 2022-08-11 ENCOUNTER — Encounter: Payer: Self-pay | Admitting: Neurology

## 2022-08-11 ENCOUNTER — Other Ambulatory Visit (INDEPENDENT_AMBULATORY_CARE_PROVIDER_SITE_OTHER): Payer: Medicaid Other

## 2022-08-11 ENCOUNTER — Ambulatory Visit (INDEPENDENT_AMBULATORY_CARE_PROVIDER_SITE_OTHER): Payer: Medicaid Other | Admitting: Neurology

## 2022-08-11 VITALS — BP 132/73 | HR 96 | Ht 68.0 in | Wt 250.0 lb

## 2022-08-11 DIAGNOSIS — S069X4D Unspecified intracranial injury with loss of consciousness of 6 hours to 24 hours, subsequent encounter: Secondary | ICD-10-CM

## 2022-08-11 DIAGNOSIS — G40909 Epilepsy, unspecified, not intractable, without status epilepticus: Secondary | ICD-10-CM

## 2022-08-11 DIAGNOSIS — F209 Schizophrenia, unspecified: Secondary | ICD-10-CM | POA: Diagnosis not present

## 2022-08-11 DIAGNOSIS — G35 Multiple sclerosis: Secondary | ICD-10-CM

## 2022-08-11 DIAGNOSIS — W3400XA Accidental discharge from unspecified firearms or gun, initial encounter: Secondary | ICD-10-CM | POA: Diagnosis not present

## 2022-08-11 DIAGNOSIS — G40209 Localization-related (focal) (partial) symptomatic epilepsy and epileptic syndromes with complex partial seizures, not intractable, without status epilepticus: Secondary | ICD-10-CM

## 2022-08-11 DIAGNOSIS — F259 Schizoaffective disorder, unspecified: Secondary | ICD-10-CM

## 2022-08-11 LAB — COMPREHENSIVE METABOLIC PANEL
ALT: 14 U/L (ref 0–53)
AST: 18 U/L (ref 0–37)
Albumin: 4.5 g/dL (ref 3.5–5.2)
Alkaline Phosphatase: 43 U/L (ref 39–117)
BUN: 14 mg/dL (ref 6–23)
CO2: 32 mEq/L (ref 19–32)
Calcium: 9.5 mg/dL (ref 8.4–10.5)
Chloride: 104 mEq/L (ref 96–112)
Creatinine, Ser: 1 mg/dL (ref 0.40–1.50)
GFR: 100.78 mL/min (ref >60.00)
Glucose, Bld: 77 mg/dL (ref 70–99)
Potassium: 4.1 mEq/L (ref 3.5–5.1)
Sodium: 143 mEq/L (ref 135–145)
Total Bilirubin: 0.2 mg/dL (ref 0.2–1.2)
Total Protein: 7.6 g/dL (ref 6.0–8.3)

## 2022-08-11 LAB — CBC WITH DIFFERENTIAL/PLATELET
Basophils Absolute: 0.1 10*3/uL (ref 0.0–0.1)
Basophils Relative: 0.8 % (ref 0.0–3.0)
Eosinophils Absolute: 0.6 10*3/uL (ref 0.0–0.7)
Eosinophils Relative: 6.2 % — ABNORMAL HIGH (ref 0.0–5.0)
HCT: 45.7 % (ref 39.0–52.0)
Hemoglobin: 14.6 g/dL (ref 13.0–17.0)
Lymphocytes Relative: 38.2 % (ref 12.0–46.0)
Lymphs Abs: 3.9 10*3/uL (ref 0.7–4.0)
MCHC: 32 g/dL (ref 30.0–36.0)
MCV: 81 fl (ref 78.0–100.0)
Monocytes Absolute: 0.8 10*3/uL (ref 0.1–1.0)
Monocytes Relative: 7.9 % (ref 3.0–12.0)
Neutro Abs: 4.7 10*3/uL (ref 1.4–7.7)
Neutrophils Relative %: 46.9 % (ref 43.0–77.0)
Platelets: 225 10*3/uL (ref 150.0–400.0)
RBC: 5.65 Mil/uL (ref 4.22–5.81)
RDW: 14.9 % (ref 11.5–15.5)
WBC: 10.1 10*3/uL (ref 4.0–10.5)

## 2022-08-11 MED ORDER — DIVALPROEX SODIUM 500 MG PO DR TAB: 500.0000 mg | DELAYED_RELEASE_TABLET | Freq: Two times a day (BID) | ORAL | 5 refills | Status: DC

## 2022-08-11 MED ORDER — LEVETIRACETAM 500 MG PO TABS: 500.0000 mg | ORAL_TABLET | Freq: Two times a day (BID) | ORAL | 5 refills | Status: DC

## 2022-08-11 NOTE — Patient Instructions (Signed)
Check MRI of brain and cervical spine with and without contrast Continue levetiracetam 500mg  twice daily and divalproex 500mg  twice daily Increase  D3 to 4000 IU daily Check CBC with diff, CMP, vit D and valproic acid level in 6 months Follow up in 6 months.

## 2022-08-17 ENCOUNTER — Non-Acute Institutional Stay (HOSPITAL_COMMUNITY)
Admission: RE | Admit: 2022-08-17 | Discharge: 2022-08-17 | Disposition: A | Discharge status: Home / Self Care | Payer: Medicaid Other | Source: Ambulatory Visit | Attending: Internal Medicine | Admitting: Internal Medicine

## 2022-08-17 DIAGNOSIS — G35 Multiple sclerosis: Secondary | ICD-10-CM | POA: Insufficient documentation

## 2022-08-17 MED ORDER — SODIUM CHLORIDE 0.9 % IV SOLN
300.0000 mg | Freq: Once | INTRAVENOUS | Status: AC
  Administered 2022-08-17: 300 mg via INTRAVENOUS
  Filled 2022-08-17: qty 15

## 2022-08-17 MED ORDER — LORATADINE 10 MG PO TABS
10.0000 mg | ORAL_TABLET | Freq: Every day | ORAL | Status: DC
  Administered 2022-08-17: 10 mg via ORAL
  Filled 2022-08-17: qty 1

## 2022-08-17 MED ORDER — ACETAMINOPHEN 325 MG PO TABS
650.0000 mg | ORAL_TABLET | Freq: Once | ORAL | Status: AC
  Administered 2022-08-17: 650 mg via ORAL
  Filled 2022-08-17: qty 2

## 2022-08-17 MED ORDER — SODIUM CHLORIDE 0.9 % IV SOLN: INTRAVENOUS | Status: DC | PRN

## 2022-08-17 NOTE — Progress Notes (Signed)
PATIENT CARE CENTER NOTE  Diagnosis: Multiple Sclerosis    Provider: Metta Clines, DO  Procedure: Tysabri 300 mg infusion  Note: Patient received Tysabri infusion (dose #6 of 6) via PIV. Pt pre medicated with Claritin and Tylenol PO per orders. Pt tolerated infusion well with no adverse reaction. Vital signs stable. Discharge instructions given. Patient observed for 1 hour post infusion. Next appointment scheduled for 09/14/2022 pt advised and verbalized understanding. Alert, oriented and ambulatory with personal walker at discharge. Pt discharged home with uncle.

## 2022-08-19 ENCOUNTER — Other Ambulatory Visit: Payer: Self-pay

## 2022-08-19 DIAGNOSIS — G35 Multiple sclerosis: Secondary | ICD-10-CM

## 2022-08-24 LAB — STRATIFY JCV(TM) AB W/INDEX
JCV Antibody by Inhibition: NEGATIVE
JCV Index Value: 0.29

## 2022-08-24 LAB — SPECIMEN STATUS REPORT

## 2022-09-14 ENCOUNTER — Non-Acute Institutional Stay (HOSPITAL_COMMUNITY)
Admission: RE | Admit: 2022-09-14 | Discharge: 2022-09-14 | Disposition: A | Discharge status: Home / Self Care | Payer: Medicaid Other | Source: Ambulatory Visit | Attending: Internal Medicine | Admitting: Internal Medicine

## 2022-09-14 DIAGNOSIS — G35 Multiple sclerosis: Secondary | ICD-10-CM | POA: Insufficient documentation

## 2022-09-14 MED ORDER — SODIUM CHLORIDE 0.9 % IV SOLN
300.0000 mg | INTRAVENOUS | Status: DC
  Administered 2022-09-14: 300 mg via INTRAVENOUS
  Filled 2022-09-14: qty 15

## 2022-09-14 MED ORDER — LORATADINE 10 MG PO TABS
10.0000 mg | ORAL_TABLET | ORAL | Status: DC
  Administered 2022-09-14: 10 mg via ORAL
  Filled 2022-09-14: qty 1

## 2022-09-14 MED ORDER — ACETAMINOPHEN 325 MG PO TABS
650.0000 mg | ORAL_TABLET | Freq: Three times a day (TID) | ORAL | Status: DC
  Administered 2022-09-14: 650 mg via ORAL
  Filled 2022-09-14: qty 2

## 2022-09-14 MED ORDER — SODIUM CHLORIDE 0.9 % IV SOLN: INTRAVENOUS | Status: DC | PRN

## 2022-09-14 NOTE — Progress Notes (Signed)
PATIENT CARE CENTER NOTE  Diagnosis: Multiple sclerosis ([place]) [G35]    Provider: Metta Clines  Procedure: Tysabri 300 mg (#1 of 6)  Note: Patient received Tysabri 300 mg infusion via PIV. Pt premedicated per orders with PO Tylenol and Claritin. Pt tolerated infusion with no issues. Pt observed for 1 hour post infusion. Vital signs stable. AVS given to pt. Pt scheduled for next appointment on 10/12/2022. Pt is alert, oriented, and ambulatory with personal walker at discharge. Pt discharged home with his Uncle.

## 2022-09-20 ENCOUNTER — Ambulatory Visit: Payer: Medicaid Other

## 2022-10-12 ENCOUNTER — Non-Acute Institutional Stay (HOSPITAL_COMMUNITY)
Admission: RE | Admit: 2022-10-12 | Discharge: 2022-10-12 | Disposition: A | Discharge status: Home / Self Care | Payer: Medicaid Other | Source: Ambulatory Visit | Attending: Internal Medicine | Admitting: Internal Medicine

## 2022-10-12 DIAGNOSIS — G35 Multiple sclerosis: Secondary | ICD-10-CM | POA: Diagnosis present

## 2022-10-12 MED ORDER — SODIUM CHLORIDE 0.9 % IV SOLN
300.0000 mg | INTRAVENOUS | Status: DC
  Administered 2022-10-12: 300 mg via INTRAVENOUS
  Filled 2022-10-12: qty 15

## 2022-10-12 MED ORDER — ACETAMINOPHEN 325 MG PO TABS
650.0000 mg | ORAL_TABLET | Freq: Once | ORAL | Status: AC
  Administered 2022-10-12: 650 mg via ORAL
  Filled 2022-10-12: qty 2

## 2022-10-12 MED ORDER — LORATADINE 10 MG PO TABS
10.0000 mg | ORAL_TABLET | Freq: Every day | ORAL | Status: DC
  Administered 2022-10-12: 10 mg via ORAL
  Filled 2022-10-12: qty 1

## 2022-10-12 MED ORDER — SODIUM CHLORIDE 0.9 % IV SOLN: INTRAVENOUS | Status: DC | PRN

## 2022-10-12 NOTE — Progress Notes (Signed)
PATIENT CARE CENTER NOTE  Diagnosis: Multiple sclerosis ([place]) [G35]    Provider: Metta Clines DO  Procedure: Tysabri 300 mg infusion  Note: Patient received Tysabri 300 mg infusion (dose #2 of 6) via PIV. Pt pre-medicated per orders with PO Tylenol and Claritin. Tolerated infusion well with no adverse reaction. Pt observed for 1 hour post infusion. Vital signs stable. Discharge instructions given. Pt next appointment scheduled for 11/09/2022. Pt is alert, oriented, and ambulatory with personal walker at discharge.

## 2022-11-09 ENCOUNTER — Non-Acute Institutional Stay (HOSPITAL_COMMUNITY)
Admission: RE | Admit: 2022-11-09 | Discharge: 2022-11-09 | Disposition: A | Discharge status: Home / Self Care | Payer: Medicaid Other | Source: Ambulatory Visit | Attending: Internal Medicine | Admitting: Internal Medicine

## 2022-11-09 DIAGNOSIS — G35 Multiple sclerosis: Secondary | ICD-10-CM | POA: Insufficient documentation

## 2022-11-09 MED ORDER — SODIUM CHLORIDE 0.9 % IV SOLN
300.0000 mg | INTRAVENOUS | Status: DC
  Administered 2022-11-09: 300 mg via INTRAVENOUS
  Filled 2022-11-09: qty 15

## 2022-11-09 MED ORDER — LORATADINE 10 MG PO TABS
10.0000 mg | ORAL_TABLET | Freq: Every day | ORAL | Status: DC
  Administered 2022-11-09: 10 mg via ORAL
  Filled 2022-11-09: qty 1

## 2022-11-09 MED ORDER — SODIUM CHLORIDE 0.9 % IV SOLN: INTRAVENOUS | Status: DC | PRN

## 2022-11-09 MED ORDER — ACETAMINOPHEN 325 MG PO TABS
650.0000 mg | ORAL_TABLET | Freq: Once | ORAL | Status: AC
  Administered 2022-11-09: 650 mg via ORAL
  Filled 2022-11-09: qty 2

## 2022-11-09 NOTE — Progress Notes (Signed)
CENTER NOTE   Diagnosis: Multiple Sclerosis ( HCC) [G35]      Provider:  Shon Millet MD     Procedure: Donnamarie Poag  infusion     Note: Patient received Tysabri infusion ( dose #3 of 6)  via PIV. Premedicated with Tylenol and Claritin PO per orders. BP 147/88 prior to infusion, pt unsure if he took BP meds this morning.  Dr. Moises Blood CMA Zenon Mayo made aware. Per MD, administer infusion today. Tolerated infusion well with no adverse reaction. Vital signs stable, post infusion BP 135/82. Discharge instructions given, pt to RTC on 12/07/22 for next infusion, pt made aware and verbalized understanding. Patient observed for 1 hour post infusion, no s/s reaction noted.  Alert, oriented and ambulatory  with rolling walker at discharge, accompanied by Lutricia Feil.

## 2022-12-07 ENCOUNTER — Non-Acute Institutional Stay (HOSPITAL_COMMUNITY)
Admission: RE | Admit: 2022-12-07 | Discharge: 2022-12-07 | Disposition: A | Discharge status: Home / Self Care | Payer: Medicaid Other | Source: Ambulatory Visit | Attending: Internal Medicine | Admitting: Internal Medicine

## 2022-12-07 DIAGNOSIS — G35 Multiple sclerosis: Secondary | ICD-10-CM | POA: Insufficient documentation

## 2022-12-07 MED ORDER — SODIUM CHLORIDE 0.9 % IV SOLN
300.0000 mg | INTRAVENOUS | Status: DC
  Administered 2022-12-07: 300 mg via INTRAVENOUS
  Filled 2022-12-07: qty 15

## 2022-12-07 MED ORDER — SODIUM CHLORIDE 0.9 % IV SOLN: INTRAVENOUS | Status: DC | PRN

## 2022-12-07 MED ORDER — ACETAMINOPHEN 325 MG PO TABS
650.0000 mg | ORAL_TABLET | Freq: Once | ORAL | Status: AC
  Administered 2022-12-07: 650 mg via ORAL
  Filled 2022-12-07: qty 2

## 2022-12-07 MED ORDER — LORATADINE 10 MG PO TABS
10.0000 mg | ORAL_TABLET | Freq: Every day | ORAL | Status: DC
  Administered 2022-12-07: 10 mg via ORAL
  Filled 2022-12-07: qty 1

## 2022-12-07 NOTE — Progress Notes (Signed)
PATIENT CARE CENTER NOTE   Diagnosis: Multiple Sclerosis ( HCC) [G35]      Provider:  Shon Millet MD     Procedure: Tysabri 300mg  infusion     Note: Patient received Tysabri infusion ( dose #4 of 6) via PIV. Pripor to infusion, pt had BP 144/88, pt states he thinks he took his BP meds but unsure, Dr. Everlena Cooper notified, instructed to proceed with administration of tysabri today, no further intervention required. Premedicated with Tylenol and Claritin PO. Tolerated infusion well with no adverse reaction. Vital signs stable. Discharge instructions given. Patient observed for 1 hour post infusion, no s/s reaction noted. . Alert, oriented and ambulatory at discharge with rolling walker from home  , accompanied by Lutricia Feil.

## 2022-12-15 ENCOUNTER — Ambulatory Visit: Payer: Medicaid Other | Admitting: Neurology

## 2023-01-04 ENCOUNTER — Non-Acute Institutional Stay (HOSPITAL_COMMUNITY)
Admission: RE | Admit: 2023-01-04 | Discharge: 2023-01-04 | Disposition: A | Discharge status: Home / Self Care | Payer: Medicaid Other | Source: Ambulatory Visit | Attending: Internal Medicine | Admitting: Internal Medicine

## 2023-01-04 DIAGNOSIS — G35 Multiple sclerosis: Secondary | ICD-10-CM | POA: Insufficient documentation

## 2023-01-04 MED ORDER — SODIUM CHLORIDE 0.9 % IV SOLN: INTRAVENOUS | Status: DC | PRN

## 2023-01-04 MED ORDER — ACETAMINOPHEN 325 MG PO TABS
650.0000 mg | ORAL_TABLET | Freq: Once | ORAL | Status: AC
  Administered 2023-01-04: 650 mg via ORAL
  Filled 2023-01-04: qty 2

## 2023-01-04 MED ORDER — SODIUM CHLORIDE 0.9 % IV SOLN
300.0000 mg | INTRAVENOUS | Status: DC
  Administered 2023-01-04: 300 mg via INTRAVENOUS
  Filled 2023-01-04: qty 15

## 2023-01-04 MED ORDER — LORATADINE 10 MG PO TABS
10.0000 mg | ORAL_TABLET | Freq: Once | ORAL | Status: AC
  Administered 2023-01-04: 10 mg via ORAL
  Filled 2023-01-04: qty 1

## 2023-01-04 NOTE — Progress Notes (Signed)
PATIENT CARE CENTER NOTE   Diagnosis: Multiple Sclerosis ( HCC) [G35]      Provider: Shon Millet, MD     Procedure: Tysabri 300mg  infusion     Note: Patient received Tysabri infusion ( dose #5 of 6) via PIV. Premedicated with Claritin and Tylenol PO per orders. Tolerated infusion well with no adverse reaction. Vital signs stable. Clinical staff Milbert Coulter CMA notified via secure chat that SBP was in the 140s today throughout infusion, this frequently occurs when patient receives infusion, pt voices no complaints and tolerated infusion well. Pt states he takes BP meds in the morning and evening. Discharge instructions given, pt to RTC 02/01/2023, provided with date and time, verbalized understanding. Patient observed for 1 hour post infusion, no s/s reaction noted. Alert, oriented and ambulatory with walker at discharge, accompanied by his Lutricia Feil.

## 2023-02-01 ENCOUNTER — Non-Acute Institutional Stay (HOSPITAL_COMMUNITY)
Admission: RE | Admit: 2023-02-01 | Discharge: 2023-02-01 | Disposition: A | Discharge status: Home / Self Care | Payer: MEDICAID | Source: Ambulatory Visit | Attending: Internal Medicine | Admitting: Internal Medicine

## 2023-02-01 DIAGNOSIS — G35 Multiple sclerosis: Secondary | ICD-10-CM | POA: Diagnosis not present

## 2023-02-01 MED ORDER — SODIUM CHLORIDE 0.9 % IV SOLN: INTRAVENOUS | Status: DC | PRN

## 2023-02-01 MED ORDER — SODIUM CHLORIDE 0.9 % IV SOLN
300.0000 mg | Freq: Once | INTRAVENOUS | Status: AC
  Administered 2023-02-01: 300 mg via INTRAVENOUS
  Filled 2023-02-01: qty 15

## 2023-02-01 MED ORDER — ACETAMINOPHEN 325 MG PO TABS
650.0000 mg | ORAL_TABLET | Freq: Once | ORAL | Status: AC
  Administered 2023-02-01: 650 mg via ORAL
  Filled 2023-02-01: qty 2

## 2023-02-01 MED ORDER — LORATADINE 10 MG PO TABS
10.0000 mg | ORAL_TABLET | Freq: Every day | ORAL | Status: DC
  Administered 2023-02-01: 10 mg via ORAL
  Filled 2023-02-01: qty 1

## 2023-02-01 NOTE — Progress Notes (Signed)
PATIENT CARE CENTER NOTE   Diagnosis: Multiple Sclerosis  ( HCC) [G35]      Provider: Shon Millet MD     Procedure: Donnamarie Poag 300mg  infusion     Note: Patient received Tysabri infusion ( # 6 of 6) via PIV. Premedicated with Claritin and Tylenol PO per orders. Tolerated infusion well with no adverse reaction. Vital signs stable. Discharge instructions given. Pt to RTC on 8/7, provided with date and time and verbalized understanding. Patient observed for 1 hour post infusion, no s/s reaction noted. Alert, oriented and ambulatory with walker at discharge, accompanied by Lutricia Feil.

## 2023-02-02 ENCOUNTER — Other Ambulatory Visit: Payer: MEDICAID

## 2023-02-02 DIAGNOSIS — G35 Multiple sclerosis: Secondary | ICD-10-CM

## 2023-02-02 LAB — CBC WITH DIFFERENTIAL/PLATELET
Basophils Absolute: 0.1 10*3/uL (ref 0.0–0.1)
Basophils Relative: 0.8 % (ref 0.0–3.0)
Eosinophils Absolute: 0.6 10*3/uL (ref 0.0–0.7)
Eosinophils Relative: 6 % — ABNORMAL HIGH (ref 0.0–5.0)
HCT: 43.7 % (ref 39.0–52.0)
Hemoglobin: 13.8 g/dL (ref 13.0–17.0)
Lymphocytes Relative: 45.4 % (ref 12.0–46.0)
Lymphs Abs: 4.3 10*3/uL — ABNORMAL HIGH (ref 0.7–4.0)
MCHC: 31.5 g/dL (ref 30.0–36.0)
MCV: 83.9 fl (ref 78.0–100.0)
Monocytes Absolute: 0.8 10*3/uL (ref 0.1–1.0)
Monocytes Relative: 8 % (ref 3.0–12.0)
Neutro Abs: 3.8 10*3/uL (ref 1.4–7.7)
Neutrophils Relative %: 39.8 % — ABNORMAL LOW (ref 43.0–77.0)
Platelets: 152 10*3/uL (ref 150.0–400.0)
RBC: 5.21 Mil/uL (ref 4.22–5.81)
RDW: 15.4 % (ref 11.5–15.5)
WBC: 9.5 10*3/uL (ref 4.0–10.5)

## 2023-02-02 LAB — VITAMIN D 25 HYDROXY (VIT D DEFICIENCY, FRACTURES): VITD: 37.21 ng/mL (ref 30.00–100.00)

## 2023-02-03 LAB — VALPROIC ACID LEVEL: Valproic Acid Lvl: 86.8 mg/L (ref 50.0–100.0)
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
## 2023-02-09 ENCOUNTER — Encounter: Payer: Self-pay | Admitting: Neurology

## 2023-02-09 ENCOUNTER — Ambulatory Visit: Payer: Medicaid Other | Admitting: Neurology

## 2023-02-13 ENCOUNTER — Telehealth: Payer: Self-pay | Admitting: Neurology

## 2023-02-13 NOTE — Telephone Encounter (Signed)
Advised patient uncle patient needs a MRI Brain and Cervical  Spine.

## 2023-02-13 NOTE — Telephone Encounter (Signed)
Patients uncle called wanting to know if the MRI is for the brain or spine/KB

## 2023-02-14 ENCOUNTER — Telehealth: Payer: Self-pay | Admitting: Neurology

## 2023-02-24 ENCOUNTER — Other Ambulatory Visit: Payer: Self-pay

## 2023-02-24 NOTE — Progress Notes (Unsigned)
Per Dr.Jaffe okay to add order for tysabri.

## 2023-03-01 ENCOUNTER — Encounter (HOSPITAL_COMMUNITY): Payer: MEDICAID

## 2023-03-07 ENCOUNTER — Non-Acute Institutional Stay (HOSPITAL_COMMUNITY)
Admission: RE | Admit: 2023-03-07 | Discharge: 2023-03-07 | Disposition: A | Discharge status: Home / Self Care | Payer: MEDICAID | Source: Ambulatory Visit | Attending: Internal Medicine | Admitting: Internal Medicine

## 2023-03-07 DIAGNOSIS — G35 Multiple sclerosis: Secondary | ICD-10-CM | POA: Insufficient documentation

## 2023-03-07 MED ORDER — ACETAMINOPHEN ER 650 MG PO TBCR: 650.0000 mg | EXTENDED_RELEASE_TABLET | ORAL | Status: DC

## 2023-03-07 MED ORDER — SODIUM CHLORIDE 0.9 % IV SOLN
300.0000 mg | Freq: Once | INTRAVENOUS | Status: AC
  Administered 2023-03-07: 300 mg via INTRAVENOUS
  Filled 2023-03-07: qty 15

## 2023-03-07 MED ORDER — LORATADINE 10 MG PO TABS
10.0000 mg | ORAL_TABLET | ORAL | Status: DC
  Administered 2023-03-07: 10 mg via ORAL
  Filled 2023-03-07: qty 1

## 2023-03-07 MED ORDER — SODIUM CHLORIDE 0.9 % IV SOLN: INTRAVENOUS | Status: DC | PRN

## 2023-03-07 MED ORDER — ACETAMINOPHEN 325 MG PO TABS
650.0000 mg | ORAL_TABLET | ORAL | Status: DC
  Administered 2023-03-07: 650 mg via ORAL
  Filled 2023-03-07: qty 2

## 2023-03-07 NOTE — Progress Notes (Signed)
PATIENT CARE CENTER NOTE    Diagnosis: Multiple Sclerosis  ( HCC) [G35]      Provider: Shon Millet MD     Procedure: Donnamarie Poag 300mg  infusion     Note: Patient received Tysabri infusion ( # 1 of 1) via PIV. Premedicated with Claritin and Tylenol PO per orders. Tolerated infusion well with no adverse reaction. Vital signs stable. Discharge instructions given. Patient to come back on 04/04/23 for next infusion. Patient observed for 1 hour post infusion. Alert, oriented and ambulatory with walker at discharge. Discharged home with Lutricia Feil.

## 2023-03-17 ENCOUNTER — Ambulatory Visit: Payer: MEDICAID | Admitting: Neurology

## 2023-03-21 ENCOUNTER — Ambulatory Visit: Payer: MEDICAID | Admitting: Neurology

## 2023-03-24 ENCOUNTER — Other Ambulatory Visit: Payer: Self-pay

## 2023-03-24 DIAGNOSIS — G35 Multiple sclerosis: Secondary | ICD-10-CM

## 2023-03-24 NOTE — Progress Notes (Signed)
Per Dr.jaffe okay to add order for Tysabri.

## 2023-03-31 ENCOUNTER — Ambulatory Visit
Admission: RE | Admit: 2023-03-31 | Discharge: 2023-03-31 | Disposition: A | Discharge status: Home / Self Care | Payer: MEDICAID | Source: Ambulatory Visit | Attending: Neurology

## 2023-03-31 ENCOUNTER — Ambulatory Visit
Admission: RE | Admit: 2023-03-31 | Discharge: 2023-03-31 | Disposition: A | Discharge status: Home / Self Care | Payer: MEDICAID | Source: Ambulatory Visit | Attending: Neurology | Admitting: Neurology

## 2023-03-31 DIAGNOSIS — G40909 Epilepsy, unspecified, not intractable, without status epilepticus: Secondary | ICD-10-CM

## 2023-03-31 DIAGNOSIS — G35 Multiple sclerosis: Secondary | ICD-10-CM

## 2023-03-31 DIAGNOSIS — G35D Multiple sclerosis, unspecified: Secondary | ICD-10-CM

## 2023-03-31 MED ORDER — GADOPICLENOL 0.5 MMOL/ML IV SOLN
10.0000 mL | Freq: Once | INTRAVENOUS | Status: AC | PRN
  Administered 2023-03-31: 10 mL via INTRAVENOUS

## 2023-04-04 ENCOUNTER — Non-Acute Institutional Stay (HOSPITAL_COMMUNITY)
Admission: RE | Admit: 2023-04-04 | Discharge: 2023-04-04 | Disposition: A | Discharge status: Home / Self Care | Payer: MEDICAID | Source: Ambulatory Visit | Attending: Internal Medicine | Admitting: Internal Medicine

## 2023-04-04 DIAGNOSIS — G35 Multiple sclerosis: Secondary | ICD-10-CM | POA: Insufficient documentation

## 2023-04-04 MED ORDER — ACETAMINOPHEN 325 MG PO TABS
650.0000 mg | ORAL_TABLET | ORAL | Status: DC
  Administered 2023-04-04: 650 mg via ORAL
  Filled 2023-04-04: qty 2

## 2023-04-04 MED ORDER — SODIUM CHLORIDE 0.9 % IV SOLN
300.0000 mg | INTRAVENOUS | Status: DC
  Administered 2023-04-04: 300 mg via INTRAVENOUS
  Filled 2023-04-04: qty 15

## 2023-04-04 MED ORDER — LORATADINE 10 MG PO TABS
10.0000 mg | ORAL_TABLET | ORAL | Status: DC
  Administered 2023-04-04: 10 mg via ORAL
  Filled 2023-04-04: qty 1

## 2023-04-04 MED ORDER — SODIUM CHLORIDE 0.9 % IV SOLN: INTRAVENOUS | Status: DC | PRN

## 2023-04-04 NOTE — Progress Notes (Signed)
PATIENT CARE CENTER NOTE  Diagnosis: Multiple sclerosis (HCC) [G35]    Provider: Shon Millet MD  Procedure: Tysabri 300 mg   Note: Patient received Tysabri infusion (dose #1 of 6). Pt pre-medicated with PO Tylenol and Claritin per orders. Pt tolerated infusion with no adverse reaction. Discharge instructions given. Pt is already scheduled for his next infusion on 05/02/2023. Pt is alert, oriented, and ambulatory with personal walker at discharge. Pt is discharged home with his Lutricia Feil.
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
## 2023-04-10 ENCOUNTER — Telehealth: Payer: Self-pay | Admitting: Neurology

## 2023-04-10 ENCOUNTER — Encounter: Payer: Self-pay | Admitting: Neurology

## 2023-04-10 ENCOUNTER — Other Ambulatory Visit (INDEPENDENT_AMBULATORY_CARE_PROVIDER_SITE_OTHER): Payer: MEDICAID

## 2023-04-10 ENCOUNTER — Ambulatory Visit (INDEPENDENT_AMBULATORY_CARE_PROVIDER_SITE_OTHER): Payer: MEDICAID | Admitting: Neurology

## 2023-04-10 VITALS — BP 123/68 | HR 73 | Ht 69.0 in | Wt 249.0 lb

## 2023-04-10 DIAGNOSIS — G40209 Localization-related (focal) (partial) symptomatic epilepsy and epileptic syndromes with complex partial seizures, not intractable, without status epilepticus: Secondary | ICD-10-CM | POA: Diagnosis not present

## 2023-04-10 DIAGNOSIS — G35 Multiple sclerosis: Secondary | ICD-10-CM

## 2023-04-10 LAB — COMPREHENSIVE METABOLIC PANEL
ALT: 14 U/L (ref 0–53)
AST: 15 U/L (ref 0–37)
Albumin: 4.1 g/dL (ref 3.5–5.2)
Alkaline Phosphatase: 40 U/L (ref 39–117)
BUN: 14 mg/dL (ref 6–23)
CO2: 31 meq/L (ref 19–32)
Calcium: 9.3 mg/dL (ref 8.4–10.5)
Chloride: 104 meq/L (ref 96–112)
Creatinine, Ser: 1.01 mg/dL (ref 0.40–1.50)
GFR: 99.12 mL/min (ref >60.00)
Glucose, Bld: 98 mg/dL (ref 70–99)
Potassium: 4.3 meq/L (ref 3.5–5.1)
Sodium: 143 meq/L (ref 135–145)
Total Bilirubin: 0.3 mg/dL (ref 0.2–1.2)
Total Protein: 6.8 g/dL (ref 6.0–8.3)

## 2023-04-10 LAB — VITAMIN D 25 HYDROXY (VIT D DEFICIENCY, FRACTURES): VITD: 44.85 ng/mL (ref 30.00–100.00)

## 2023-04-10 NOTE — Telephone Encounter (Signed)
Called uncle he understood and had no further questions

## 2023-04-10 NOTE — Telephone Encounter (Signed)
Pt's uncle called in stating the pt is taking 5000IU of Vitamin D.

## 2023-04-10 NOTE — Patient Instructions (Addendum)
Tysabri every 4 weeks Continue divaloproex 500mg  twice daily and levetiracetam 500mg  twice daily Look up dose of vitamin D3 and call us with the dose.  I want to increase the dose Check JCV virus index Check JCV index, vit D, CBC with diff and CMP in 6 months. Follow up in 6 months (after repeat labs)

## 2023-04-12 LAB — CBC WITH DIFFERENTIAL/PLATELET
Basophils Absolute: 0.1 10*3/uL (ref 0.0–0.2)
Basos: 1 %
EOS (ABSOLUTE): 0.7 10*3/uL — ABNORMAL HIGH (ref 0.0–0.4)
Eos: 9 %
Hematocrit: 46.8 % (ref 37.5–51.0)
Hemoglobin: 14.5 g/dL (ref 13.0–17.7)
Immature Grans (Abs): 0.2 10*3/uL — ABNORMAL HIGH (ref 0.0–0.1)
Immature Granulocytes: 3 %
Lymphocytes Absolute: 3.7 10*3/uL — ABNORMAL HIGH (ref 0.7–3.1)
Lymphs: 42 %
MCH: 26.5 pg — ABNORMAL LOW (ref 26.6–33.0)
MCHC: 31 g/dL — ABNORMAL LOW (ref 31.5–35.7)
MCV: 86 fL (ref 79–97)
Monocytes Absolute: 0.7 10*3/uL (ref 0.1–0.9)
Monocytes: 8 %
NRBC: 2 % — ABNORMAL HIGH (ref 0–0)
Neutrophils Absolute: 3.1 10*3/uL (ref 1.4–7.0)
Neutrophils: 37 %
Platelets: 143 10*3/uL — ABNORMAL LOW (ref 150–450)
RBC: 5.47 x10E6/uL (ref 4.14–5.80)
RDW: 14.3 % (ref 11.6–15.4)
WBC: 8.5 10*3/uL (ref 3.4–10.8)

## 2023-04-12 LAB — SPECIMEN STATUS REPORT

## 2023-04-17 LAB — STRATIFY JCV AB (W/ INDEX) W/ RFLX
Index Value: 0.34 — ABNORMAL HIGH
Stratify JCV (TM) Ab w/Reflex Inhibition: UNDETERMINED — AB

## 2023-04-17 LAB — RFLX STRATIFY JCV (TM) AB INHIBITION: JCV Antibody by Inhibition: NEGATIVE

## 2023-05-02 ENCOUNTER — Non-Acute Institutional Stay (HOSPITAL_COMMUNITY)
Admission: RE | Admit: 2023-05-02 | Discharge: 2023-05-02 | Disposition: A | Discharge status: Home / Self Care | Payer: MEDICAID | Source: Ambulatory Visit | Attending: Internal Medicine | Admitting: Internal Medicine

## 2023-05-02 DIAGNOSIS — G35 Multiple sclerosis: Secondary | ICD-10-CM | POA: Insufficient documentation

## 2023-05-02 MED ORDER — SODIUM CHLORIDE 0.9 % IV SOLN: INTRAVENOUS | Status: DC | PRN

## 2023-05-02 MED ORDER — ACETAMINOPHEN 325 MG PO TABS
650.0000 mg | ORAL_TABLET | Freq: Once | ORAL | Status: AC
  Administered 2023-05-02: 650 mg via ORAL
  Filled 2023-05-02: qty 2

## 2023-05-02 MED ORDER — LORATADINE 10 MG PO TABS
10.0000 mg | ORAL_TABLET | Freq: Every day | ORAL | Status: DC
  Administered 2023-05-02: 10 mg via ORAL
  Filled 2023-05-02: qty 1

## 2023-05-02 MED ORDER — SODIUM CHLORIDE 0.9 % IV SOLN
300.0000 mg | Freq: Once | INTRAVENOUS | Status: AC
  Administered 2023-05-02: 300 mg via INTRAVENOUS
  Filled 2023-05-02: qty 15

## 2023-05-02 NOTE — Progress Notes (Signed)
PATIENT CARE CENTER NOTE     Diagnosis: Multiple Sclerosis  ( HCC) [G35]      Provider: Shon Millet MD     Procedure: Tysabri 300mg  infusion     Note: Patient received Tysabri infusion ( dose # 2 of 6) via PIV. Patient pre-medicated with Claritin and Tylenol PO per orders. Tolerated infusion well with no adverse reaction. Vital signs stable. Discharge instructions given. Patient to come back on 05/30/23 for next infusion. Patient observed for 1 hour post infusion. Alert, oriented and ambulatory to wheelchair at discharge. Discharged home with Victor [name].

## 2023-05-30 ENCOUNTER — Non-Acute Institutional Stay (HOSPITAL_COMMUNITY)
Admission: RE | Admit: 2023-05-30 | Discharge: 2023-05-30 | Disposition: A | Discharge status: Home / Self Care | Payer: MEDICAID | Source: Ambulatory Visit | Attending: Internal Medicine | Admitting: Internal Medicine

## 2023-05-30 DIAGNOSIS — G35 Multiple sclerosis: Secondary | ICD-10-CM | POA: Insufficient documentation

## 2023-05-30 MED ORDER — SODIUM CHLORIDE 0.9 % IV SOLN: INTRAVENOUS | Status: DC | PRN

## 2023-05-30 MED ORDER — LORATADINE 10 MG PO TABS
10.0000 mg | ORAL_TABLET | Freq: Once | ORAL | Status: AC
  Administered 2023-05-30: 10 mg via ORAL
  Filled 2023-05-30: qty 1

## 2023-05-30 MED ORDER — ACETAMINOPHEN 325 MG PO TABS
650.0000 mg | ORAL_TABLET | Freq: Once | ORAL | Status: AC
  Administered 2023-05-30: 650 mg via ORAL
  Filled 2023-05-30: qty 2

## 2023-05-30 MED ORDER — SODIUM CHLORIDE 0.9 % IV SOLN
300.0000 mg | INTRAVENOUS | Status: DC
  Administered 2023-05-30: 300 mg via INTRAVENOUS
  Filled 2023-05-30: qty 15

## 2023-05-30 NOTE — Progress Notes (Signed)
PATIENT CARE CENTER NOTE  Diagnosis: Multiple Sclerosis  ( HCC) [G35]    Provider: Shon Millet, MD  Procedure: Tysabri 300 mg   Note: Patient received Tysabri 300 mg infusion (dose #3 of 6). Pt pre-medicated with PO Tylenol and Claritin per orders. Pt tolerated infusion with no adverse reaction. Vital signs are stable. Pts next infusion scheduled for 06/27/23. Pt observed for 1 hour post infusion. Pt is alert, oriented, and ambulatory with personal walker at discharge. Pt is discharged home with his Lutricia Feil.

## 2023-06-27 ENCOUNTER — Non-Acute Institutional Stay (HOSPITAL_COMMUNITY)
Admission: RE | Admit: 2023-06-27 | Discharge: 2023-06-27 | Disposition: A | Discharge status: Home / Self Care | Payer: MEDICAID | Source: Ambulatory Visit | Attending: Internal Medicine | Admitting: Internal Medicine

## 2023-06-27 DIAGNOSIS — G35 Multiple sclerosis: Secondary | ICD-10-CM | POA: Insufficient documentation

## 2023-06-27 MED ORDER — SODIUM CHLORIDE 0.9 % IV SOLN: INTRAVENOUS | Status: DC | PRN

## 2023-06-27 MED ORDER — SODIUM CHLORIDE 0.9 % IV SOLN
300.0000 mg | INTRAVENOUS | Status: DC
  Administered 2023-06-27: 300 mg via INTRAVENOUS
  Filled 2023-06-27: qty 15

## 2023-06-27 MED ORDER — ACETAMINOPHEN 325 MG PO TABS
650.0000 mg | ORAL_TABLET | Freq: Once | ORAL | Status: AC
  Administered 2023-06-27: 650 mg via ORAL
  Filled 2023-06-27: qty 2

## 2023-06-27 MED ORDER — LORATADINE 10 MG PO TABS
10.0000 mg | ORAL_TABLET | Freq: Once | ORAL | Status: AC
  Administered 2023-06-27: 10 mg via ORAL
  Filled 2023-06-27: qty 1

## 2023-06-27 NOTE — Progress Notes (Signed)
PATIENT CARE CENTER NOTE  Diagnosis: Multiple Sclerosis  ( HCC) [G35]    Provider: Shon Millet, MD  Procedure: Tysabri 300 mg  Note: Patient received Tysabri 300 mg infusion (dose #4 of 6). Pt pre-medicated with PO Tylenol and Claritin per orders. Pt tolerated infusion with no adverse reaction. Vital signs are stable. Pt observed for 1 hour post infusion. Pts next infusion is scheduled for 07/25/2023 at 830 AM. AVS printed and given to patient. Pt is alert, oriented, and ambulatory with personal walker at discharge. Pt is discharged home with his Lutricia Feil.

## 2023-07-25 ENCOUNTER — Non-Acute Institutional Stay (HOSPITAL_COMMUNITY)
Admission: RE | Admit: 2023-07-25 | Discharge: 2023-07-25 | Disposition: A | Discharge status: Home / Self Care | Payer: MEDICAID | Source: Ambulatory Visit | Attending: Internal Medicine | Admitting: Internal Medicine

## 2023-07-25 DIAGNOSIS — G35 Multiple sclerosis: Secondary | ICD-10-CM | POA: Insufficient documentation

## 2023-07-25 MED ORDER — ACETAMINOPHEN 325 MG PO TABS
650.0000 mg | ORAL_TABLET | Freq: Once | ORAL | Status: AC
  Administered 2023-07-25: 650 mg via ORAL
  Filled 2023-07-25: qty 2

## 2023-07-25 MED ORDER — SODIUM CHLORIDE 0.9 % IV SOLN
300.0000 mg | INTRAVENOUS | Status: DC
  Administered 2023-07-25: 300 mg via INTRAVENOUS
  Filled 2023-07-25: qty 15

## 2023-07-25 MED ORDER — SODIUM CHLORIDE 0.9 % IV SOLN: INTRAVENOUS | Status: DC | PRN

## 2023-07-25 MED ORDER — LORATADINE 10 MG PO TABS
10.0000 mg | ORAL_TABLET | Freq: Every day | ORAL | Status: DC
  Administered 2023-07-25: 10 mg via ORAL
  Filled 2023-07-25: qty 1

## 2023-07-25 NOTE — Progress Notes (Signed)
 PATIENT CARE CENTER NOTE  Diagnosis: Multiple Sclerosis  ( HCC) [G35]    Provider:  Juliene Dunnings, MD   Procedure: Tysabri  300 mg   Note: Pt received Tysabri  300 mg infusion (dose #5 of 6) via PIV. Pt pre-medicated per orders with PO Tylenol  and Claritin . Pt tolerated the infusion with no adverse reaction. Pt observed for 1 hour post infusion. Pts next infusion is scheduled for 08/22/23 at 8 AM. AVS printed and given to pt. Pt is alert, oriented, and ambulatory with personal walker at discharge. Pt discharged home with his uncle Maple.

## 2023-08-22 ENCOUNTER — Other Ambulatory Visit: Payer: Self-pay

## 2023-08-22 ENCOUNTER — Non-Acute Institutional Stay (HOSPITAL_COMMUNITY)
Admission: RE | Admit: 2023-08-22 | Discharge: 2023-08-22 | Disposition: A | Discharge status: Home / Self Care | Payer: MEDICAID | Source: Ambulatory Visit | Attending: Internal Medicine | Admitting: Internal Medicine

## 2023-08-22 DIAGNOSIS — G35 Multiple sclerosis: Secondary | ICD-10-CM | POA: Diagnosis present

## 2023-08-22 MED ORDER — LORATADINE 10 MG PO TABS
10.0000 mg | ORAL_TABLET | Freq: Once | ORAL | Status: AC
  Administered 2023-08-22: 10 mg via ORAL
  Filled 2023-08-22: qty 1

## 2023-08-22 MED ORDER — SODIUM CHLORIDE 0.9 % IV SOLN: INTRAVENOUS | Status: DC | PRN

## 2023-08-22 MED ORDER — ACETAMINOPHEN 325 MG PO TABS
650.0000 mg | ORAL_TABLET | Freq: Once | ORAL | Status: AC
  Administered 2023-08-22: 650 mg via ORAL
  Filled 2023-08-22: qty 2

## 2023-08-22 MED ORDER — SODIUM CHLORIDE 0.9 % IV SOLN
300.0000 mg | INTRAVENOUS | Status: DC
  Administered 2023-08-22: 300 mg via INTRAVENOUS
  Filled 2023-08-22: qty 15

## 2023-08-22 NOTE — Progress Notes (Signed)
PATIENT CARE CENTER NOTE  Diagnosis: Multiple Sclerosis  ( HCC) [G35]    Provider: Shon Millet, MD   Procedure: Tysabri 300 mg   Note: Patient received Tysabri 300 mg infusion (dose #6 of 6) via PIV. Pt pre-medicated per orders with PO Tylenol and Claritin. Pt tolerated the infusion with no adverse reaction. Pt observed for 1 hour post infusion.Vital signs are stable.AVS printed and given to pt. Pt is alert, oriented, and ambulatory with personal walker at discharge. Pts next infusion is scheduled for 09/20/2023 at 8 AM. Pt is discharged home with his Lutricia Feil.

## 2023-09-20 ENCOUNTER — Non-Acute Institutional Stay (HOSPITAL_COMMUNITY)
Admission: RE | Admit: 2023-09-20 | Discharge: 2023-09-20 | Disposition: A | Discharge status: Home / Self Care | Payer: MEDICAID | Source: Ambulatory Visit | Attending: Internal Medicine | Admitting: Internal Medicine

## 2023-09-20 DIAGNOSIS — G35 Multiple sclerosis: Secondary | ICD-10-CM | POA: Insufficient documentation

## 2023-09-20 MED ORDER — SODIUM CHLORIDE 0.9 % IV SOLN
300.0000 mg | INTRAVENOUS | Status: DC
  Administered 2023-09-20: 300 mg via INTRAVENOUS
  Filled 2023-09-20: qty 15

## 2023-09-20 MED ORDER — LORATADINE 10 MG PO TABS
10.0000 mg | ORAL_TABLET | ORAL | Status: DC
  Administered 2023-09-20: 10 mg via ORAL
  Filled 2023-09-20: qty 1

## 2023-09-20 MED ORDER — SODIUM CHLORIDE 0.9 % IV SOLN: INTRAVENOUS | Status: DC | PRN

## 2023-09-20 MED ORDER — ACETAMINOPHEN 325 MG PO TABS
650.0000 mg | ORAL_TABLET | Freq: Three times a day (TID) | ORAL | Status: DC
  Administered 2023-09-20: 650 mg via ORAL
  Filled 2023-09-20 (×5): qty 2

## 2023-09-20 NOTE — Progress Notes (Signed)
 PATIENT CARE CENTER NOTE   Diagnosis: Multiple Sclerosis ( HCC) [G35]      Provider: Shon Millet MD     Procedure: Tysabri 300mg  infusion     Note: Patient received Tysabri infusion ( dose #1 of 6) via PIV. Premedications Tylenol and Claritin PO given prior to infusion. Tolerated infusion well with no adverse reaction. Vital signs stable. Discharge instructions given. Patient observed for 1 hour post infusion, no s/s reaction noted. Pt to RTC  10/18/2023 for next infusion, verbalized understanding.  Alert, oriented and ambulatory with walker at discharge, accompanied by Lutricia Feil.
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
## 2023-10-09 ENCOUNTER — Encounter: Payer: Self-pay | Admitting: Neurology

## 2023-10-09 ENCOUNTER — Ambulatory Visit (INDEPENDENT_AMBULATORY_CARE_PROVIDER_SITE_OTHER): Payer: MEDICAID | Admitting: Neurology

## 2023-10-09 ENCOUNTER — Other Ambulatory Visit: Payer: MEDICAID

## 2023-10-09 VITALS — BP 125/64 | HR 75 | Ht 69.0 in | Wt 252.4 lb

## 2023-10-09 DIAGNOSIS — W3400XA Accidental discharge from unspecified firearms or gun, initial encounter: Secondary | ICD-10-CM

## 2023-10-09 DIAGNOSIS — G40209 Localization-related (focal) (partial) symptomatic epilepsy and epileptic syndromes with complex partial seizures, not intractable, without status epilepticus: Secondary | ICD-10-CM | POA: Diagnosis not present

## 2023-10-09 DIAGNOSIS — G35 Multiple sclerosis: Secondary | ICD-10-CM

## 2023-10-09 DIAGNOSIS — F259 Schizoaffective disorder, unspecified: Secondary | ICD-10-CM

## 2023-10-09 DIAGNOSIS — G40909 Epilepsy, unspecified, not intractable, without status epilepticus: Secondary | ICD-10-CM | POA: Diagnosis not present

## 2023-10-09 DIAGNOSIS — F209 Schizophrenia, unspecified: Secondary | ICD-10-CM

## 2023-10-09 MED ORDER — LEVETIRACETAM 500 MG PO TABS: 500.0000 mg | ORAL_TABLET | Freq: Two times a day (BID) | ORAL | 5 refills | Status: DC

## 2023-10-09 MED ORDER — DIVALPROEX SODIUM 500 MG PO DR TAB
500.0000 mg | DELAYED_RELEASE_TABLET | Freq: Two times a day (BID) | ORAL | 5 refills | Status: DC
Start: 2023-10-09 — End: 2024-04-10

## 2023-10-09 NOTE — Patient Instructions (Signed)
 Continue divalproex ER 500mg  twice daily and levetiracetam 500mg  twice daily Continue Tysabri every 4 weeks Continue vitamin D 7000 units daily Check today CBC with diff, CMP, vitamin D, and JC Virus antibody with index.  Repeat again in 6 months Follow up 6 months.

## 2023-10-10 LAB — COMPREHENSIVE METABOLIC PANEL
AG Ratio: 1.5 (calc) (ref 1.0–2.5)
ALT: 27 U/L (ref 9–46)
AST: 23 U/L (ref 10–40)
Albumin: 4.6 g/dL (ref 3.6–5.1)
Alkaline phosphatase (APISO): 43 U/L (ref 36–130)
BUN: 13 mg/dL (ref 7–25)
CO2: 28 mmol/L (ref 20–32)
Calcium: 9.8 mg/dL (ref 8.6–10.3)
Chloride: 104 mmol/L (ref 98–110)
Creat: 0.94 mg/dL (ref 0.60–1.26)
Globulin: 3.1 g/dL (ref 1.9–3.7)
Glucose, Bld: 77 mg/dL (ref 65–99)
Potassium: 4.5 mmol/L (ref 3.5–5.3)
Sodium: 141 mmol/L (ref 135–146)
Total Bilirubin: 0.3 mg/dL (ref 0.2–1.2)
Total Protein: 7.7 g/dL (ref 6.1–8.1)

## 2023-10-15 LAB — CBC WITH DIFFERENTIAL/PLATELET
Absolute Lymphocytes: 3783 {cells}/uL (ref 850–3900)
Absolute Monocytes: 890 {cells}/uL (ref 200–950)
Basophils Absolute: 134 {cells}/uL (ref 0–200)
Basophils Relative: 1.5 %
Eosinophils Absolute: 650 {cells}/uL — ABNORMAL HIGH (ref 15–500)
Eosinophils Relative: 7.3 %
HCT: 45.4 % (ref 38.5–50.0)
Hemoglobin: 14.5 g/dL (ref 13.2–17.1)
MCH: 26 pg — ABNORMAL LOW (ref 27.0–33.0)
MCHC: 31.9 g/dL — ABNORMAL LOW (ref 32.0–36.0)
MCV: 81.4 fL (ref 80.0–100.0)
MPV: 11.3 fL (ref 7.5–12.5)
Monocytes Relative: 10 %
Neutro Abs: 3444 {cells}/uL (ref 1500–7800)
Neutrophils Relative %: 38.7 %
Platelets: 156 10*3/uL (ref 140–400)
RBC: 5.58 10*6/uL (ref 4.20–5.80)
RDW: 14.2 % (ref 11.0–15.0)
Total Lymphocyte: 42.5 %
WBC: 8.9 10*3/uL (ref 3.8–10.8)

## 2023-10-15 LAB — COMPLETE METABOLIC PANEL WITH GFR
AG Ratio: 1.5 (calc) (ref 1.0–2.5)
ALT: 27 U/L (ref 9–46)
AST: 23 U/L (ref 10–40)
Albumin: 4.6 g/dL (ref 3.6–5.1)
Alkaline phosphatase (APISO): 43 U/L (ref 36–130)
BUN: 13 mg/dL (ref 7–25)
CO2: 28 mmol/L (ref 20–32)
Calcium: 9.8 mg/dL (ref 8.6–10.3)
Chloride: 104 mmol/L (ref 98–110)
Creat: 0.94 mg/dL (ref 0.60–1.26)
Globulin: 3.1 g/dL (ref 1.9–3.7)
Glucose, Bld: 77 mg/dL (ref 65–99)
Potassium: 4.5 mmol/L (ref 3.5–5.3)
Sodium: 141 mmol/L (ref 135–146)
Total Bilirubin: 0.3 mg/dL (ref 0.2–1.2)
Total Protein: 7.7 g/dL (ref 6.1–8.1)

## 2023-10-15 LAB — STRATIFY JCV AB (W/ INDEX) W/ RFLX
Index Value: 0.33 {index} — ABNORMAL HIGH
Stratify JCV (TM) Ab w/Reflex Inhibition: UNDETERMINED — AB

## 2023-10-15 LAB — VITAMIN D 25 HYDROXY (VIT D DEFICIENCY, FRACTURES): Vit D, 25-Hydroxy: 63 ng/mL (ref 30–100)

## 2023-10-15 LAB — RFLX STRATIFY JCV (TM) AB INHIBITION: JCV Antibody by Inhibition: NEGATIVE

## 2023-10-18 ENCOUNTER — Non-Acute Institutional Stay (HOSPITAL_COMMUNITY)
Admission: RE | Admit: 2023-10-18 | Discharge: 2023-10-18 | Disposition: A | Discharge status: Home / Self Care | Payer: MEDICAID | Source: Ambulatory Visit | Attending: Internal Medicine | Admitting: Internal Medicine

## 2023-10-18 DIAGNOSIS — G35 Multiple sclerosis: Secondary | ICD-10-CM | POA: Diagnosis present

## 2023-10-18 MED ORDER — ACETAMINOPHEN 325 MG PO TABS
650.0000 mg | ORAL_TABLET | Freq: Once | ORAL | Status: AC
  Administered 2023-10-18: 650 mg via ORAL
  Filled 2023-10-18: qty 2

## 2023-10-18 MED ORDER — SODIUM CHLORIDE 0.9 % IV SOLN
300.0000 mg | INTRAVENOUS | Status: DC
  Administered 2023-10-18: 300 mg via INTRAVENOUS
  Filled 2023-10-18: qty 15

## 2023-10-18 MED ORDER — LORATADINE 10 MG PO TABS
10.0000 mg | ORAL_TABLET | Freq: Every day | ORAL | Status: DC
  Administered 2023-10-18: 10 mg via ORAL
  Filled 2023-10-18: qty 1

## 2023-10-18 MED ORDER — SODIUM CHLORIDE 0.9 % IV SOLN: INTRAVENOUS | Status: DC | PRN

## 2023-10-18 NOTE — Progress Notes (Signed)
 PATIENT  CARE CENTER NOTE   Diagnosis: Multiple Sclerosis [facility]) [ G35]      Provider: Shon Millet MD     Procedure: Tysabri 300mg   infusion      Note: Patient received Tysabri infusion ( dose #2 of 6) via PIV.  Pt premedicated with Claritin and Tylenol PO per orders. Tolerated infusion well with no adverse reaction. Vital signs stable. Discharge instructions given, pt to RTC on 11/15/23. Patient observed for 1 hour post infusion, no s/s reaction noted.  Alert, oriented and ambulatory with walker at discharge, accompanied by Lutricia Feil.

## 2023-11-15 ENCOUNTER — Non-Acute Institutional Stay (HOSPITAL_COMMUNITY)
Admission: RE | Admit: 2023-11-15 | Discharge: 2023-11-15 | Disposition: A | Discharge status: Home / Self Care | Payer: MEDICAID | Source: Ambulatory Visit | Attending: Internal Medicine | Admitting: Internal Medicine

## 2023-11-15 DIAGNOSIS — G35 Multiple sclerosis: Secondary | ICD-10-CM | POA: Diagnosis present

## 2023-11-15 MED ORDER — SODIUM CHLORIDE 0.9 % IV SOLN: INTRAVENOUS | Status: DC | PRN

## 2023-11-15 MED ORDER — LORATADINE 10 MG PO TABS
10.0000 mg | ORAL_TABLET | Freq: Every day | ORAL | Status: DC
  Administered 2023-11-15: 10 mg via ORAL
  Filled 2023-11-15: qty 1

## 2023-11-15 MED ORDER — SODIUM CHLORIDE 0.9 % IV SOLN
300.0000 mg | INTRAVENOUS | Status: DC
  Administered 2023-11-15: 300 mg via INTRAVENOUS
  Filled 2023-11-15: qty 15

## 2023-11-15 MED ORDER — ACETAMINOPHEN 325 MG PO TABS
650.0000 mg | ORAL_TABLET | Freq: Once | ORAL | Status: AC
  Administered 2023-11-15: 650 mg via ORAL
  Filled 2023-11-15: qty 2

## 2023-11-15 NOTE — Progress Notes (Signed)
 PATIENT CENTER NOTE:   Diagnosis: Multiple Sclerosis ( HCC) [G35]      Provider:  Janne Members, MD     Procedure: Tysabri  300mg  infusion     Note: Patient received Tysabri  ( dose #3 of 6) infusion via PIV. Premedicated with Claritin  and Tylenol  PO per orders. Tolerated infusion well with no adverse reaction. Vital signs stable. Discharge instructions given, pt to RTC on 12/13/23 for next infusion , pt made aware and verbalized understanding. Patient observed for 1 hour post infusion, no s/s reaction noted. Alert, oriented and ambulatory with rolling walker at discharge, accompanied by Nelia Balzarine.

## 2023-12-13 ENCOUNTER — Non-Acute Institutional Stay (HOSPITAL_COMMUNITY)
Admission: RE | Admit: 2023-12-13 | Discharge: 2023-12-13 | Disposition: A | Discharge status: Home / Self Care | Payer: MEDICAID | Source: Ambulatory Visit | Attending: Internal Medicine | Admitting: Internal Medicine

## 2023-12-13 DIAGNOSIS — G35 Multiple sclerosis: Secondary | ICD-10-CM | POA: Diagnosis present

## 2023-12-13 MED ORDER — SODIUM CHLORIDE 0.9 % IV SOLN: INTRAVENOUS | Status: DC | PRN

## 2023-12-13 MED ORDER — LORATADINE 10 MG PO TABS
10.0000 mg | ORAL_TABLET | Freq: Once | ORAL | Status: AC
  Administered 2023-12-13: 10 mg via ORAL
  Filled 2023-12-13: qty 1

## 2023-12-13 MED ORDER — SODIUM CHLORIDE 0.9 % IV SOLN
300.0000 mg | INTRAVENOUS | Status: DC
  Administered 2023-12-13: 300 mg via INTRAVENOUS
  Filled 2023-12-13: qty 15

## 2023-12-13 MED ORDER — ACETAMINOPHEN 325 MG PO TABS
650.0000 mg | ORAL_TABLET | Freq: Once | ORAL | Status: AC
  Administered 2023-12-13: 650 mg via ORAL
  Filled 2023-12-13: qty 2

## 2023-12-13 NOTE — Progress Notes (Signed)
 PATIENT CARE CENTER NOTE  Diagnosis: Multiple Sclerosis ( HCC) [G35]    Provider: Janne Members, MD   Procedure: Tysabri  300 mg   Note: Patient received Tysabri  300 mg (dose #4 of 6) via PIV. Pt pre-medicated with PO Tylenol  and Claritin  per orders. Pt tolerated infusion well with no adverse reaction. Vital signs are stable. Discharge instructions printed and given to pt. Per orders pt should return in 28 days for next infusion, appointment is scheduled for June 18th at 8 AM. Pt is alert, oriented, and ambulatory with personal walker at discharge. Pt discharged home with [name].

## 2023-12-27 ENCOUNTER — Telehealth: Payer: Self-pay

## 2023-12-27 NOTE — Telephone Encounter (Signed)
 Touch update program Auth valid 01/24/24-07/24/24

## 2024-01-10 ENCOUNTER — Non-Acute Institutional Stay (HOSPITAL_COMMUNITY)
Admission: RE | Admit: 2024-01-10 | Discharge: 2024-01-10 | Disposition: A | Discharge status: Home / Self Care | Payer: MEDICAID | Source: Ambulatory Visit | Attending: Internal Medicine

## 2024-01-10 DIAGNOSIS — G35 Multiple sclerosis: Secondary | ICD-10-CM | POA: Diagnosis present

## 2024-01-10 MED ORDER — SODIUM CHLORIDE 0.9 % IV SOLN: INTRAVENOUS | Status: DC | PRN

## 2024-01-10 MED ORDER — ACETAMINOPHEN 325 MG PO TABS
650.0000 mg | ORAL_TABLET | Freq: Once | ORAL | Status: AC
  Administered 2024-01-10: 650 mg via ORAL
  Filled 2024-01-10: qty 2

## 2024-01-10 MED ORDER — SODIUM CHLORIDE 0.9 % IV SOLN
300.0000 mg | INTRAVENOUS | Status: DC
  Administered 2024-01-10: 300 mg via INTRAVENOUS
  Filled 2024-01-10: qty 15

## 2024-01-10 MED ORDER — LORATADINE 10 MG PO TABS
10.0000 mg | ORAL_TABLET | Freq: Every day | ORAL | Status: DC
  Administered 2024-01-10: 10 mg via ORAL
  Filled 2024-01-10: qty 1

## 2024-01-10 NOTE — Progress Notes (Signed)
 PATIENT CARE CENTER NOTE  Diagnosis: Multiple Sclerosis ( HCC) [G35]   Provider: Janne Members, MD   Procedure: Tysabri  300 mg infusion  Note: Patient received Tysabri  (dose #5 of 6). Pt pre-medicated with PO Tylenol  and Claritin  per orders. Pt tolerated the infusion well with no adverse reaction. Vital signs are stable. Pt observed for 1 hour post infusion. AVS printed and given to pt. Pt next appointment is scheduled for July 17th at 8 AM. Pt is alert, oriented, and ambulatory with personal walker at discharge. Pt discharged home with [name].

## 2024-02-08 ENCOUNTER — Non-Acute Institutional Stay (HOSPITAL_COMMUNITY)
Admission: RE | Admit: 2024-02-08 | Discharge: 2024-02-08 | Disposition: A | Discharge status: Home / Self Care | Payer: MEDICAID | Source: Ambulatory Visit | Attending: Internal Medicine | Admitting: Internal Medicine

## 2024-02-08 DIAGNOSIS — G35 Multiple sclerosis: Secondary | ICD-10-CM | POA: Insufficient documentation

## 2024-02-08 MED ORDER — ACETAMINOPHEN 325 MG PO TABS
650.0000 mg | ORAL_TABLET | Freq: Once | ORAL | Status: AC
  Administered 2024-02-08: 650 mg via ORAL
  Filled 2024-02-08: qty 2

## 2024-02-08 MED ORDER — SODIUM CHLORIDE 0.9 % IV SOLN: INTRAVENOUS | Status: DC | PRN

## 2024-02-08 MED ORDER — SODIUM CHLORIDE 0.9 % IV SOLN
300.0000 mg | INTRAVENOUS | Status: DC
  Administered 2024-02-08: 300 mg via INTRAVENOUS
  Filled 2024-02-08: qty 15

## 2024-02-08 MED ORDER — LORATADINE 10 MG PO TABS
10.0000 mg | ORAL_TABLET | Freq: Once | ORAL | Status: AC
  Administered 2024-02-08: 10 mg via ORAL
  Filled 2024-02-08: qty 1

## 2024-02-08 NOTE — Progress Notes (Signed)
 PATIENT CARE CENTER NOTE   Diagnosis: Multiple Sclerosis ( HCC) [G35]   Provider: Juliene Dunnings, MD   Procedure: Tysabri  300 mg infusion   Note: Patient received Tysabri  (dose #6 of 6). Patient pre-medicated with PO Tylenol  and Claritin  per orders. Patient tolerated the infusion well with no adverse reaction. Vital signs are stable. Observed patient for 1 hour post infusion. AVS printed and given to patient. Next appointment scheduled for 03/07/24. Patient is alert, oriented, and ambulatory with personal walker at discharge. Patient discharged home with [name].

## 2024-02-09 ENCOUNTER — Other Ambulatory Visit: Payer: Self-pay

## 2024-02-09 ENCOUNTER — Telehealth: Payer: Self-pay

## 2024-02-09 DIAGNOSIS — G35 Multiple sclerosis: Secondary | ICD-10-CM

## 2024-02-09 NOTE — Telephone Encounter (Signed)
 ERROR

## 2024-03-07 ENCOUNTER — Non-Acute Institutional Stay (HOSPITAL_COMMUNITY)
Admission: RE | Admit: 2024-03-07 | Discharge: 2024-03-07 | Disposition: A | Discharge status: Home / Self Care | Payer: MEDICAID | Source: Ambulatory Visit | Attending: Internal Medicine | Admitting: Internal Medicine

## 2024-03-07 DIAGNOSIS — G35 Multiple sclerosis: Secondary | ICD-10-CM | POA: Diagnosis present

## 2024-03-07 MED ORDER — ACETAMINOPHEN 325 MG PO TABS
650.0000 mg | ORAL_TABLET | ORAL | Status: DC
  Administered 2024-03-07: 650 mg via ORAL
  Filled 2024-03-07: qty 2

## 2024-03-07 MED ORDER — SODIUM CHLORIDE 0.9 % IV SOLN
300.0000 mg | INTRAVENOUS | Status: DC
  Administered 2024-03-07: 300 mg via INTRAVENOUS
  Filled 2024-03-07: qty 15

## 2024-03-07 MED ORDER — LORATADINE 10 MG PO TABS
10.0000 mg | ORAL_TABLET | ORAL | Status: DC
  Administered 2024-03-07: 10 mg via ORAL
  Filled 2024-03-07 (×2): qty 1

## 2024-03-07 MED ORDER — SODIUM CHLORIDE 0.9 % IV SOLN: INTRAVENOUS | Status: DC | PRN

## 2024-03-07 NOTE — Progress Notes (Signed)
 PATIENT CARE CENTER NOTE   Diagnosis: Multiple Sclerosis ( HCC) [G35]   Provider: Juliene Dunnings, MD   Procedure: Tysabri  300 mg infusion   Note: Patient received Tysabri  (dose # 1 of 6). Patient pre-medicated with PO Tylenol  and Claritin  per orders. Patient tolerated the infusion well with no adverse reaction. Vital signs are stable. Observed patient for 1 hour post infusion. AVS printed and given to patient. Next appointment scheduled for 04/04/24. Patient is alert, oriented, and ambulatory with personal walker at discharge. Patient discharged home with his Higinio Pipe.

## 2024-04-01 ENCOUNTER — Telehealth (HOSPITAL_COMMUNITY): Payer: Self-pay | Admitting: Pharmacy Technician

## 2024-04-01 NOTE — Telephone Encounter (Signed)
 Auth Submission: NO AUTH NEEDED Site of care: [facility] Payer: [employer] Medication & CPT/J Code(s) submitted: Tysabri  (Natalizumab ) G7676 Diagnosis Code: G35 Route of submission (phone, fax, portal):  Phone # Fax # Auth type: Buy/Bill PB Units/visits requested: 300mg  q 4 weeks Reference number: [id number] Approval from: 04/01/24 to 07/24/24    Dagoberto Armour, CPhT [facility] Phone: [phone] 04/01/2024

## 2024-04-03 ENCOUNTER — Other Ambulatory Visit: Payer: MEDICAID

## 2024-04-04 ENCOUNTER — Non-Acute Institutional Stay (HOSPITAL_COMMUNITY): Admission: RE | Admit: 2024-04-04 | Payer: MEDICAID | Source: Ambulatory Visit

## 2024-04-04 ENCOUNTER — Ambulatory Visit (HOSPITAL_COMMUNITY)
Admission: RE | Admit: 2024-04-04 | Discharge: 2024-04-04 | Disposition: A | Discharge status: Home / Self Care | Payer: MEDICAID | Source: Ambulatory Visit | Attending: Internal Medicine | Admitting: Internal Medicine

## 2024-04-04 VITALS — BP 126/78 | HR 71 | Temp 98.0°F | Resp 16

## 2024-04-04 DIAGNOSIS — G35 Multiple sclerosis: Secondary | ICD-10-CM | POA: Insufficient documentation

## 2024-04-04 MED ORDER — ACETAMINOPHEN 325 MG PO TABS
650.0000 mg | ORAL_TABLET | Freq: Once | ORAL | Status: AC
  Administered 2024-04-04: 650 mg via ORAL
  Filled 2024-04-04: qty 2

## 2024-04-04 MED ORDER — LORATADINE 10 MG PO TABS
10.0000 mg | ORAL_TABLET | Freq: Once | ORAL | Status: AC
  Administered 2024-04-04: 10 mg via ORAL
  Filled 2024-04-04: qty 1

## 2024-04-04 MED ORDER — SODIUM CHLORIDE 0.9 % IV SOLN
300.0000 mg | Freq: Once | INTRAVENOUS | Status: AC
  Administered 2024-04-04: 300 mg via INTRAVENOUS
  Filled 2024-04-04: qty 15

## 2024-04-04 MED ORDER — SODIUM CHLORIDE 0.9 % IV SOLN: INTRAVENOUS | Status: DC

## 2024-04-04 NOTE — Progress Notes (Signed)
 PATIENT CARE CENTER NOTE   Diagnosis: Multiple Sclerosis ( HCC) [G35]   Provider: Juliene Dunnings, MD   Procedure: Tysabri  300 mg infusion   Note: Patient received Tysabri  (dose # 1 of 5). Patient pre-medicated with PO Tylenol  and Claritin  per orders. Patient tolerated the infusion well with no adverse reaction. Vital signs are stable. Observed patient for 1 hour post infusion. AVS printed and given to patient. Next appointment scheduled for 05/02/24. Patient is alert, oriented, and ambulatory with personal walker at discharge. Patient discharged home with his Higinio Pipe.
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
## 2024-04-10 ENCOUNTER — Ambulatory Visit (INDEPENDENT_AMBULATORY_CARE_PROVIDER_SITE_OTHER): Payer: MEDICAID | Admitting: Neurology

## 2024-04-10 ENCOUNTER — Encounter: Payer: Self-pay | Admitting: Neurology

## 2024-04-10 VITALS — BP 143/81 | HR 95 | Ht 69.0 in | Wt 252.0 lb

## 2024-04-10 DIAGNOSIS — G40909 Epilepsy, unspecified, not intractable, without status epilepticus: Secondary | ICD-10-CM | POA: Diagnosis not present

## 2024-04-10 DIAGNOSIS — G40209 Localization-related (focal) (partial) symptomatic epilepsy and epileptic syndromes with complex partial seizures, not intractable, without status epilepticus: Secondary | ICD-10-CM | POA: Diagnosis not present

## 2024-04-10 DIAGNOSIS — W3400XA Accidental discharge from unspecified firearms or gun, initial encounter: Secondary | ICD-10-CM

## 2024-04-10 DIAGNOSIS — F209 Schizophrenia, unspecified: Secondary | ICD-10-CM

## 2024-04-10 DIAGNOSIS — F259 Schizoaffective disorder, unspecified: Secondary | ICD-10-CM

## 2024-04-10 DIAGNOSIS — G35 Multiple sclerosis: Secondary | ICD-10-CM

## 2024-04-10 LAB — CBC WITH DIFFERENTIAL/PLATELET
Absolute Lymphocytes: 3585 {cells}/uL (ref 850–3900)
Absolute Monocytes: 828 {cells}/uL (ref 200–950)
Basophils Absolute: 118 {cells}/uL (ref 0–200)
Basophils Relative: 1.3 %
Eosinophils Absolute: 701 {cells}/uL — ABNORMAL HIGH (ref 15–500)
Eosinophils Relative: 7.7 %
HCT: 44.9 % (ref 38.5–50.0)
Hemoglobin: 14.2 g/dL (ref 13.2–17.1)
MCH: 26.5 pg — ABNORMAL LOW (ref 27.0–33.0)
MCHC: 31.6 g/dL — ABNORMAL LOW (ref 32.0–36.0)
MCV: 83.9 fL (ref 80.0–100.0)
MPV: 11.4 fL (ref 7.5–12.5)
Monocytes Relative: 9.1 %
Neutro Abs: 3868 {cells}/uL (ref 1500–7800)
Neutrophils Relative %: 42.5 %
Platelets: 173 Thousand/uL (ref 140–400)
RBC: 5.35 Million/uL (ref 4.20–5.80)
RDW: 15.2 % — ABNORMAL HIGH (ref 11.0–15.0)
Total Lymphocyte: 39.4 %
WBC: 9.1 Thousand/uL (ref 3.8–10.8)

## 2024-04-10 LAB — COMPLETE METABOLIC PANEL WITHOUT GFR
AG Ratio: 1.6 (calc) (ref 1.0–2.5)
ALT: 31 U/L (ref 9–46)
AST: 21 U/L (ref 10–40)
Albumin: 4.5 g/dL (ref 3.6–5.1)
Alkaline phosphatase (APISO): 42 U/L (ref 36–130)
BUN: 14 mg/dL (ref 7–25)
CO2: 33 mmol/L — ABNORMAL HIGH (ref 20–32)
Calcium: 9.6 mg/dL (ref 8.6–10.3)
Chloride: 103 mmol/L (ref 98–110)
Creat: 1.16 mg/dL (ref 0.60–1.26)
Globulin: 2.9 g/dL (ref 1.9–3.7)
Glucose, Bld: 76 mg/dL (ref 65–99)
Potassium: 4.4 mmol/L (ref 3.5–5.3)
Sodium: 141 mmol/L (ref 135–146)
Total Bilirubin: 0.3 mg/dL (ref 0.2–1.2)
Total Protein: 7.4 g/dL (ref 6.1–8.1)

## 2024-04-10 LAB — STRATIFY JCV AB (W/ INDEX) W/ RFLX
Index Value: 0.23 {index} — ABNORMAL HIGH
Stratify JCV (TM) Ab w/Reflex Inhibition: UNDETERMINED — AB

## 2024-04-10 LAB — VITAMIN D 25 HYDROXY (VIT D DEFICIENCY, FRACTURES): Vit D, 25-Hydroxy: 63 ng/mL (ref 30–100)

## 2024-04-10 LAB — RFLX STRATIFY JCV (TM) AB INHIBITION: JCV Antibody by Inhibition: NEGATIVE

## 2024-04-10 MED ORDER — DIVALPROEX SODIUM 500 MG PO DR TAB: 500.0000 mg | DELAYED_RELEASE_TABLET | Freq: Two times a day (BID) | ORAL | 5 refills | Status: AC

## 2024-04-10 MED ORDER — LEVETIRACETAM 500 MG PO TABS: 500.0000 mg | ORAL_TABLET | Freq: Two times a day (BID) | ORAL | 5 refills | Status: AC

## 2024-04-10 NOTE — Patient Instructions (Addendum)
 Continue Tysabri  IV every 4 weeks Take divalproex  500mg  DR tablet TWICE DAILY Take levetiracetam  500mg  tablet TWICE DAILY Check labs in 6 months:  CBC, CMP, vit D, JCV antibody with index Check MRI of brain with and without contrast in 6 months Follow up in 6 months (after repeat tests)

## 2024-05-02 ENCOUNTER — Ambulatory Visit (HOSPITAL_COMMUNITY)
Admission: RE | Admit: 2024-05-02 | Discharge: 2024-05-02 | Disposition: A | Discharge status: Home / Self Care | Payer: MEDICAID | Source: Ambulatory Visit | Attending: Internal Medicine | Admitting: Internal Medicine

## 2024-05-02 VITALS — BP 141/72 | HR 80 | Temp 97.8°F | Resp 18

## 2024-05-02 DIAGNOSIS — G35D Multiple sclerosis, unspecified: Secondary | ICD-10-CM | POA: Diagnosis present

## 2024-05-02 MED ORDER — ACETAMINOPHEN 325 MG PO TABS
650.0000 mg | ORAL_TABLET | Freq: Once | ORAL | Status: AC
  Administered 2024-05-02: 650 mg via ORAL
  Filled 2024-05-02: qty 2

## 2024-05-02 MED ORDER — LORATADINE 10 MG PO TABS
10.0000 mg | ORAL_TABLET | Freq: Once | ORAL | Status: AC
  Administered 2024-05-02: 10 mg via ORAL
  Filled 2024-05-02: qty 1

## 2024-05-02 MED ORDER — SODIUM CHLORIDE 0.9 % IV SOLN: INTRAVENOUS | Status: DC

## 2024-05-02 MED ORDER — SODIUM CHLORIDE 0.9 % IV SOLN
300.0000 mg | Freq: Once | INTRAVENOUS | Status: AC
  Administered 2024-05-02: 300 mg via INTRAVENOUS
  Filled 2024-05-02: qty 15

## 2024-05-02 NOTE — Progress Notes (Signed)
 Diagnosis: multiple Sclerosis    Provider: Juliene Dunnings, MD    Procedure:Tysabri  300 mg 4 of 5 doses.         \Note: patient pre-medicated with PO Tylenol  and Claitin per orders. Patient tolerated the indusion well with no adverse reaction.  Vital signs stable .  Observed for 1 hour post op infusion. AVS printed and given to Maple ( pt guardian) with his next appt scheduled.  Patient is alert and oriented , ambulatory with walker upon discharge.

## 2024-05-30 ENCOUNTER — Ambulatory Visit (HOSPITAL_COMMUNITY): Payer: MEDICAID

## 2024-05-30 ENCOUNTER — Encounter (HOSPITAL_COMMUNITY): Payer: MEDICAID

## 2024-06-06 ENCOUNTER — Ambulatory Visit (HOSPITAL_COMMUNITY)
Admission: RE | Admit: 2024-06-06 | Discharge: 2024-06-06 | Disposition: A | Discharge status: Home / Self Care | Payer: MEDICAID | Source: Ambulatory Visit | Attending: Internal Medicine | Admitting: Internal Medicine

## 2024-06-06 VITALS — BP 129/79 | HR 92 | Temp 97.8°F | Resp 16

## 2024-06-06 DIAGNOSIS — G35D Multiple sclerosis, unspecified: Secondary | ICD-10-CM | POA: Diagnosis present

## 2024-06-06 MED ORDER — ACETAMINOPHEN 325 MG PO TABS
650.0000 mg | ORAL_TABLET | Freq: Once | ORAL | Status: AC
  Administered 2024-06-06: 650 mg via ORAL
  Filled 2024-06-06: qty 2

## 2024-06-06 MED ORDER — LORATADINE 10 MG PO TABS
10.0000 mg | ORAL_TABLET | Freq: Once | ORAL | Status: AC
  Administered 2024-06-06: 10 mg via ORAL
  Filled 2024-06-06: qty 1

## 2024-06-06 MED ORDER — SODIUM CHLORIDE 0.9 % IV SOLN: INTRAVENOUS | Status: DC

## 2024-06-06 MED ORDER — SODIUM CHLORIDE 0.9 % IV SOLN
300.0000 mg | Freq: Once | INTRAVENOUS | Status: AC
  Administered 2024-06-06: 300 mg via INTRAVENOUS
  Filled 2024-06-06: qty 15

## 2024-06-06 NOTE — Progress Notes (Signed)
 PATIENT CARE CENTER NOTE   Diagnosis: Multiple Sclerosis       Provider: Skeet Juliene SAUNDERS, DO     Procedure: Tysabri  IV     Note: Patient received Tysabri  infusion via PIV (3 of 5). Tolerated infusion well with no adverse reaction. Vital signs stable. Discharge instructions given. Patient stayed for the 1 hour post-infusion observation, no reactions observed. Alert, oriented and ambulatory with walker at discharge. Picked up by family member.

## 2024-07-04 ENCOUNTER — Ambulatory Visit (HOSPITAL_COMMUNITY)
Admission: RE | Admit: 2024-07-04 | Discharge: 2024-07-04 | Disposition: A | Discharge status: Home / Self Care | Payer: MEDICAID | Source: Ambulatory Visit | Attending: Internal Medicine | Admitting: Internal Medicine

## 2024-07-04 VITALS — BP 131/79 | HR 89 | Temp 98.1°F | Resp 16

## 2024-07-04 DIAGNOSIS — G35D Multiple sclerosis, unspecified: Secondary | ICD-10-CM | POA: Insufficient documentation

## 2024-07-04 MED ORDER — SODIUM CHLORIDE 0.9 % IV SOLN: INTRAVENOUS | Status: DC

## 2024-07-04 MED ADMIN — Acetaminophen Tab 325 MG: 650 mg | ORAL | NDC 00904677361

## 2024-07-04 MED ADMIN — Loratadine Tab 10 MG: 10 mg | ORAL | NDC 68084024811

## 2024-07-04 MED ADMIN — NATALIZUMAB 300 MG IV: 300 mg | INTRAVENOUS | NDC 64406000801

## 2024-07-04 MED FILL — Acetaminophen Tab 325 MG: 650.0000 mg | ORAL | Qty: 2 | Status: AC

## 2024-07-04 MED FILL — Natalizumab for IV Inj Conc 300 MG/15ML: 300.0000 mg | INTRAVENOUS | Qty: 15 | Status: AC

## 2024-07-04 MED FILL — Loratadine Tab 10 MG: 10.0000 mg | ORAL | Qty: 1 | Status: AC

## 2024-07-04 NOTE — Progress Notes (Signed)
 Diagnosis: multiple Sclerosis (HCC (35)    Provider: Juliene Dunnings, MD    Procedure: Tysrbri 300 mg Infusion    Note: Patient received .  Patient pre medicated with PO tylenol   and Claritin  per orders.  Patient tolerated the indusion well with no adverse reaction.  Vital signs are stable.  Observed patient for one hour post infusion for observation.  Patient alert, oriented and ambulatory with his walker per his norm upon discharge.  Patient's uncle Cyndee picked him up, AVS given to uncle and next appt set up at checkout.

## 2024-07-26 ENCOUNTER — Telehealth (HOSPITAL_COMMUNITY): Payer: Self-pay

## 2024-07-26 NOTE — Telephone Encounter (Addendum)
 Auth Submission: NO AUTH NEEDED Site of care: Site of care: [facility] Payer: [employer] Medication & CPT/J Code(s) submitted: Tysabri  (Natalizumab ) G7676 Diagnosis Code:G35.D  Route of submission (phone, fax, portal): phone Phone #[phone] Fax # Auth type: Buy/Bill HB Units/visits requested: 300mg  q4weeks Reference number: [id number] Approval from: 07/25/24 to 07/24/25

## 2024-08-05 ENCOUNTER — Ambulatory Visit (HOSPITAL_COMMUNITY)
Admission: RE | Admit: 2024-08-05 | Discharge: 2024-08-05 | Disposition: A | Discharge status: Home / Self Care | Payer: MEDICAID | Source: Ambulatory Visit | Attending: Internal Medicine | Admitting: Internal Medicine

## 2024-08-05 VITALS — BP 125/78 | HR 76 | Temp 97.8°F | Resp 16

## 2024-08-05 DIAGNOSIS — G35D Multiple sclerosis, unspecified: Secondary | ICD-10-CM | POA: Diagnosis present

## 2024-08-05 MED ORDER — SODIUM CHLORIDE 0.9 % IV SOLN
300.0000 mg | Freq: Once | INTRAVENOUS | Status: AC
  Administered 2024-08-05: 300 mg via INTRAVENOUS
  Filled 2024-08-05: qty 15

## 2024-08-05 MED ORDER — LORATADINE 10 MG PO TABS
10.0000 mg | ORAL_TABLET | Freq: Once | ORAL | Status: AC
  Administered 2024-08-05: 10 mg via ORAL
  Filled 2024-08-05: qty 1

## 2024-08-05 MED ORDER — SODIUM CHLORIDE 0.9 % IV SOLN: INTRAVENOUS | Status: DC

## 2024-08-05 MED ORDER — ACETAMINOPHEN 325 MG PO TABS
650.0000 mg | ORAL_TABLET | Freq: Once | ORAL | Status: AC
  Administered 2024-08-05: 650 mg via ORAL
  Filled 2024-08-05: qty 2

## 2024-08-05 NOTE — Progress Notes (Addendum)
"                      PATIENT CARE CENTER NOTE   Diagnosis: Multiple Sclerosis ( HCC) [G35]   Provider: Juliene Dunnings, MD   Procedure: Tysabri  300 mg infusion   Note: Patient received Tysabri  300 mg infusion via PIV. Patient pre-medicated with PO Tylenol  and Claritin  per orders. Patient tolerated the infusion well with no adverse reaction. Vital signs are stable. Observed patient for 1 hour post infusion. AVS printed and given to patient. Next appointment scheduled for 09/02/24. Patient is alert, oriented, and ambulatory with personal walker at discharge. Patient discharged home with his Higinio Pipe. "

## 2024-08-30 ENCOUNTER — Other Ambulatory Visit (HOSPITAL_COMMUNITY): Payer: Self-pay | Admitting: Neurology

## 2024-08-30 NOTE — Progress Notes (Signed)
 Per Dr. Skeet, can renew Tysabri  orders  Last OV Sept 2025, next OV is scheduled for March 2025  Premeds: acetaminophen  650mg  p.o. and loratidine 10mg  p.o.  Tysabri  300mg  IVPB every 4 weeks  SOC: [facility]  Sherry Pennant, PharmD, MPH, BCPS, CPP Clinical Pharmacist

## 2024-09-02 ENCOUNTER — Inpatient Hospital Stay (HOSPITAL_COMMUNITY): Admission: RE | Admit: 2024-09-02 | Payer: MEDICAID | Source: Ambulatory Visit

## 2024-10-08 ENCOUNTER — Other Ambulatory Visit: Payer: MEDICAID

## 2024-10-15 ENCOUNTER — Other Ambulatory Visit: Payer: MEDICAID

## 2024-10-22 ENCOUNTER — Ambulatory Visit: Payer: MEDICAID | Admitting: Neurology
